# Patient Record
Sex: Female | Born: 1945 | Race: Black or African American | Hispanic: No | Marital: Single | Smoking: Never smoker
Health system: Southern US, Community
[De-identification: ages and names within clinical notes are randomized; demographics above are authoritative.]

## PROBLEM LIST (undated history)

## (undated) DIAGNOSIS — R569 Unspecified convulsions: Secondary | ICD-10-CM

## (undated) DIAGNOSIS — F79 Unspecified intellectual disabilities: Secondary | ICD-10-CM

## (undated) HISTORY — PX: NO PAST SURGERIES: SHX2092

---

## 2003-08-09 ENCOUNTER — Other Ambulatory Visit: Payer: Self-pay

## 2009-01-01 ENCOUNTER — Emergency Department: Payer: Self-pay | Admitting: Emergency Medicine

## 2011-01-02 ENCOUNTER — Emergency Department: Payer: Self-pay | Admitting: Emergency Medicine

## 2013-05-20 ENCOUNTER — Other Ambulatory Visit: Payer: Self-pay

## 2013-05-20 ENCOUNTER — Encounter (HOSPITAL_COMMUNITY): Payer: Self-pay | Admitting: Emergency Medicine

## 2013-05-20 ENCOUNTER — Emergency Department (HOSPITAL_COMMUNITY)
Admission: EM | Admit: 2013-05-20 | Discharge: 2013-05-20 | Disposition: A | Discharge status: Home / Self Care | Payer: Medicare Other | Attending: Emergency Medicine | Admitting: Emergency Medicine

## 2013-05-20 ENCOUNTER — Emergency Department (HOSPITAL_COMMUNITY): Payer: Medicare Other

## 2013-05-20 DIAGNOSIS — Z8669 Personal history of other diseases of the nervous system and sense organs: Secondary | ICD-10-CM | POA: Insufficient documentation

## 2013-05-20 DIAGNOSIS — S8990XA Unspecified injury of unspecified lower leg, initial encounter: Secondary | ICD-10-CM | POA: Insufficient documentation

## 2013-05-20 DIAGNOSIS — I440 Atrioventricular block, first degree: Secondary | ICD-10-CM | POA: Insufficient documentation

## 2013-05-20 DIAGNOSIS — R569 Unspecified convulsions: Secondary | ICD-10-CM | POA: Insufficient documentation

## 2013-05-20 DIAGNOSIS — R220 Localized swelling, mass and lump, head: Secondary | ICD-10-CM | POA: Insufficient documentation

## 2013-05-20 DIAGNOSIS — F79 Unspecified intellectual disabilities: Secondary | ICD-10-CM | POA: Insufficient documentation

## 2013-05-20 DIAGNOSIS — G9389 Other specified disorders of brain: Secondary | ICD-10-CM | POA: Insufficient documentation

## 2013-05-20 DIAGNOSIS — Y92009 Unspecified place in unspecified non-institutional (private) residence as the place of occurrence of the external cause: Secondary | ICD-10-CM | POA: Insufficient documentation

## 2013-05-20 DIAGNOSIS — S99929A Unspecified injury of unspecified foot, initial encounter: Principal | ICD-10-CM

## 2013-05-20 DIAGNOSIS — M79605 Pain in left leg: Secondary | ICD-10-CM

## 2013-05-20 DIAGNOSIS — M2669 Other specified disorders of temporomandibular joint: Secondary | ICD-10-CM | POA: Insufficient documentation

## 2013-05-20 DIAGNOSIS — R7889 Finding of other specified substances, not normally found in blood: Secondary | ICD-10-CM

## 2013-05-20 DIAGNOSIS — S99919A Unspecified injury of unspecified ankle, initial encounter: Principal | ICD-10-CM

## 2013-05-20 DIAGNOSIS — I7090 Unspecified atherosclerosis: Secondary | ICD-10-CM | POA: Insufficient documentation

## 2013-05-20 DIAGNOSIS — R221 Localized swelling, mass and lump, neck: Secondary | ICD-10-CM

## 2013-05-20 DIAGNOSIS — Y939 Activity, unspecified: Secondary | ICD-10-CM | POA: Insufficient documentation

## 2013-05-20 DIAGNOSIS — R892 Abnormal level of other drugs, medicaments and biological substances in specimens from other organs, systems and tissues: Secondary | ICD-10-CM | POA: Insufficient documentation

## 2013-05-20 DIAGNOSIS — W19XXXA Unspecified fall, initial encounter: Secondary | ICD-10-CM | POA: Insufficient documentation

## 2013-05-20 HISTORY — DX: Unspecified intellectual disabilities: F79

## 2013-05-20 HISTORY — DX: Unspecified convulsions: R56.9

## 2013-05-20 LAB — URINALYSIS, ROUTINE W REFLEX MICROSCOPIC
Bilirubin Urine: NEGATIVE
GLUCOSE, UA: NEGATIVE mg/dL
Ketones, ur: NEGATIVE mg/dL
Leukocytes, UA: NEGATIVE
Nitrite: NEGATIVE
Protein, ur: NEGATIVE mg/dL
UROBILINOGEN UA: 0.2 mg/dL (ref 0.0–1.0)
pH: 5.5 (ref 5.0–8.0)

## 2013-05-20 LAB — CBC
HEMATOCRIT: 35.8 % — AB (ref 36.0–46.0)
HEMOGLOBIN: 12 g/dL (ref 12.0–15.0)
MCH: 30.2 pg (ref 26.0–34.0)
MCHC: 33.5 g/dL (ref 30.0–36.0)
MCV: 89.9 fL (ref 78.0–100.0)
Platelets: 193 10*3/uL (ref 150–400)
RBC: 3.98 MIL/uL (ref 3.87–5.11)
RDW: 13.7 % (ref 11.5–15.5)
WBC: 6.1 10*3/uL (ref 4.0–10.5)

## 2013-05-20 LAB — PHENYTOIN LEVEL, TOTAL: PHENYTOIN LVL: 26.5 ug/mL — AB (ref 10.0–20.0)

## 2013-05-20 LAB — BASIC METABOLIC PANEL
BUN: 18 mg/dL (ref 6–23)
CHLORIDE: 103 meq/L (ref 96–112)
CO2: 26 mEq/L (ref 19–32)
Calcium: 9.1 mg/dL (ref 8.4–10.5)
Creatinine, Ser: 0.56 mg/dL (ref 0.50–1.10)
Glucose, Bld: 122 mg/dL — ABNORMAL HIGH (ref 70–99)
POTASSIUM: 3.9 meq/L (ref 3.7–5.3)
SODIUM: 143 meq/L (ref 137–147)

## 2013-05-20 LAB — URINE MICROSCOPIC-ADD ON

## 2013-05-20 NOTE — Discharge Instructions (Signed)
Please have Mariah Jimenez take her Dilantin tonight but instead of her usual dose, please give her 100mg  of dilantin instead.  She can take her normal amount of phenobarbital.  Mariah Jimenez also needs to drink more fluids as she does appear slightly dehydrated  You should return to the ER immediately for severe or worsening symptoms.  [facility] [place] [facility]    Kari BaarsEdward Hawkins MD. Specialty: Pulmonary Disease Contact information: [address]  [address]  [place] [address]  [phone]   Syliva OvermanMargaret Simpson, MD. Specialty: [facility] Contact information: [address]  [place] [address]  [phone]   Lilyan PuntScott Luking, MD. Specialty: [facility] Contact information: [address]  [address]  [place] [address]  [phone]   Avon Gullyesfaye Fanta, MD Specialty: Internal Medicine Contact information: [address]  [place] [address]  [phone]   Catalina PizzaZach Hall, MD. Specialty: Internal Medicine Contact information: [address]  [place] [address]  [phone]   Butch PennyAngus Mcinnis, MD. Specialty: Family Medicine Contact information: [address]  [place] [address]  [phone]   John GiovanniStephen Knowlton, MD. Specialty: [facility] Contact information: [address]  [address]  [place] [address]  [phone]   Carylon Perchesoy Fagan, MD. Specialty: Internal Medicine Contact information: [address]  [address]  [place] [address]  [phone]

## 2013-05-20 NOTE — ED Notes (Signed)
Found on floor at home  c/o L leg pain by sister.  Hx of epilepsy and mentally handicapped.

## 2013-05-20 NOTE — ED Provider Notes (Signed)
CSN: [id number]     Arrival date & time 05/20/13  0246 History   First MD Initiated Contact with Patient 05/20/13 0248     Chief Complaint  Patient presents with  . Fall  . 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 She was found on the floor by family members, there is no family at the time of the patient's arrival, the patient is unable to verbalize what happened, does not even answer yes or no to questions. Possibly with left leg pain the patient is unable to voice any concerns at this time.  Level 5 caveat applies secondary to mental retardation   Patient is a 68 y.o. female presenting with fall and leg pain. The history is provided by the EMS personnel.  Fall  Leg Pain   Past Medical History  Diagnosis Date  . Mental retardation   . Seizures    No past surgical history on file. No family history on file. History  Substance Use Topics  . Smoking status: Not on file  . Smokeless tobacco: Not on file  . Alcohol Use: Not on file   OB History   Grav Para Term Preterm Abortions TAB SAB Ect Mult Living                 Review of Systems  Unable to perform ROS: Other    Allergies  Review of patient's allergies indicates not on file.  Home Medications  No current outpatient prescriptions on file. BP 134/59  Pulse 103  Temp(Src) 97.5 F (36.4 C) (Oral)  Resp 18  Ht 5\' 7"  (1.702 m)  Wt 250 lb (113.399 kg)  BMI 39.15 kg/m2  SpO2 97% Physical Exam  Nursing note and vitals reviewed. Constitutional: She appears well-developed and well-nourished. No distress.  HENT:  Head: Normocephalic and atraumatic.  Mouth/Throat: Oropharynx is clear and moist. No oropharyngeal exudate.  Edentulous, no intraoral bleeding or lesions visualized, no obvious trauma to the face or the scalp, no palpable hematomas or  visualized contusions, no raccoon eyes, no battle sign  Eyes: Conjunctivae and EOM are normal. Pupils are equal, round, and reactive to light. Right eye exhibits no discharge. Left eye exhibits no discharge. No scleral icterus.  Neck: Normal range of motion. Neck supple. No JVD present. No thyromegaly present.  Cardiovascular: Normal rate, regular rhythm, normal heart sounds and intact distal pulses.  Exam reveals no gallop and no friction rub.   No murmur heard. Pulmonary/Chest: Effort normal and breath sounds normal. No respiratory distress. She has no wheezes. She has no rales.  Abdominal: Soft. Bowel sounds are normal. She exhibits no distension and no mass. There is no tenderness.  Musculoskeletal: Normal range of motion. She exhibits no edema and no tenderness.  Slight discrepancy in leg length left shorter than the right, the patient is able to straight leg raise bilaterally with no difficulty symmetrically. No pain with rotation or flexion of the left hip or knee, no obvious injuries to the bilateral upper extremities, compartments are soft, joints are supple  Lymphadenopathy:    She has no cervical adenopathy.  Neurological: She is alert. Coordination normal.  Follows commands, able to move all 4 extremities without difficulty, the patient mumbles occasional words which are inaudible  Skin: Skin is warm and dry. No rash noted. No erythema.  Psychiatric: She has a normal mood and affect. Her behavior is normal.  ED Course  Procedures (including critical care time) Labs Review Labs Reviewed  PHENYTOIN LEVEL, TOTAL - Abnormal; Notable for the following:    Phenytoin Lvl 26.5 (*)    All other components within normal limits  BASIC METABOLIC PANEL - Abnormal; Notable for the following:    Glucose, Bld 122 (*)    All other components within normal limits  CBC - Abnormal; Notable for the following:    HCT 35.8 (*)    All other components within normal limits  URINALYSIS, ROUTINE W  REFLEX MICROSCOPIC - Abnormal; Notable for the following:    APPearance HAZY (*)    Specific Gravity, Urine >1.030 (*)    Hgb urine dipstick MODERATE (*)    All other components within normal limits  URINE MICROSCOPIC-ADD ON - Abnormal; Notable for the following:    Squamous Epithelial / LPF MANY (*)    Bacteria, UA MANY (*)    All other components within normal limits   Imaging Review Dg Hip Complete Left  05/20/2013   CLINICAL DATA:  Found down, hip pain.  EXAM: LEFT HIP - COMPLETE 2+ VIEW  COMPARISON:  None available for comparison at time of study interpretation.  FINDINGS: Femoral heads are well formed and located. Hip joint spaces are intact. Sacroiliac joints are symmetric.  No destructive bony lesions. Included soft tissue planes are non-suspicious.  IMPRESSION: No fracture deformity or dislocation.   Electronically Signed   By: Awilda Metro   On: 05/20/2013 04:27   Ct Head Wo Contrast  05/20/2013   CLINICAL DATA:  Found on floor, left hip pain and headache. History of seizures and mental retardation.  EXAM: CT HEAD WITHOUT CONTRAST  CT CERVICAL SPINE WITHOUT CONTRAST  TECHNIQUE: Multidetector CT imaging of the head and cervical spine was performed following the standard protocol without intravenous contrast. Multiplanar CT image reconstructions of the cervical spine were also generated.  COMPARISON:  None available for comparison at time of study interpretation.  FINDINGS: CT HEAD FINDINGS  Prominent fourth ventricle, with commensurate enlargement of cerebellar folia consistent with global cerebellar volume loss. Supratentorial ventricles and sulci are normal for patient's age. No intraparenchymal hemorrhage, mass effect, midline shift or acute large vascular territory infarct. Minimal white matter changes are less than expected for age of the nonspecific may reflect chronic small vessel ischemic disease.  Basal cisterns are patent. Moderate calcific atherosclerosis of the carotid siphons.  Trace paranasal sinus mucosal thickening without air-fluid levels. Mastoid air cells are well aerated. Moderate left temporomandibular osteoarthrosis. Patient is osteopenic. Ocular globes and orbital contents are nonsuspicious though not tailored for evaluation. No skull fracture. Diffuse calvarial thickening without skull fracture.  CT CERVICAL SPINE FINDINGS  Cervical vertebral bodies and posterior elements appear intact and aligned with straightened cervical lordosis. Mild lower cervical levoscoliosis. Bridging C4-5 and C5-6 ventral osteophyte with fusion of the right C4-5 facets which may be degenerative. C1-2 articulation maintained with moderate arthropathy. No destructive bony lesions. Mild calcific atherosclerosis of the carotid bulbs. Right parasagittal 25 x 18 mm cystic mass within the anterior neck which appears to arise from the right infrahyoid strap muscles, not included on the reformations.  IMPRESSION: CT head:  No acute intracranial process.  Cerebellar volume loss in addition to calvarial thickening suggest sequelae of long-term anti seizure medication.  CT cervical spine: Straightened cervical lordosis without acute fracture nor malalignment. 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   Electronically Signed   By: Awilda Metro   On: 05/20/2013 05:26   Ct Cervical Spine Wo Contrast  05/20/2013   CLINICAL DATA:  Found on floor, left hip pain and headache. History of seizures and mental retardation.  EXAM: CT HEAD WITHOUT CONTRAST  CT CERVICAL SPINE WITHOUT CONTRAST  TECHNIQUE: Multidetector CT imaging of the head and cervical spine was performed following the standard protocol without intravenous contrast. Multiplanar CT image reconstructions of the cervical spine were also generated.  COMPARISON:  None available for comparison at time of study  interpretation.  FINDINGS: CT HEAD FINDINGS  Prominent fourth ventricle, with commensurate enlargement of cerebellar folia consistent with global cerebellar volume loss. Supratentorial ventricles and sulci are normal for patient's age. No intraparenchymal hemorrhage, mass effect, midline shift or acute large vascular territory infarct. Minimal white matter changes are less than expected for age of the nonspecific may reflect chronic small vessel ischemic disease.  Basal cisterns are patent. Moderate calcific atherosclerosis of the carotid siphons. Trace paranasal sinus mucosal thickening without air-fluid levels. Mastoid air cells are well aerated. Moderate left temporomandibular osteoarthrosis. Patient is osteopenic. Ocular globes and orbital contents are nonsuspicious though not tailored for evaluation. No skull fracture. Diffuse calvarial thickening without skull fracture.  CT CERVICAL SPINE FINDINGS  Cervical vertebral bodies and posterior elements appear intact and aligned with straightened cervical lordosis. Mild lower cervical levoscoliosis. Bridging C4-5 and C5-6 ventral osteophyte with fusion of the right C4-5 facets which may be degenerative. C1-2 articulation maintained with moderate arthropathy. No destructive bony lesions. Mild calcific atherosclerosis of the carotid bulbs. Right parasagittal 25 x 18 mm cystic mass within the anterior neck which appears to arise from the right infrahyoid strap muscles, not included on the reformations.  IMPRESSION: CT head:  No acute intracranial process.  Cerebellar volume loss in addition to calvarial thickening suggest sequelae of long-term anti seizure medication.  CT cervical spine: Straightened cervical lordosis without acute fracture nor malalignment. 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   Electronically Signed    By: Awilda Metro   On: 05/20/2013 05:26   Dg Knee Complete 4 Views Left  05/20/2013   CLINICAL DATA:  Trauma and fall.  Left knee pain.  EXAM: LEFT KNEE - COMPLETE 4+ VIEW  COMPARISON:  None.  FINDINGS: There is no evidence of acute fracture or dislocation. No knee joint effusion is identified. There is mild to moderate medial compartment joint space narrowing. No focal lytic or blastic osseous lesion is seen. Soft tissues are unremarkable.  IMPRESSION: No evidence of acute osseous abnormality. Mild to moderate medial compartment joint space loss.   Electronically Signed   By: Sebastian Ache   On: 05/20/2013 04:33    EKG Interpretation   None       MDM   1. Fall   2. Left leg pain   3. Elevated Dilantin level    The patient's baseline is unknown, awaiting family for further information, until that time will image left lower extremity as the patient was complaining of pain and there is a slight leg length discrepancy. As the patient has a altered baseline which may be normal for her we will also it image with CT scan of the brain and cervical spine. Unsure if this was a seizure leading  to a fall, possibly syncope, possibly related to heart block or other abnormality. The patient does not appear to be in distress, she is awake, alert and has a normal cardiovascular exam at this time.  ED ECG REPORT  I personally interpreted this EKG   Date: 05/20/2013   Rate: 96  Rhythm: normal sinus rhythm  QRS Axis: normal  Intervals: PR prolonged  ST/T Wave abnormalities: normal  Conduction Disutrbances:first-degree A-V block   Narrative Interpretation:   Old EKG Reviewed: none available  Discussed with the poison control toxicology center, they recommend decreasing dose to 100 mg at the next evening, recheck Dilantin level this week, stable for discharge as the patient is not acutely symptomatic from her Dilantin level. She does appear slightly dehydrated, she is taking oral fluids without  difficulty, there has been no vomiting, no seizures, no nystagmus, no discoordination. Family states they are comfortable taking her home.  Vida Roller, MD 05/20/13 [phone]

## 2016-04-08 ENCOUNTER — Emergency Department: Payer: Medicare Other

## 2016-04-08 ENCOUNTER — Inpatient Hospital Stay
Admission: EM | Admit: 2016-04-08 | Discharge: 2016-04-15 | DRG: 494 | Disposition: A | Discharge status: Skilled Nursing Facility | Payer: Medicare Other | Attending: Internal Medicine | Admitting: Internal Medicine

## 2016-04-08 DIAGNOSIS — S82251A Displaced comminuted fracture of shaft of right tibia, initial encounter for closed fracture: Secondary | ICD-10-CM | POA: Diagnosis present

## 2016-04-08 DIAGNOSIS — S82201A Unspecified fracture of shaft of right tibia, initial encounter for closed fracture: Secondary | ICD-10-CM

## 2016-04-08 DIAGNOSIS — R262 Difficulty in walking, not elsewhere classified: Secondary | ICD-10-CM

## 2016-04-08 DIAGNOSIS — Z419 Encounter for procedure for purposes other than remedying health state, unspecified: Secondary | ICD-10-CM

## 2016-04-08 DIAGNOSIS — Z79899 Other long term (current) drug therapy: Secondary | ICD-10-CM | POA: Diagnosis not present

## 2016-04-08 DIAGNOSIS — S82209A Unspecified fracture of shaft of unspecified tibia, initial encounter for closed fracture: Secondary | ICD-10-CM | POA: Diagnosis present

## 2016-04-08 DIAGNOSIS — F79 Unspecified intellectual disabilities: Secondary | ICD-10-CM | POA: Diagnosis present

## 2016-04-08 DIAGNOSIS — S82409A Unspecified fracture of shaft of unspecified fibula, initial encounter for closed fracture: Secondary | ICD-10-CM | POA: Diagnosis present

## 2016-04-08 DIAGNOSIS — Y92009 Unspecified place in unspecified non-institutional (private) residence as the place of occurrence of the external cause: Secondary | ICD-10-CM

## 2016-04-08 DIAGNOSIS — G40909 Epilepsy, unspecified, not intractable, without status epilepticus: Secondary | ICD-10-CM | POA: Diagnosis present

## 2016-04-08 DIAGNOSIS — W010XXA Fall on same level from slipping, tripping and stumbling without subsequent striking against object, initial encounter: Secondary | ICD-10-CM | POA: Diagnosis present

## 2016-04-08 DIAGNOSIS — S82451A Displaced comminuted fracture of shaft of right fibula, initial encounter for closed fracture: Secondary | ICD-10-CM | POA: Diagnosis present

## 2016-04-08 DIAGNOSIS — X501XXA Overexertion from prolonged static or awkward postures, initial encounter: Secondary | ICD-10-CM

## 2016-04-08 DIAGNOSIS — S82401A Unspecified fracture of shaft of right fibula, initial encounter for closed fracture: Secondary | ICD-10-CM

## 2016-04-08 NOTE — ED Provider Notes (Signed)
[facility] Emergency Department Provider Note  ____________________________________________  Time seen: Approximately 11:36 PM  I have reviewed the triage vital signs and the nursing notes.   HISTORY  Chief Complaint Fall  Level 5 caveat:  Portions of the history and physical were unable to be obtained due to mental retardation   HPI Mariah EhlersBarbara Jimenez is a 70 y.o. female a history of mental retardation and seizures and presents for evaluation of right lower extremity pain status post mechanical fall. History is gathered from EMS on arrival as patient is unable to provide any history due to her mental retardation and according to EMS this is her baseline. She was ambulating to the bathroom when she slipped and fell. She was unable to stand up and was complaining of right ankle pain. Her caretaker noticed redness and swelling of the right ankle and called the ambulance.  Past Medical History:  Diagnosis Date  . Mental retardation   . Seizures [facility](HCC)     Patient Active Problem List   Diagnosis Date Noted  . Tibia/fibula fracture 04/09/2016    Past Surgical History:  Procedure Laterality Date  . NO PAST SURGERIES      Prior to Admission medications   Not on File    Allergies Patient has no known allergies.  Family History  Problem Relation Age of Onset  . Family history unknown: Yes    Social History Social History  Substance Use Topics  . Smoking status: Never Smoker  . Smokeless tobacco: Not on file  . Alcohol use No    Review of Systems Unable to assess ____________________________________________   PHYSICAL EXAM:  VITAL SIGNS: ED Triage Vitals  Enc Vitals Group     BP 04/08/16 2331 (!) 130/55     Pulse Rate 04/08/16 2331 (!) 107     Resp 04/08/16 2331 16     Temp 04/08/16 2331 98.8 F (37.1 C)     Temp Source 04/08/16 2331 Oral     SpO2 04/08/16 2331 98 %     Weight 04/08/16 2327 250 lb (113.4 kg)     Height 04/08/16  2327 5\' 9"  (1.753 m)     Head Circumference --      Peak Flow --      Pain Score --      Pain Loc --      Pain Edu? --      Excl. in GC? --     Constitutional: Alert, no distress.  HEENT:      Head: Normocephalic and atraumatic.         Eyes: Conjunctivae are normal. Sclera is non-icteric. EOMI. PERRL      Mouth/Throat: Mucous membranes are moist. Drooping lower lip bilaterally      Neck: Supple with no signs of meningismus. Cardiovascular: Regular rate and rhythm. No murmurs, gallops, or rubs. 2+ symmetrical distal pulses are present in all extremities. No JVD. Respiratory: Normal respiratory effort. Lungs are clear to auscultation bilaterally. No wheezes, crackles, or rhonchi.  Gastrointestinal: Soft, non tender, and non distended with positive bowel sounds. No rebound or guarding. Musculoskeletal: Warmth surrounding the distal tib fib area on the Right with crepitus on exam, full painless ROM of the R ankle and hip. Nontender with normal range of motion in all other extremities. No c/t/l spine tenderness Neurologic: Mumbles, face is symmetric, moves all extremities Skin: Skin is warm, dry and intact. No rash noted.  ____________________________________________   LABS (all labs ordered are listed, but  only abnormal results are displayed)  Labs Reviewed  SURGICAL PCR SCREEN - Abnormal; Notable for the following:       Result Value   MRSA, PCR POSITIVE (*)    Staphylococcus aureus POSITIVE (*)    All other components within normal limits  CBC WITH DIFFERENTIAL/PLATELET - Abnormal; Notable for the following:    RBC 3.61 (*)    Hemoglobin 11.1 (*)    HCT 32.2 (*)    All other components within normal limits  COMPREHENSIVE METABOLIC PANEL - Abnormal; Notable for the following:    Glucose, Bld 115 (*)    Albumin 3.3 (*)    Alkaline Phosphatase 216 (*)    All other components within normal limits  APTT  PROTIME-INR  TSH  HEMOGLOBIN A1C  TYPE AND SCREEN    ____________________________________________  EKG  none ____________________________________________  RADIOLOGY  XR  R tib/fib: Comminuted displaced distal tibia and fibular shaft fractures. ____________________________________________   PROCEDURES  Procedure(s) performed: None Procedures Critical Care performed:  None ____________________________________________   INITIAL IMPRESSION / ASSESSMENT AND PLAN / ED COURSE  70 y.o. female a history of mental retardation and seizures and presents for evaluation of right lower extremity pain status post mechanical fall. Patient with warm and redness in her distal right tib-fib with crepitus on palpation. No other traumatic findings. Will attempt to contact family for further details. Will get XR of the RLE.  Clinical Course as of Apr 09 708  Fri Apr 09, 2016  0001 Sister is here who lives with the patient. She tells me the patient went to the bathroom without using her walker when she lost her balance and fell. She has a history of falls. Sr. said that she did not pass out or hit her head, she is not on any blood thinners. She is at her baseline per her sister. The fall was witnessed and mechanical in nature.  [CV]  0103 Patient has distal tibia and fibula fracture. I discussed with Dr. Joice LoftsPoggi who recommended splinting and admit for surgical repair in the am. Hospitalist will be consulted fro admission.  [CV]    Clinical Course User Index [CV] Mariah Sicklearolina Janielle Mittelstadt, MD    Pertinent labs & imaging results that were available during my care of the patient were reviewed by me and considered in my medical decision making (see chart for details).    ____________________________________________   FINAL CLINICAL IMPRESSION(S) / ED DIAGNOSES  Final diagnoses:  Injury caused by twisting  Closed fracture of right tibia and fibula, initial encounter  Tibia/fibula fracture, right, closed, initial encounter      NEW MEDICATIONS STARTED  DURING THIS VISIT:  There are no discharge medications for this patient.    Note:  This document was prepared using Dragon voice recognition software and may include unintentional dictation errors.    Mariah Sicklearolina Colden Samaras, MD 04/09/16 [phone]

## 2016-04-08 NOTE — ED Triage Notes (Signed)
Pt arrives to ED via [facility] from home d/t fall with injury to RIGHT ankle. Pt  has swelling and droop to bottom lip (EMS reports normal at baseline) and hard to understand.Pt has swelling to RIGHT ankle and rotation to the left; no bruising or bleeding noted.

## 2016-04-09 ENCOUNTER — Inpatient Hospital Stay: Payer: Medicare Other

## 2016-04-09 ENCOUNTER — Encounter
Admission: EM | Disposition: A | Discharge status: Skilled Nursing Facility | Payer: Self-pay | Source: Home / Self Care | Attending: Internal Medicine

## 2016-04-09 ENCOUNTER — Emergency Department: Payer: Medicare Other

## 2016-04-09 ENCOUNTER — Inpatient Hospital Stay: Payer: Medicare Other | Admitting: Anesthesiology

## 2016-04-09 ENCOUNTER — Encounter: Payer: Self-pay | Admitting: Internal Medicine

## 2016-04-09 DIAGNOSIS — Z79899 Other long term (current) drug therapy: Secondary | ICD-10-CM | POA: Diagnosis not present

## 2016-04-09 DIAGNOSIS — S82409A Unspecified fracture of shaft of unspecified fibula, initial encounter for closed fracture: Secondary | ICD-10-CM | POA: Diagnosis present

## 2016-04-09 DIAGNOSIS — W010XXA Fall on same level from slipping, tripping and stumbling without subsequent striking against object, initial encounter: Secondary | ICD-10-CM | POA: Diagnosis present

## 2016-04-09 DIAGNOSIS — S82251A Displaced comminuted fracture of shaft of right tibia, initial encounter for closed fracture: Secondary | ICD-10-CM | POA: Diagnosis present

## 2016-04-09 DIAGNOSIS — Y92009 Unspecified place in unspecified non-institutional (private) residence as the place of occurrence of the external cause: Secondary | ICD-10-CM | POA: Diagnosis not present

## 2016-04-09 DIAGNOSIS — R262 Difficulty in walking, not elsewhere classified: Secondary | ICD-10-CM | POA: Diagnosis present

## 2016-04-09 DIAGNOSIS — G40909 Epilepsy, unspecified, not intractable, without status epilepticus: Secondary | ICD-10-CM | POA: Diagnosis present

## 2016-04-09 DIAGNOSIS — F79 Unspecified intellectual disabilities: Secondary | ICD-10-CM | POA: Diagnosis present

## 2016-04-09 DIAGNOSIS — S82209A Unspecified fracture of shaft of unspecified tibia, initial encounter for closed fracture: Secondary | ICD-10-CM | POA: Diagnosis present

## 2016-04-09 DIAGNOSIS — S82451A Displaced comminuted fracture of shaft of right fibula, initial encounter for closed fracture: Secondary | ICD-10-CM | POA: Diagnosis present

## 2016-04-09 HISTORY — PX: TIBIA IM NAIL INSERTION: SHX2516

## 2016-04-09 LAB — CBC WITH DIFFERENTIAL/PLATELET
BASOS PCT: 0 %
Basophils Absolute: 0 10*3/uL (ref 0–0.1)
EOS ABS: 0.1 10*3/uL (ref 0–0.7)
EOS PCT: 2 %
HCT: 32.2 % — ABNORMAL LOW (ref 35.0–47.0)
Hemoglobin: 11.1 g/dL — ABNORMAL LOW (ref 12.0–16.0)
Lymphocytes Relative: 17 %
Lymphs Abs: 1.3 10*3/uL (ref 1.0–3.6)
MCH: 30.7 pg (ref 26.0–34.0)
MCHC: 34.4 g/dL (ref 32.0–36.0)
MCV: 89 fL (ref 80.0–100.0)
MONO ABS: 0.3 10*3/uL (ref 0.2–0.9)
MONOS PCT: 4 %
NEUTROS PCT: 77 %
Neutro Abs: 5.7 10*3/uL (ref 1.4–6.5)
PLATELETS: 190 10*3/uL (ref 150–440)
RBC: 3.61 MIL/uL — ABNORMAL LOW (ref 3.80–5.20)
RDW: 13.9 % (ref 11.5–14.5)
WBC: 7.5 10*3/uL (ref 3.6–11.0)

## 2016-04-09 LAB — COMPREHENSIVE METABOLIC PANEL
ALBUMIN: 3.3 g/dL — AB (ref 3.5–5.0)
ALT: 18 U/L (ref 14–54)
ANION GAP: 6 (ref 5–15)
AST: 26 U/L (ref 15–41)
Alkaline Phosphatase: 216 U/L — ABNORMAL HIGH (ref 38–126)
BUN: 12 mg/dL (ref 6–20)
CO2: 24 mmol/L (ref 22–32)
Calcium: 8.9 mg/dL (ref 8.9–10.3)
Chloride: 108 mmol/L (ref 101–111)
Creatinine, Ser: 0.59 mg/dL (ref 0.44–1.00)
GFR calc Af Amer: >60 mL/min (ref >60)
GFR calc non Af Amer: >60 mL/min (ref >60)
GLUCOSE: 115 mg/dL — AB (ref 65–99)
POTASSIUM: 4.2 mmol/L (ref 3.5–5.1)
SODIUM: 138 mmol/L (ref 135–145)
TOTAL PROTEIN: 8 g/dL (ref 6.5–8.1)
Total Bilirubin: 0.5 mg/dL (ref 0.3–1.2)

## 2016-04-09 LAB — SURGICAL PCR SCREEN
MRSA, PCR: POSITIVE — AB
STAPHYLOCOCCUS AUREUS: POSITIVE — AB

## 2016-04-09 LAB — APTT: aPTT: 25 seconds (ref 24–36)

## 2016-04-09 LAB — TYPE AND SCREEN
ABO/RH(D): B POS
ANTIBODY SCREEN: NEGATIVE

## 2016-04-09 LAB — PROTIME-INR
INR: 1.11
Prothrombin Time: 14.4 seconds (ref 11.4–15.2)

## 2016-04-09 LAB — TSH: TSH: 2.034 u[IU]/mL (ref 0.350–4.500)

## 2016-04-09 SURGERY — INSERTION, INTRAMEDULLARY ROD, TIBIA
Anesthesia: General | Site: Leg Lower | Laterality: Right | Wound class: Clean

## 2016-04-09 MED ORDER — ACETAMINOPHEN 10 MG/ML IV SOLN
INTRAVENOUS | Status: DC | PRN
  Administered 2016-04-09: 1000 mg via INTRAVENOUS

## 2016-04-09 MED ORDER — FENTANYL CITRATE (PF) 100 MCG/2ML IJ SOLN
50.0000 ug | Freq: Once | INTRAMUSCULAR | Status: AC
Start: 2016-04-09 — End: 2016-04-09
  Administered 2016-04-09: 50 ug via INTRAMUSCULAR
  Filled 2016-04-09: qty 2

## 2016-04-09 MED ORDER — LIDOCAINE HCL (CARDIAC) 20 MG/ML IV SOLN
INTRAVENOUS | Status: DC | PRN
  Administered 2016-04-09: 100 mg via INTRAVENOUS

## 2016-04-09 MED ORDER — ACETAMINOPHEN 325 MG PO TABS: 650.0000 mg | ORAL_TABLET | Freq: Four times a day (QID) | ORAL | Status: DC | PRN

## 2016-04-09 MED ORDER — ONDANSETRON HCL 4 MG/2ML IJ SOLN
4.0000 mg | Freq: Four times a day (QID) | INTRAMUSCULAR | Status: DC | PRN
  Administered 2016-04-09: 4 mg via INTRAVENOUS

## 2016-04-09 MED ORDER — ENOXAPARIN SODIUM 40 MG/0.4ML ~~LOC~~ SOLN
40.0000 mg | SUBCUTANEOUS | Status: DC
  Administered 2016-04-10 – 2016-04-15 (×6): 40 mg via SUBCUTANEOUS
  Filled 2016-04-09 (×6): qty 0.4

## 2016-04-09 MED ORDER — FENTANYL CITRATE (PF) 100 MCG/2ML IJ SOLN
INTRAMUSCULAR | Status: AC
  Filled 2016-04-09: qty 2

## 2016-04-09 MED ORDER — ACETAMINOPHEN 10 MG/ML IV SOLN
INTRAVENOUS | Status: AC
  Filled 2016-04-09: qty 100

## 2016-04-09 MED ORDER — ROCURONIUM BROMIDE 50 MG/5ML IV SOSY
PREFILLED_SYRINGE | INTRAVENOUS | Status: AC
  Filled 2016-04-09: qty 5

## 2016-04-09 MED ORDER — FENTANYL CITRATE (PF) 100 MCG/2ML IJ SOLN
INTRAMUSCULAR | Status: DC | PRN
  Administered 2016-04-09: 25 ug via INTRAVENOUS
  Administered 2016-04-09 (×2): 75 ug via INTRAVENOUS
  Administered 2016-04-09: 25 ug via INTRAVENOUS

## 2016-04-09 MED ORDER — ROCURONIUM BROMIDE 100 MG/10ML IV SOLN
INTRAVENOUS | Status: DC | PRN
  Administered 2016-04-09: 10 mg via INTRAVENOUS
  Administered 2016-04-09: 5 mg via INTRAVENOUS
  Administered 2016-04-09: 10 mg via INTRAVENOUS
  Administered 2016-04-09: 20 mg via INTRAVENOUS
  Administered 2016-04-09: 45 mg via INTRAVENOUS

## 2016-04-09 MED ORDER — PROPOFOL 10 MG/ML IV BOLUS
INTRAVENOUS | Status: DC | PRN
  Administered 2016-04-09: 70 mg via INTRAVENOUS
  Administered 2016-04-09: 130 mg via INTRAVENOUS

## 2016-04-09 MED ORDER — CEFAZOLIN SODIUM-DEXTROSE 2-4 GM/100ML-% IV SOLN: 2.0000 g | INTRAVENOUS | Status: DC

## 2016-04-09 MED ORDER — SODIUM CHLORIDE 0.9% FLUSH
3.0000 mL | Freq: Two times a day (BID) | INTRAVENOUS | Status: DC
  Administered 2016-04-10 – 2016-04-14 (×8): 3 mL via INTRAVENOUS

## 2016-04-09 MED ORDER — SUCCINYLCHOLINE CHLORIDE 20 MG/ML IJ SOLN
INTRAMUSCULAR | Status: DC | PRN
  Administered 2016-04-09: 100 mg via INTRAVENOUS

## 2016-04-09 MED ORDER — PHENOBARBITAL 30 MG PO TABS: 30.0000 mg | ORAL_TABLET | Freq: Every day | ORAL | Status: DC

## 2016-04-09 MED ORDER — LACTATED RINGERS IV SOLN
INTRAVENOUS | Status: DC | PRN
  Administered 2016-04-09 (×2): via INTRAVENOUS

## 2016-04-09 MED ORDER — PHENYTOIN SODIUM EXTENDED 100 MG PO CAPS
300.0000 mg | ORAL_CAPSULE | Freq: Every day | ORAL | Status: DC
  Administered 2016-04-09 – 2016-04-14 (×6): 300 mg via ORAL
  Filled 2016-04-09 (×7): qty 3

## 2016-04-09 MED ORDER — OXYCODONE HCL 5 MG PO TABS
5.0000 mg | ORAL_TABLET | ORAL | Status: DC | PRN
  Administered 2016-04-10 – 2016-04-14 (×10): 5 mg via ORAL
  Filled 2016-04-09: qty 1
  Filled 2016-04-09: qty 5
  Filled 2016-04-09 (×9): qty 1

## 2016-04-09 MED ORDER — FLEET ENEMA 7-19 GM/118ML RE ENEM: 1.0000 | ENEMA | Freq: Once | RECTAL | Status: DC | PRN

## 2016-04-09 MED ORDER — CHLORHEXIDINE GLUCONATE CLOTH 2 % EX PADS
6.0000 | MEDICATED_PAD | Freq: Every day | CUTANEOUS | Status: AC
  Administered 2016-04-11 – 2016-04-12 (×3): 6 via TOPICAL

## 2016-04-09 MED ORDER — ONDANSETRON HCL 4 MG PO TABS: 4.0000 mg | ORAL_TABLET | Freq: Four times a day (QID) | ORAL | Status: DC | PRN

## 2016-04-09 MED ORDER — PHENOBARBITAL 32.4 MG PO TABS
32.4000 mg | ORAL_TABLET | Freq: Every day | ORAL | Status: DC
  Administered 2016-04-10 – 2016-04-14 (×6): 32.4 mg via ORAL
  Filled 2016-04-09 (×6): qty 1

## 2016-04-09 MED ORDER — PROPOFOL 10 MG/ML IV BOLUS
INTRAVENOUS | Status: AC
  Filled 2016-04-09: qty 20

## 2016-04-09 MED ORDER — DIPHENHYDRAMINE HCL 12.5 MG/5ML PO ELIX
12.5000 mg | ORAL_SOLUTION | ORAL | Status: DC | PRN
  Administered 2016-04-14 – 2016-04-15 (×2): 25 mg via ORAL
  Filled 2016-04-09 (×2): qty 10

## 2016-04-09 MED ORDER — DEXTROSE-NACL 5-0.9 % IV SOLN
INTRAVENOUS | Status: DC
  Administered 2016-04-09: 1000 mL via INTRAVENOUS
  Administered 2016-04-10: 02:00:00 via INTRAVENOUS

## 2016-04-09 MED ORDER — METOCLOPRAMIDE HCL 5 MG/ML IJ SOLN: 5.0000 mg | Freq: Three times a day (TID) | INTRAMUSCULAR | Status: DC | PRN

## 2016-04-09 MED ORDER — PHENYLEPHRINE HCL 10 MG/ML IJ SOLN
INTRAMUSCULAR | Status: DC | PRN
  Administered 2016-04-09: 100 ug via INTRAVENOUS
  Administered 2016-04-09 (×2): 50 ug via INTRAVENOUS
  Administered 2016-04-09 (×2): 100 ug via INTRAVENOUS
  Administered 2016-04-09 (×2): 50 ug via INTRAVENOUS
  Administered 2016-04-09: 100 ug via INTRAVENOUS

## 2016-04-09 MED ORDER — SODIUM CHLORIDE 0.9 % IV BOLUS (SEPSIS)
1000.0000 mL | Freq: Once | INTRAVENOUS | Status: AC
  Administered 2016-04-09: 1000 mL via INTRAVENOUS

## 2016-04-09 MED ORDER — ENOXAPARIN SODIUM 40 MG/0.4ML ~~LOC~~ SOLN: 40.0000 mg | SUBCUTANEOUS | Status: DC

## 2016-04-09 MED ORDER — VANCOMYCIN HCL IN DEXTROSE 1-5 GM/200ML-% IV SOLN: 1000.0000 mg | Freq: Two times a day (BID) | INTRAVENOUS | Status: AC

## 2016-04-09 MED ORDER — SUGAMMADEX SODIUM 200 MG/2ML IV SOLN
INTRAVENOUS | Status: DC | PRN
  Administered 2016-04-09: 226.8 mg via INTRAVENOUS

## 2016-04-09 MED ORDER — ONDANSETRON HCL 4 MG/2ML IJ SOLN: 4.0000 mg | Freq: Once | INTRAMUSCULAR | Status: DC | PRN

## 2016-04-09 MED ORDER — MAGNESIUM HYDROXIDE 400 MG/5ML PO SUSP: 30.0000 mL | Freq: Every day | ORAL | Status: DC | PRN

## 2016-04-09 MED ORDER — LORAZEPAM 2 MG/ML IJ SOLN
2.0000 mg | INTRAMUSCULAR | Status: DC | PRN
Start: 2016-04-09 — End: 2016-04-15

## 2016-04-09 MED ORDER — ACETAMINOPHEN 650 MG RE SUPP: 650.0000 mg | Freq: Four times a day (QID) | RECTAL | Status: DC | PRN

## 2016-04-09 MED ORDER — DEXAMETHASONE SODIUM PHOSPHATE 10 MG/ML IJ SOLN
INTRAMUSCULAR | Status: AC
  Filled 2016-04-09: qty 1

## 2016-04-09 MED ORDER — PANTOPRAZOLE SODIUM 40 MG PO TBEC
40.0000 mg | DELAYED_RELEASE_TABLET | Freq: Every day | ORAL | Status: DC
  Administered 2016-04-10 – 2016-04-15 (×6): 40 mg via ORAL
  Filled 2016-04-09 (×6): qty 1

## 2016-04-09 MED ORDER — VANCOMYCIN HCL IN DEXTROSE 1-5 GM/200ML-% IV SOLN
1000.0000 mg | Freq: Once | INTRAVENOUS | Status: AC
  Administered 2016-04-09: 1000 mg via INTRAVENOUS

## 2016-04-09 MED ORDER — METOCLOPRAMIDE HCL 10 MG PO TABS: 5.0000 mg | ORAL_TABLET | Freq: Three times a day (TID) | ORAL | Status: DC | PRN

## 2016-04-09 MED ORDER — BISACODYL 10 MG RE SUPP: 10.0000 mg | Freq: Every day | RECTAL | Status: DC | PRN

## 2016-04-09 MED ORDER — DEXAMETHASONE SODIUM PHOSPHATE 10 MG/ML IJ SOLN
INTRAMUSCULAR | Status: DC | PRN
  Administered 2016-04-09: 4 mg via INTRAVENOUS

## 2016-04-09 MED ORDER — SODIUM CHLORIDE 0.9 % IV SOLN
Freq: Once | INTRAVENOUS | Status: AC
  Administered 2016-04-09: 04:00:00 via INTRAVENOUS

## 2016-04-09 MED ORDER — SUGAMMADEX SODIUM 200 MG/2ML IV SOLN
INTRAVENOUS | Status: AC
  Filled 2016-04-09: qty 2

## 2016-04-09 MED ORDER — HYDROMORPHONE HCL 1 MG/ML IJ SOLN: 1.0000 mg | INTRAMUSCULAR | Status: DC | PRN

## 2016-04-09 MED ORDER — VANCOMYCIN HCL IN DEXTROSE 1-5 GM/200ML-% IV SOLN
INTRAVENOUS | Status: AC
  Filled 2016-04-09: qty 200

## 2016-04-09 MED ORDER — DOCUSATE SODIUM 100 MG PO CAPS
100.0000 mg | ORAL_CAPSULE | Freq: Two times a day (BID) | ORAL | Status: DC
  Administered 2016-04-09 – 2016-04-14 (×11): 100 mg via ORAL
  Filled 2016-04-09 (×11): qty 1

## 2016-04-09 MED ORDER — ACETAMINOPHEN 500 MG PO TABS
1000.0000 mg | ORAL_TABLET | Freq: Four times a day (QID) | ORAL | Status: AC
  Administered 2016-04-09 – 2016-04-10 (×4): 1000 mg via ORAL
  Filled 2016-04-09 (×4): qty 2

## 2016-04-09 MED ORDER — VANCOMYCIN HCL 10 G IV SOLR
1500.0000 mg | Freq: Once | INTRAVENOUS | Status: AC
  Administered 2016-04-09: 1500 mg via INTRAVENOUS
  Filled 2016-04-09: qty 1500

## 2016-04-09 MED ORDER — ONDANSETRON HCL 4 MG/2ML IJ SOLN
INTRAMUSCULAR | Status: AC
  Filled 2016-04-09: qty 2

## 2016-04-09 MED ORDER — LIDOCAINE 2% (20 MG/ML) 5 ML SYRINGE
INTRAMUSCULAR | Status: AC
  Filled 2016-04-09: qty 5

## 2016-04-09 MED ORDER — ONDANSETRON HCL 4 MG/2ML IJ SOLN: 4.0000 mg | Freq: Four times a day (QID) | INTRAMUSCULAR | Status: DC | PRN

## 2016-04-09 MED ORDER — MUPIROCIN 2 % EX OINT
1.0000 "application " | TOPICAL_OINTMENT | Freq: Two times a day (BID) | CUTANEOUS | Status: AC
  Administered 2016-04-09 – 2016-04-13 (×10): 1 via NASAL
  Filled 2016-04-09: qty 22

## 2016-04-09 MED ORDER — SODIUM CHLORIDE 0.9 % IV BOLUS (SEPSIS): 125.0000 mL | Freq: Once | INTRAVENOUS | Status: DC

## 2016-04-09 MED ORDER — DOCUSATE SODIUM 100 MG PO CAPS: 100.0000 mg | ORAL_CAPSULE | Freq: Two times a day (BID) | ORAL | Status: DC

## 2016-04-09 MED ORDER — LEVETIRACETAM 750 MG PO TABS
1500.0000 mg | ORAL_TABLET | Freq: Two times a day (BID) | ORAL | Status: DC
  Administered 2016-04-10 – 2016-04-15 (×11): 1500 mg via ORAL
  Filled 2016-04-09 (×11): qty 2

## 2016-04-09 MED ORDER — ACETAMINOPHEN 325 MG PO TABS
650.0000 mg | ORAL_TABLET | Freq: Four times a day (QID) | ORAL | Status: DC | PRN
  Administered 2016-04-11 – 2016-04-12 (×2): 650 mg via ORAL
  Filled 2016-04-09 (×2): qty 2

## 2016-04-09 MED ORDER — FENTANYL CITRATE (PF) 100 MCG/2ML IJ SOLN: 50.0000 ug | Freq: Once | INTRAMUSCULAR | Status: DC

## 2016-04-09 MED ORDER — NEOMYCIN-POLYMYXIN B GU 40-200000 IR SOLN
Status: DC | PRN
  Administered 2016-04-09: 4 mL

## 2016-04-09 MED ORDER — FENTANYL CITRATE (PF) 100 MCG/2ML IJ SOLN
25.0000 ug | INTRAMUSCULAR | Status: DC | PRN
  Administered 2016-04-09 (×2): 25 ug via INTRAVENOUS

## 2016-04-09 SURGICAL SUPPLY — 50 items
BANDAGE ACE 4X5 VEL STRL LF (GAUZE/BANDAGES/DRESSINGS) IMPLANT
BANDAGE ACE 6X5 VEL STRL LF (GAUZE/BANDAGES/DRESSINGS) IMPLANT
BIT DRILL 3.8X6 NS (BIT) ×4 IMPLANT
BIT DRILL 4.4 NS (BIT) ×2 IMPLANT
BLADE SURG SZ10 CARB STEEL (BLADE) ×4 IMPLANT
BNDG ESMARK 6X12 TAN STRL LF (GAUZE/BANDAGES/DRESSINGS) ×2 IMPLANT
BNDG PLASTER FAST 4X5 WHT LF (CAST SUPPLIES) IMPLANT
BNDG PLASTER FAST 6X5 WHT LF (CAST SUPPLIES) IMPLANT
CANISTER SUCT 1200ML W/VALVE (MISCELLANEOUS) ×2 IMPLANT
CHLORAPREP W/TINT 26ML (MISCELLANEOUS) ×4 IMPLANT
CUFF TOURN 24 STER (MISCELLANEOUS) IMPLANT
CUFF TOURN 30 STER DUAL PORT (MISCELLANEOUS) ×2 IMPLANT
DRAPE C-ARM XRAY 36X54 (DRAPES) ×2 IMPLANT
DRAPE C-ARMOR (DRAPES) ×2 IMPLANT
DRAPE SHEET LG 3/4 BI-LAMINATE (DRAPES) ×4 IMPLANT
DRAPE TABLE BACK 80X90 (DRAPES) ×2 IMPLANT
DRAPE U-SHAPE 47X51 STRL (DRAPES) ×2 IMPLANT
ELECT CAUTERY BLADE TIP 2.5 (TIP) ×2
ELECT REM PT RETURN 9FT ADLT (ELECTROSURGICAL) ×2
ELECTRODE CAUTERY BLDE TIP 2.5 (TIP) ×1 IMPLANT
ELECTRODE REM PT RTRN 9FT ADLT (ELECTROSURGICAL) ×1 IMPLANT
GAUZE PETRO XEROFOAM 1X8 (MISCELLANEOUS) ×2 IMPLANT
GAUZE SPONGE 4X4 12PLY STRL (GAUZE/BANDAGES/DRESSINGS) IMPLANT
GLOVE BIO SURGEON STRL SZ8 (GLOVE) ×4 IMPLANT
GLOVE INDICATOR 8.0 STRL GRN (GLOVE) ×2 IMPLANT
GLOVE SURG XRAY 8.0 LX (GLOVE) ×2 IMPLANT
GOWN STRL REUS W/ TWL LRG LVL3 (GOWN DISPOSABLE) ×1 IMPLANT
GOWN STRL REUS W/ TWL XL LVL3 (GOWN DISPOSABLE) ×1 IMPLANT
GOWN STRL REUS W/TWL LRG LVL3 (GOWN DISPOSABLE) ×1
GOWN STRL REUS W/TWL XL LVL3 (GOWN DISPOSABLE) ×1
GUIDEPIN 3.2X17.5 THRD DISP (PIN) ×2 IMPLANT
GUIDEWIRE BALL NOSE 80CM (WIRE) ×2 IMPLANT
KIT RM TURNOVER STRD PROC AR (KITS) ×2 IMPLANT
NAIL TIBIAL 12MM X 34.5CM (Nail) ×2 IMPLANT
NS IRRIG 1000ML POUR BTL (IV SOLUTION) ×2 IMPLANT
PAD ABD DERMACEA PRESS 5X9 (GAUZE/BANDAGES/DRESSINGS) IMPLANT
PADDING CAST BLEND 4X4 NS (MISCELLANEOUS) ×6 IMPLANT
SCREW ACE CAP BN (Screw) ×1 IMPLANT
SCREW ACECAP 42MM (Screw) ×2 IMPLANT
SCREW ACECAP 46MM (Screw) ×2 IMPLANT
SCREW BN OBLQ FT 54X4.5XST 2 (Screw) ×1 IMPLANT
SCREW PROXIMAL DEPUY (Screw) ×3 IMPLANT
SCREW PRXML FT 45X5.5XLCK NS (Screw) ×1 IMPLANT
SCREW PRXML FT 55X5.5XNS TIB (Screw) ×1 IMPLANT
SCREW PRXML FT 65X5.5XNS CORT (Screw) ×1 IMPLANT
SPONGE LAP 18X18 5 PK (GAUZE/BANDAGES/DRESSINGS) ×4 IMPLANT
STAPLER SKIN PROX 35W (STAPLE) ×2 IMPLANT
STRAP SAFETY BODY (MISCELLANEOUS) ×2 IMPLANT
SUT VIC AB 0 CT1 36 (SUTURE) ×2 IMPLANT
SUT VIC AB 2-0 CT1 (SUTURE) ×2 IMPLANT

## 2016-04-09 NOTE — Anesthesia Procedure Notes (Signed)
Procedure Name: Intubation Performed by: Casey BurkittHOANG, Rowen Hur Pre-anesthesia Checklist: Patient identified, Patient being monitored, Timeout performed, Emergency Drugs available and Suction available Patient Re-evaluated:Patient Re-evaluated prior to inductionOxygen Delivery Method: Circle system utilized Preoxygenation: Pre-oxygenation with 100% oxygen Intubation Type: IV induction and Cricoid Pressure applied Ventilation: Mask ventilation without difficulty Laryngoscope Size: Mac and 3 Grade View: Grade I Tube type: Oral Tube size: 7.0 mm Number of attempts: 1 Airway Equipment and Method: Stylet Placement Confirmation: ETT inserted through vocal cords under direct vision,  positive ETCO2 and breath sounds checked- equal and bilateral Secured at: 22 cm Tube secured with: Tape Dental Injury: Teeth and Oropharynx as per pre-operative assessment

## 2016-04-09 NOTE — Progress Notes (Signed)
Patient back from surgery. Patient is alert. Family at bedside feeding her jello/ orange juice. Patient not experiencing nausea/vomiting. Patient unable to express if she feels sensation in right leg or pain. Patient unable to wiggle toes on right leg.   Harvie HeckMelanie Bartow Zylstra, RN

## 2016-04-09 NOTE — Anesthesia Preprocedure Evaluation (Addendum)
Anesthesia Evaluation  Patient identified by MRN, date of birth, ID band Patient awake    Reviewed: Allergy & Precautions, NPO status , Patient's Chart, lab work & pertinent test results  Airway Mallampati: II       Dental  (+) Edentulous Upper, Edentulous Lower   Pulmonary neg pulmonary ROS,    Pulmonary exam normal        Cardiovascular negative cardio ROS Normal cardiovascular exam     Neuro/Psych Seizures -, Well Controlled,  PSYCHIATRIC DISORDERS Developmental delay   GI/Hepatic negative GI ROS, Neg liver ROS,   Endo/Other  negative endocrine ROS  Renal/GU negative Renal ROS  negative genitourinary   Musculoskeletal negative musculoskeletal ROS (+)   Abdominal Normal abdominal exam  (+)   Peds negative pediatric ROS (+)  Hematology negative hematology ROS (+)   Anesthesia Other Findings   Reproductive/Obstetrics                            Anesthesia Physical Anesthesia Plan  ASA: III  Anesthesia Plan: General   Post-op Pain Management:    Induction: Intravenous  Airway Management Planned: Oral ETT  Additional Equipment:   Intra-op Plan:   Post-operative Plan: Extubation in OR  Informed Consent: I have reviewed the patients History and Physical, chart, labs and discussed the procedure including the risks, benefits and alternatives for the proposed anesthesia with the patient or authorized representative who has indicated his/her understanding and acceptance.   Dental advisory given  Plan Discussed with: CRNA and Surgeon  Anesthesia Plan Comments:         Anesthesia Quick Evaluation

## 2016-04-09 NOTE — Progress Notes (Addendum)
[facility] arrived- Patient left the unit via bed and transported to the OR for surgery.  CCMD called and made aware that patient has left the unit and gone to surgery.

## 2016-04-09 NOTE — Progress Notes (Signed)
Clarified with Dr. Joice LoftsPoggi, wants vancomycin 1 gram pre Operatively.

## 2016-04-09 NOTE — Progress Notes (Signed)
Patient returned from PACU- [facility]- [facility] given to  [place] at this time.

## 2016-04-09 NOTE — Progress Notes (Signed)
Pt. Has positive PCR. Contact isolation orders put in for pt. And pt. Put on contact isolation.

## 2016-04-09 NOTE — Consult Note (Signed)
ORTHOPAEDIC CONSULTATION  REQUESTING PHYSICIAN: Alford Highlandichard Wieting, MD  Chief Complaint:   Right lower leg injury.  History of Present Illness: Mariah Jimenez is a 70 y.o. female with a history of mental retardation and seizure disorder who lives with her sister and her sister's husband.  The patient normally ambulates with a walker.  She was in her usual state of health until last evening when she apparently lost her balance and fell, injuring her right lower leg.  She was brought to the emergency room where x-rays demonstrated a displaced fracture of her right tibia and fibula.  She was placed into a long-leg posterior splint with a sugar tong supplement and admitted for further evaluation and treatment.  According to the patient's sister, there was no evidence for any associated injuries resulting from the fall.  She did not appear to have sustained a seizure to cause the fall.  Past Medical History:  Diagnosis Date  . Mental retardation   . Seizures (HCC)    Past Surgical History:  Procedure Laterality Date  . NO PAST SURGERIES     Social History   Social History  . Marital status: Single    Spouse name: N/A  . Number of children: N/A  . Years of education: N/A   Social History Main Topics  . Smoking status: Never Smoker  . Smokeless tobacco: Never Used  . Alcohol use No  . Drug use: No  . Sexual activity: Not Asked   Other Topics Concern  . None   Social History Narrative  . None   Family History  Problem Relation Age of Onset  . Family history unknown: Yes   No Known Allergies Prior to Admission medications   Not on File   Dg Tibia/fibula Right  Result Date: 04/09/2016 CLINICAL DATA:  Fall today with injury to ankle in right lower leg. Pain. EXAM: RIGHT TIBIA AND FIBULA - 2 VIEW COMPARISON:  None. FINDINGS: Comminuted displaced fractures of the distal tibia and fibular diaphysis. There is 14 mm  lateral displacement of distal tibial fragment and 8 mm lateral displacement of distal fibular fragment. Mild apex posterior angulation of the tibial fracture. Knee alignment is maintained. Diffuse soft tissue edema. IMPRESSION: Comminuted displaced distal tibia and fibular shaft fractures. Electronically Signed   By: Rubye OaksMelanie  Ehinger M.D.   On: 04/09/2016 00:01   Dg Ankle Complete Right  Result Date: 04/09/2016 CLINICAL DATA:  Fall today injuring right ankle and lower leg. Twisting injury. EXAM: RIGHT ANKLE - COMPLETE 3+ VIEW COMPARISON:  None. FINDINGS: Comminuted displaced fractures of the distal tibia and fibular diaphysis. There is 14 mm lateral displacement of distal tibial fragment and 8 mm lateral displacement of distal fibular fragment. Mild apex posterior angulation of the tibial fracture. No extension to the ankle joint. Ankle mortise is preserved. Diffuse soft tissue edema. IMPRESSION: Comminuted displaced distal tibia and fibular shaft fractures. No intra-articular extension. Electronically Signed   By: Rubye OaksMelanie  Ehinger M.D.   On: 04/09/2016 00:00   Dg Tibia/fibula Right Port  Result Date: 04/09/2016 CLINICAL DATA:  Distal tibia fracture, postreduction. EXAM: PORTABLE RIGHT TIBIA AND FIBULA - 2 VIEW COMPARISON:  Pre reduction radiographs 2 hours prior. FINDINGS: Grossly unchanged alignment of comminuted distal tibia and fibular shaft fractures compared to pre reduction views. Persistent angulation and displacement. Overlying cast material in place. IMPRESSION: Grossly unchanged alignment of comminuted angulated distal tibia and fibular fractures. Electronically Signed   By: Rubye OaksMelanie  Ehinger M.D.   On: 04/09/2016 01:49  Positive ROS: All other systems have been reviewed and were otherwise negative with the exception of those mentioned in the HPI and as above.  Physical Exam: General:  Alert, no acute distress Psychiatric:  Patient is competent for consent with normal mood and affect    Cardiovascular:  No pedal edema Respiratory:  No wheezing, non-labored breathing GI:  Abdomen is soft and non-tender Skin:  No lesions in the area of chief complaint Neurologic:  Sensation intact distally Lymphatic:  No axillary or cervical lymphadenopathy  Orthopedic Exam:  Orthopedic examination is limited to the right lower extremity and foot.  The patient is in a well molded long-leg posterior splint with a sugar tong supplement.  The splint appears to be fitting well.  The patient is able to wiggle her toes and there does not appear to be pain with passive toe dorsiflexion.  Sensation appears intact to light touch to her toes.  She has good capillary refill to her toes.  X-rays:  X-rays of the right tibia and fibula are available for review.  These films demonstrate a mildly comminuted fracture of the mid/distal third junction of the right tibia and the mid shaft of the right fibula with displacement.  No significant degenerative changes of either the knee or ankle are noted.  No lytic lesions or other bony abnormalities are identified.  Assessment: Displaced closed right tib-fib fracture.  Plan: The treatment options are discussed with the patient and her sister, who is the patient's primary healthcare proxy, including both surgical and nonsurgical options.  The family would like to proceed with surgical intervention to include reduction and intramedullary nailing of the right tibia fracture.  This procedure has been discussed in detail with the patient and her family, as have the potential risks (including bleeding, infection, nerve and/or blood vessel injury, persistent or recurrent pain, stiffness, malunion and/or nonunion, need for further surgery, blood clots, strokes, heart attacks and/or arrhythmias, etc.) and benefits.  The patient's family states their understanding and wish to proceed.  A formal written consent will be obtained by the nursing staff.  Thank you for asking me to  participate in the care of this most unfortunate woman.  I will be happy to follow her with you.    Maryagnes AmosJ. Jeffrey Poggi, MD  Beeper #:  [phone]  04/09/2016 8:12 AM

## 2016-04-09 NOTE — Transfer of Care (Incomplete)
Immediate Anesthesia Transfer of Care Note  Patient: Mariah Jimenez  Procedure(s) Performed: Procedure(s): INTRAMEDULLARY (IM) NAIL TIBIAL (Right)  Patient Location: {PLACES; ANE POST:19477::"PACU"}  Anesthesia Type:{PROCEDURES; ANE POST ANESTHESIA TYPE:19480}  Level of Consciousness: {FINDINGS; ANE POST LEVEL OF CONSCIOUSNESS:19484}  Airway & Oxygen Therapy: {Exam; oxygen device:30095}  Post-op Assessment: {ASSESSMENT;POST-OP ZOXWRU:04540}REPORT:18741}  Post vital signs: {DESC; ANE POST JWJXBJ:47829}VITALS:19483}  Last Vitals:  Vitals:   04/09/16 0856 04/09/16 1229  BP: 116/71 (!) 142/75  Pulse: 93 95  Resp: 17 18  Temp: 36.4 C 36.6 C    Last Pain:  Vitals:   04/09/16 1229  TempSrc: Tympanic         Complications: {FINDINGS; ANE POST COMPLICATIONS:19485}

## 2016-04-09 NOTE — Anesthesia Postprocedure Evaluation (Signed)
Anesthesia Post Note  Patient: Mariah EhlersBarbara Jimenez  Procedure(s) Performed: Procedure(s) (LRB): INTRAMEDULLARY (IM) NAIL TIBIAL (Right)  Patient location during evaluation: PACU Anesthesia Type: General Level of consciousness: awake and alert Pain management: pain level controlled Vital Signs Assessment: post-procedure vital signs reviewed and stable Respiratory status: spontaneous breathing, nonlabored ventilation and respiratory function stable Cardiovascular status: blood pressure returned to baseline and stable Postop Assessment: no signs of nausea or vomiting Anesthetic complications: no     Last Vitals:  Vitals:   04/09/16 1625 04/09/16 1640  BP: 120/82 (!) 111/53  Pulse: 88 77  Resp: 17 18  Temp:  36.4 C    Last Pain:  Vitals:   04/09/16 1640  TempSrc:   PainSc: Asleep                 Caydyn Sprung

## 2016-04-09 NOTE — Transfer of Care (Signed)
Immediate Anesthesia Transfer of Care Note  Patient: Mariah Jimenez  Procedure(s) Performed: Procedure(s): INTRAMEDULLARY (IM) NAIL TIBIAL (Right)  Patient Location: PACU  Anesthesia Type:General  Level of Consciousness: sedated  Airway & Oxygen Therapy: Patient Spontanous Breathing and Patient connected to face mask oxygen  Post-op Assessment: Report given to RN and Post -op Vital signs reviewed and stable  Post vital signs: Reviewed and stable  Last Vitals:  Vitals:   04/09/16 0856 04/09/16 1229  BP: 116/71 (!) 142/75  Pulse: 93 95  Resp: 17 18  Temp: 36.4 C 36.6 C    Last Pain:  Vitals:   04/09/16 1229  TempSrc: Tympanic         Complications: No apparent anesthesia complications

## 2016-04-09 NOTE — Op Note (Signed)
04/08/2016 - 04/09/2016  3:55 PM  Patient:   Mariah EhlersBarbara Jimenez  Pre-Op Diagnosis:   Closed displaced distal third right tib-fib fracture.  Post-Op Diagnosis:   Same.  Procedure:   Reduction and intramedullary nailing of right tibial fracture.  Surgeon:   Maryagnes AmosJ. Jeffrey Jaedon Siler, MD  Assistant:   None  Anesthesia:   GET  Findings:   As above.  Complications:   None  EBL:   150 cc  Fluids:   1700 cc crystalloid  UOP:   30 cc  TT:   None  Drains:   None  Closure:   Staples  Implants:   Biomet 12 x 345 mm Versanail with 3 proximal and 3 distal interlocking screws.  Brief Clinical Note:   The patient is a 70 year old mentally handicapped woman with a seizure disorder who lives with her sister. Apparently, she lost her balance and fell at her home yesterday evening. She was brought to the emergency room where x-rays demonstrated the above-noted injury. She was admitted and presents at this time for definitive management of this injury.  Procedure:   The patient was brought into the operating room and lain in the supine position. After adequate general endotracheal intubation and anesthesia were obtained, the right lower extremity and foot were prepped with ChloraPrep solution before being draped sterilely. Preoperative antibiotics were administered. A timeout was obtained to verify the appropriate surgical site before an approximately 7-8 cm incision was made longitudinally over the anterior aspect of the knee beginning at the inferior pole of the patella and extending just past the tibial tubercle. The incision was carried down through the subcutaneous tissues to expose the patellar tendon. The medial border the patella tendon was identified and this plane developed down to the proximal tibia. A small amount of fat pad was excised to optimize visualization. Under fluoroscopic guidance, a drill bit was placed into the proximal tibia at the midpoint of the tibia medial laterally and just on the  anterior aspect of the plateau in the sagittal plane. After assessing the adequacy of pin position, the drill bit was overreamed with the initial reamer. The beaded guidewire was passed down the tibial shaft, across the fracture site, and into the distal fragment, again under fluoroscopic visualization. The adequacy of pin position was verified fluoroscopically in AP and lateral projections and found to be excellent. The guidewire was measured and found to be 34.5 cm. The tibial canal then was reamed sequentially beginning with a 10 mm flexible reamer and progressing to a 13 mm flexible reamer. The 12 x 345 mm tibial nail was inserted and advanced to within 1 cm of the tibial plafond, monitoring the progression of the nail and the adequacy of fracture alignment in both AP and lateral projections using fluoroscopic imaging while advancing the nail. Once the nail was positioned appropriately, three proximal interlocking screws of appropriate length were placed utilizing the proximal tibial guide. Distally, utilizing the perfect circle technique, three interlocking screws of appropriate length were placed via appropriately placed stab incisions as well. Again, the overall construct was assessed fluoroscopically in AP and lateral projections and found to be excellent.  The wounds were copiously irrigated with sterile saline solution using bulb irrigation before the medial retinacular layer was reapproximated using #0 Vicryl interrupted sutures. The subcutaneous tissues were closed in two layers using 2-0 Vicryl interrupted sutures before the skin was closed using staples. The stab incisions also each were closed using staples. A sterile bulky dressing was applied to the  knee and lower leg before the patient was placed into a posterior splint with sugar tong supplement maintaining the ankle in neutral dorsiflexion. The patient then was awakened, extubated, and returned to the recovery room in satisfactory condition  after tolerating the procedure well.

## 2016-04-09 NOTE — H&P (Signed)
Mariah Jimenez is an 70 y.o. female.   Chief Complaint: Fall HPI: The patient with past medical history of mental retardation and seizure disorder presents to the emergency department after suffering a mechanical fall. She usually walks with the help of a walker but apparently tried to ambulate to the bathroom on her own in a dark room while her family members were sleep which resulted in a fall. The patient was unable to stand or walk afterward. In the emergency department x-ray of her right lower extremity showed tibial and fibular fracture. Orthopedic surgery was consulted and the patient was admitted to the hospital for surgical repair. She is unable to contribute to her own history.  Past Medical History:  Diagnosis Date  . Mental retardation   . Seizures ([place])     Past Surgical History:  Procedure Laterality Date  . NO PAST SURGERIES      Family History  Problem Relation Age of Onset  . Family history unknown: Yes   Social History:  reports that she has never smoked. She has never used smokeless tobacco. She reports that she does not drink alcohol or use drugs.  Allergies: No Known Allergies  Medications:  Keppra Dilantin Phenobarbital (Pharmacy reconciliation scheduled for morning--doses of medications unknown)  Results for orders placed or performed during the hospital encounter of 04/08/16 (from the past 48 hour(s))  CBC with Differential     Status: Abnormal   Collection Time: 04/09/16  1:33 AM  Result Value Ref Range   WBC 7.5 3.6 - 11.0 K/uL   RBC 3.61 (L) 3.80 - 5.20 MIL/uL   Hemoglobin 11.1 (L) 12.0 - 16.0 g/dL   HCT 32.2 (L) 35.0 - 47.0 %   MCV 89.0 80.0 - 100.0 fL   MCH 30.7 26.0 - 34.0 pg   MCHC 34.4 32.0 - 36.0 g/dL   RDW 13.9 11.5 - 14.5 %   Platelets 190 150 - 440 K/uL   Neutrophils Relative % 77 %   Neutro Abs 5.7 1.4 - 6.5 K/uL   Lymphocytes Relative 17 %   Lymphs Abs 1.3 1.0 - 3.6 K/uL   Monocytes Relative 4 %   Monocytes Absolute 0.3 0.2 - 0.9  K/uL   Eosinophils Relative 2 %   Eosinophils Absolute 0.1 0 - 0.7 K/uL   Basophils Relative 0 %   Basophils Absolute 0.0 0 - 0.1 K/uL  Comprehensive metabolic panel     Status: Abnormal   Collection Time: 04/09/16  1:33 AM  Result Value Ref Range   Sodium 138 135 - 145 mmol/L   Potassium 4.2 3.5 - 5.1 mmol/L   Chloride 108 101 - 111 mmol/L   CO2 24 22 - 32 mmol/L   Glucose, Bld 115 (H) 65 - 99 mg/dL   BUN 12 6 - 20 mg/dL   Creatinine, Ser 0.59 0.44 - 1.00 mg/dL   Calcium 8.9 8.9 - 10.3 mg/dL   Total Protein 8.0 6.5 - 8.1 g/dL   Albumin 3.3 (L) 3.5 - 5.0 g/dL   AST 26 15 - 41 U/L   ALT 18 14 - 54 U/L   Alkaline Phosphatase 216 (H) 38 - 126 U/L   Total Bilirubin 0.5 0.3 - 1.2 mg/dL   GFR calc non Af Amer >60 >60 mL/min   GFR calc Af Amer >60 >60 mL/min    Comment: (NOTE) The eGFR has been calculated using the CKD EPI equation. This calculation has not been validated in all clinical situations. eGFR's persistently <60 mL/min  signify possible Chronic Kidney Disease.    Anion gap 6 5 - 15  Type and screen [facility]     Status: None   Collection Time: 04/09/16  1:33 AM  Result Value Ref Range   ABO/RH(D) B POS    Antibody Screen NEG    Sample Expiration 04/12/2016   APTT     Status: None   Collection Time: 04/09/16  1:33 AM  Result Value Ref Range   aPTT 25 24 - 36 seconds  Protime-INR     Status: None   Collection Time: 04/09/16  1:33 AM  Result Value Ref Range   Prothrombin Time 14.4 11.4 - 15.2 seconds   INR 1.11   TSH     Status: None   Collection Time: 04/09/16  1:33 AM  Result Value Ref Range   TSH 2.034 0.350 - 4.500 uIU/mL    Comment: Performed by a 3rd Generation assay with a functional sensitivity of <=0.01 uIU/mL.  Surgical pcr screen     Status: Abnormal   Collection Time: 04/09/16  3:55 AM  Result Value Ref Range   MRSA, PCR POSITIVE (A) NEGATIVE    Comment: CRITICAL RESULT CALLED TO, READ BACK BY AND VERIFIED WITH: TRACY TOOMBS  AT 0519 ON 04/09/16 [place].    Staphylococcus aureus POSITIVE (A) NEGATIVE    Comment:        The Xpert SA Assay (FDA approved for NASAL specimens in patients over 41 years of age), is one component of a comprehensive surveillance program.  Test performance has been validated by [facility] for patients greater than or equal to 69 year old. It is not intended to diagnose infection nor to guide or monitor treatment.    Dg Tibia/fibula Right  Result Date: 04/09/2016 CLINICAL DATA:  Fall today with injury to ankle in right lower leg. Pain. EXAM: RIGHT TIBIA AND FIBULA - 2 VIEW COMPARISON:  None. FINDINGS: Comminuted displaced fractures of the distal tibia and fibular diaphysis. There is 14 mm lateral displacement of distal tibial fragment and 8 mm lateral displacement of distal fibular fragment. Mild apex posterior angulation of the tibial fracture. Knee alignment is maintained. Diffuse soft tissue edema. IMPRESSION: Comminuted displaced distal tibia and fibular shaft fractures. Electronically Signed   By: Jeb Levering M.D.   On: 04/09/2016 00:01   Dg Ankle Complete Right  Result Date: 04/09/2016 CLINICAL DATA:  Fall today injuring right ankle and lower leg. Twisting injury. EXAM: RIGHT ANKLE - COMPLETE 3+ VIEW COMPARISON:  None. FINDINGS: Comminuted displaced fractures of the distal tibia and fibular diaphysis. There is 14 mm lateral displacement of distal tibial fragment and 8 mm lateral displacement of distal fibular fragment. Mild apex posterior angulation of the tibial fracture. No extension to the ankle joint. Ankle mortise is preserved. Diffuse soft tissue edema. IMPRESSION: Comminuted displaced distal tibia and fibular shaft fractures. No intra-articular extension. Electronically Signed   By: Jeb Levering M.D.   On: 04/09/2016 00:00   Dg Tibia/fibula Right Port  Result Date: 04/09/2016 CLINICAL DATA:  Distal tibia fracture, postreduction. EXAM: PORTABLE RIGHT TIBIA AND FIBULA  - 2 VIEW COMPARISON:  Pre reduction radiographs 2 hours prior. FINDINGS: Grossly unchanged alignment of comminuted distal tibia and fibular shaft fractures compared to pre reduction views. Persistent angulation and displacement. Overlying cast material in place. IMPRESSION: Grossly unchanged alignment of comminuted angulated distal tibia and fibular fractures. Electronically Signed   By: Jeb Levering M.D.   On: 04/09/2016 01:49  Review of Systems  Unable to perform ROS: Mental acuity    Blood pressure (!) 134/56, pulse (!) 118, temperature 98.3 F (36.8 C), temperature source Axillary, resp. rate 16, height '5\' 9"'$  (1.753 m), weight 113.4 kg (250 lb), SpO2 (!) 88 %. Physical Exam  Vitals reviewed. Constitutional: She is oriented to person, place, and time. She appears well-developed and well-nourished. No distress.  HENT:  Head: Normocephalic and atraumatic.  Mouth/Throat: Oropharynx is clear and moist.  Eyes: Conjunctivae and EOM are normal. Pupils are equal, round, and reactive to light. No scleral icterus.  Neck: Normal range of motion. Neck supple. No JVD present. No tracheal deviation present. No thyromegaly present.  Cardiovascular: Normal rate, regular rhythm and normal heart sounds.  Exam reveals no gallop and no friction rub.   No murmur heard. Respiratory: Effort normal and breath sounds normal.  GI: Soft. Bowel sounds are normal. She exhibits no distension. There is no tenderness.  Genitourinary:  Genitourinary Comments: Deferred  Musculoskeletal: She exhibits no edema.  Right lower extremity casted below knee  Lymphadenopathy:    She has no cervical adenopathy.  Neurological: She is alert and oriented to person, place, and time. No cranial nerve deficit. She exhibits normal muscle tone.  Skin: Skin is warm and dry. No rash noted. No erythema.  Psychiatric: She has a normal mood and affect. Her behavior is normal.  Thought and judgement difficult to assess as the patient  has not been very verbal with examiner due to mental retardation     Assessment/Plan This is a 70 year old female admitted for right fibula tibial fracture. 1. Tibia-fibula fracture: Right leg; immobilized. ORIF scheduled for this morning. The patient is low to moderate risk for noncardiac thoracic surgery. She has not had any recent seizure activity. Continue seizure medications. Check vitamin D level 2. Seizure disorder: The patient has Keppra scheduled for this morning. She takes phenobarbital and Dilantin at night but we will confirm doses with her pharmacy which is [facility] in [place] on [address]. Ativan as needed for seizure activity. 3. DVT prophylaxis: Lovenox post surgery 4. GI prophylaxis: None The patient is a full code. Time spent on admission orders and patient care approximate 45 minutes  Harrie Foreman, MD 04/09/2016, 7:54 AM

## 2016-04-10 ENCOUNTER — Encounter: Payer: Self-pay | Admitting: Surgery

## 2016-04-10 DIAGNOSIS — S82251A Displaced comminuted fracture of shaft of right tibia, initial encounter for closed fracture: Secondary | ICD-10-CM | POA: Diagnosis not present

## 2016-04-10 LAB — CBC WITH DIFFERENTIAL/PLATELET
Basophils Absolute: 0 10*3/uL (ref 0–0.1)
Basophils Relative: 1 %
EOS ABS: 0.2 10*3/uL (ref 0–0.7)
Eosinophils Relative: 4 %
HEMATOCRIT: 27.7 % — AB (ref 35.0–47.0)
HEMOGLOBIN: 9.5 g/dL — AB (ref 12.0–16.0)
LYMPHS ABS: 1.6 10*3/uL (ref 1.0–3.6)
LYMPHS PCT: 34 %
MCH: 30.3 pg (ref 26.0–34.0)
MCHC: 34.2 g/dL (ref 32.0–36.0)
MCV: 88.6 fL (ref 80.0–100.0)
Monocytes Absolute: 0.4 10*3/uL (ref 0.2–0.9)
Monocytes Relative: 8 %
NEUTROS ABS: 2.6 10*3/uL (ref 1.4–6.5)
NEUTROS PCT: 53 %
Platelets: 164 10*3/uL (ref 150–440)
RBC: 3.13 MIL/uL — AB (ref 3.80–5.20)
RDW: 13.5 % (ref 11.5–14.5)
WBC: 4.9 10*3/uL (ref 3.6–11.0)

## 2016-04-10 LAB — BASIC METABOLIC PANEL
Anion gap: 3 — ABNORMAL LOW (ref 5–15)
BUN: 8 mg/dL (ref 6–20)
CHLORIDE: 109 mmol/L (ref 101–111)
CO2: 28 mmol/L (ref 22–32)
Calcium: 8.4 mg/dL — ABNORMAL LOW (ref 8.9–10.3)
Creatinine, Ser: 0.46 mg/dL (ref 0.44–1.00)
GFR calc Af Amer: >60 mL/min (ref >60)
GFR calc non Af Amer: >60 mL/min (ref >60)
Glucose, Bld: 109 mg/dL — ABNORMAL HIGH (ref 65–99)
POTASSIUM: 4.4 mmol/L (ref 3.5–5.1)
SODIUM: 140 mmol/L (ref 135–145)

## 2016-04-10 LAB — HEMOGLOBIN A1C
Hgb A1c MFr Bld: 5.2 % (ref 4.8–5.6)
Mean Plasma Glucose: 103 mg/dL

## 2016-04-10 MED ORDER — PHENOBARBITAL 30 MG PO TABS: 30.0000 mg | ORAL_TABLET | Freq: Every day | ORAL | Status: DC

## 2016-04-10 MED ORDER — METOPROLOL TARTRATE 5 MG/5ML IV SOLN
2.5000 mg | Freq: Four times a day (QID) | INTRAVENOUS | Status: DC
  Administered 2016-04-11: 2.5 mg via INTRAVENOUS
  Filled 2016-04-10 (×2): qty 5

## 2016-04-10 NOTE — Progress Notes (Signed)
[employer] - [place] at [facility]                                                                                                                                                                                  Patient Demographics   Mariah Jimenez, is a 70 y.o. female, DOB - 1945/07/23, ZOX:[id number]  Admit date - 04/08/2016   Admitting Physician Arnaldo Natal, MD  Outpatient Primary MD for the patient is [facility] [employer] [facility] [facility]   LOS - 1  Subjective: Pt admitted with tib fib fracture s/p Reduction and intramedullary nailing of right tibial fracture    Review of Systems:   CONSTITUTIONAL: pt with mental retardation   Vitals:   Vitals:   04/09/16 2300 04/10/16 0403 04/10/16 0849 04/10/16 1133  BP: 138/67 120/80 125/63 (!) 114/55  Pulse: 90 87 (!) 125 (!) 108  Resp: 19 19 18 16   Temp: 99 F (37.2 C) 98.7 F (37.1 C) 97.8 F (36.6 C) 97.7 F (36.5 C)  TempSrc:  Oral Oral Oral  SpO2: 100% 100% 99% 100%  Weight:  225 lb 6.4 oz (102.2 kg)    Height:        Wt Readings from Last 3 Encounters:  04/10/16 225 lb 6.4 oz (102.2 kg)  05/20/13 250 lb (113.4 kg)     Intake/Output Summary (Last 24 hours) at 04/10/16 1328 Last data filed at 04/09/16 1840  Gross per 24 hour  Intake          2593.33 ml  Output              280 ml  Net          2313.33 ml    Physical Exam:   GENERAL: Pleasant-appearing in no apparent distress.  HEAD, EYES, EARS, NOSE AND THROAT: Atraumatic, normocephalic. Extraocular muscles are intact. Pupils equal and reactive to light. Sclerae anicteric. No conjunctival injection. No oro-pharyngeal erythema.  NECK: Supple. There is no jugular venous distention. No bruits, no lymphadenopathy, no thyromegaly.  HEART: Regular rate and rhythm,. No murmurs, no rubs, no clicks.  LUNGS: Clear to auscultation bilaterally. No rales or rhonchi. No wheezes.  ABDOMEN: Soft, flat, nontender, nondistended. Has good bowel sounds. No  hepatosplenomegaly appreciated.  EXTREMITIES: No evidence of any cyanosis, clubbing, or peripheral edema.  +2 pedal and radial pulses bilaterally.  NEUROLOGIC: The patient is  awake, , with no focal motor or sensory deficits appreciated bilaterally.  SKIN: Moist and warm with no rashes appreciated.  Psych: Not anxious, depressed LN: No inguinal LN enlargement    Antibiotics   Anti-infectives    Start     Dose/Rate Route Frequency  Ordered Stop   04/09/16 1300  vancomycin (VANCOCIN) IVPB 1000 mg/200 mL premix     1,000 mg 200 mL/hr over 60 Minutes Intravenous  Once 04/09/16 1254 04/09/16 1359   04/09/16 1254  vancomycin (VANCOCIN) 1-5 GM/200ML-% IVPB    Comments:  Eli HoseBuchanan, Leslie: cabinet override      04/09/16 1254 04/10/16 0059   04/09/16 0600  vancomycin (VANCOCIN) 1,500 mg in sodium chloride 0.9 % 500 mL IVPB     1,500 mg 250 mL/hr over 120 Minutes Intravenous  Once 04/09/16 0553 04/09/16 0814   04/09/16 0549  ceFAZolin (ANCEF) IVPB 2g/100 mL premix  Status:  Discontinued     2 g 200 mL/hr over 30 Minutes Intravenous 30 min pre-op 04/09/16 0552 04/09/16 0553   04/03/16 0100  vancomycin (VANCOCIN) IVPB 1000 mg/200 mL premix     1,000 mg 200 mL/hr over 60 Minutes Intravenous Every 12 hours 04/09/16 1700 04/03/16 1259      Medications   Scheduled Meds: . Chlorhexidine Gluconate Cloth  6 each Topical Q0600  . docusate sodium  100 mg Oral BID  . enoxaparin (LOVENOX) injection  40 mg Subcutaneous Q24H  . levETIRAcetam  1,500 mg Oral BID  . metoprolol  2.5 mg Intravenous Q6H  . mupirocin ointment  1 application Nasal BID  . pantoprazole  40 mg Oral Daily  . phenobarbital  32.4 mg Oral QHS  . phenytoin  300 mg Oral QHS  . sodium chloride flush  3 mL Intravenous Q12H   Continuous Infusions: . dextrose 5 % and 0.9% NaCl 100 mL/hr at 04/10/16 0133   PRN Meds:.acetaminophen **OR** acetaminophen, bisacodyl, diphenhydrAMINE, HYDROmorphone (DILAUDID) injection, LORazepam, magnesium  hydroxide, metoCLOPramide **OR** metoCLOPramide (REGLAN) injection, ondansetron **OR** ondansetron (ZOFRAN) IV, oxyCODONE, sodium phosphate   Data Review:   Micro Results Recent Results (from the past 240 hour(s))  Surgical pcr screen     Status: Abnormal   Collection Time: 04/09/16  3:55 AM  Result Value Ref Range Status   MRSA, PCR POSITIVE (A) NEGATIVE Final    Comment: CRITICAL RESULT CALLED TO, READ BACK BY AND VERIFIED WITH: TRACY TOOMBS AT 0519 ON 04/09/16 [facility].    Staphylococcus aureus POSITIVE (A) NEGATIVE Final    Comment:        The Xpert SA Assay (FDA approved for NASAL specimens in patients over 69 years of age), is one component of a comprehensive surveillance program.  Test performance has been validated by [facility] for patients greater than or equal to 649 year old. It is not intended to diagnose infection nor to guide or monitor treatment.     Radiology Reports Dg Tibia/fibula Right  Result Date: 04/09/2016 CLINICAL DATA:  Status post surgical internal fixation of the right tibial fracture. EXAM: DG C-ARM 61-120 MIN; RIGHT TIBIA AND FIBULA - 2 VIEW FLUOROSCOPY TIME:  2 minutes 28 seconds. COMPARISON:  Radiographs of same day. FINDINGS: Six intraoperative fluoroscopic images demonstrate intramedullary rod fixation of comminuted distal right tibial fracture. Improved alignment of fracture components is noted. Continued presence of comminuted and mildly displaced distal right fibular fracture is noted. IMPRESSION: Status post intramedullary rod fixation of comminuted distal right tibial fracture. Electronically Signed   By: Lupita RaiderJames  Green Jr, M.D.   On: 04/09/2016 15:30   Dg Tibia/fibula Right  Result Date: 04/09/2016 CLINICAL DATA:  Fall today with injury to ankle in right lower leg. Pain. EXAM: RIGHT TIBIA AND FIBULA - 2 VIEW COMPARISON:  None. FINDINGS: Comminuted displaced fractures of the distal tibia  and fibular diaphysis. There is 14 mm lateral displacement  of distal tibial fragment and 8 mm lateral displacement of distal fibular fragment. Mild apex posterior angulation of the tibial fracture. Knee alignment is maintained. Diffuse soft tissue edema. IMPRESSION: Comminuted displaced distal tibia and fibular shaft fractures. Electronically Signed   By: Rubye Oaks M.D.   On: 04/09/2016 00:01   Dg Ankle Complete Right  Result Date: 04/09/2016 CLINICAL DATA:  Fall today injuring right ankle and lower leg. Twisting injury. EXAM: RIGHT ANKLE - COMPLETE 3+ VIEW COMPARISON:  None. FINDINGS: Comminuted displaced fractures of the distal tibia and fibular diaphysis. There is 14 mm lateral displacement of distal tibial fragment and 8 mm lateral displacement of distal fibular fragment. Mild apex posterior angulation of the tibial fracture. No extension to the ankle joint. Ankle mortise is preserved. Diffuse soft tissue edema. IMPRESSION: Comminuted displaced distal tibia and fibular shaft fractures. No intra-articular extension. Electronically Signed   By: Rubye Oaks M.D.   On: 04/09/2016 00:00   Dg Tibia/fibula Right Port  Result Date: 04/09/2016 CLINICAL DATA:  Distal tibia fracture, postreduction. EXAM: PORTABLE RIGHT TIBIA AND FIBULA - 2 VIEW COMPARISON:  Pre reduction radiographs 2 hours prior. FINDINGS: Grossly unchanged alignment of comminuted distal tibia and fibular shaft fractures compared to pre reduction views. Persistent angulation and displacement. Overlying cast material in place. IMPRESSION: Grossly unchanged alignment of comminuted angulated distal tibia and fibular fractures. Electronically Signed   By: Rubye Oaks M.D.   On: 04/09/2016 01:49   Dg C-arm 61-120 Min  Result Date: 04/09/2016 CLINICAL DATA:  Status post surgical internal fixation of the right tibial fracture. EXAM: DG C-ARM 61-120 MIN; RIGHT TIBIA AND FIBULA - 2 VIEW FLUOROSCOPY TIME:  2 minutes 28 seconds. COMPARISON:  Radiographs of same day. FINDINGS: Six  intraoperative fluoroscopic images demonstrate intramedullary rod fixation of comminuted distal right tibial fracture. Improved alignment of fracture components is noted. Continued presence of comminuted and mildly displaced distal right fibular fracture is noted. IMPRESSION: Status post intramedullary rod fixation of comminuted distal right tibial fracture. Electronically Signed   By: Lupita Raider, M.D.   On: 04/09/2016 15:30     CBC  Recent Labs Lab 04/09/16 0133 04/10/16 0523  WBC 7.5 4.9  HGB 11.1* 9.5*  HCT 32.2* 27.7*  PLT 190 164  MCV 89.0 88.6  MCH 30.7 30.3  MCHC 34.4 34.2  RDW 13.9 13.5  LYMPHSABS 1.3 1.6  MONOABS 0.3 0.4  EOSABS 0.1 0.2  BASOSABS 0.0 0.0    Chemistries   Recent Labs Lab 04/09/16 0133 04/10/16 0523  NA 138 140  K 4.2 4.4  CL 108 109  CO2 24 28  GLUCOSE 115* 109*  BUN 12 8  CREATININE 0.59 0.46  CALCIUM 8.9 8.4*  AST 26  --   ALT 18  --   ALKPHOS 216*  --   BILITOT 0.5  --    ------------------------------------------------------------------------------------------------------------------ estimated creatinine clearance is 84.4 mL/min (by C-G formula based on SCr of 0.46 mg/dL). ------------------------------------------------------------------------------------------------------------------  Recent Labs  04/09/16 0133  HGBA1C 5.2   ------------------------------------------------------------------------------------------------------------------ No results for input(s): CHOL, HDL, LDLCALC, TRIG, CHOLHDL, LDLDIRECT in the last 72 hours. ------------------------------------------------------------------------------------------------------------------  Recent Labs  04/09/16 0133  TSH 2.034   ------------------------------------------------------------------------------------------------------------------ No results for input(s): VITAMINB12, FOLATE, FERRITIN, TIBC, IRON, RETICCTPCT in the last 72 hours.  Coagulation  profile  Recent Labs Lab 04/09/16 0133  INR 1.11    No results for input(s): DDIMER in the last 72 hours.  Cardiac Enzymes No results for input(s): CKMB, TROPONINI, MYOGLOBIN in the last 168 hours.  Invalid input(s): CK ------------------------------------------------------------------------------------------------------------------ Invalid input(s): POCBNP    Assessment & Plan   This is a 70 year old female admitted for right fibula tibial fracture. 1. Tibia-fibula fracture: Right leg; immobilized.  S/p ORIF   Non weight bearing to right leg 2. Seizure disorder: continue home sz medications 3. DVT prophylaxis: Lovenox post surgery 4. GI prophylaxis: None        Code Status Orders        Start     Ordered   04/09/16 0311  Full code  Continuous     04/09/16 0310    Code Status History    Date Active Date Inactive Code Status Order ID Comments User Context   This patient has a current code status but no historical code status.           Consults  ortho   DVT Prophylaxis  Lovenox   Lab Results  Component Value Date   PLT 164 04/10/2016     Time Spent in minutes   35min  Greater than 50% of time spent in care coordination and counseling patient regarding the condition and plan of care.   Auburn BilberryPATEL, Ibn Stief M.D on 04/10/2016 at 1:28 PM  Between 7am to 6pm - Pager - [phone]  After 6pm go to [email or web address] - password [email or web address] [facility]  [facility]   Office  [phone]

## 2016-04-10 NOTE — Clinical Social Work Note (Signed)
CSW received consult for possible STR. CSW will assess pending PT recommendations. Note: the patient will have a level 2 PASRR due to intellectual disorder which will not be processed prior to Tuesday 04/13/2016 due to the holiday.  Mariah PonderKaren Martha Dymond Jimenez, MSW, LCSW-A [phone]

## 2016-04-10 NOTE — Progress Notes (Signed)
Physical Therapy Treatment Patient Details Name: Mariah Jimenez MRN: [id number] DOB: 03/24/1946 Today's Date: 04/10/2016    History of Present Illness Patient is a 70 y.o. female admitted after mechanical fall in the bathroom, acquiring a R tibia and fibula fx. ORIF performed, and patient NWB on R. PMH includes mental retardation and seizure disorder.    PT Comments    Patient demonstrates improved ability to follow commands for bed exercises at this afternoon's session. Requires multiple tactile/visual cues for performance. Patient's HR still tachycardic (103 bpm) throughout session with no signs of acute distress; mobility training deferred. Will commence tomorrow if medically appropriate.   Follow Up Recommendations  Home health PT;Supervision/Assistance - 24 hour;Supervision for mobility/OOB     Equipment Recommendations  Rolling walker with 5" wheels;3in1 (PT)    Recommendations for Other Services       Precautions / Restrictions Precautions Precautions: Fall Required Braces or Orthoses: Other Brace/Splint (R ankle) Restrictions Weight Bearing Restrictions: Yes RLE Weight Bearing: Non weight bearing    Mobility  Bed Mobility Overal bed mobility: Needs Assistance Bed Mobility: Rolling;Supine to Sit Rolling: Mod assist   Supine to sit: Mod assist     General bed mobility comments: Patient required mutiple single step commands with visual demonstration to follow commands. Unable to express pain but noted wanting to lie supine after moving from supine to long sitting.  Transfers                    Ambulation/Gait                 Stairs            Wheelchair Mobility    Modified Rankin (Stroke Patients Only)       Balance                                    Cognition Arousal/Alertness: Awake/alert Behavior During Therapy: WFL for tasks assessed/performed Overall Cognitive Status: History of cognitive impairments - at  baseline                 General Comments: Subjective history obtained from sister at bedside    Exercises General Exercises - Lower Extremity Ankle Circles/Pumps: AAROM;Left;10 reps Short Arc Quad: AROM;Both;10 reps Heel Slides: AAROM;Both;10 reps Hip ABduction/ADduction: AAROM;Both;10 reps Straight Leg Raises: AAROM;Both;10 reps    General Comments        Pertinent Vitals/Pain Pain Assessment: Faces Faces Pain Scale: Hurts a little bit Pain Intervention(s): Limited activity within patient's tolerance;Monitored during session    Home Living Family/patient expects to be discharged to:: Private residence Living Arrangements: Other relatives;Other (Comment) (Sister) Available Help at Discharge: Family;Available 24 hours/day Type of Home: House Home Access: Stairs to enter   Home Layout: One level Home Equipment: Environmental consultantWalker - 4 wheels;Tub bench      Prior Function Level of Independence: Needs assistance  Gait / Transfers Assistance Needed: Patient previously modified independent with rollator for household distances. ADL's / Homemaking Assistance Needed: Performed by sister     PT Goals (current goals can now be found in the care plan section) Acute Rehab PT Goals Patient Stated Goal: Unstated PT Goal Formulation: With patient/family Time For Goal Achievement: 04/24/16 Potential to Achieve Goals: Fair Progress towards PT goals: Progressing toward goals    Frequency    BID      PT Plan Current plan remains appropriate  Co-evaluation             End of Session Equipment Utilized During Treatment: Oxygen Activity Tolerance: Patient tolerated treatment well Patient left: in bed;with call bell/phone within reach;with bed alarm set;with family/visitor present;with SCD's reapplied     Time: 1405-1430 PT Time Calculation (min) (ACUTE ONLY): 25 min  Charges:  $Therapeutic Exercise: 23-37 mins                    G Codes:      Neita CarpJulie Ann Verlie Hellenbrand, PT,  DPT 04/10/2016, 2:38 PM

## 2016-04-10 NOTE — Progress Notes (Addendum)
MD- Dr. Allena KatzPatel made aware of increase in patient's Heart rate, per CCMD patient has been running 80's to 90's and is now running in the 120's to  130's with an increase to 150. Order for med to follow.  Checked on patient, patient was in no acute distress and was observed to be sleeping.

## 2016-04-10 NOTE — Progress Notes (Signed)
Pt received pain medication at 1715 per family request, this writer fed pt dinner, pt in no distress.

## 2016-04-10 NOTE — Progress Notes (Signed)
This Clinical research associatewriter received report on pt at 1030, assumed pt. At 1400, pt's family requested pain medication for pt as pt is unable to do this, and pt was checked on room air and found to be 100%, so o2 was removed from pt. Pt did not appear to be in any distress.

## 2016-04-10 NOTE — Progress Notes (Signed)
  Subjective: 1 Day Post-Op Procedure(s) (LRB): INTRAMEDULLARY (IM) NAIL TIBIAL (Right) Patient is unable to provide a verbal pain score, but does not appear to be in any distress. Patient is well, and has had no acute complaints or problems Plan is to go Skilled nursing facility after hospital stay. Negative for chest pain and shortness of breath Fever: no Gastrointestinal:Negative for nausea and vomiting  Objective: Vital signs in last 24 hours: Temp:  [97 F (36.1 C)-99 F (37.2 C)] 98.7 F (37.1 C) (12/30 0403) Pulse Rate:  [75-99] 87 (12/30 0403) Resp:  [9-19] 19 (12/30 0403) BP: (111-155)/(50-91) 120/80 (12/30 0403) SpO2:  [94 %-100 %] 100 % (12/30 0403) FiO2 (%):  [28 %] 28 % (12/29 1625) Weight:  [102.2 kg (225 lb 6.4 oz)] 102.2 kg (225 lb 6.4 oz) (12/30 0403)  Intake/Output from previous day:  Intake/Output Summary (Last 24 hours) at 04/10/16 0840 Last data filed at 04/09/16 1840  Gross per 24 hour  Intake          2593.33 ml  Output              280 ml  Net          2313.33 ml    Intake/Output this shift: No intake/output data recorded.  Labs:  Recent Labs  04/09/16 0133 04/10/16 0523  HGB 11.1* 9.5*    Recent Labs  04/09/16 0133 04/10/16 0523  WBC 7.5 4.9  RBC 3.61* 3.13*  HCT 32.2* 27.7*  PLT 190 164    Recent Labs  04/09/16 0133 04/10/16 0523  NA 138 140  K 4.2 4.4  CL 108 109  CO2 24 28  BUN 12 8  CREATININE 0.59 0.46  GLUCOSE 115* 109*  CALCIUM 8.9 8.4*    Recent Labs  04/09/16 0133  INR 1.11     EXAM General - Patient is Alert, Disorganized and Lacking.  Medical history of mental retardation. Extremity - Sensation intact distally Incision: dressing C/D/I No cellulitis present  Flexion and extension upon command to the patient's right toes. Cap refill is intact to the right toes. Dressing/Incision - clean, dry, no drainage Motor Function - intact, moving foot and toes well on exam.  Past Medical History:  Diagnosis  Date  . Mental retardation   . Seizures (HCC)     Assessment/Plan: 1 Day Post-Op Procedure(s) (LRB): INTRAMEDULLARY (IM) NAIL TIBIAL (Right) Active Problems:   Tibia/fibula fracture  Estimated body mass index is 33.29 kg/m as calculated from the following:   Height as of this encounter: 5\' 9"  (1.753 m).   Weight as of this encounter: 102.2 kg (225 lb 6.4 oz). Advance diet Up with therapy  Labs reviewed this AM, Hg stable at 9.5 this AM.  CBC and BMP ordered for tomorrow morning. Up with therapy today, will likely need SNF placement. Unable to verbalize pain score however patient does not appear to be in any distress.  DVT Prophylaxis - Lovenox, Foot Pumps and TED hose applied to the left foot. Non-weightbearing to the right lower extremity.  Valeria BatmanJ. Lance McGhee, PA-C [facility] 04/10/2016, 8:40 AM

## 2016-04-10 NOTE — Progress Notes (Addendum)
Physical Therapy Evaluation Patient Details Name: Mariah EhlersBarbara Jimenez MRN: [id number] DOB: 03/24/1946 Today's Date: 04/10/2016   History of Present Illness  Patient is a 70 y.o. female admitted after mechanical fall in the bathroom, acquiring a R tibia and fibula fx. ORIF performed, and patient NWB on R. PMH includes mental retardation and seizure disorder.  Clinical Impression  Patient is a pleasant 70 y.o. Female s/p R ankle ORIF with NWB precautions. Patient's HR has been tachycardic today, so full mobility assessment deferred. Resting HR in no acute distress 105 bpm at beginning of evaluation and 110 bpm following bed mobility. According to patient's sister at bedside, patient is previously modified independent with household ambulation with rollator. On evaluation, patient required moderate assistance to perform bed mobility and demonstrated significant weakness in R LE. Because of patient's PMH and sister's preference, patient would like to be discharged home at conclusion of hospital stay but may require more assistance for mobility until returning to baseline level of function.    Follow Up Recommendations Home health PT;Supervision for mobility/OOB;Supervision/Assistance - 24 hour    Equipment Recommendations  Rolling walker with 5" wheels;3in1 (PT)    Recommendations for Other Services       Precautions / Restrictions Precautions Precautions: Fall Restrictions Weight Bearing Restrictions: Yes RLE Weight Bearing: Non weight bearing      Mobility  Bed Mobility Overal bed mobility: Needs Assistance Bed Mobility: Rolling;Supine to Sit Rolling: Mod assist   Supine to sit: Mod assist     General bed mobility comments: Patient required mutiple single step commands with visual demonstration to follow commands. Unable to express pain but noted wanting to lie supine after moving from supine to long sitting.  Transfers                    Ambulation/Gait                 Stairs            Wheelchair Mobility    Modified Rankin (Stroke Patients Only)       Balance                                             Pertinent Vitals/Pain Pain Assessment: No/denies pain    Home Living Family/patient expects to be discharged to:: Private residence Living Arrangements: Other relatives;Other (Comment) (Sister) Available Help at Discharge: Family;Available 24 hours/day Type of Home: House Home Access: Stairs to enter   Entergy CorporationEntrance Stairs-Number of Steps: 5 Home Layout: One level Home Equipment: Walker - 4 wheels;Tub bench      Prior Function Level of Independence: Needs assistance   Gait / Transfers Assistance Needed: Patient previously modified independent with rollator for household distances.  ADL's / Homemaking Assistance Needed: Performed by sister        Hand Dominance        Extremity/Trunk Assessment   Upper Extremity Assessment Upper Extremity Assessment: Generalized weakness;Difficult to assess due to impaired cognition    Lower Extremity Assessment Lower Extremity Assessment: Generalized weakness;RLE deficits/detail;Difficult to assess due to impaired cognition RLE Deficits / Details: R ankle immoblized in splint; 3-/5 hip/knee strength RLE: Unable to fully assess due to immobilization       Communication      Cognition Arousal/Alertness: Awake/alert Behavior During Therapy: WFL for tasks assessed/performed Overall Cognitive Status: History of cognitive impairments -  at baseline                 General Comments: Subjective history obtained from sister at bedside    General Comments      Exercises     Assessment/Plan    PT Assessment Patient needs continued PT services  PT Problem List Decreased strength;Decreased activity tolerance;Decreased range of motion;Decreased balance;Decreased cognition;Decreased knowledge of use of DME;Decreased safety awareness;Pain;Decreased knowledge of  precautions;Decreased mobility          PT Treatment Interventions DME instruction;Gait training;Functional mobility training;Therapeutic activities;Therapeutic exercise;Balance training;Cognitive remediation;Patient/family education    PT Goals (Current goals can be found in the Care Plan section)  Acute Rehab PT Goals Patient Stated Goal: Unstated PT Goal Formulation: With patient/family Time For Goal Achievement: 04/24/16 Potential to Achieve Goals: Fair    Frequency BID   Barriers to discharge        Co-evaluation               End of Session Equipment Utilized During Treatment: Oxygen Activity Tolerance: Treatment limited secondary to medical complications (Comment);Other (comment) (Tachycardia) Patient left: in bed;with call bell/phone within reach;with bed alarm set;with family/visitor present;with SCD's reapplied           Time: [id number] PT Time Calculation (min) (ACUTE ONLY): 17 min   Charges:   PT Evaluation $PT Eval Low Complexity: 1 Procedure     PT G Codes:        Mariah CarpJulie Ann Ellaina Jimenez, PT, DPT 04/10/2016, 12:54 PM

## 2016-04-11 DIAGNOSIS — S82251A Displaced comminuted fracture of shaft of right tibia, initial encounter for closed fracture: Secondary | ICD-10-CM | POA: Diagnosis not present

## 2016-04-11 LAB — BASIC METABOLIC PANEL
Anion gap: 6 (ref 5–15)
BUN: 9 mg/dL (ref 6–20)
CHLORIDE: 107 mmol/L (ref 101–111)
CO2: 26 mmol/L (ref 22–32)
CREATININE: 0.52 mg/dL (ref 0.44–1.00)
Calcium: 8.9 mg/dL (ref 8.9–10.3)
GFR calc non Af Amer: >60 mL/min (ref >60)
Glucose, Bld: 92 mg/dL (ref 65–99)
POTASSIUM: 4.6 mmol/L (ref 3.5–5.1)
Sodium: 139 mmol/L (ref 135–145)

## 2016-04-11 LAB — CBC
HCT: 29.9 % — ABNORMAL LOW (ref 35.0–47.0)
Hemoglobin: 10.2 g/dL — ABNORMAL LOW (ref 12.0–16.0)
MCH: 30.6 pg (ref 26.0–34.0)
MCHC: 34 g/dL (ref 32.0–36.0)
MCV: 89.9 fL (ref 80.0–100.0)
Platelets: 166 10*3/uL (ref 150–440)
RBC: 3.33 MIL/uL — ABNORMAL LOW (ref 3.80–5.20)
RDW: 13.8 % (ref 11.5–14.5)
WBC: 7.2 10*3/uL (ref 3.6–11.0)

## 2016-04-11 MED ORDER — METOPROLOL TARTRATE 25 MG PO TABS
25.0000 mg | ORAL_TABLET | Freq: Two times a day (BID) | ORAL | Status: DC
  Administered 2016-04-11 – 2016-04-15 (×8): 25 mg via ORAL
  Filled 2016-04-11 (×9): qty 1

## 2016-04-11 NOTE — Progress Notes (Signed)
Physical Therapy Treatment Patient Details Name: Mariah EhlersBarbara Astle MRN: [id number] DOB: 03/24/1946 Today's Date: 04/11/2016    History of Present Illness Patient is a 70 y.o. female admitted after mechanical fall in the bathroom, acquiring a R tibia and fibula fx. ORIF performed, and patient NWB on R. PMH includes mental retardation and seizure disorder.    PT Comments    Patient demonstrating improved interaction at today's session, trying to talk to PT. Patient demonstrates improved ability to follow commands to perform therapeutic exercises with tactile cues for proper performance. Able to stand at today's session with minimal assistance but required moderate assistance to maintain static balance with tendency to violate WB precautions. Because of the amount of assistance required, PT updated discharge plan to SNF, as patient would benefit from more intensive PT to return to PLOF.  Follow Up Recommendations  SNF     Equipment Recommendations       Recommendations for Other Services       Precautions / Restrictions Precautions Precautions: Fall Required Braces or Orthoses: Other Brace/Splint Restrictions Weight Bearing Restrictions: Yes RLE Weight Bearing: Non weight bearing    Mobility  Bed Mobility Overal bed mobility: Needs Assistance Bed Mobility: Supine to Sit Rolling: Mod assist   Supine to sit: Mod assist     General bed mobility comments: Patient demonstrates improved ability to follow commands to get to EOB today. Required assistance for scooting without weightbearing on R.   Transfers Overall transfer level: Needs assistance Equipment used: Rolling walker (2 wheeled) Transfers: Sit to/from Stand Sit to Stand: Min assist         General transfer comment: Patient requires minimal assistance into standing but requires moderate assistance to maintain static balance with tendency to violate WB precautions. STS performed x5 with slight improvements in movement  in later repetitions.  Ambulation/Gait                 Stairs            Wheelchair Mobility    Modified Rankin (Stroke Patients Only)       Balance Overall balance assessment: Needs assistance;History of Falls Sitting-balance support: Feet supported;Single extremity supported Sitting balance-Leahy Scale: Fair     Standing balance support: Bilateral upper extremity supported Standing balance-Leahy Scale: Poor                      Cognition Arousal/Alertness: Awake/alert Behavior During Therapy: WFL for tasks assessed/performed Overall Cognitive Status: History of cognitive impairments - at baseline                      Exercises General Exercises - Lower Extremity Ankle Circles/Pumps: AAROM;Left;10 reps Quad Sets: AAROM;Strengthening;10 reps Short Arc Quad: AROM;Strengthening;10 reps Heel Slides: AAROM;Strengthening;10 reps Hip ABduction/ADduction: AAROM;Strengthening;10 reps Straight Leg Raises: AAROM;Strengthening;10 reps    General Comments        Pertinent Vitals/Pain Pain Assessment: Faces Faces Pain Scale: Hurts a little bit Pain Location: R ankle Pain Intervention(s): Limited activity within patient's tolerance;Monitored during session    Home Living                      Prior Function            PT Goals (current goals can now be found in the care plan section) Acute Rehab PT Goals Patient Stated Goal: Unstated PT Goal Formulation: With patient Time For Goal Achievement: 04/24/16 Potential to Achieve Goals: Fair Progress towards  PT goals: Progressing toward goals    Frequency    BID      PT Plan Discharge plan needs to be updated    Co-evaluation             End of Session Equipment Utilized During Treatment: Gait belt Activity Tolerance: Patient tolerated treatment well;Patient limited by pain;Patient limited by fatigue Patient left: in bed;with call bell/phone within reach;with bed alarm  set;with SCD's reapplied     Time: [id number] PT Time Calculation (min) (ACUTE ONLY): 29 min  Charges:  $Therapeutic Exercise: 8-22 mins $Therapeutic Activity: 8-22 mins                    G Codes:      Neita CarpJulie Ann Mikalah Skyles, PT, DPT 04/11/2016, 11:18 AM

## 2016-04-11 NOTE — Progress Notes (Signed)
  Subjective: 2 Days Post-Op Procedure(s) (LRB): INTRAMEDULLARY (IM) NAIL TIBIAL (Right) Patient is unable to provide a verbal pain score, but does not appear to be in any distress. Patient is well, and has had no acute complaints or problems Plan is to go Skilled nursing facility after hospital stay. Negative for chest pain and shortness of breath Fever: no Gastrointestinal:Negative for nausea and vomiting  Objective: Vital signs in last 24 hours: Temp:  [97.7 F (36.5 C)-98.9 F (37.2 C)] 98.4 F (36.9 C) (12/31 0555) Pulse Rate:  [108-125] 118 (12/31 0555) Resp:  [16-18] 16 (12/31 0555) BP: (114-142)/(55-68) 126/68 (12/31 0555) SpO2:  [99 %-100 %] 99 % (12/31 0555) Weight:  [103.5 kg (228 lb 3.2 oz)] 103.5 kg (228 lb 3.2 oz) (12/31 0555)  Intake/Output from previous day: No intake or output data in the 24 hours ending 04/11/16 0738  Intake/Output this shift: No intake/output data recorded.  Labs:  Recent Labs  04/09/16 0133 04/10/16 0523 04/11/16 0657  HGB 11.1* 9.5* 10.2*    Recent Labs  04/10/16 0523 04/11/16 0657  WBC 4.9 7.2  RBC 3.13* 3.33*  HCT 27.7* 29.9*  PLT 164 166    Recent Labs  04/10/16 0523 04/11/16 0554  NA 140 139  K 4.4 4.6  CL 109 107  CO2 28 26  BUN 8 9  CREATININE 0.46 0.52  GLUCOSE 109* 92  CALCIUM 8.4* 8.9    Recent Labs  04/09/16 0133  INR 1.11     EXAM General - Patient is Alert, Disorganized and Lacking.  Medical history of mental retardation. Extremity - Sensation intact distally Incision: dressing C/D/I No cellulitis present  Flexion and extension upon command to the patient's right toes. Cap refill is intact to the right toes. Dressing/Incision - clean, dry, no drainage Motor Function - intact, moving foot and toes well on exam.  Past Medical History:  Diagnosis Date  . Mental retardation   . Seizures (HCC)     Assessment/Plan: 2 Days Post-Op Procedure(s) (LRB): INTRAMEDULLARY (IM) NAIL TIBIAL  (Right) Active Problems:   Tibia/fibula fracture  Estimated body mass index is 33.7 kg/m as calculated from the following:   Height as of this encounter: 5\' 9"  (1.753 m).   Weight as of this encounter: 103.5 kg (228 lb 3.2 oz). Advance diet Up with therapy  Labs reviewed this AM, Hg stable at 10.2 this AM. Up with therapy today, continue non-weightbearing status, likely need SNF. Unable to verbalize pain score however patient does not appear to be in any distress. Pt remains tachycardic, however +2313 on fluids yesterday, no fever or WBC.  Denies any chest pain and no apparent distress, no visualized shortness of breath.  Will continue to monitor.  DVT Prophylaxis - Lovenox, Foot Pumps and TED hose applied to the left foot. Non-weightbearing to the right lower extremity.  Valeria BatmanJ. Lance McGhee, PA-C [facility] 04/11/2016, 7:38 AM

## 2016-04-11 NOTE — Progress Notes (Signed)
[employer] [facility] - [place] [facility]                                                                                                                                                                                  Patient Demographics   Kazoua Gossen, is a 70 y.o. female, DOB - 16-Aug-1945, XBJ:[id number]  Admit date - 04/08/2016   Admitting Physician Arnaldo Natal, MD  Outpatient Primary MD for the patient is [facility]   LOS - 2  Subjective: Denies any complaints unable to communicate effectively due to her mental retardation    Review of Systems:   CONSTITUTIONAL: pt with mental retardation   Vitals:   Vitals:   04/10/16 1133 04/10/16 2004 04/11/16 0555 04/11/16 0849  BP: (!) 114/55 (!) 142/56 126/68 (!) 111/57  Pulse: (!) 108 (!) 118 (!) 118 (!) 105  Resp: 16 16 16 20   Temp: 97.7 F (36.5 C) 98.9 F (37.2 C) 98.4 F (36.9 C) 97.8 F (36.6 C)  TempSrc: Oral Oral Oral   SpO2: 100% 100% 99%   Weight:   228 lb 3.2 oz (103.5 kg)   Height:        Wt Readings from Last 3 Encounters:  04/11/16 228 lb 3.2 oz (103.5 kg)  05/20/13 250 lb (113.4 kg)    No intake or output data in the 24 hours ending 04/11/16 1248  Physical Exam:   GENERAL: Pleasant-appearing in no apparent distress.  HEAD, EYES, EARS, NOSE AND THROAT: Atraumatic, normocephalic. Extraocular muscles are intact. Pupils equal and reactive to light. Sclerae anicteric. No conjunctival injection. No oro-pharyngeal erythema.  NECK: Supple. There is no jugular venous distention. No bruits, no lymphadenopathy, no thyromegaly.  HEART: Regular rate and rhythm,. No murmurs, no rubs, no clicks.  LUNGS: Clear to auscultation bilaterally. No rales or rhonchi. No wheezes.  ABDOMEN: Soft, flat, nontender, nondistended. Has good bowel sounds. No hepatosplenomegaly appreciated.  EXTREMITIES: No evidence of any cyanosis, clubbing, or peripheral edema.  +2 pedal and radial pulses  bilaterally.  NEUROLOGIC: The patient is  awake, , with no focal motor or sensory deficits appreciated bilaterally.  SKIN: Moist and warm with no rashes appreciated.  Psych: Not anxious, depressed LN: No inguinal LN enlargement    Antibiotics   Anti-infectives    Start     Dose/Rate Route Frequency Ordered Stop   04/09/16 1300  vancomycin (VANCOCIN) IVPB 1000 mg/200 mL premix     1,000 mg 200 mL/hr over 60 Minutes Intravenous  Once 04/09/16 1254 04/09/16 1359   04/09/16 1254  vancomycin (VANCOCIN) 1-5 GM/200ML-% IVPB    Comments:  Eli Hose: cabinet override  04/09/16 1254 04/10/16 0059   04/09/16 0600  vancomycin (VANCOCIN) 1,500 mg in sodium chloride 0.9 % 500 mL IVPB     1,500 mg 250 mL/hr over 120 Minutes Intravenous  Once 04/09/16 0553 04/09/16 0814   04/09/16 0549  ceFAZolin (ANCEF) IVPB 2g/100 mL premix  Status:  Discontinued     2 g 200 mL/hr over 30 Minutes Intravenous 30 min pre-op 04/09/16 0552 04/09/16 0553   04/03/16 0100  vancomycin (VANCOCIN) IVPB 1000 mg/200 mL premix     1,000 mg 200 mL/hr over 60 Minutes Intravenous Every 12 hours 04/09/16 1700 04/03/16 1259      Medications   Scheduled Meds: . Chlorhexidine Gluconate Cloth  6 each Topical Q0600  . docusate sodium  100 mg Oral BID  . enoxaparin (LOVENOX) injection  40 mg Subcutaneous Q24H  . levETIRAcetam  1,500 mg Oral BID  . metoprolol  2.5 mg Intravenous Q6H  . mupirocin ointment  1 application Nasal BID  . pantoprazole  40 mg Oral Daily  . phenobarbital  32.4 mg Oral QHS  . phenytoin  300 mg Oral QHS  . sodium chloride flush  3 mL Intravenous Q12H   Continuous Infusions: . dextrose 5 % and 0.9% NaCl 100 mL/hr at 04/10/16 0133   PRN Meds:.acetaminophen **OR** acetaminophen, bisacodyl, diphenhydrAMINE, HYDROmorphone (DILAUDID) injection, LORazepam, magnesium hydroxide, metoCLOPramide **OR** metoCLOPramide (REGLAN) injection, ondansetron **OR** ondansetron (ZOFRAN) IV, oxyCODONE, sodium  phosphate   Data Review:   Micro Results Recent Results (from the past 240 hour(s))  Surgical pcr screen     Status: Abnormal   Collection Time: 04/09/16  3:55 AM  Result Value Ref Range Status   MRSA, PCR POSITIVE (A) NEGATIVE Final    Comment: CRITICAL RESULT CALLED TO, READ BACK BY AND VERIFIED WITH: TRACY TOOMBS AT 0519 ON 04/09/16 [facility].    Staphylococcus aureus POSITIVE (A) NEGATIVE Final    Comment:        The Xpert SA Assay (FDA approved for NASAL specimens in patients over 45 years of age), is one component of a comprehensive surveillance program.  Test performance has been validated by [facility] for patients greater than or equal to 7 year old. It is not intended to diagnose infection nor to guide or monitor treatment.     Radiology Reports Dg Tibia/fibula Right  Result Date: 04/09/2016 CLINICAL DATA:  Status post surgical internal fixation of the right tibial fracture. EXAM: DG C-ARM 61-120 MIN; RIGHT TIBIA AND FIBULA - 2 VIEW FLUOROSCOPY TIME:  2 minutes 28 seconds. COMPARISON:  Radiographs of same day. FINDINGS: Six intraoperative fluoroscopic images demonstrate intramedullary rod fixation of comminuted distal right tibial fracture. Improved alignment of fracture components is noted. Continued presence of comminuted and mildly displaced distal right fibular fracture is noted. IMPRESSION: Status post intramedullary rod fixation of comminuted distal right tibial fracture. Electronically Signed   By: Lupita Raider, M.D.   On: 04/09/2016 15:30   Dg Tibia/fibula Right  Result Date: 04/09/2016 CLINICAL DATA:  Fall today with injury to ankle in right lower leg. Pain. EXAM: RIGHT TIBIA AND FIBULA - 2 VIEW COMPARISON:  None. FINDINGS: Comminuted displaced fractures of the distal tibia and fibular diaphysis. There is 14 mm lateral displacement of distal tibial fragment and 8 mm lateral displacement of distal fibular fragment. Mild apex posterior angulation of the tibial  fracture. Knee alignment is maintained. Diffuse soft tissue edema. IMPRESSION: Comminuted displaced distal tibia and fibular shaft fractures. Electronically Signed   By: Rubye Oaks  M.D.   On: 04/09/2016 00:01   Dg Ankle Complete Right  Result Date: 04/09/2016 CLINICAL DATA:  Fall today injuring right ankle and lower leg. Twisting injury. EXAM: RIGHT ANKLE - COMPLETE 3+ VIEW COMPARISON:  None. FINDINGS: Comminuted displaced fractures of the distal tibia and fibular diaphysis. There is 14 mm lateral displacement of distal tibial fragment and 8 mm lateral displacement of distal fibular fragment. Mild apex posterior angulation of the tibial fracture. No extension to the ankle joint. Ankle mortise is preserved. Diffuse soft tissue edema. IMPRESSION: Comminuted displaced distal tibia and fibular shaft fractures. No intra-articular extension. Electronically Signed   By: Rubye OaksMelanie  Ehinger M.D.   On: 04/09/2016 00:00   Dg Tibia/fibula Right Port  Result Date: 04/09/2016 CLINICAL DATA:  Distal tibia fracture, postreduction. EXAM: PORTABLE RIGHT TIBIA AND FIBULA - 2 VIEW COMPARISON:  Pre reduction radiographs 2 hours prior. FINDINGS: Grossly unchanged alignment of comminuted distal tibia and fibular shaft fractures compared to pre reduction views. Persistent angulation and displacement. Overlying cast material in place. IMPRESSION: Grossly unchanged alignment of comminuted angulated distal tibia and fibular fractures. Electronically Signed   By: Rubye OaksMelanie  Ehinger M.D.   On: 04/09/2016 01:49   Dg C-arm 61-120 Min  Result Date: 04/09/2016 CLINICAL DATA:  Status post surgical internal fixation of the right tibial fracture. EXAM: DG C-ARM 61-120 MIN; RIGHT TIBIA AND FIBULA - 2 VIEW FLUOROSCOPY TIME:  2 minutes 28 seconds. COMPARISON:  Radiographs of same day. FINDINGS: Six intraoperative fluoroscopic images demonstrate intramedullary rod fixation of comminuted distal right tibial fracture. Improved alignment of  fracture components is noted. Continued presence of comminuted and mildly displaced distal right fibular fracture is noted. IMPRESSION: Status post intramedullary rod fixation of comminuted distal right tibial fracture. Electronically Signed   By: Lupita RaiderJames  Green Jr, M.D.   On: 04/09/2016 15:30     CBC  Recent Labs Lab 04/09/16 0133 04/10/16 0523 04/11/16 0657  WBC 7.5 4.9 7.2  HGB 11.1* 9.5* 10.2*  HCT 32.2* 27.7* 29.9*  PLT 190 164 166  MCV 89.0 88.6 89.9  MCH 30.7 30.3 30.6  MCHC 34.4 34.2 34.0  RDW 13.9 13.5 13.8  LYMPHSABS 1.3 1.6  --   MONOABS 0.3 0.4  --   EOSABS 0.1 0.2  --   BASOSABS 0.0 0.0  --     Chemistries   Recent Labs Lab 04/09/16 0133 04/10/16 0523 04/11/16 0554  NA 138 140 139  K 4.2 4.4 4.6  CL 108 109 107  CO2 24 28 26   GLUCOSE 115* 109* 92  BUN 12 8 9   CREATININE 0.59 0.46 0.52  CALCIUM 8.9 8.4* 8.9  AST 26  --   --   ALT 18  --   --   ALKPHOS 216*  --   --   BILITOT 0.5  --   --    ------------------------------------------------------------------------------------------------------------------ estimated creatinine clearance is 85 mL/min (by C-G formula based on SCr of 0.52 mg/dL). ------------------------------------------------------------------------------------------------------------------  Recent Labs  04/09/16 0133  HGBA1C 5.2   ------------------------------------------------------------------------------------------------------------------ No results for input(s): CHOL, HDL, LDLCALC, TRIG, CHOLHDL, LDLDIRECT in the last 72 hours. ------------------------------------------------------------------------------------------------------------------  Recent Labs  04/09/16 0133  TSH 2.034   ------------------------------------------------------------------------------------------------------------------ No results for input(s): VITAMINB12, FOLATE, FERRITIN, TIBC, IRON, RETICCTPCT in the last 72 hours.  Coagulation profile  Recent  Labs Lab 04/09/16 0133  INR 1.11    No results for input(s): DDIMER in the last 72 hours.  Cardiac Enzymes No results for input(s): CKMB, TROPONINI, MYOGLOBIN in the  last 168 hours.  Invalid input(s): CK ------------------------------------------------------------------------------------------------------------------ Invalid input(s): POCBNP    Assessment & Plan   This is a 70 year old female admitted for right fibula tibial fracture. 1. Tibia-fibula fracture: Right leg; immobilized.  S/p ORIF   Non weight bearing to right leg will need skilled nursing facility discharge 2. Seizure disorder: continue home sz medications no seizure noted here 3. DVT prophylaxis: Lovenox post surgery 4. GI prophylaxis: None        Code Status Orders        Start     Ordered   04/09/16 0311  Full code  Continuous     04/09/16 0310    Code Status History    Date Active Date Inactive Code Status Order ID Comments User Context   This patient has a current code status but no historical code status.           Consults  ortho   DVT Prophylaxis  Lovenox   Lab Results  Component Value Date   PLT 166 04/11/2016     Time Spent in minutes   32min  Greater than 50% of time spent in care coordination and counseling patient regarding the condition and plan of care.   Auburn BilberryPATEL, Thoms Barthelemy M.D on 04/11/2016 at 12:48 PM  Between 7am to 6pm - Pager - [phone]  After 6pm go to [email or web address] - password [email or web address] [facility]  [facility]   Office  [phone]

## 2016-04-11 NOTE — Clinical Social Work Note (Signed)
CSW attempted to contact family to discuss dc planning. Due to the nature of the planning complexity, the [place] 2 and PASSR have been entered and a referral sent to all area SNFs. The PASSR will require manual review. According to the RN and attending, the patient's family has given verbal permission for SNF referral. CSW will con't to follow.  Argentina PonderKaren Martha Zell Doucette, MSW, LCSW-A [phone]

## 2016-04-11 NOTE — [place] FL2 (Signed)
[place] MEDICAID FL2 LEVEL OF CARE SCREENING TOOL     IDENTIFICATION  Patient Name: Mariah EhlersBarbara Jimenez Birthdate: 03/24/1946 Sex: female Admission Date (Current Location): 04/08/2016  [place]   Facility and Address:  [facility], [address], [place], [place] [address]      Provider Number: [id number]  Attending Physician Name and Address:  Auburn BilberryShreyang Patel, MD  Relative Name and Phone Number:       Current Level of Care: Hospital Recommended Level of Care: Skilled Nursing Facility Prior Approval Number:    Date Approved/Denied:   PASRR Number:    Discharge Plan: SNF    Current Diagnoses: Patient Active Problem List   Diagnosis Date Noted  . Tibia/fibula fracture 04/09/2016    Orientation RESPIRATION BLADDER Height & Weight     Self, Time, Situation, Place  Normal Continent Weight: 228 lb 3.2 oz (103.5 kg) Height:  5\' 9"  (175.3 cm)  BEHAVIORAL SYMPTOMS/MOOD NEUROLOGICAL BOWEL NUTRITION STATUS    Convulsions/Seizures (Controlled epilepsy with medication) Continent    AMBULATORY STATUS COMMUNICATION OF NEEDS Skin   Extensive Assist Verbally Bruising                       Personal Care Assistance Level of Assistance  Bathing, Dressing Bathing Assistance: Limited assistance   Dressing Assistance: Limited assistance     Functional Limitations Info             SPECIAL CARE FACTORS FREQUENCY  PT (By licensed PT)     PT Frequency: Up to 5X per day 5X per week              Contractures Contractures Info: Present    Additional Factors Info  Psychotropic, Isolation Precautions     Psychotropic Info: Luminal, Protonix, Ativan, Dilantin    Isolation Precautions Info: MRSA positive     Current Medications (04/11/2016):  This is the current hospital active medication list Current Facility-Administered Medications  Medication Dose Route Frequency Provider Last Rate Last Dose  .  acetaminophen (TYLENOL) tablet 650 mg  650 mg Oral Q6H PRN Christena FlakeJohn J Poggi, MD   650 mg at 04/11/16 1245   Or  . acetaminophen (TYLENOL) suppository 650 mg  650 mg Rectal Q6H PRN Christena FlakeJohn J Poggi, MD      . bisacodyl (DULCOLAX) suppository 10 mg  10 mg Rectal Daily PRN Christena FlakeJohn J Poggi, MD      . Chlorhexidine Gluconate Cloth 2 % PADS 6 each  6 each Topical Q0600 Arnaldo NatalMichael S Diamond, MD   6 each at 04/11/16 [phone]  . dextrose 5 %-0.9 % sodium chloride infusion   Intravenous Continuous Christena FlakeJohn J Poggi, MD 100 mL/hr at 04/10/16 0133    . diphenhydrAMINE (BENADRYL) 12.5 MG/5ML elixir 12.5-25 mg  12.5-25 mg Oral Q4H PRN Christena FlakeJohn J Poggi, MD      . docusate sodium (COLACE) capsule 100 mg  100 mg Oral BID Christena FlakeJohn J Poggi, MD   100 mg at 04/11/16 [id number]  . enoxaparin (LOVENOX) injection 40 mg  40 mg Subcutaneous Q24H Christena FlakeJohn J Poggi, MD   40 mg at 04/11/16 [id number]  . HYDROmorphone (DILAUDID) injection 1-2 mg  1-2 mg Intravenous Q2H PRN Christena FlakeJohn J Poggi, MD      . levETIRAcetam (KEPPRA) tablet 1,500 mg  1,500 mg Oral BID Nita Sicklearolina Veronese, MD   1,500 mg at 04/11/16 [id number]  . LORazepam (ATIVAN) injection 2 mg  2 mg Intravenous Q5 min PRN Casimiro NeedleMichael  Elias ElseS Diamond, MD      . magnesium hydroxide (MILK OF MAGNESIA) suspension 30 mL  30 mL Oral Daily PRN Christena FlakeJohn J Poggi, MD      . metoCLOPramide (REGLAN) tablet 5-10 mg  5-10 mg Oral Q8H PRN Christena FlakeJohn J Poggi, MD       Or  . metoCLOPramide (REGLAN) injection 5-10 mg  5-10 mg Intravenous Q8H PRN Christena FlakeJohn J Poggi, MD      . metoprolol (LOPRESSOR) injection 2.5 mg  2.5 mg Intravenous Q6H Auburn BilberryShreyang Patel, MD   2.5 mg at 04/11/16 1245  . mupirocin ointment (BACTROBAN) 2 % 1 application  1 application Nasal BID Arnaldo NatalMichael S Diamond, MD   1 application at 04/11/16 [phone]  . ondansetron (ZOFRAN) tablet 4 mg  4 mg Oral Q6H PRN Christena FlakeJohn J Poggi, MD       Or  . ondansetron [facility](ZOFRAN) injection 4 mg  4 mg Intravenous Q6H PRN Christena FlakeJohn J Poggi, MD      . oxyCODONE (Oxy IR/ROXICODONE) immediate release tablet 5-10 mg  5-10 mg Oral Q3H PRN Christena FlakeJohn J  Poggi, MD   5 mg at 04/10/16 2127  . pantoprazole (PROTONIX) EC tablet 40 mg  40 mg Oral Daily Christena FlakeJohn J Poggi, MD   40 mg at 04/11/16 [id number]  . PHENobarbital (LUMINAL) tablet 32.4 mg  32.4 mg Oral QHS Auburn BilberryShreyang Patel, MD   32.4 mg at 04/10/16 2127  . phenytoin (DILANTIN) ER capsule 300 mg  300 mg Oral QHS Christena FlakeJohn J Poggi, MD   300 mg at 04/10/16 2126  . sodium chloride flush (NS) 0.9 % injection 3 mL  3 mL Intravenous Q12H Arnaldo NatalMichael S Diamond, MD   3 mL at 04/11/16 0835  . sodium phosphate (FLEET) 7-19 GM/118ML enema 1 enema  1 enema Rectal Once PRN Christena FlakeJohn J Poggi, MD         Discharge Medications: Please see discharge summary for a list of discharge medications.  Relevant Imaging Results:  Relevant Lab Results:   Additional Information SS# [id number]  Mariah CongKaren M Kailly Richoux, LCSW

## 2016-04-12 DIAGNOSIS — G40909 Epilepsy, unspecified, not intractable, without status epilepticus: Secondary | ICD-10-CM | POA: Diagnosis not present

## 2016-04-12 DIAGNOSIS — F79 Unspecified intellectual disabilities: Secondary | ICD-10-CM | POA: Diagnosis not present

## 2016-04-12 DIAGNOSIS — S82451A Displaced comminuted fracture of shaft of right fibula, initial encounter for closed fracture: Secondary | ICD-10-CM | POA: Diagnosis not present

## 2016-04-12 DIAGNOSIS — S82201A Unspecified fracture of shaft of right tibia, initial encounter for closed fracture: Secondary | ICD-10-CM | POA: Diagnosis not present

## 2016-04-12 DIAGNOSIS — R262 Difficulty in walking, not elsewhere classified: Secondary | ICD-10-CM | POA: Diagnosis not present

## 2016-04-12 DIAGNOSIS — S82251A Displaced comminuted fracture of shaft of right tibia, initial encounter for closed fracture: Secondary | ICD-10-CM | POA: Diagnosis not present

## 2016-04-12 DIAGNOSIS — Z79899 Other long term (current) drug therapy: Secondary | ICD-10-CM | POA: Diagnosis not present

## 2016-04-12 DIAGNOSIS — R000 Tachycardia, unspecified: Secondary | ICD-10-CM | POA: Diagnosis not present

## 2016-04-12 LAB — BASIC METABOLIC PANEL
Anion gap: 5 (ref 5–15)
BUN: 15 mg/dL (ref 6–20)
CALCIUM: 8.5 mg/dL — AB (ref 8.9–10.3)
CHLORIDE: 107 mmol/L (ref 101–111)
CO2: 27 mmol/L (ref 22–32)
CREATININE: 0.49 mg/dL (ref 0.44–1.00)
GFR calc Af Amer: >60 mL/min (ref >60)
GFR calc non Af Amer: >60 mL/min (ref >60)
Glucose, Bld: 97 mg/dL (ref 65–99)
Potassium: 4 mmol/L (ref 3.5–5.1)
SODIUM: 139 mmol/L (ref 135–145)

## 2016-04-12 MED ORDER — OXYCODONE HCL 5 MG PO TABS: 5.0000 mg | ORAL_TABLET | ORAL | 0 refills | Status: DC | PRN

## 2016-04-12 MED ORDER — ENOXAPARIN SODIUM 40 MG/0.4ML ~~LOC~~ SOLN: 40.0000 mg | SUBCUTANEOUS | 0 refills | Status: DC

## 2016-04-12 NOTE — Progress Notes (Addendum)
   Subjective: 3 Days Post-Op Procedure(s) (LRB): INTRAMEDULLARY (IM) NAIL TIBIAL (Right) Patient is a difficult historian. Nods yes to pain and points to right leg. Does not appear to be in any distress. Denies any abd pain. Tolerating po well. We will continue therapy today.  Plan is to go Skilled nursing facility after hospital stay.  Objective: Vital signs in last 24 hours: Temp:  [97.8 F (36.6 C)-98.1 F (36.7 C)] 98.1 F (36.7 C) (01/01 0809) Pulse Rate:  [100-108] 100 (01/01 0809) Resp:  [16-18] 18 (01/01 0809) BP: (101-120)/(50-66) 101/51 (01/01 0809) SpO2:  [94 %-99 %] 94 % (01/01 0809)  Intake/Output from previous day: No intake/output data recorded. Intake/Output this shift: No intake/output data recorded.   Recent Labs  04/10/16 0523 04/11/16 0657  HGB 9.5* 10.2*    Recent Labs  04/10/16 0523 04/11/16 0657  WBC 4.9 7.2  RBC 3.13* 3.33*  HCT 27.7* 29.9*  PLT 164 166    Recent Labs  04/11/16 0554 04/12/16 0527  NA 139 139  K 4.6 4.0  CL 107 107  CO2 26 27  BUN 9 15  CREATININE 0.52 0.49  GLUCOSE 92 97  CALCIUM 8.9 8.5*   No results for input(s): LABPT, INR in the last 72 hours.  EXAM General - Patient is Alert and Appropriate Extremity - Neurovascular intact Sensation intact distally Intact pulses distally Compartment soft Dressing - dressing C/D/I, no drainage and posterior splint intact with ace wrap. incision checked. no drainage. continue with ace wrap and 4x4 gauze with posterior splint Motor Function - intact, moving toes well on exam.   Past Medical History:  Diagnosis Date  . Mental retardation   . Seizures (HCC)     Assessment/Plan:   3 Days Post-Op Procedure(s) (LRB): INTRAMEDULLARY (IM) NAIL TIBIAL (Right) Active Problems:   Tibia/fibula fracture  Estimated body mass index is 33.7 kg/m as calculated from the following:   Height as of this encounter: 5\' 9"  (1.753 m).   Weight as of this encounter: 103.5 kg (228 lb  3.2 oz). Advance diet Up with therapy  Labs and VS are stable Plan on discharge to SNF  Follow up with [facility] in 2 weeks for staple removal/dressing change  DVT Prophylaxis - Lovenox Non-weight-Bearing right lower extremity   T. Cranston Neighborhris Breeley Bischof, PA-C [facility] 04/12/2016, 8:55 AM

## 2016-04-12 NOTE — Progress Notes (Signed)
[employer] [facility]                                                                                                                                                                                  Patient Demographics   Minha Fulco, is a 71 y.o. female, DOB - November 03, 1945, [id number]  Admit date - 04/08/2016   Admitting Physician Arnaldo Natal, MD  Outpatient Primary MD for the patient is [facility]   LOS - 3  Subjective: She has no complaints her heart rate is currently controlled    Review of Systems:   CONSTITUTIONAL: pt with mental retardation   Vitals:   Vitals:   04/11/16 1715 04/11/16 1937 04/12/16 0357 04/12/16 0809  BP: 120/66 (!) 104/50 (!) 108/58 (!) 101/51  Pulse: (!) 108 (!) 101 100 100  Resp: 18 18 16 18   Temp: 97.8 F (36.6 C) 98 F (36.7 C) 97.8 F (36.6 C) 98.1 F (36.7 C)  TempSrc: Oral Oral Axillary Oral  SpO2: 99% 97% 98% 94%  Weight:      Height:        Wt Readings from Last 3 Encounters:  04/11/16 228 lb 3.2 oz (103.5 kg)  05/20/13 250 lb (113.4 kg)    No intake or output data in the 24 hours ending 04/12/16 1512  Physical Exam:   GENERAL: Pleasant-appearing in no apparent distress.  HEAD, EYES, EARS, NOSE AND THROAT: Atraumatic, normocephalic. Extraocular muscles are intact. Pupils equal and reactive to light. Sclerae anicteric. No conjunctival injection. No oro-pharyngeal erythema.  NECK: Supple. There is no jugular venous distention. No bruits, no lymphadenopathy, no thyromegaly.  HEART: Regular rate and rhythm,. No murmurs, no rubs, no clicks.  LUNGS: Clear to auscultation bilaterally. No rales or rhonchi. No wheezes.  ABDOMEN: Soft, flat, nontender, nondistended. Has good bowel sounds. No hepatosplenomegaly appreciated.  EXTREMITIES: No evidence of any cyanosis, clubbing, or peripheral edema.  +2 pedal and radial pulses bilaterally.  NEUROLOGIC: The patient is  awake,  , with no focal motor or sensory deficits appreciated bilaterally.  SKIN: Moist and warm with no rashes appreciated.  Psych: Not anxious, depressed LN: No inguinal LN enlargement    Antibiotics   Anti-infectives    Start     Dose/Rate Route Frequency Ordered Stop   04/09/16 1300  vancomycin (VANCOCIN) IVPB 1000 mg/200 mL premix     1,000 mg 200 mL/hr over 60 Minutes Intravenous  Once 04/09/16 1254 04/09/16 1359   04/09/16 1254  vancomycin (VANCOCIN) 1-5 GM/200ML-% IVPB    Comments:  Eli Hose: cabinet override      04/09/16 1254 04/10/16 0059  04/09/16 0600  vancomycin (VANCOCIN) 1,500 mg in sodium chloride 0.9 % 500 mL IVPB     1,500 mg 250 mL/hr over 120 Minutes Intravenous  Once 04/09/16 0553 04/09/16 0814   04/09/16 0549  ceFAZolin (ANCEF) IVPB 2g/100 mL premix  Status:  Discontinued     2 g 200 mL/hr over 30 Minutes Intravenous 30 min pre-op 04/09/16 0552 04/09/16 0553   04/03/16 0100  vancomycin (VANCOCIN) IVPB 1000 mg/200 mL premix     1,000 mg 200 mL/hr over 60 Minutes Intravenous Every 12 hours 04/09/16 1700 04/03/16 1259      Medications   Scheduled Meds: . Chlorhexidine Gluconate Cloth  6 each Topical Q0600  . docusate sodium  100 mg Oral BID  . enoxaparin (LOVENOX) injection  40 mg Subcutaneous Q24H  . levETIRAcetam  1,500 mg Oral BID  . metoprolol tartrate  25 mg Oral BID  . mupirocin ointment  1 application Nasal BID  . pantoprazole  40 mg Oral Daily  . phenobarbital  32.4 mg Oral QHS  . phenytoin  300 mg Oral QHS  . sodium chloride flush  3 mL Intravenous Q12H   Continuous Infusions: . dextrose 5 % and 0.9% NaCl 100 mL/hr at 04/10/16 0133   PRN Meds:.acetaminophen **OR** acetaminophen, bisacodyl, diphenhydrAMINE, HYDROmorphone (DILAUDID) injection, LORazepam, magnesium hydroxide, metoCLOPramide **OR** metoCLOPramide (REGLAN) injection, ondansetron **OR** ondansetron (ZOFRAN) IV, oxyCODONE, sodium phosphate   Data Review:   Micro Results Recent  Results (from the past 240 hour(s))  Surgical pcr screen     Status: Abnormal   Collection Time: 04/09/16  3:55 AM  Result Value Ref Range Status   MRSA, PCR POSITIVE (A) NEGATIVE Final    Comment: CRITICAL RESULT CALLED TO, READ BACK BY AND VERIFIED WITH: TRACY TOOMBS AT 0519 ON 04/09/16 [facility].    Staphylococcus aureus POSITIVE (A) NEGATIVE Final    Comment:        The Xpert SA Assay (FDA approved for NASAL specimens in patients over 80 years of age), is one component of a comprehensive surveillance program.  Test performance has been validated by [facility] for patients greater than or equal to 30 year old. It is not intended to diagnose infection nor to guide or monitor treatment.     Radiology Reports Dg Tibia/fibula Right  Result Date: 04/09/2016 CLINICAL DATA:  Status post surgical internal fixation of the right tibial fracture. EXAM: DG C-ARM 61-120 MIN; RIGHT TIBIA AND FIBULA - 2 VIEW FLUOROSCOPY TIME:  2 minutes 28 seconds. COMPARISON:  Radiographs of same day. FINDINGS: Six intraoperative fluoroscopic images demonstrate intramedullary rod fixation of comminuted distal right tibial fracture. Improved alignment of fracture components is noted. Continued presence of comminuted and mildly displaced distal right fibular fracture is noted. IMPRESSION: Status post intramedullary rod fixation of comminuted distal right tibial fracture. Electronically Signed   By: Lupita Raider, M.D.   On: 04/09/2016 15:30   Dg Tibia/fibula Right  Result Date: 04/09/2016 CLINICAL DATA:  Fall today with injury to ankle in right lower leg. Pain. EXAM: RIGHT TIBIA AND FIBULA - 2 VIEW COMPARISON:  None. FINDINGS: Comminuted displaced fractures of the distal tibia and fibular diaphysis. There is 14 mm lateral displacement of distal tibial fragment and 8 mm lateral displacement of distal fibular fragment. Mild apex posterior angulation of the tibial fracture. Knee alignment is maintained. Diffuse soft  tissue edema. IMPRESSION: Comminuted displaced distal tibia and fibular shaft fractures. Electronically Signed   By: Rubye Oaks M.D.   On: 04/09/2016  00:01   Dg Ankle Complete Right  Result Date: 04/09/2016 CLINICAL DATA:  Fall today injuring right ankle and lower leg. Twisting injury. EXAM: RIGHT ANKLE - COMPLETE 3+ VIEW COMPARISON:  None. FINDINGS: Comminuted displaced fractures of the distal tibia and fibular diaphysis. There is 14 mm lateral displacement of distal tibial fragment and 8 mm lateral displacement of distal fibular fragment. Mild apex posterior angulation of the tibial fracture. No extension to the ankle joint. Ankle mortise is preserved. Diffuse soft tissue edema. IMPRESSION: Comminuted displaced distal tibia and fibular shaft fractures. No intra-articular extension. Electronically Signed   By: Rubye Oaks M.D.   On: 04/09/2016 00:00   Dg Tibia/fibula Right Port  Result Date: 04/09/2016 CLINICAL DATA:  Distal tibia fracture, postreduction. EXAM: PORTABLE RIGHT TIBIA AND FIBULA - 2 VIEW COMPARISON:  Pre reduction radiographs 2 hours prior. FINDINGS: Grossly unchanged alignment of comminuted distal tibia and fibular shaft fractures compared to pre reduction views. Persistent angulation and displacement. Overlying cast material in place. IMPRESSION: Grossly unchanged alignment of comminuted angulated distal tibia and fibular fractures. Electronically Signed   By: Rubye Oaks M.D.   On: 04/09/2016 01:49   Dg C-arm 61-120 Min  Result Date: 04/09/2016 CLINICAL DATA:  Status post surgical internal fixation of the right tibial fracture. EXAM: DG C-ARM 61-120 MIN; RIGHT TIBIA AND FIBULA - 2 VIEW FLUOROSCOPY TIME:  2 minutes 28 seconds. COMPARISON:  Radiographs of same day. FINDINGS: Six intraoperative fluoroscopic images demonstrate intramedullary rod fixation of comminuted distal right tibial fracture. Improved alignment of fracture components is noted. Continued presence of  comminuted and mildly displaced distal right fibular fracture is noted. IMPRESSION: Status post intramedullary rod fixation of comminuted distal right tibial fracture. Electronically Signed   By: Lupita Raider, M.D.   On: 04/09/2016 15:30     CBC  Recent Labs Lab 04/09/16 0133 04/10/16 0523 04/11/16 0657  WBC 7.5 4.9 7.2  HGB 11.1* 9.5* 10.2*  HCT 32.2* 27.7* 29.9*  PLT 190 164 166  MCV 89.0 88.6 89.9  MCH 30.7 30.3 30.6  MCHC 34.4 34.2 34.0  RDW 13.9 13.5 13.8  LYMPHSABS 1.3 1.6  --   MONOABS 0.3 0.4  --   EOSABS 0.1 0.2  --   BASOSABS 0.0 0.0  --     Chemistries   Recent Labs Lab 04/09/16 0133 04/10/16 0523 04/11/16 0554 04/12/16 0527  NA 138 140 139 139  K 4.2 4.4 4.6 4.0  CL 108 109 107 107  CO2 24 28 26 27   GLUCOSE 115* 109* 92 97  BUN 12 8 9 15   CREATININE 0.59 0.46 0.52 0.49  CALCIUM 8.9 8.4* 8.9 8.5*  AST 26  --   --   --   ALT 18  --   --   --   ALKPHOS 216*  --   --   --   BILITOT 0.5  --   --   --    ------------------------------------------------------------------------------------------------------------------ estimated creatinine clearance is 85 mL/min (by C-G formula based on SCr of 0.49 mg/dL). ------------------------------------------------------------------------------------------------------------------ No results for input(s): HGBA1C in the last 72 hours. ------------------------------------------------------------------------------------------------------------------ No results for input(s): CHOL, HDL, LDLCALC, TRIG, CHOLHDL, LDLDIRECT in the last 72 hours. ------------------------------------------------------------------------------------------------------------------ No results for input(s): TSH, T4TOTAL, T3FREE, THYROIDAB in the last 72 hours.  Invalid input(s): FREET3 ------------------------------------------------------------------------------------------------------------------ No results for input(s): VITAMINB12, FOLATE,  FERRITIN, TIBC, IRON, RETICCTPCT in the last 72 hours.  Coagulation profile  Recent Labs Lab 04/09/16 0133  INR 1.11  No results for input(s): DDIMER in the last 72 hours.  Cardiac Enzymes No results for input(s): CKMB, TROPONINI, MYOGLOBIN in the last 168 hours.  Invalid input(s): CK ------------------------------------------------------------------------------------------------------------------ Invalid input(s): POCBNP    Assessment & Plan   This is a 71 year old female admitted for right fibula tibial fracture. 1. Tibia-fibula fracture: Right leg; immobilized.  S/p ORIF   Non weight bearing to right leg  awaiting discharge to a nursing facility patient will need a past* 2. Seizure disorder: continue home sz medications no seizure noted here 3. DVT prophylaxis: Lovenox post surgery 4. GI prophylaxis: None        Code Status Orders        Start     Ordered   04/09/16 0311  Full code  Continuous     04/09/16 0310    Code Status History    Date Active Date Inactive Code Status Order ID Comments User Context   This patient has a current code status but no historical code status.           Consults  ortho   DVT Prophylaxis  Lovenox   Lab Results  Component Value Date   PLT 166 04/11/2016     Time Spent in minutes   25min  Greater than 50% of time spent in care coordination and counseling patient regarding the condition and plan of care.   Auburn BilberryPATEL, Coltrane Tugwell M.D on 04/12/2016 at 3:12 PM  Between 7am to 6pm - Pager - [phone]  After 6pm go to [email or web address] - password [email or web address] [facility]  [facility]   Office  [phone]

## 2016-04-12 NOTE — Progress Notes (Signed)
Physical Therapy Treatment Patient Details Name: Mariah Jimenez MRN: [id number] DOB: [birth year] Today's Date: 04/12/2016    History of Present Illness Patient is a 71 y.o. female admitted after mechanical fall in the bathroom, acquiring a R tibia and fibula fx. ORIF performed, and patient NWB on R. PMH includes mental retardation and seizure disorder.    PT Comments    Pt only able to come to partial stand with RW and max assist of therapist today (while maintaining NWB'ing precautions) x5 repetitions.  Pt requiring cueing to participate with LE ex's in bed.  Overall pt appearing to tolerate session well (in terms of pain and activity tolerance) but pt was inconsistent with following 1 step commands.  Will continue to progress pt with strengthening, balance, transfers, and progress to ambulation when appropriate.   Follow Up Recommendations  SNF     Equipment Recommendations  Rolling walker with 5" wheels;3in1 (PT)    Recommendations for Other Services       Precautions / Restrictions Precautions Precautions: Fall Required Braces or Orthoses: Other Brace/Splint Other Brace/Splint: R ankle splint Restrictions Weight Bearing Restrictions: Yes RLE Weight Bearing: Non weight bearing    Mobility  Bed Mobility Overal bed mobility: Needs Assistance Bed Mobility: Supine to Sit;Sit to Supine     Supine to sit: Mod assist Sit to supine: Mod assist   General bed mobility comments: assist for trunk and R LE; vc's for technique; increased time to perform; mod assist to scoot forward on edge of bed  Transfers Overall transfer level: Needs assistance Equipment used: Rolling walker (2 wheeled) Transfers: Sit to/from Stand Sit to Stand: Max assist         General transfer comment: x5 trials; assist for R LE to maintain NWB'ing; pt only able to come to partial stand with max assist maintaining NWB'ing status and using RW (pt pushing with R hand on bed and L UE on bed rail; PT  attempted multiple times to reposition pt's hands and L foot with cues for technique but pt would briefly attempt in this position and return to prior position)  Ambulation/Gait             General Gait Details: not appropriate at this time   Stairs            Wheelchair Mobility    Modified Rankin (Stroke Patients Only)       Balance Overall balance assessment: Needs assistance;History of Falls Sitting-balance support: Bilateral upper extremity supported;Feet unsupported Sitting balance-Leahy Scale: Fair         Standing balance comment: unable to come to full stand to assess                    Cognition Arousal/Alertness: Awake/alert Behavior During Therapy: WFL for tasks assessed/performed Overall Cognitive Status: No family/caregiver present to determine baseline cognitive functioning (Oriented to name)                 General Comments: Difficult to understand pt's speech    Exercises Total Joint Exercises Ankle Circles/Pumps: AROM;Strengthening;Left;10 reps;Supine Short Arc Quad: AAROM;Strengthening;Both;10 reps;Supine (minimal range R LE) Heel Slides: AAROM;Strengthening;Both;10 reps;Supine (minimal range R LE) Hip ABduction/ADduction: AAROM;Strengthening;Both;10 reps;Supine  Vc's and tactile cues required for exercise technique.    General Comments   Nursing cleared pt for participation in physical therapy.  Pt agreeable to PT session.      Pertinent Vitals/Pain Pain Assessment: Faces Faces Pain Scale: Hurts a little bit Pain Location: R ankle Pain  Descriptors / Indicators: Guarding Pain Intervention(s): Limited activity within patient's tolerance;Monitored during session;Premedicated before session;Repositioned    Home Living                      Prior Function            PT Goals (current goals can now be found in the care plan section) Acute Rehab PT Goals Patient Stated Goal: Pt did not state any goals Progress  towards PT goals: Not progressing toward goals - comment (requiring increased assist to stand today)    Frequency    BID      PT Plan Current plan remains appropriate    Co-evaluation             End of Session Equipment Utilized During Treatment: Gait belt Activity Tolerance: Patient tolerated treatment well Patient left: in bed;with call bell/phone within reach;with bed alarm set (R LE elevated on pillow; L heel elevated via towel roll)     Time: 1020-1045 PT Time Calculation (min) (ACUTE ONLY): 25 min  Charges:  $Therapeutic Exercise: 8-22 mins $Therapeutic Activity: 8-22 mins                    G CodesHendricks Jimenez:      Mariah Jimenez 04/12/2016, 10:58 AM Mariah LimesEmily Basil Jimenez, PT [phone]

## 2016-04-12 NOTE — Progress Notes (Signed)
PASARR is pending. Requested clinicals were faxed to [facility] today.   Baker Hughes IncorporatedBailey Samanth Mirkin, LCSW [phone]

## 2016-04-12 NOTE — Clinical Social Work Note (Signed)
Clinical Social Work Assessment  Patient Details  Name: Mariah Jimenez MRN: [id number] Date of Birth: 03/24/1946  Date of referral:  04/12/16               Reason for consult:  Facility Placement                Permission sought to share information with:  Facility Contact Representative Permission granted to share information::  Yes, Verbal Permission Granted  Name::      Skilled Nursing Facility   Agency::   [place]   Relationship::     Contact Information:     Housing/Transportation Living arrangements for the past 2 months:  Single Family Home Source of Information:  Other (Comment Required) (Sisters [name] and [name]) Patient Interpreter Needed:  None Criminal Activity/Legal Involvement Pertinent to Current Situation/Hospitalization:  No - Comment as needed Significant Relationships:  Siblings, Other Family Members Lives with:  Siblings Do you feel safe going back to the place where you live?  Yes Need for family participation in patient care:  Yes (Comment)  Care giving concerns:  Patient lives in [place] with her sister [name]. Per sisters [name] and [name] patient does not have a guardian or HPOA.    Social Worker assessment / plan:  Clinical Social Worker (CSW) received SNF consult. PT is recommending SNF. CSW met with patient and her 2 sisters [name] and [name] were at bedside. Patient was sitting up in the bed. CSW introduced self and explained role of CSW department. Per sister [name] patient lives with her in [place]. CSW explained that PT is recommending SNF. Both sisters are agreeable to SNF search. FL2 complete and faxed out. PASARR is pending. CSW explained that patient's PASARR may take some time to come back because of her MR diagnosis. CSW presented bed offers to sisters. They chose [facility]. CSW sent Jonathon administrator at [facility] a message making him aware of accepted bed offer. CSW will continue to follow and assist as needed.   Employment  status:  Disabled (Comment on whether or not currently receiving Disability) Insurance information:  Medicare, Medicaid In State PT Recommendations:  Skilled Nursing Facility Information / Referral to community resources:  Skilled Nursing Facility  Patient/Family's Response to care:  Patient's sisters are agreeable for patient to D/C to [facility].   Patient/Family's Understanding of and Emotional Response to Diagnosis, Current Treatment, and Prognosis:  Patient and sisters were very pleasant and thanked CSW for assistance.   Emotional Assessment Appearance:  Appears stated age Attitude/Demeanor/Rapport:    Affect (typically observed):  Pleasant Orientation:  Oriented to Self, Oriented to Place, Fluctuating Orientation (Suspected and/or reported Sundowners), Oriented to  Time Alcohol / Substance use:  Not Applicable Psych involvement (Current and /or in the community):  No (Comment)  Discharge Needs  Concerns to be addressed:  Discharge Planning Concerns Readmission within the last 30 days:  No Current discharge risk:  Dependent with Mobility Barriers to Discharge:  Continued Medical Work up   ,  M, LCSW 04/12/2016, 12:16 PM  

## 2016-04-13 DIAGNOSIS — G40909 Epilepsy, unspecified, not intractable, without status epilepticus: Secondary | ICD-10-CM | POA: Diagnosis not present

## 2016-04-13 DIAGNOSIS — R262 Difficulty in walking, not elsewhere classified: Secondary | ICD-10-CM | POA: Diagnosis not present

## 2016-04-13 DIAGNOSIS — S82251A Displaced comminuted fracture of shaft of right tibia, initial encounter for closed fracture: Secondary | ICD-10-CM | POA: Diagnosis not present

## 2016-04-13 DIAGNOSIS — R000 Tachycardia, unspecified: Secondary | ICD-10-CM | POA: Diagnosis not present

## 2016-04-13 DIAGNOSIS — Z79899 Other long term (current) drug therapy: Secondary | ICD-10-CM | POA: Diagnosis not present

## 2016-04-13 DIAGNOSIS — S82201A Unspecified fracture of shaft of right tibia, initial encounter for closed fracture: Secondary | ICD-10-CM | POA: Diagnosis not present

## 2016-04-13 DIAGNOSIS — S82451A Displaced comminuted fracture of shaft of right fibula, initial encounter for closed fracture: Secondary | ICD-10-CM | POA: Diagnosis not present

## 2016-04-13 DIAGNOSIS — F79 Unspecified intellectual disabilities: Secondary | ICD-10-CM | POA: Diagnosis not present

## 2016-04-13 NOTE — Progress Notes (Signed)
Ran Medicare ID once more through [employer] using birth date of 2046/03/13.  Confirmed she has Part B coverage only.

## 2016-04-13 NOTE — Progress Notes (Signed)
Physical Therapy Treatment Patient Details Name: Nohemy Koop MRN: [id number] DOB: [birth year] Today's Date: 04/13/2016    History of Present Illness Patient is a 71 y.o. female admitted after mechanical fall in the bathroom, acquiring a R tibia and fibula fx. ORIF performed, and patient NWB on R. PMH includes mental retardation and seizure disorder.    PT Comments    Pt able to stand with min to mod assist x2 today and use of RW; pt requiring max cueing and physical assist to maintain NWB'ing with activities.  Attempted ambulation with use of RW and assist to maintain NWB'ing R LE but pt unable to advance L LE with multiple tries and 2 assist.  Pt also fatigued by 4th trial standing with RW limiting standing activities.  Extra time required for activities d/t pt inconsistent with following 1 step commands.  Will continue to progress pt with strengthening, transfers, and progress/attempt ambulation when appropriate.   Follow Up Recommendations  SNF     Equipment Recommendations  Rolling walker with 5" wheels;3in1 (PT)    Recommendations for Other Services       Precautions / Restrictions Precautions Precautions: Fall Required Braces or Orthoses: Other Brace/Splint Other Brace/Splint: R ankle splint Restrictions Weight Bearing Restrictions: Yes RLE Weight Bearing: Non weight bearing    Mobility  Bed Mobility Overal bed mobility: Needs Assistance Bed Mobility: Supine to Sit;Sit to Supine     Supine to sit: Mod assist Sit to supine: Mod assist   General bed mobility comments: assist for trunk and R LE; vc's for technique; increased time to perform; min assist to scoot forward on edge of bed  Transfers Overall transfer level: Needs assistance Equipment used: Rolling walker (2 wheeled) Transfers: Sit to/from Stand Sit to Stand: Min assist;Mod assist;+2 physical assistance         General transfer comment: x4 trials; physical assist and max cueing for R LE to maintain  NWB'ing; vc's and tactile cues for hand and feet placement; pt fatiguing each stand and only able to stand briefly for last stand  Ambulation/Gait             General Gait Details: pt given max vc's and provided max physical assist to maintain NWB'ing R LE but pt unable to advance L LE with 2 assist and use of walker (x3 trials)   Stairs            Wheelchair Mobility    Modified Rankin (Stroke Patients Only)       Balance Overall balance assessment: Needs assistance;History of Falls Sitting-balance support: Bilateral upper extremity supported;Feet unsupported Sitting balance-Leahy Scale: Fair     Standing balance support: Bilateral upper extremity supported (on RW) Standing balance-Leahy Scale: Poor Standing balance comment: posterior lean initially upon standing requiring vc's to shift weight forward while still maintaining NWB'ing R LE                    Cognition Arousal/Alertness: Awake/alert Behavior During Therapy: WFL for tasks assessed/performed Overall Cognitive Status: No family/caregiver present to determine baseline cognitive functioning (Oriented to name)                 General Comments: Difficult to understand pt's speech    Exercises      General Comments   Nursing cleared pt for participation in physical therapy.  Pt agreeable to PT session.      Pertinent Vitals/Pain Pain Assessment: Faces Faces Pain Scale: No hurt Pain Location: R ankle Pain Intervention(s):  Premedicated before session  Vitals (HR and O2 on room air) stable and WFL throughout treatment session.    Home Living                      Prior Function            PT Goals (current goals can now be found in the care plan section) Acute Rehab PT Goals Patient Stated Goal: Pt did not state any goals Progress towards PT goals: Progressing toward goals    Frequency    BID      PT Plan Current plan remains appropriate    Co-evaluation              End of Session Equipment Utilized During Treatment: Gait belt Activity Tolerance: Patient limited by fatigue Patient left: in bed;with call bell/phone within reach;with bed alarm set (R LE elevated via pillow; L heel elevated via towel roll; nursing reporting no use of L SCD d/t causing issues (pt appearing to get agitated with use))     Time: [id number] PT Time Calculation (min) (ACUTE ONLY): 38 min  Charges:  $Therapeutic Activity: 38-52 mins                    G CodesHendricks Limes:      Tamyra Fojtik 04/13/2016, 10:38 AM Hendricks LimesEmily Sani Madariaga, PT [phone]

## 2016-04-13 NOTE — Progress Notes (Signed)
[employer] - [place] at [facility]                                                                                                                                                                                  Patient Demographics   Mariah Jimenez, is a 71 y.o. female, DOB - [birth year], ZOX:[id number]  Admit date - 04/08/2016   Admitting Physician Arnaldo Natal, MD  Outpatient Primary MD for the patient is [facility]   LOS - 4  Subjective: Her pain is under control patient has no complaints    Review of Systems:   CONSTITUTIONAL: pt with mental retardation   Vitals:   Vitals:   04/12/16 0809 04/12/16 1900 04/13/16 0419 04/13/16 0837  BP: (!) 101/51 (!) 94/54 (!) 120/52 (!) 127/48  Pulse: 100 (!) 103 98 93  Resp: 18 18 16 20   Temp: 98.1 F (36.7 C) 97.7 F (36.5 C) 97.9 F (36.6 C) 98.1 F (36.7 C)  TempSrc: Oral Oral Oral Oral  SpO2: 94% 100% 100% 96%  Weight:   227 lb 14.4 oz (103.4 kg)   Height:        Wt Readings from Last 3 Encounters:  04/13/16 227 lb 14.4 oz (103.4 kg)  05/20/13 250 lb (113.4 kg)     Intake/Output Summary (Last 24 hours) at 04/13/16 1548 Last data filed at 04/13/16 0900  Gross per 24 hour  Intake              743 ml  Output                0 ml  Net              743 ml    Physical Exam:   GENERAL: Pleasant-appearing in no apparent distress.  HEAD, EYES, EARS, NOSE AND THROAT: Atraumatic, normocephalic. Extraocular muscles are intact. Pupils equal and reactive to light. Sclerae anicteric. No conjunctival injection. No oro-pharyngeal erythema.  NECK: Supple. There is no jugular venous distention. No bruits, no lymphadenopathy, no thyromegaly.  HEART: Regular rate and rhythm,. No murmurs, no rubs, no clicks.  LUNGS: Clear to auscultation bilaterally. No rales or rhonchi. No wheezes.  ABDOMEN: Soft, flat, nontender, nondistended. Has good bowel sounds. No hepatosplenomegaly appreciated.   EXTREMITIES: No evidence of any cyanosis, clubbing, or peripheral edema.  +2 pedal and radial pulses bilaterally.  NEUROLOGIC: The patient is  awake, , with no focal motor or sensory deficits appreciated bilaterally.  SKIN: Moist and warm with no rashes appreciated.  Psych: Not anxious, depressed LN: No inguinal LN enlargement    Antibiotics   Anti-infectives    Start  Dose/Rate Route Frequency Ordered Stop   04/09/16 1300  vancomycin (VANCOCIN) IVPB 1000 mg/200 mL premix     1,000 mg 200 mL/hr over 60 Minutes Intravenous  Once 04/09/16 1254 04/09/16 1359   04/09/16 1254  vancomycin (VANCOCIN) 1-5 GM/200ML-% IVPB    Comments:  Eli HoseBuchanan, Leslie: cabinet override      04/09/16 1254 04/10/16 0059   04/09/16 0600  vancomycin (VANCOCIN) 1,500 mg in sodium chloride 0.9 % 500 mL IVPB     1,500 mg 250 mL/hr over 120 Minutes Intravenous  Once 04/09/16 0553 04/09/16 0814   04/09/16 0549  ceFAZolin (ANCEF) IVPB 2g/100 mL premix  Status:  Discontinued     2 g 200 mL/hr over 30 Minutes Intravenous 30 min pre-op 04/09/16 0552 04/09/16 0553   04/03/16 0100  vancomycin (VANCOCIN) IVPB 1000 mg/200 mL premix     1,000 mg 200 mL/hr over 60 Minutes Intravenous Every 12 hours 04/09/16 1700 04/03/16 1259      Medications   Scheduled Meds: . Chlorhexidine Gluconate Cloth  6 each Topical Q0600  . docusate sodium  100 mg Oral BID  . enoxaparin (LOVENOX) injection  40 mg Subcutaneous Q24H  . levETIRAcetam  1,500 mg Oral BID  . metoprolol tartrate  25 mg Oral BID  . mupirocin ointment  1 application Nasal BID  . pantoprazole  40 mg Oral Daily  . phenobarbital  32.4 mg Oral QHS  . phenytoin  300 mg Oral QHS  . sodium chloride flush  3 mL Intravenous Q12H   Continuous Infusions: . dextrose 5 % and 0.9% NaCl 100 mL/hr at 04/10/16 0133   PRN Meds:.acetaminophen **OR** acetaminophen, bisacodyl, diphenhydrAMINE, HYDROmorphone (DILAUDID) injection, LORazepam, magnesium hydroxide, metoCLOPramide  **OR** metoCLOPramide (REGLAN) injection, ondansetron **OR** ondansetron (ZOFRAN) IV, oxyCODONE, sodium phosphate   Data Review:   Micro Results Recent Results (from the past 240 hour(s))  Surgical pcr screen     Status: Abnormal   Collection Time: 04/09/16  3:55 AM  Result Value Ref Range Status   MRSA, PCR POSITIVE (A) NEGATIVE Final    Comment: CRITICAL RESULT CALLED TO, READ BACK BY AND VERIFIED WITH: TRACY TOOMBS AT 0519 ON 04/09/16 [facility].    Staphylococcus aureus POSITIVE (A) NEGATIVE Final    Comment:        The Xpert SA Assay (FDA approved for NASAL specimens in patients over 70 years of age), is one component of a comprehensive surveillance program.  Test performance has been validated by [facility] for patients greater than or equal to 70 year old. It is not intended to diagnose infection nor to guide or monitor treatment.     Radiology Reports Dg Tibia/fibula Right  Result Date: 04/09/2016 CLINICAL DATA:  Status post surgical internal fixation of the right tibial fracture. EXAM: DG C-ARM 61-120 MIN; RIGHT TIBIA AND FIBULA - 2 VIEW FLUOROSCOPY TIME:  2 minutes 28 seconds. COMPARISON:  Radiographs of same day. FINDINGS: Six intraoperative fluoroscopic images demonstrate intramedullary rod fixation of comminuted distal right tibial fracture. Improved alignment of fracture components is noted. Continued presence of comminuted and mildly displaced distal right fibular fracture is noted. IMPRESSION: Status post intramedullary rod fixation of comminuted distal right tibial fracture. Electronically Signed   By: Lupita RaiderJames  Green Jr, M.D.   On: 04/09/2016 15:30   Dg Tibia/fibula Right  Result Date: 04/09/2016 CLINICAL DATA:  Fall today with injury to ankle in right lower leg. Pain. EXAM: RIGHT TIBIA AND FIBULA - 2 VIEW COMPARISON:  None. FINDINGS: Comminuted displaced fractures  of the distal tibia and fibular diaphysis. There is 14 mm lateral displacement of distal tibial fragment  and 8 mm lateral displacement of distal fibular fragment. Mild apex posterior angulation of the tibial fracture. Knee alignment is maintained. Diffuse soft tissue edema. IMPRESSION: Comminuted displaced distal tibia and fibular shaft fractures. Electronically Signed   By: Rubye Oaks M.D.   On: 04/09/2016 00:01   Dg Ankle Complete Right  Result Date: 04/09/2016 CLINICAL DATA:  Fall today injuring right ankle and lower leg. Twisting injury. EXAM: RIGHT ANKLE - COMPLETE 3+ VIEW COMPARISON:  None. FINDINGS: Comminuted displaced fractures of the distal tibia and fibular diaphysis. There is 14 mm lateral displacement of distal tibial fragment and 8 mm lateral displacement of distal fibular fragment. Mild apex posterior angulation of the tibial fracture. No extension to the ankle joint. Ankle mortise is preserved. Diffuse soft tissue edema. IMPRESSION: Comminuted displaced distal tibia and fibular shaft fractures. No intra-articular extension. Electronically Signed   By: Rubye Oaks M.D.   On: 04/09/2016 00:00   Dg Tibia/fibula Right Port  Result Date: 04/09/2016 CLINICAL DATA:  Distal tibia fracture, postreduction. EXAM: PORTABLE RIGHT TIBIA AND FIBULA - 2 VIEW COMPARISON:  Pre reduction radiographs 2 hours prior. FINDINGS: Grossly unchanged alignment of comminuted distal tibia and fibular shaft fractures compared to pre reduction views. Persistent angulation and displacement. Overlying cast material in place. IMPRESSION: Grossly unchanged alignment of comminuted angulated distal tibia and fibular fractures. Electronically Signed   By: Rubye Oaks M.D.   On: 04/09/2016 01:49   Dg C-arm 61-120 Min  Result Date: 04/09/2016 CLINICAL DATA:  Status post surgical internal fixation of the right tibial fracture. EXAM: DG C-ARM 61-120 MIN; RIGHT TIBIA AND FIBULA - 2 VIEW FLUOROSCOPY TIME:  2 minutes 28 seconds. COMPARISON:  Radiographs of same day. FINDINGS: Six intraoperative fluoroscopic images  demonstrate intramedullary rod fixation of comminuted distal right tibial fracture. Improved alignment of fracture components is noted. Continued presence of comminuted and mildly displaced distal right fibular fracture is noted. IMPRESSION: Status post intramedullary rod fixation of comminuted distal right tibial fracture. Electronically Signed   By: Lupita Raider, M.D.   On: 04/09/2016 15:30     CBC  Recent Labs Lab 04/09/16 0133 04/10/16 0523 04/11/16 0657  WBC 7.5 4.9 7.2  HGB 11.1* 9.5* 10.2*  HCT 32.2* 27.7* 29.9*  PLT 190 164 166  MCV 89.0 88.6 89.9  MCH 30.7 30.3 30.6  MCHC 34.4 34.2 34.0  RDW 13.9 13.5 13.8  LYMPHSABS 1.3 1.6  --   MONOABS 0.3 0.4  --   EOSABS 0.1 0.2  --   BASOSABS 0.0 0.0  --     Chemistries   Recent Labs Lab 04/09/16 0133 04/10/16 0523 04/11/16 0554 04/12/16 0527  NA 138 140 139 139  K 4.2 4.4 4.6 4.0  CL 108 109 107 107  CO2 24 28 26 27   GLUCOSE 115* 109* 92 97  BUN 12 8 9 15   CREATININE 0.59 0.46 0.52 0.49  CALCIUM 8.9 8.4* 8.9 8.5*  AST 26  --   --   --   ALT 18  --   --   --   ALKPHOS 216*  --   --   --   BILITOT 0.5  --   --   --    ------------------------------------------------------------------------------------------------------------------ estimated creatinine clearance is 85 mL/min (by C-G formula based on SCr of 0.49 mg/dL). ------------------------------------------------------------------------------------------------------------------ No results for input(s): HGBA1C in the last 72 hours. ------------------------------------------------------------------------------------------------------------------  No results for input(s): CHOL, HDL, LDLCALC, TRIG, CHOLHDL, LDLDIRECT in the last 72 hours. ------------------------------------------------------------------------------------------------------------------ No results for input(s): TSH, T4TOTAL, T3FREE, THYROIDAB in the last 72 hours.  Invalid input(s):  FREET3 ------------------------------------------------------------------------------------------------------------------ No results for input(s): VITAMINB12, FOLATE, FERRITIN, TIBC, IRON, RETICCTPCT in the last 72 hours.  Coagulation profile  Recent Labs Lab 04/09/16 0133  INR 1.11    No results for input(s): DDIMER in the last 72 hours.  Cardiac Enzymes No results for input(s): CKMB, TROPONINI, MYOGLOBIN in the last 168 hours.  Invalid input(s): CK ------------------------------------------------------------------------------------------------------------------ Invalid input(s): POCBNP    Assessment & Plan   This is a 71 year old female admitted for right fibula tibial fracture. 1. Tibia-fibula fracture: Right leg; immobilized.  S/p ORIF   Non weight bearing to right leg  awaiting passer for NH placment 2. Seizure disorder: continue home sz medications no seizure noted here 3. DVT prophylaxis: Lovenox post surgery 4. GI prophylaxis: None        Code Status Orders        Start     Ordered   04/09/16 0311  Full code  Continuous     04/09/16 0310    Code Status History    Date Active Date Inactive Code Status Order ID Comments User Context   This patient has a current code status but no historical code status.           Consults  ortho   DVT Prophylaxis  Lovenox   Lab Results  Component Value Date   PLT 166 04/11/2016     Time Spent in minutes    Greater than 50% of time spent in care coordination and counseling patient regarding the condition and plan of care.   Auburn Bilberry M.D on 04/13/2016 at 3:48 PM  Between 7am to 6pm - Pager - [phone]  After 6pm go to [email or web address] - password [email or web address] [facility]  [facility]   Office  [phone]

## 2016-04-13 NOTE — Progress Notes (Addendum)
[facility] patient has CAP services at home and will lose CAP if she goes to a SNF. Per Gala Romneyoug patient can come to SNF for 30 days and D/C home and be put back on the waiting list for CAP services but is does not guarantee she will get the services back. Clinical Child psychotherapistocial Worker (CSW) contacted patient's sister Ronal FearMary Laakso and made her aware of above. Per Corrie DandyMary she will think about it and come to [facility] this afternoon and meet with CSW and discuss decision.   CSW attempted to meet with patient's sister this afternoon to get her decision about SNF or home however she was not in the room. CSW left sister a Engineer, technical salesvoicemail.   [employer]orporatedBailey Corlene Sabia, LCSW [phone]

## 2016-04-13 NOTE — Progress Notes (Signed)
Patient has active Medicaid coverage ([employer]) per [employer].  Unable to determine active Medicare coverage status as date of birth does not match payer's records.

## 2016-04-13 NOTE — Progress Notes (Signed)
PASARR has went to level 2. PASARR evaluator will have to come out an assess patient in person. It could take several days for PASARR number to be assigned.   [employer]Bailey Leiana Rund, LCSW [phone]

## 2016-04-13 NOTE — Progress Notes (Signed)
Subjective: 4 Days Post-Op Procedure(s) (LRB): INTRAMEDULLARY (IM) NAIL TIBIAL (Right) Patient is a difficult historian. Does not appear to be in any distress. Denies any abd pain. Tolerating po well. We will continue therapy today.  Plan is to go Skilled nursing facility after hospital stay.  Objective: Vital signs in last 24 hours: Temp:  [97.7 F (36.5 C)-98.1 F (36.7 C)] 98.1 F (36.7 C) (01/02 0837) Pulse Rate:  [93-103] 93 (01/02 0837) Resp:  [16-20] 20 (01/02 0837) BP: (94-127)/(48-54) 127/48 (01/02 0837) SpO2:  [96 %-100 %] 96 % (01/02 0837) Weight:  [103.4 kg (227 lb 14.4 oz)] 103.4 kg (227 lb 14.4 oz) (01/02 0419)  Intake/Output from previous day: 01/01 0701 - 01/02 0700 In: 983 [P.O.:980; I.V.:3] Out: -  Intake/Output this shift: Total I/O In: 240 [P.O.:240] Out: -    Recent Labs  04/11/16 0657  HGB 10.2*    Recent Labs  04/11/16 0657  WBC 7.2  RBC 3.33*  HCT 29.9*  PLT 166    Recent Labs  04/11/16 0554 04/12/16 0527  NA 139 139  K 4.6 4.0  CL 107 107  CO2 26 27  BUN 9 15  CREATININE 0.52 0.49  GLUCOSE 92 97  CALCIUM 8.9 8.5*   No results for input(s): LABPT, INR in the last 72 hours.  EXAM General - Patient is Alert and Appropriate Extremity - Neurovascular intact Sensation intact distally Intact pulses distally Compartment soft Dressing - dressing C/D/I, no drainage and posterior splint intact with ace wrap. incision checked. mild bloody drainage. continue with ace wrap and 4x4 gauze with posterior splint Motor Function - intact, moving toes well on exam.   Past Medical History:  Diagnosis Date  . Mental retardation   . Seizures (HCC)     Assessment/Plan:   4 Days Post-Op Procedure(s) (LRB): INTRAMEDULLARY (IM) NAIL TIBIAL (Right) Active Problems:   Tibia/fibula fracture  Estimated body mass index is 33.65 kg/m as calculated from the following:   Height as of this encounter: 5\' 9"  (1.753 m).   Weight as of this  encounter: 103.4 kg (227 lb 14.4 oz). Advance diet Up with therapy   Labs and VS are stable Plan on discharge to SNF, when bed is available. Follow up with [facility] in 2 weeks for staple removal/dressing change  DVT Prophylaxis - Lovenox Non-weight-Bearing right lower extremity  J. Horris LatinoLance McGhee, PA-C [facility] 04/13/2016, 10:16 AM

## 2016-04-13 NOTE — Progress Notes (Signed)
Physical Therapy Treatment Patient Details Name: Mariah Jimenez MRN: [id number] DOB: [birth year] Today's Date: 04/13/2016    History of Present Illness Patient is a 71 y.o. female admitted after mechanical fall in the bathroom, acquiring a R tibia and fibula fx. ORIF performed, and patient NWB on R. PMH includes mental retardation and seizure disorder.    PT Comments    Pt able to follow commands a little better this afternoon in regards to maintaining NWB'ing status with functional mobility and positioning for transfers (but pt still requiring physical assist and max cueing for both).  Will continue to progress pt with strengthening, transfers, and progress/attempt gait as safe/per pt's ability to maintain NWB'ing status.   Follow Up Recommendations  SNF     Equipment Recommendations  Rolling walker with 5" wheels;3in1 (PT)    Recommendations for Other Services       Precautions / Restrictions Precautions Precautions: Fall Required Braces or Orthoses: Other Brace/Splint Other Brace/Splint: R ankle splint Restrictions Weight Bearing Restrictions: Yes RLE Weight Bearing: Non weight bearing    Mobility  Bed Mobility Overal bed mobility: Needs Assistance Bed Mobility: Supine to Sit;Sit to Supine     Supine to sit: Mod assist Sit to supine: Mod assist   General bed mobility comments: assist for trunk and R LE; vc's for technique; increased time to perform; min assist to scoot forward on edge of bed  Transfers Overall transfer level: Needs assistance Equipment used: Rolling walker (2 wheeled) Transfers: Sit to/from Stand Sit to Stand: Min assist;Mod assist;+2 physical assistance         General transfer comment: x5 trials; physical assist and max cueing for R LE to maintain NWB'ing; vc's and tactile cues for hand and feet placement  Ambulation/Gait                 Stairs            Wheelchair Mobility    Modified Rankin (Stroke Patients Only)        Balance Overall balance assessment: Needs assistance;History of Falls Sitting-balance support: Bilateral upper extremity supported;Feet unsupported Sitting balance-Leahy Scale: Fair     Standing balance support: Bilateral upper extremity supported (on RW) Standing balance-Leahy Scale: Poor Standing balance comment: posterior lean initially upon standing requiring vc's to shift weight forward while still maintaining NWB'ing R LE                    Cognition Arousal/Alertness: Awake/alert Behavior During Therapy: WFL for tasks assessed/performed Overall Cognitive Status: No family/caregiver present to determine baseline cognitive functioning (Oriented to name)                 General Comments: Difficult to understand pt's speech    Exercises Total Joint Exercises Short Arc Quad: AAROM;Strengthening;Both;10 reps;Supine Heel Slides: AAROM;Strengthening;Both;10 reps;Supine Hip ABduction/ADduction: AAROM;Strengthening;Both;10 reps;Supine  Vc's and tactile cues required for technique of ex's.   General Comments  Pt agreeable to PT session.      Pertinent Vitals/Pain Pain Assessment: Faces Faces Pain Scale: No hurt Pain Location: R ankle    Home Living                      Prior Function            PT Goals (current goals can now be found in the care plan section) Acute Rehab PT Goals Patient Stated Goal: Pt did not state any goals PT Goal Formulation: With patient Time For Goal Achievement:  04/24/16 Potential to Achieve Goals: Fair Progress towards PT goals: Progressing toward goals    Frequency    BID      PT Plan Current plan remains appropriate    Co-evaluation             End of Session Equipment Utilized During Treatment: Gait belt Activity Tolerance: Patient limited by fatigue Patient left: in bed;with call bell/phone within reach;with bed alarm set (R LE elevated via pillow; L heel elevated via towel roll)     Time:  [id number] PT Time Calculation (min) (ACUTE ONLY): 23 min  Charges:  $Therapeutic Exercise: 8-22 mins $Therapeutic Activity: 8-22 mins                    G CodesHendricks Limes:      Lakin Rhine 04/13/2016, 4:18 PM Hendricks LimesEmily Jerrell Mangel, PT [phone]

## 2016-04-14 DIAGNOSIS — S82201A Unspecified fracture of shaft of right tibia, initial encounter for closed fracture: Secondary | ICD-10-CM | POA: Diagnosis not present

## 2016-04-14 DIAGNOSIS — S82251A Displaced comminuted fracture of shaft of right tibia, initial encounter for closed fracture: Secondary | ICD-10-CM | POA: Diagnosis not present

## 2016-04-14 DIAGNOSIS — R262 Difficulty in walking, not elsewhere classified: Secondary | ICD-10-CM | POA: Diagnosis not present

## 2016-04-14 DIAGNOSIS — G40909 Epilepsy, unspecified, not intractable, without status epilepticus: Secondary | ICD-10-CM | POA: Diagnosis not present

## 2016-04-14 DIAGNOSIS — Z79899 Other long term (current) drug therapy: Secondary | ICD-10-CM | POA: Diagnosis not present

## 2016-04-14 DIAGNOSIS — R000 Tachycardia, unspecified: Secondary | ICD-10-CM | POA: Diagnosis not present

## 2016-04-14 DIAGNOSIS — S82451A Displaced comminuted fracture of shaft of right fibula, initial encounter for closed fracture: Secondary | ICD-10-CM | POA: Diagnosis not present

## 2016-04-14 DIAGNOSIS — F79 Unspecified intellectual disabilities: Secondary | ICD-10-CM | POA: Diagnosis not present

## 2016-04-14 NOTE — Progress Notes (Signed)
Physical Therapy Treatment Patient Details Name: Mariah EhlersBarbara Jimenez MRN: [id number] DOB: 03/24/1946 Today's Date: 04/14/2016    History of Present Illness Patient is a 71 y.o. female admitted after mechanical fall in the bathroom, acquiring a R tibia and fibula fx. ORIF performed, and patient NWB on R. PMH includes mental retardation and seizure disorder.    PT Comments    Pt appearing fatigued this afternoon and declining OOB but agreeable to ex's in bed.  Pt falling asleep during LE ex's limiting session.  Will continue to progress pt with LE strengthening and functional mobility per pt tolerance.   Follow Up Recommendations  SNF     Equipment Recommendations  Rolling walker with 5" wheels;3in1 (PT)    Recommendations for Other Services       Precautions / Restrictions Precautions Precautions: Fall Required Braces or Orthoses: Other Brace/Splint Other Brace/Splint: R ankle splint Restrictions Weight Bearing Restrictions: Yes RLE Weight Bearing: Non weight bearing    Mobility  Bed Mobility               General bed mobility comments: Deferred OOB mobility d/t pt appearing fatigued and falling asleep during ex's.  Transfers                    Ambulation/Gait                 Stairs            Wheelchair Mobility    Modified Rankin (Stroke Patients Only)       Balance                                    Cognition Arousal/Alertness: Awake/alert (but falling asleep during session) Behavior During Therapy: WFL for tasks assessed/performed Overall Cognitive Status: No family/caregiver present to determine baseline cognitive functioning (Oriented to name)                 General Comments: Pt talkative but difficult to understand pt's speech    Exercises Total Joint Exercises Ankle Circles/Pumps: AAROM;Strengthening;Left;20 reps;Supine Short Arc Quad: AAROM;Strengthening;Both;Supine;20 reps Heel Slides:  AAROM;Strengthening;Both;Supine;20 reps Hip ABduction/ADduction: AAROM;Strengthening;Both;Supine;20 reps  Pt requiring tactile and vc's for correct technique of above ex's.    General Comments        Pertinent Vitals/Pain Pain Assessment: Faces Faces Pain Scale: Hurts a little bit Pain Location: R ankle Pain Descriptors / Indicators: Grimacing Pain Intervention(s): Limited activity within patient's tolerance;Monitored during session;Repositioned    Home Living                      Prior Function            PT Goals (current goals can now be found in the care plan section) Acute Rehab PT Goals Patient Stated Goal: Pt did not state any goals Progress towards PT goals: Progressing toward goals (with LE strengthening)    Frequency    BID      PT Plan Current plan remains appropriate    Co-evaluation             End of Session   Activity Tolerance: Patient limited by fatigue Patient left: in bed;with call bell/phone within reach;with bed alarm set (R LE elevated via pillow; L heel elevated via rolled towel roll)     Time: [id number] PT Time Calculation (min) (ACUTE ONLY): 15 min  Charges:  $Therapeutic Exercise: 8-22  mins                    G CodesHendricks Jimenez 04-21-2016, 4:56 PM Mariah Jimenez, PT [phone]

## 2016-04-14 NOTE — Progress Notes (Signed)
Subjective: 5 Days Post-Op Procedure(s) (LRB): INTRAMEDULLARY (IM) NAIL TIBIAL (Right) Patient is a difficult historian. Does not appear to be in any distress. Denies any abd pain. Tolerating po well. We will continue therapy today.  Plan is to go Skilled nursing facility after hospital stay.  Objective: Vital signs in last 24 hours: Temp:  [97.5 F (36.4 C)-98.1 F (36.7 C)] 97.9 F (36.6 C) (01/03 0500) Pulse Rate:  [93-104] 99 (01/03 0500) Resp:  [16-20] 19 (01/03 0500) BP: (125-128)/(47-50) 125/50 (01/03 0500) SpO2:  [96 %] 96 % (01/03 0500) Weight:  [103.4 kg (227 lb 15.3 oz)] 103.4 kg (227 lb 15.3 oz) (01/03 0500)  Intake/Output from previous day: 01/02 0701 - 01/03 0700 In: 240 [P.O.:240] Out: -  Intake/Output this shift: No intake/output data recorded.  No results for input(s): HGB in the last 72 hours. No results for input(s): WBC, RBC, HCT, PLT in the last 72 hours.  Recent Labs  04/12/16 0527  NA 139  K 4.0  CL 107  CO2 27  BUN 15  CREATININE 0.49  GLUCOSE 97  CALCIUM 8.5*   No results for input(s): LABPT, INR in the last 72 hours.  EXAM General - Patient is Alert and Appropriate Extremity - Neurovascular intact Sensation intact distally Intact pulses distally Compartment soft Dressing - dressing C/D/I, no drainage and posterior splint intact with ace wrap. incision checked. mild bloody drainage. continue with ace wrap and 4x4 gauze with posterior splint Motor Function - intact, moving toes well on exam.   Past Medical History:  Diagnosis Date  . Mental retardation   . Seizures (HCC)     Assessment/Plan:   5 Days Post-Op Procedure(s) (LRB): INTRAMEDULLARY (IM) NAIL TIBIAL (Right) Active Problems:   Tibia/fibula fracture  Estimated body mass index is 33.66 kg/m as calculated from the following:   Height as of this encounter: 5\' 9"  (1.753 m).   Weight as of this encounter: 103.4 kg (227 lb 15.3 oz). Advance diet Up with therapy   Labs  and VS are stable Plan on discharge to SNF, when bed is available. Follow up with [facility] in 2 weeks for staple removal/dressing change. Continue Lovenox 40mg  daily x 14 days. Orthopaedics will sign off at this time.  DVT Prophylaxis - Lovenox Non-weight-Bearing right lower extremity  J. Horris LatinoLance Swanson Farnell, PA-C [facility] 04/14/2016, 7:48 AM

## 2016-04-14 NOTE — Progress Notes (Addendum)
PAS[facility] is still pending. Clinical Education officer, museum (CSW) contacted patient's sister Stanton Kidney however she did not answer and a voicemail was left. CSW met with patient's sister [name] at bedside to discuss D/C plan. Per [name] family is in agreement with patient going to [facility] and giving up CAP services. Per sister they can't take care of patient at home like this. Doug admissions coordinator at [facility] is aware of above.    CSW received a call from [place] who reported that she would come assess patient today later this afternoon.   [employer], LCSW [phone]

## 2016-04-14 NOTE — Progress Notes (Signed)
Physical Therapy Treatment Patient Details Name: Mariah Jimenez MRN: [id number] DOB: March 11, 1946 Today's Date: 04/14/2016    History of Present Illness Patient is a 71 y.o. female admitted after mechanical fall in the bathroom, acquiring a R tibia and fibula fx. ORIF performed, and patient NWB on R. PMH includes mental retardation and seizure disorder.    PT Comments    Pt progressing some with decreased assist levels with supine to sit and with sit to stand transfers with RW.  Pt also requiring decreased physical assist to maintain NWB'ing status although pt still requiring constant/consistent vc's to maintain NWB'ing status.  Attempted to trial ambulation but pt unable to advance L LE with 2 assist and use of walker.  Will continue to progress pt with decreasing assist levels with functional mobility and progress to ambulation as appropriate.   Follow Up Recommendations  SNF     Equipment Recommendations  Rolling walker with 5" wheels;3in1 (PT)    Recommendations for Other Services       Precautions / Restrictions Precautions Precautions: Fall Required Braces or Orthoses: Other Brace/Splint Other Brace/Splint: R ankle splint Restrictions Weight Bearing Restrictions: Yes RLE Weight Bearing: Non weight bearing    Mobility  Bed Mobility Overal bed mobility: Needs Assistance Bed Mobility: Supine to Sit;Sit to Supine     Supine to sit: Min assist Sit to supine: Mod assist   General bed mobility comments: assist for trunk supine to sit; assist for trunk and R LE sit to supine; vc's for technique and to use side rail supine to sit; increased time to perform; CGA to scoot forward on edge of bed  Transfers Overall transfer level: Needs assistance Equipment used: Rolling walker (2 wheeled) Transfers: Sit to/from Stand Sit to Stand: Min assist;+2 physical assistance         General transfer comment: x5 trials; minimal physical assist but constant cueing for R LE to maintain  NWB'ing; vc's and tactile cues for hand and feet placement  Ambulation/Gait             General Gait Details: pt given max vc's and provided physical assist to maintain NWB'ing R LE but pt unable to advance L LE with 2 assist and use of walker   Stairs            Wheelchair Mobility    Modified Rankin (Stroke Patients Only)       Balance Overall balance assessment: Needs assistance;History of Falls Sitting-balance support: Bilateral upper extremity supported;Feet unsupported Sitting balance-Leahy Scale: Good     Standing balance support: Bilateral upper extremity supported (on RW) Standing balance-Leahy Scale: Poor Standing balance comment: posterior lean initially upon standing requiring vc's to shift weight forward while still maintaining NWB'ing R LE                    Cognition Arousal/Alertness: Awake/alert Behavior During Therapy: WFL for tasks assessed/performed Overall Cognitive Status: No family/caregiver present to determine baseline cognitive functioning (Oriented to name)                 General Comments: Pt talkative but difficult to understand pt's speech    Exercises      General Comments  Pt agreeable to PT session.  NT present during session and cleaned pt up in standing (and donned new brief) d/t bowel incontinence.      Pertinent Vitals/Pain Pain Assessment: Faces Faces Pain Scale: No hurt Pain Location: R ankle Pain Intervention(s): Limited activity within patient's tolerance;Monitored during  session;Repositioned    Home Living                      Prior Function            PT Goals (current goals can now be found in the care plan section) Acute Rehab PT Goals Patient Stated Goal: Pt did not state any goals Progress towards PT goals: Progressing toward goals    Frequency    BID      PT Plan Current plan remains appropriate    Co-evaluation             End of Session Equipment Utilized  During Treatment: Gait belt Activity Tolerance: Patient limited by fatigue Patient left: in bed;with call bell/phone within reach;with bed alarm set;with family/visitor present (R LE elevated via pillow; L heel elevated via towel roll)     Time: 1010-1035 PT Time Calculation (min) (ACUTE ONLY): 25 min  Charges:  $Therapeutic Activity: 23-37 mins                    G CodesHendricks Jimenez:      Mariah Jimenez 04/14/2016, 10:48 AM Mariah LimesEmily Josehua Hammar, PT [phone]

## 2016-04-14 NOTE — Progress Notes (Signed)
[facility]. Clinical Child psychotherapistocial Worker (CSW) signed PASARR paper work. CSW will continue to follow and assist as needed.   Baker Hughes IncorporatedBailey Severa Jeremiah, LCSw [phone]

## 2016-04-14 NOTE — Progress Notes (Signed)
[facility] [place] [facility]                                                                                                                                                                                  Patient Demographics   Mariah Jimenez, is a 71 y.o. female, DOB - 1946-03-13, NWG:[id number]  Admit date - 04/08/2016   Admitting Physician Arnaldo Natal, MD  Outpatient Primary MD for the patient is [facility]   LOS - 5  Subjective: Awaiting passar to go to skilled nursing facility    Review of Systems:   CONSTITUTIONAL: pt with mental retardation   Vitals:   Vitals:   04/13/16 2032 04/14/16 0500 04/14/16 0807 04/14/16 1104  BP: (!) 128/47 (!) 125/50 (!) 130/50 138/62  Pulse: (!) 104 99 92 90  Resp: 16 19 18    Temp: 97.5 F (36.4 C) 97.9 F (36.6 C) 98.2 F (36.8 C)   TempSrc: Oral Oral Oral   SpO2: 96% 96% 100%   Weight:  227 lb 15.3 oz (103.4 kg)    Height:        Wt Readings from Last 3 Encounters:  04/14/16 227 lb 15.3 oz (103.4 kg)  05/20/13 250 lb (113.4 kg)    No intake or output data in the 24 hours ending 04/14/16 1329  Physical Exam:   GENERAL: Pleasant-appearing in no apparent distress.  HEAD, EYES, EARS, NOSE AND THROAT: Atraumatic, normocephalic. Extraocular muscles are intact. Pupils equal and reactive to light. Sclerae anicteric. No conjunctival injection. No oro-pharyngeal erythema.  NECK: Supple. There is no jugular venous distention. No bruits, no lymphadenopathy, no thyromegaly.  HEART: Regular rate and rhythm,. No murmurs, no rubs, no clicks.  LUNGS: Clear to auscultation bilaterally. No rales or rhonchi. No wheezes.  ABDOMEN: Soft, flat, nontender, nondistended. Has good bowel sounds. No hepatosplenomegaly appreciated.  EXTREMITIES: No evidence of any cyanosis, clubbing, or peripheral edema.  +2 pedal and radial pulses bilaterally.  NEUROLOGIC: The patient is  awake, , with no focal  motor or sensory deficits appreciated bilaterally.  SKIN: Moist and warm with no rashes appreciated.  Psych: Not anxious, depressed LN: No inguinal LN enlargement    Antibiotics   Anti-infectives    Start     Dose/Rate Route Frequency Ordered Stop   04/09/16 1300  vancomycin (VANCOCIN) IVPB 1000 mg/200 mL premix     1,000 mg 200 mL/hr over 60 Minutes Intravenous  Once 04/09/16 1254 04/09/16 1359   04/09/16 1254  vancomycin (VANCOCIN) 1-5 GM/200ML-% IVPB    Comments:  Eli Hose: cabinet override      04/09/16 1254 04/10/16 0059  04/09/16 0600  vancomycin (VANCOCIN) 1,500 mg in sodium chloride 0.9 % 500 mL IVPB     1,500 mg 250 mL/hr over 120 Minutes Intravenous  Once 04/09/16 0553 04/09/16 0814   04/09/16 0549  ceFAZolin (ANCEF) IVPB 2g/100 mL premix  Status:  Discontinued     2 g 200 mL/hr over 30 Minutes Intravenous 30 min pre-op 04/09/16 0552 04/09/16 0553   04/03/16 0100  vancomycin (VANCOCIN) IVPB 1000 mg/200 mL premix     1,000 mg 200 mL/hr over 60 Minutes Intravenous Every 12 hours 04/09/16 1700 04/03/16 1259      Medications   Scheduled Meds: . docusate sodium  100 mg Oral BID  . enoxaparin (LOVENOX) injection  40 mg Subcutaneous Q24H  . levETIRAcetam  1,500 mg Oral BID  . metoprolol tartrate  25 mg Oral BID  . pantoprazole  40 mg Oral Daily  . phenobarbital  32.4 mg Oral QHS  . phenytoin  300 mg Oral QHS  . sodium chloride flush  3 mL Intravenous Q12H   Continuous Infusions:  PRN Meds:.acetaminophen **OR** acetaminophen, bisacodyl, diphenhydrAMINE, HYDROmorphone (DILAUDID) injection, LORazepam, magnesium hydroxide, metoCLOPramide **OR** metoCLOPramide (REGLAN) injection, ondansetron **OR** ondansetron (ZOFRAN) IV, oxyCODONE, sodium phosphate   Data Review:   Micro Results Recent Results (from the past 240 hour(s))  Surgical pcr screen     Status: Abnormal   Collection Time: 04/09/16  3:55 AM  Result Value Ref Range Status   MRSA, PCR POSITIVE (A)  NEGATIVE Final    Comment: CRITICAL RESULT CALLED TO, READ BACK BY AND VERIFIED WITH: TRACY TOOMBS AT 0519 ON 04/09/16 [facility].    Staphylococcus aureus POSITIVE (A) NEGATIVE Final    Comment:        The Xpert SA Assay (FDA approved for NASAL specimens in patients over 5 years of age), is one component of a comprehensive surveillance program.  Test performance has been validated by [facility] for patients greater than or equal to 33 year old. It is not intended to diagnose infection nor to guide or monitor treatment.     Radiology Reports Dg Tibia/fibula Right  Result Date: 04/09/2016 CLINICAL DATA:  Status post surgical internal fixation of the right tibial fracture. EXAM: DG C-ARM 61-120 MIN; RIGHT TIBIA AND FIBULA - 2 VIEW FLUOROSCOPY TIME:  2 minutes 28 seconds. COMPARISON:  Radiographs of same day. FINDINGS: Six intraoperative fluoroscopic images demonstrate intramedullary rod fixation of comminuted distal right tibial fracture. Improved alignment of fracture components is noted. Continued presence of comminuted and mildly displaced distal right fibular fracture is noted. IMPRESSION: Status post intramedullary rod fixation of comminuted distal right tibial fracture. Electronically Signed   By: Lupita Raider, M.D.   On: 04/09/2016 15:30   Dg Tibia/fibula Right  Result Date: 04/09/2016 CLINICAL DATA:  Fall today with injury to ankle in right lower leg. Pain. EXAM: RIGHT TIBIA AND FIBULA - 2 VIEW COMPARISON:  None. FINDINGS: Comminuted displaced fractures of the distal tibia and fibular diaphysis. There is 14 mm lateral displacement of distal tibial fragment and 8 mm lateral displacement of distal fibular fragment. Mild apex posterior angulation of the tibial fracture. Knee alignment is maintained. Diffuse soft tissue edema. IMPRESSION: Comminuted displaced distal tibia and fibular shaft fractures. Electronically Signed   By: Rubye Oaks M.D.   On: 04/09/2016 00:01   Dg Ankle  Complete Right  Result Date: 04/09/2016 CLINICAL DATA:  Fall today injuring right ankle and lower leg. Twisting injury. EXAM: RIGHT ANKLE - COMPLETE 3+ VIEW COMPARISON:  None. FINDINGS: Comminuted displaced fractures of the distal tibia and fibular diaphysis. There is 14 mm lateral displacement of distal tibial fragment and 8 mm lateral displacement of distal fibular fragment. Mild apex posterior angulation of the tibial fracture. No extension to the ankle joint. Ankle mortise is preserved. Diffuse soft tissue edema. IMPRESSION: Comminuted displaced distal tibia and fibular shaft fractures. No intra-articular extension. Electronically Signed   By: Rubye Oaks M.D.   On: 04/09/2016 00:00   Dg Tibia/fibula Right Port  Result Date: 04/09/2016 CLINICAL DATA:  Distal tibia fracture, postreduction. EXAM: PORTABLE RIGHT TIBIA AND FIBULA - 2 VIEW COMPARISON:  Pre reduction radiographs 2 hours prior. FINDINGS: Grossly unchanged alignment of comminuted distal tibia and fibular shaft fractures compared to pre reduction views. Persistent angulation and displacement. Overlying cast material in place. IMPRESSION: Grossly unchanged alignment of comminuted angulated distal tibia and fibular fractures. Electronically Signed   By: Rubye Oaks M.D.   On: 04/09/2016 01:49   Dg C-arm 61-120 Min  Result Date: 04/09/2016 CLINICAL DATA:  Status post surgical internal fixation of the right tibial fracture. EXAM: DG C-ARM 61-120 MIN; RIGHT TIBIA AND FIBULA - 2 VIEW FLUOROSCOPY TIME:  2 minutes 28 seconds. COMPARISON:  Radiographs of same day. FINDINGS: Six intraoperative fluoroscopic images demonstrate intramedullary rod fixation of comminuted distal right tibial fracture. Improved alignment of fracture components is noted. Continued presence of comminuted and mildly displaced distal right fibular fracture is noted. IMPRESSION: Status post intramedullary rod fixation of comminuted distal right tibial fracture.  Electronically Signed   By: Lupita Raider, M.D.   On: 04/09/2016 15:30     CBC  Recent Labs Lab 04/09/16 0133 04/10/16 0523 04/11/16 0657  WBC 7.5 4.9 7.2  HGB 11.1* 9.5* 10.2*  HCT 32.2* 27.7* 29.9*  PLT 190 164 166  MCV 89.0 88.6 89.9  MCH 30.7 30.3 30.6  MCHC 34.4 34.2 34.0  RDW 13.9 13.5 13.8  LYMPHSABS 1.3 1.6  --   MONOABS 0.3 0.4  --   EOSABS 0.1 0.2  --   BASOSABS 0.0 0.0  --     Chemistries   Recent Labs Lab 04/09/16 0133 04/10/16 0523 04/11/16 0554 04/12/16 0527  NA 138 140 139 139  K 4.2 4.4 4.6 4.0  CL 108 109 107 107  CO2 24 28 26 27   GLUCOSE 115* 109* 92 97  BUN 12 8 9 15   CREATININE 0.59 0.46 0.52 0.49  CALCIUM 8.9 8.4* 8.9 8.5*  AST 26  --   --   --   ALT 18  --   --   --   ALKPHOS 216*  --   --   --   BILITOT 0.5  --   --   --    ------------------------------------------------------------------------------------------------------------------ estimated creatinine clearance is 85 mL/min (by C-G formula based on SCr of 0.49 mg/dL). ------------------------------------------------------------------------------------------------------------------ No results for input(s): HGBA1C in the last 72 hours. ------------------------------------------------------------------------------------------------------------------ No results for input(s): CHOL, HDL, LDLCALC, TRIG, CHOLHDL, LDLDIRECT in the last 72 hours. ------------------------------------------------------------------------------------------------------------------ No results for input(s): TSH, T4TOTAL, T3FREE, THYROIDAB in the last 72 hours.  Invalid input(s): FREET3 ------------------------------------------------------------------------------------------------------------------ No results for input(s): VITAMINB12, FOLATE, FERRITIN, TIBC, IRON, RETICCTPCT in the last 72 hours.  Coagulation profile  Recent Labs Lab 04/09/16 0133  INR 1.11    No results for input(s): DDIMER in the last  72 hours.  Cardiac Enzymes No results for input(s): CKMB, TROPONINI, MYOGLOBIN in the last 168 hours.  Invalid input(s): CK ------------------------------------------------------------------------------------------------------------------ Invalid input(s): POCBNP  Assessment & Plan   This is a 71 year old female admitted for right fibula tibial fracture. 1. Tibia-fibula fracture: Right leg; immobilized.  S/p ORIF   Non weight bearing to right leg  awaiting passer for NH placment 2. Seizure disorder: continue home sz medications no seizure noted here 3. DVT prophylaxis: Lovenox post surgery 4. GI prophylaxis: None        Code Status Orders        Start     Ordered   04/09/16 0311  Full code  Continuous     04/09/16 0310    Code Status History    Date Active Date Inactive Code Status Order ID Comments User Context   This patient has a current code status but no historical code status.           Consults  ortho   DVT Prophylaxis  Lovenox   Lab Results  Component Value Date   PLT 166 04/11/2016     Time Spent in minutes   25min  Greater than 50% of time spent in care coordination and counseling patient regarding the condition and plan of care.   Auburn BilberryPATEL, Coyt Govoni M.D on 04/14/2016 at 1:29 PM  Between 7am to 6pm - Pager - [phone]  After 6pm go to [email or web address] - password [email or web address] [facility]  [facility]   Office  [phone]

## 2016-04-15 DIAGNOSIS — R000 Tachycardia, unspecified: Secondary | ICD-10-CM | POA: Diagnosis not present

## 2016-04-15 DIAGNOSIS — G40909 Epilepsy, unspecified, not intractable, without status epilepticus: Secondary | ICD-10-CM | POA: Diagnosis not present

## 2016-04-15 DIAGNOSIS — F79 Unspecified intellectual disabilities: Secondary | ICD-10-CM | POA: Diagnosis not present

## 2016-04-15 DIAGNOSIS — S82251A Displaced comminuted fracture of shaft of right tibia, initial encounter for closed fracture: Secondary | ICD-10-CM | POA: Diagnosis not present

## 2016-04-15 DIAGNOSIS — M79604 Pain in right leg: Secondary | ICD-10-CM | POA: Diagnosis not present

## 2016-04-15 DIAGNOSIS — S82451A Displaced comminuted fracture of shaft of right fibula, initial encounter for closed fracture: Secondary | ICD-10-CM | POA: Diagnosis not present

## 2016-04-15 DIAGNOSIS — R262 Difficulty in walking, not elsewhere classified: Secondary | ICD-10-CM | POA: Diagnosis not present

## 2016-04-15 DIAGNOSIS — S82201A Unspecified fracture of shaft of right tibia, initial encounter for closed fracture: Secondary | ICD-10-CM | POA: Diagnosis not present

## 2016-04-15 DIAGNOSIS — Z79899 Other long term (current) drug therapy: Secondary | ICD-10-CM | POA: Diagnosis not present

## 2016-04-15 DIAGNOSIS — Z7401 Bed confinement status: Secondary | ICD-10-CM | POA: Diagnosis not present

## 2016-04-15 LAB — CREATININE, SERUM
Creatinine, Ser: 0.52 mg/dL (ref 0.44–1.00)
GFR calc non Af Amer: >60 mL/min (ref >60)

## 2016-04-15 MED ORDER — DOCUSATE SODIUM 100 MG PO CAPS: 100.0000 mg | ORAL_CAPSULE | Freq: Two times a day (BID) | ORAL | 0 refills | Status: DC

## 2016-04-15 NOTE — Progress Notes (Signed)
Physical Therapy Treatment Patient Details Name: Mariah EhlersBarbara Jimenez MRN: [id number] DOB: 03/24/1946 Today's Date: 04/15/2016    History of Present Illness Patient is a 71 y.o. female admitted after mechanical fall in the bathroom, acquiring a R tibia and fibula fx. ORIF performed, and patient NWB on R. PMH includes mental retardation and seizure disorder.    PT Comments    Pt able to maintain NWB R LE status with stand pivot recliner to bed (to L side) but still requiring 2 assist for transfer.  Pt demonstrating improved LE strength with activities overall.  Will continue to progress pt with strengthening and decrease assist levels with functional mobility per pt tolerance.   Follow Up Recommendations  SNF     Equipment Recommendations  Rolling walker with 5" wheels;3in1 (PT)    Recommendations for Other Services       Precautions / Restrictions Precautions Precautions: Fall Required Braces or Orthoses: Other Brace/Splint Other Brace/Splint: R ankle splint Restrictions Weight Bearing Restrictions: Yes RLE Weight Bearing: Non weight bearing    Mobility  Bed Mobility Overal bed mobility: Needs Assistance Bed Mobility: Sit to Supine       Sit to supine: Mod assist;Max assist   General bed mobility comments: assist for trunk and B LE's; 2 assist to scoot up in bed  Transfers Overall transfer level: Needs assistance Equipment used: None Transfers: Stand Pivot Transfers   Stand pivot transfers: Mod assist;Max assist;+2 physical assistance       General transfer comment: stand pivot transfer recliner to bed (to L side with L armrest on recliner removed); vc's for pt to use L UE on bed side rail to assist; vc's for R LE NWB'ing status  Ambulation/Gait             General Gait Details: not appropriate at this time    Stairs            Wheelchair Mobility    Modified Rankin (Stroke Patients Only)       Balance Overall balance assessment: Needs  assistance;History of Falls Sitting-balance support: Bilateral upper extremity supported;Feet unsupported Sitting balance-Leahy Scale: Good                              Cognition Arousal/Alertness: Awake/alert Behavior During Therapy: WFL for tasks assessed/performed Overall Cognitive Status: No family/caregiver present to determine baseline cognitive functioning (Oriented to name)                 General Comments: Pt talkative but difficult to understand pt's speech    Exercises Total Joint Exercises Ankle Circles/Pumps: AAROM;Strengthening;Left;10 reps;Seated Heel Slides: AAROM;Strengthening;Both;10 reps;Supine Hip ABduction/ADduction: AAROM;Strengthening;Both;10 reps;Supine Long Arc Quad: Strengthening;Both;10 reps;Seated (AROM L; AAROM R) General Exercises - Lower Extremity Hip Flexion/Marching: AAROM;Strengthening;Both;10 reps;Seated  Vc's and tactile cues required for technique with ex's.    General Comments  Pt agreeable to PT session.      Pertinent Vitals/Pain Pain Assessment: Faces Faces Pain Scale: No hurt Pain Location: R ankle    Home Living                      Prior Function            PT Goals (current goals can now be found in the care plan section) Acute Rehab PT Goals Patient Stated Goal: To get stronger PT Goal Formulation: With patient Time For Goal Achievement: 04/29/16 Potential to Achieve Goals: Good Progress towards PT  goals: Progressing toward goals    Frequency    BID      PT Plan Current plan remains appropriate    Co-evaluation             End of Session Equipment Utilized During Treatment: Gait belt Activity Tolerance: Patient tolerated treatment well Patient left: in bed;with call bell/phone within reach;with bed alarm set (R LE elevated on pillow; L heel elevated via towel roll)     Time: 1420-1445 PT Time Calculation (min) (ACUTE ONLY): 25 min  Charges:  $Therapeutic Exercise: 8-22  mins $Therapeutic Activity: 8-22 mins                    G CodesHendricks Limes 18-Apr-2016, 3:01 PM Hendricks Limes, PT [phone]

## 2016-04-15 NOTE — Clinical Social Work Placement (Signed)
   CLINICAL SOCIAL WORK PLACEMENT  NOTE  Date:  04/15/2016  Patient Details  Name: Mariah Jimenez MRN: [id number] Date of Birth: 03/24/1946  Clinical Social Work is seeking post-discharge placement for this patient at the Skilled  Nursing Facility level of care (*CSW will initial, date and re-position this form in  chart as items are completed):  Yes   Patient/family provided with [facility] Clinical Social Work Department's list of facilities offering this level of care within the geographic area requested by the patient (or if unable, by the patient's family).  Yes   Patient/family informed of their freedom to choose among providers that offer the needed level of care, that participate in Medicare, Medicaid or managed care program needed by the patient, have an available bed and are willing to accept the patient.  Yes   Patient/family informed of [facility]'s ownership interest in [facility] and [facility], as well as of the fact that they are under no obligation to receive care at these facilities.  PASRR submitted to EDS on 04/11/16     PASRR number received on 04/15/16     Existing PASRR number confirmed on       FL2 transmitted to all facilities in geographic area requested by pt/family on 04/11/16     FL2 transmitted to all facilities within larger geographic area on 04/11/16     Patient informed that his/her managed care company has contracts with or will negotiate with certain facilities, including the following:        Yes   Patient/family informed of bed offers received.  Patient chooses bed at  [facility])     Physician recommends and patient chooses bed at      Patient to be transferred to  [facility] ) on 04/15/16.  Patient [facility] be transferred [facility] facility [facility]     Patient family notified on 04/15/16 of transfer.  Name of family member notified:   (Patient's sister [name] is aware of D/C today. )      PHYSICIAN       Additional Comment:    _______________________________________________ [name], [name], LCSW 04/15/2016, 2:14 PM

## 2016-04-15 NOTE — Plan of Care (Signed)
Problem: Urinary Elimination: Goal: Ability to achieve and maintain adequate urine output will improve Outcome: Completed/Met Date Met: 04/15/16 Pt has met goals for d/c to snf.

## 2016-04-15 NOTE — NC FL2 (Signed)
[place] MEDICAID FL2 LEVEL OF CARE SCREENING TOOL     IDENTIFICATION  Patient Name: Mariah EhlersBarbara Jimenez Birthdate: 03/24/1946 Sex: female Admission Date (Current Location): 04/08/2016  [place]   Facility and Address:  [facility], [address], [place], [place] [address]      Provider Number: [id number]  Attending Physician Name and Address:  Auburn BilberryShreyang Patel, MD  Relative Name and Phone Number:       Current Level of Care: Hospital Recommended Level of Care: Skilled Nursing Facility Prior Approval Number:    Date Approved/Denied:   PASRR Number:    Discharge Plan: SNF    Current Diagnoses: Patient Active Problem List   Diagnosis Date Noted  . Tibia/fibula fracture 04/09/2016    Orientation RESPIRATION BLADDER Height & Weight     Self, Time, Situation, Place  Normal Continent Weight: 217 lb 1.6 oz (98.5 kg) Height:  5\' 9"  (175.3 cm)  BEHAVIORAL SYMPTOMS/MOOD NEUROLOGICAL BOWEL NUTRITION STATUS    Convulsions/Seizures (Controlled epilepsy with medication) Continent    AMBULATORY STATUS COMMUNICATION OF NEEDS Skin   Extensive Assist Verbally Bruising                       Personal Care Assistance Level of Assistance  Bathing, Dressing Bathing Assistance: Limited assistance   Dressing Assistance: Limited assistance     Functional Limitations Info             SPECIAL CARE FACTORS FREQUENCY  PT (By licensed PT)     PT Frequency: Up to 5X per day 5X per week              Contractures Contractures Info: Present    Additional Factors Info  Psychotropic, Isolation Precautions     Psychotropic Info: Luminal, Protonix, Ativan, Dilantin    Isolation Precautions Info: MRSA positive     Current Medications (04/15/2016):  This is the current hospital active medication list Current Facility-Administered Medications  Medication Dose Route Frequency Provider Last Rate Last Dose  .  acetaminophen (TYLENOL) tablet 650 mg  650 mg Oral Q6H PRN Christena FlakeJohn J Poggi, MD   650 mg at 04/12/16 [id number]   Or  . acetaminophen (TYLENOL) suppository 650 mg  650 mg Rectal Q6H PRN Christena FlakeJohn J Poggi, MD      . bisacodyl (DULCOLAX) suppository 10 mg  10 mg Rectal Daily PRN Christena FlakeJohn J Poggi, MD      . diphenhydrAMINE (BENADRYL) 12.5 MG/5ML elixir 12.5-25 mg  12.5-25 mg Oral Q4H PRN Christena FlakeJohn J Poggi, MD   25 mg at 04/15/16 1045  . docusate sodium (COLACE) capsule 100 mg  100 mg Oral BID Christena FlakeJohn J Poggi, MD   100 mg at 04/14/16 2214  . enoxaparin (LOVENOX) injection 40 mg  40 mg Subcutaneous Q24H Christena FlakeJohn J Poggi, MD   40 mg at 04/15/16 0741  . HYDROmorphone (DILAUDID) injection 1-2 mg  1-2 mg Intravenous Q2H PRN Christena FlakeJohn J Poggi, MD      . levETIRAcetam (KEPPRA) tablet 1,500 mg  1,500 mg Oral BID Nita Sicklearolina Veronese, MD   1,500 mg at 04/15/16 0915  . LORazepam (ATIVAN) injection 2 mg  2 mg Intravenous Q5 min PRN Arnaldo NatalMichael S Diamond, MD      . magnesium hydroxide (MILK OF MAGNESIA) suspension 30 mL  30 mL Oral Daily PRN Christena FlakeJohn J Poggi, MD      . metoCLOPramide (REGLAN) tablet 5-10 mg  5-10 mg Oral Q8H PRN Excell SeltzerJohn J Poggi,  MD       Or  . metoCLOPramide (REGLAN) injection 5-10 mg  5-10 mg Intravenous Q8H PRN Christena Flake, MD      . metoprolol tartrate (LOPRESSOR) tablet 25 mg  25 mg Oral BID Auburn Bilberry, MD   25 mg at 04/15/16 0915  . ondansetron (ZOFRAN) tablet 4 mg  4 mg Oral Q6H PRN Christena Flake, MD       Or  . ondansetron [facility]) injection 4 mg  4 mg Intravenous Q6H PRN Christena Flake, MD      . oxyCODONE (Oxy IR/ROXICODONE) immediate release tablet 5-10 mg  5-10 mg Oral Q3H PRN Christena Flake, MD   5 mg at 04/14/16 1106  . pantoprazole (PROTONIX) EC tablet 40 mg  40 mg Oral Daily Christena Flake, MD   40 mg at 04/15/16 0916  . PHENobarbital (LUMINAL) tablet 32.4 mg  32.4 mg Oral QHS Auburn Bilberry, MD   32.4 mg at 04/14/16 2213  . phenytoin (DILANTIN) ER capsule 300 mg  300 mg Oral QHS Christena Flake, MD   300 mg at 04/14/16 2213  .  sodium chloride flush (NS) 0.9 % injection 3 mL  3 mL Intravenous Q12H Arnaldo Natal, MD   3 mL at 04/14/16 2214  . sodium phosphate (FLEET) 7-19 GM/118ML enema 1 enema  1 enema Rectal Once PRN Christena Flake, MD         Discharge Medications: Please see discharge summary for a list of discharge medications.  Relevant Imaging Results:  Relevant Lab Results:   Additional Information SS# [id number]  Mariah Jimenez, Mariah Jimenez, [place]

## 2016-04-15 NOTE — Care Management Important Message (Signed)
Important Message  Patient Details  Name: Mariah Jimenez MRN: [id number] Date of Birth: 03/24/1946   Medicare Important Message Given:  Yes    Marily MemosLisa M Synia Douglass, RN 04/15/2016, 1:55 PM

## 2016-04-15 NOTE — Progress Notes (Signed)
Physical Therapy Treatment Patient Details Name: Mariah Jimenez MRN: [id number] DOB: 15-Aug-1945 Today's Date: 04/15/2016    History of Present Illness Patient is a 71 y.o. female admitted after mechanical fall in the bathroom, acquiring a R tibia and fibula fx. ORIF performed, and patient NWB on R. PMH includes mental retardation and seizure disorder.    PT Comments    Pt able to progress to stand pivot bed to recliner with 2 assist (transfer to L side to assist with maintaining NWB'ing R LE precautions).  Will continue to progress pt with strengthening and transfers per pt tolerance.  Follow Up Recommendations  SNF     Equipment Recommendations  Rolling walker with 5" wheels;3in1 (PT)    Recommendations for Other Services       Precautions / Restrictions Precautions Precautions: Fall Required Braces or Orthoses: Other Brace/Splint Other Brace/Splint: R ankle splint Restrictions Weight Bearing Restrictions: Yes RLE Weight Bearing: Non weight bearing    Mobility  Bed Mobility Overal bed mobility: Needs Assistance Bed Mobility: Supine to Sit     Supine to sit: Supervision;HOB elevated     General bed mobility comments: increased time to perform on own; vc's for use of side rail and technique  Transfers Overall transfer level: Needs assistance Equipment used: Rolling walker (2 wheeled);None Transfers: Sit to/from [employer] to Stand: Min assist;Mod assist;+2 physical assistance Stand pivot transfers: Max assist;+2 physical assistance       General transfer comment: x2 trials sit to/from stand from bed with max cueing for hand/feet placement and NWB'ing status; attempted to perform stand pivot with RW but pt unable to pivot on L LE to perform with multiple trials); stand pivot bed to recliner to L (to assist with maintaining R LE NWB'ing status) with 2 assist  Ambulation/Gait             General Gait Details: not appropriate at this time     Stairs            Wheelchair Mobility    Modified Rankin (Stroke Patients Only)       Balance Overall balance assessment: Needs assistance;History of Falls Sitting-balance support: Bilateral upper extremity supported;Feet unsupported Sitting balance-Leahy Scale: Good     Standing balance support: Bilateral upper extremity supported (on RW) Standing balance-Leahy Scale: Poor Standing balance comment: posterior lean initially upon standing requiring vc's to shift weight forward while still maintaining NWB'ing R LE but less posterior lean noted today compared to previous sessions                    Cognition Arousal/Alertness: Awake/alert Behavior During Therapy: WFL for tasks assessed/performed Overall Cognitive Status: No family/caregiver present to determine baseline cognitive functioning (Oriented to name)                 General Comments: Pt talkative but difficult to understand pt's speech    Exercises Total Joint Exercises Long Arc Quad: AROM;Strengthening;Left;10 reps;Seated  Vc's and tactile cues required for technique.    General Comments  Pt agreeable to PT session.      Pertinent Vitals/Pain Pain Assessment: Faces Faces Pain Scale: No hurt Pain Location: R ankle    Home Living                      Prior Function            PT Goals (current goals can now be found in the care plan section) Acute Rehab  PT Goals Patient Stated Goal: Pt did not state any goals Progress towards PT goals: Progressing toward goals    Frequency    BID      PT Plan Current plan remains appropriate    Co-evaluation             End of Session Equipment Utilized During Treatment: Gait belt Activity Tolerance: Patient limited by fatigue Patient left: in chair;with call bell/phone within reach;with chair alarm set;with nursing/sitter in room (R LE elevated via pillow; L heel elevated via towel roll)     Time: 1020-1045 PT Time  Calculation (min) (ACUTE ONLY): 25 min  Charges:  $Therapeutic Activity: 23-37 mins                    G CodesHendricks Limes:      [place] Paiz 04/15/2016, 10:58 AM Hendricks LimesEmily Aime Carreras, PT [phone]

## 2016-04-15 NOTE — Discharge Instructions (Signed)
Diet: dysphagia 3 diet   Shower:  May shower but keep the wounds dry, use an occlusive plastic wrap, NO SOAKING IN TUB.  If the bandage gets wet, change with a clean dry gauze.  Dressing:  Keep dressing clean and dry, reinforce as needed   Weight Bearing:   Non-weight bearing right lower extremity  To prevent constipation: you may use a stool softener such as -  Colace (over the counter) 100 mg by mouth twice a day  Drink plenty of fluids (prune juice may be helpful) and high fiber foods Miralax (over the counter) for constipation as needed.    Itching:  If you experience itching with your medications, try taking only a single pain pill, or even half a pain pill at a time.  You may take up to 10 pain pills per day, and you can also use benadryl over the counter for itching or also to help with sleep.   Precautions:  If you experience chest pain or shortness of breath - call 911 immediately for transfer to the hospital emergency department!!  If you develop a fever greater that 101 F, purulent drainage from wound, increased redness or drainage from wound, or calf pain-Call [facility]                                              Follow- Up Appointment:  Please call [facility]

## 2016-04-15 NOTE — Discharge Summary (Signed)
[facility] [place] [facility]  Mariah Jimenez, 71 y.o., DOB 03/24/1946, MRN [id number]. Admission date: 04/08/2016 Discharge Date 04/15/2016 [facility] Admitting Physician Arnaldo NatalMichael S Diamond, MD  Admission Diagnosis  Injury caused by twisting [T14.90XA] Closed fracture of right tibia and fibula, initial encounter [S82.201A, S82.401A] Tibia/fibula fracture, right, closed, initial encounter [S82.201A, S82.401A]  Discharge Diagnosis   Active Problems:   Tibia/fibula fracture    Seizure disorgder    Mental retardation       Hospital Course The patient with past medical history of mental retardation and seizure disorder presents to the emergency department after suffering a mechanical fall. She usually walks with the help of a walker but apparently tried to ambulate to the bathroom on her own in a dark room while her family members were sleep which resulted in a fall.  Patient was noted to have right  Tibial fracture, she underwent repair of closed displaced distal third right tib-fib fx.  Pt has been stable was awaiting approval for rehab.           Discharge instructions:   Diet: dysphagia 3 diet   Shower:  May shower but keep the wounds dry, use an occlusive plastic wrap, NO SOAKING IN TUB.  If the bandage gets wet, change with a clean dry gauze.  Dressing:  Keep dressing clean and dry, reinforce as needed   Weight Bearing:   Non-weight bearing right lower extremity  To prevent constipation: you may use a stool softener such as -     Consults  orthopedic surgery  Significant Tests:  See full reports for all details     Dg Tibia/fibula Right  Result Date: 04/09/2016 CLINICAL DATA:  Status post surgical internal fixation of the right tibial fracture. EXAM: DG C-ARM 61-120 MIN; RIGHT TIBIA AND FIBULA - 2 VIEW FLUOROSCOPY TIME:  2 minutes 28 seconds. COMPARISON:  Radiographs of same day. FINDINGS: Six  intraoperative fluoroscopic images demonstrate intramedullary rod fixation of comminuted distal right tibial fracture. Improved alignment of fracture components is noted. Continued presence of comminuted and mildly displaced distal right fibular fracture is noted. IMPRESSION: Status post intramedullary rod fixation of comminuted distal right tibial fracture. Electronically Signed   By: Lupita RaiderJames  Green Jr, M.D.   On: 04/09/2016 15:30   Dg Tibia/fibula Right  Result Date: 04/09/2016 CLINICAL DATA:  Fall today with injury to ankle in right lower leg. Pain. EXAM: RIGHT TIBIA AND FIBULA - 2 VIEW COMPARISON:  None. FINDINGS: Comminuted displaced fractures of the distal tibia and fibular diaphysis. There is 14 mm lateral displacement of distal tibial fragment and 8 mm lateral displacement of distal fibular fragment. Mild apex posterior angulation of the tibial fracture. Knee alignment is maintained. Diffuse soft tissue edema. IMPRESSION: Comminuted displaced distal tibia and fibular shaft fractures. Electronically Signed   By: Rubye OaksMelanie  Ehinger M.D.   On: 04/09/2016 00:01   Dg Ankle Complete Right  Result Date: 04/09/2016 CLINICAL DATA:  Fall today injuring right ankle and lower leg. Twisting injury. EXAM: RIGHT ANKLE - COMPLETE 3+ VIEW COMPARISON:  None. FINDINGS: Comminuted displaced fractures of the distal tibia and fibular diaphysis. There is 14 mm lateral displacement of distal tibial fragment and 8 mm lateral displacement of distal fibular fragment. Mild apex posterior angulation of the tibial fracture. No extension to the ankle joint. Ankle mortise is preserved. Diffuse soft tissue edema. IMPRESSION: Comminuted displaced distal tibia and fibular shaft fractures. No intra-articular extension. Electronically Signed  By: Rubye Oaks M.D.   On: 04/09/2016 00:00   Dg Tibia/fibula Right Port  Result Date: 04/09/2016 CLINICAL DATA:  Distal tibia fracture, postreduction. EXAM: PORTABLE RIGHT TIBIA AND  FIBULA - 2 VIEW COMPARISON:  Pre reduction radiographs 2 hours prior. FINDINGS: Grossly unchanged alignment of comminuted distal tibia and fibular shaft fractures compared to pre reduction views. Persistent angulation and displacement. Overlying cast material in place. IMPRESSION: Grossly unchanged alignment of comminuted angulated distal tibia and fibular fractures. Electronically Signed   By: Rubye Oaks M.D.   On: 04/09/2016 01:49   Dg C-arm 61-120 Min  Result Date: 04/09/2016 CLINICAL DATA:  Status post surgical internal fixation of the right tibial fracture. EXAM: DG C-ARM 61-120 MIN; RIGHT TIBIA AND FIBULA - 2 VIEW FLUOROSCOPY TIME:  2 minutes 28 seconds. COMPARISON:  Radiographs of same day. FINDINGS: Six intraoperative fluoroscopic images demonstrate intramedullary rod fixation of comminuted distal right tibial fracture. Improved alignment of fracture components is noted. Continued presence of comminuted and mildly displaced distal right fibular fracture is noted. IMPRESSION: Status post intramedullary rod fixation of comminuted distal right tibial fracture. Electronically Signed   By: Lupita Raider, M.D.   On: 04/09/2016 15:30       Today   Subjective:   Mariah Jimenez has no complaints  Objective:   Blood pressure 125/60, pulse 94, temperature 98.5 F (36.9 C), temperature source Oral, resp. rate 16, height 5\' 9"  (1.753 m), weight 217 lb 1.6 oz (98.5 kg), SpO2 100 %.  .  Intake/Output Summary (Last 24 hours) at 04/15/16 1107 Last data filed at 04/15/16 0900  Gross per 24 hour  Intake              480 ml  Output                0 ml  Net              480 ml    Exam VITAL SIGNS: Blood pressure 125/60, pulse 94, temperature 98.5 F (36.9 C), temperature source Oral, resp. rate 16, height 5\' 9"  (1.753 m), weight 217 lb 1.6 oz (98.5 kg), SpO2 100 %.  GENERAL:  71 y.o.-year-old patient lying in the bed with no acute distress.  EYES: Pupils equal, round, reactive to light  and accommodation. No scleral icterus. Extraocular muscles intact.  HEENT: Head atraumatic, normocephalic. Oropharynx and nasopharynx clear.  NECK:  Supple, no jugular venous distention. No thyroid enlargement, no tenderness.  LUNGS: Normal breath sounds bilaterally, no wheezing, rales,rhonchi or crepitation. No use of accessory muscles of respiration.  CARDIOVASCULAR: S1, S2 normal. No murmurs, rubs, or gallops.  ABDOMEN: Soft, nontender, nondistended. Bowel sounds present. No organomegaly or mass.  EXTREMITIES: right lower ext with dressing in place  NEUROLOGIC: Cranial nerves II through XII are intact. Muscle strength 5/5 in all extremities. Sensation intact. Gait not checked.  PSYCHIATRIC: The patient is alert and oriented x 3.  SKIN: No obvious rash, lesion, or ulcer.   Data Review     CBC w Diff: Lab Results  Component Value Date   WBC 7.2 04/11/2016   HGB 10.2 (L) 04/11/2016   HCT 29.9 (L) 04/11/2016   PLT 166 04/11/2016   LYMPHOPCT 34 04/10/2016   MONOPCT 8 04/10/2016   EOSPCT 4 04/10/2016   BASOPCT 1 04/10/2016   CMP: Lab Results  Component Value Date   NA 139 04/12/2016   K 4.0 04/12/2016   CL 107 04/12/2016   CO2 27 04/12/2016  BUN 15 04/12/2016   CREATININE 0.52 04/15/2016   PROT 8.0 04/09/2016   ALBUMIN 3.3 (L) 04/09/2016   BILITOT 0.5 04/09/2016   ALKPHOS 216 (H) 04/09/2016   AST 26 04/09/2016   ALT 18 04/09/2016  .  Micro Results Recent Results (from the past 240 hour(s))  Surgical pcr screen     Status: Abnormal   Collection Time: 04/09/16  3:55 AM  Result Value Ref Range Status   MRSA, PCR POSITIVE (A) NEGATIVE Final    Comment: CRITICAL RESULT CALLED TO, READ BACK BY AND VERIFIED WITH: TRACY TOOMBS AT 0519 ON 04/09/16 [facility].    Staphylococcus aureus POSITIVE (A) NEGATIVE Final    Comment:        The Xpert SA Assay (FDA approved for NASAL specimens in patients over 16 years of age), is one component of a comprehensive surveillance program.   Test performance has been validated by [facility] for patients greater than or equal to 15 year old. It is not intended to diagnose infection nor to guide or monitor treatment.         Code Status Orders        Start     Ordered   04/09/16 0311  Full code  Continuous     04/09/16 0310    Code Status History    Date Active Date Inactive Code Status Order ID Comments User Context   This patient has a current code status but no historical code status.           Contact information for follow-up providers    Christena Flake, MD Follow up on 03/30/2016.   Specialty:  Surgery Why:  at 0830 Contact information: [address] [facility] [place] [address] [phone]            Contact information for after-discharge care    Destination    [facility] Follow up.   Specialty:  Skilled Nursing Facility Contact information: [address] [place] [address] [phone]                  Discharge Medications   Allergies as of 04/15/2016   No Known Allergies     Medication List    TAKE these medications   docusate sodium 100 MG capsule Commonly known as:  COLACE Take 1 capsule (100 mg total) by mouth 2 (two) times daily.   enoxaparin 40 MG/0.4ML injection Commonly known as:  LOVENOX Inject 0.4 mLs (40 mg total) into the skin daily.   levETIRAcetam 500 MG tablet Commonly known as:  KEPPRA Take 1,500 mg by mouth 2 (two) times daily.   oxyCODONE 5 MG immediate release tablet Commonly known as:  Oxy IR/ROXICODONE Take 1 tablet (5 mg total) by mouth every 4 (four) hours as needed for breakthrough pain.   PHENObarbital 30 MG tablet Commonly known as:  LUMINAL Take 30 mg by mouth at bedtime.   phenytoin 100 MG ER capsule Commonly known as:  DILANTIN Take 300 mg by mouth at bedtime.          Total Time in preparing paper work, data evaluation and todays exam - 35 minutes  Auburn Bilberry M.D on 04/15/2016 at 11:07 AM  [facility]   Office  [phone]

## 2016-04-15 NOTE — Progress Notes (Signed)
Shift assessment completed at 0730. Pt is easily awakened at that time, cooperative, denied pain. Pt is on room air, lungs clear bilat, HR is regular, abdomen is soft, bs heard. Pt is wearing incontinence brief. R leg has splint and ace wrap intact, cap refill to toes is less than 3 seconds.L ppp. piv #22 intact to r hand, site is free of redness and swelling. Pt was assissted with breakfast and changed for incontinence of urine and stool at 0915. Physical therapy has just worked with pt and pt is now oob to chair with legs elevated, received benadryl po for c/o itching. Pt has call bell in reach.

## 2016-04-15 NOTE — Progress Notes (Signed)
This Clinical research associatewriter removed piv with catheter intact at 1500, then this Clinical research associatewriter called report to [facility] and spoke to [facility]ennifer,RN pt was dc'd via EMS [id number], pt in no distress, VS stable.

## 2016-04-15 NOTE — Progress Notes (Signed)
Patient is medically stable for D/C to [facility] today. [facility] has been received. [facility] room [facility]. RN will call report and arrange EMS for transport. Clinical Child psychotherapistocial Worker (CSW) sent [facility]C [facility] [employer]. Patient is aware of above. CSW contacted patient's sister Ronal FearMary Ramires and made her aware of above. Please reconsult if future social work needs arise. CSW signing off.   [employer] Hughes IncorporatedBailey Ludy Messamore, LCSW [phone]

## 2016-04-16 DIAGNOSIS — R262 Difficulty in walking, not elsewhere classified: Secondary | ICD-10-CM | POA: Diagnosis not present

## 2016-04-16 DIAGNOSIS — S82401D Unspecified fracture of shaft of right fibula, subsequent encounter for closed fracture with routine healing: Secondary | ICD-10-CM | POA: Diagnosis not present

## 2016-04-16 DIAGNOSIS — Z9181 History of falling: Secondary | ICD-10-CM | POA: Diagnosis not present

## 2016-04-16 DIAGNOSIS — S82201D Unspecified fracture of shaft of right tibia, subsequent encounter for closed fracture with routine healing: Secondary | ICD-10-CM | POA: Diagnosis not present

## 2016-04-16 DIAGNOSIS — M6281 Muscle weakness (generalized): Secondary | ICD-10-CM | POA: Diagnosis not present

## 2016-04-17 DIAGNOSIS — Z4789 Encounter for other orthopedic aftercare: Secondary | ICD-10-CM | POA: Diagnosis not present

## 2016-04-17 DIAGNOSIS — F79 Unspecified intellectual disabilities: Secondary | ICD-10-CM | POA: Diagnosis not present

## 2016-04-17 DIAGNOSIS — M6281 Muscle weakness (generalized): Secondary | ICD-10-CM | POA: Diagnosis not present

## 2016-04-17 DIAGNOSIS — Z9181 History of falling: Secondary | ICD-10-CM | POA: Diagnosis not present

## 2016-04-17 DIAGNOSIS — R262 Difficulty in walking, not elsewhere classified: Secondary | ICD-10-CM | POA: Diagnosis not present

## 2016-04-17 DIAGNOSIS — S82401D Unspecified fracture of shaft of right fibula, subsequent encounter for closed fracture with routine healing: Secondary | ICD-10-CM | POA: Diagnosis not present

## 2016-04-17 DIAGNOSIS — S82201D Unspecified fracture of shaft of right tibia, subsequent encounter for closed fracture with routine healing: Secondary | ICD-10-CM | POA: Diagnosis not present

## 2016-04-19 DIAGNOSIS — S82401D Unspecified fracture of shaft of right fibula, subsequent encounter for closed fracture with routine healing: Secondary | ICD-10-CM | POA: Diagnosis not present

## 2016-04-19 DIAGNOSIS — S82201D Unspecified fracture of shaft of right tibia, subsequent encounter for closed fracture with routine healing: Secondary | ICD-10-CM | POA: Diagnosis not present

## 2016-04-19 DIAGNOSIS — Z9181 History of falling: Secondary | ICD-10-CM | POA: Diagnosis not present

## 2016-04-19 DIAGNOSIS — R262 Difficulty in walking, not elsewhere classified: Secondary | ICD-10-CM | POA: Diagnosis not present

## 2016-04-19 DIAGNOSIS — M6281 Muscle weakness (generalized): Secondary | ICD-10-CM | POA: Diagnosis not present

## 2016-04-20 DIAGNOSIS — M6281 Muscle weakness (generalized): Secondary | ICD-10-CM | POA: Diagnosis not present

## 2016-04-20 DIAGNOSIS — R262 Difficulty in walking, not elsewhere classified: Secondary | ICD-10-CM | POA: Diagnosis not present

## 2016-04-20 DIAGNOSIS — S82401D Unspecified fracture of shaft of right fibula, subsequent encounter for closed fracture with routine healing: Secondary | ICD-10-CM | POA: Diagnosis not present

## 2016-04-20 DIAGNOSIS — Z9181 History of falling: Secondary | ICD-10-CM | POA: Diagnosis not present

## 2016-04-20 DIAGNOSIS — S82201D Unspecified fracture of shaft of right tibia, subsequent encounter for closed fracture with routine healing: Secondary | ICD-10-CM | POA: Diagnosis not present

## 2016-04-21 DIAGNOSIS — S82201D Unspecified fracture of shaft of right tibia, subsequent encounter for closed fracture with routine healing: Secondary | ICD-10-CM | POA: Diagnosis not present

## 2016-04-21 DIAGNOSIS — Z9181 History of falling: Secondary | ICD-10-CM | POA: Diagnosis not present

## 2016-04-21 DIAGNOSIS — R262 Difficulty in walking, not elsewhere classified: Secondary | ICD-10-CM | POA: Diagnosis not present

## 2016-04-21 DIAGNOSIS — S82401D Unspecified fracture of shaft of right fibula, subsequent encounter for closed fracture with routine healing: Secondary | ICD-10-CM | POA: Diagnosis not present

## 2016-04-21 DIAGNOSIS — M6281 Muscle weakness (generalized): Secondary | ICD-10-CM | POA: Diagnosis not present

## 2016-04-22 DIAGNOSIS — R262 Difficulty in walking, not elsewhere classified: Secondary | ICD-10-CM | POA: Diagnosis not present

## 2016-04-22 DIAGNOSIS — Z9181 History of falling: Secondary | ICD-10-CM | POA: Diagnosis not present

## 2016-04-22 DIAGNOSIS — S82401D Unspecified fracture of shaft of right fibula, subsequent encounter for closed fracture with routine healing: Secondary | ICD-10-CM | POA: Diagnosis not present

## 2016-04-22 DIAGNOSIS — M6281 Muscle weakness (generalized): Secondary | ICD-10-CM | POA: Diagnosis not present

## 2016-04-22 DIAGNOSIS — S82201D Unspecified fracture of shaft of right tibia, subsequent encounter for closed fracture with routine healing: Secondary | ICD-10-CM | POA: Diagnosis not present

## 2016-04-22 DIAGNOSIS — D649 Anemia, unspecified: Secondary | ICD-10-CM | POA: Diagnosis not present

## 2016-04-23 DIAGNOSIS — S82401D Unspecified fracture of shaft of right fibula, subsequent encounter for closed fracture with routine healing: Secondary | ICD-10-CM | POA: Diagnosis not present

## 2016-04-23 DIAGNOSIS — M6281 Muscle weakness (generalized): Secondary | ICD-10-CM | POA: Diagnosis not present

## 2016-04-23 DIAGNOSIS — R262 Difficulty in walking, not elsewhere classified: Secondary | ICD-10-CM | POA: Diagnosis not present

## 2016-04-23 DIAGNOSIS — Z9181 History of falling: Secondary | ICD-10-CM | POA: Diagnosis not present

## 2016-04-23 DIAGNOSIS — S82201D Unspecified fracture of shaft of right tibia, subsequent encounter for closed fracture with routine healing: Secondary | ICD-10-CM | POA: Diagnosis not present

## 2016-04-24 DIAGNOSIS — S82201D Unspecified fracture of shaft of right tibia, subsequent encounter for closed fracture with routine healing: Secondary | ICD-10-CM | POA: Diagnosis not present

## 2016-04-24 DIAGNOSIS — Z9181 History of falling: Secondary | ICD-10-CM | POA: Diagnosis not present

## 2016-04-24 DIAGNOSIS — M6281 Muscle weakness (generalized): Secondary | ICD-10-CM | POA: Diagnosis not present

## 2016-04-24 DIAGNOSIS — S82401D Unspecified fracture of shaft of right fibula, subsequent encounter for closed fracture with routine healing: Secondary | ICD-10-CM | POA: Diagnosis not present

## 2016-04-24 DIAGNOSIS — R262 Difficulty in walking, not elsewhere classified: Secondary | ICD-10-CM | POA: Diagnosis not present

## 2016-04-25 ENCOUNTER — Emergency Department
Admission: EM | Admit: 2016-04-25 | Discharge: 2016-04-26 | Disposition: A | Discharge status: Home / Self Care | Payer: Medicare Other | Attending: Emergency Medicine | Admitting: Emergency Medicine

## 2016-04-25 ENCOUNTER — Encounter: Payer: Self-pay | Admitting: Emergency Medicine

## 2016-04-25 DIAGNOSIS — F70 Mild intellectual disabilities: Secondary | ICD-10-CM | POA: Insufficient documentation

## 2016-04-25 DIAGNOSIS — G40909 Epilepsy, unspecified, not intractable, without status epilepticus: Secondary | ICD-10-CM | POA: Diagnosis not present

## 2016-04-25 DIAGNOSIS — R569 Unspecified convulsions: Secondary | ICD-10-CM | POA: Diagnosis not present

## 2016-04-25 DIAGNOSIS — Z9181 History of falling: Secondary | ICD-10-CM | POA: Diagnosis not present

## 2016-04-25 DIAGNOSIS — N390 Urinary tract infection, site not specified: Secondary | ICD-10-CM | POA: Diagnosis not present

## 2016-04-25 DIAGNOSIS — Z79899 Other long term (current) drug therapy: Secondary | ICD-10-CM | POA: Diagnosis not present

## 2016-04-25 DIAGNOSIS — R262 Difficulty in walking, not elsewhere classified: Secondary | ICD-10-CM | POA: Diagnosis not present

## 2016-04-25 DIAGNOSIS — S82401D Unspecified fracture of shaft of right fibula, subsequent encounter for closed fracture with routine healing: Secondary | ICD-10-CM | POA: Diagnosis not present

## 2016-04-25 DIAGNOSIS — Z7401 Bed confinement status: Secondary | ICD-10-CM | POA: Diagnosis not present

## 2016-04-25 DIAGNOSIS — M6281 Muscle weakness (generalized): Secondary | ICD-10-CM | POA: Diagnosis not present

## 2016-04-25 DIAGNOSIS — S82201D Unspecified fracture of shaft of right tibia, subsequent encounter for closed fracture with routine healing: Secondary | ICD-10-CM | POA: Diagnosis not present

## 2016-04-25 LAB — CBC WITH DIFFERENTIAL/PLATELET
BASOS ABS: 0 10*3/uL (ref 0–0.1)
Basophils Relative: 1 %
EOS PCT: 2 %
Eosinophils Absolute: 0.1 10*3/uL (ref 0–0.7)
HCT: 29.6 % — ABNORMAL LOW (ref 35.0–47.0)
Hemoglobin: 10.5 g/dL — ABNORMAL LOW (ref 12.0–16.0)
LYMPHS PCT: 18 %
Lymphs Abs: 1 10*3/uL (ref 1.0–3.6)
MCH: 31.2 pg (ref 26.0–34.0)
MCHC: 35.3 g/dL (ref 32.0–36.0)
MCV: 88.3 fL (ref 80.0–100.0)
MONO ABS: 0.3 10*3/uL (ref 0.2–0.9)
Monocytes Relative: 6 %
Neutro Abs: 4.2 10*3/uL (ref 1.4–6.5)
Neutrophils Relative %: 73 %
PLATELETS: 367 10*3/uL (ref 150–440)
RBC: 3.36 MIL/uL — ABNORMAL LOW (ref 3.80–5.20)
RDW: 14.5 % (ref 11.5–14.5)
WBC: 5.7 10*3/uL (ref 3.6–11.0)

## 2016-04-25 LAB — URINALYSIS, ROUTINE W REFLEX MICROSCOPIC
Bilirubin Urine: NEGATIVE
Glucose, UA: NEGATIVE mg/dL
Ketones, ur: NEGATIVE mg/dL
Nitrite: POSITIVE — AB
Protein, ur: NEGATIVE mg/dL
SPECIFIC GRAVITY, URINE: 1.009 (ref 1.005–1.030)
pH: 6 (ref 5.0–8.0)

## 2016-04-25 LAB — BASIC METABOLIC PANEL
ANION GAP: 8 (ref 5–15)
BUN: 14 mg/dL (ref 6–20)
CALCIUM: 8.9 mg/dL (ref 8.9–10.3)
CO2: 23 mmol/L (ref 22–32)
CREATININE: 0.82 mg/dL (ref 0.44–1.00)
Chloride: 105 mmol/L (ref 101–111)
GFR calc Af Amer: >60 mL/min (ref >60)
GLUCOSE: 126 mg/dL — AB (ref 65–99)
Potassium: 4.1 mmol/L (ref 3.5–5.1)
Sodium: 136 mmol/L (ref 135–145)

## 2016-04-25 LAB — PHENYTOIN LEVEL, TOTAL: Phenytoin Lvl: 3.4 ug/mL — ABNORMAL LOW (ref 10.0–20.0)

## 2016-04-25 MED ORDER — CEPHALEXIN 500 MG PO CAPS
500.0000 mg | ORAL_CAPSULE | Freq: Once | ORAL | Status: AC
  Administered 2016-04-25: 500 mg via ORAL
  Filled 2016-04-25: qty 1

## 2016-04-25 MED ORDER — CEPHALEXIN 500 MG PO CAPS: 500.0000 mg | ORAL_CAPSULE | Freq: Three times a day (TID) | ORAL | 0 refills | Status: DC

## 2016-04-25 NOTE — ED Triage Notes (Signed)
Pt arrives via [facility] with c/o seizures. Pt is at baseline for confusion. Pt has hx of seizure and per EMS was not post ictal and was alert at the time of their arrival. Per EMS, pt family witnessed seizure earlier in the day but facility did not send her out at that time. EMS reports that facility believes that she may have a UTI but pt is urinating as usual at this time. Pt VS with EMS were BP 154/80, PR 120, and CBG 155. Pt is in NAD at this time and is alert and oriented at baseline.

## 2016-04-25 NOTE — ED Notes (Signed)
Pt's niece states that nursing home was stating she had decreased urinary output and nice states that it was malodorous.

## 2016-04-25 NOTE — ED Provider Notes (Signed)
[facility] Emergency Department Provider Note  Time seen: 8:53 PM  I have reviewed the triage vital signs and the nursing notes.   HISTORY  Chief Complaint Urinary Tract Infection    HPI Mariah Jimenez is a 71 y.o. female with a past medical history mental retardation, seizure disorder, who presents to the emergency department after a seizure earlier this morning. According to EMS reported they state the patient had a seizure earlier today. According to EMS the family suspected a possible urinary tract infection to be the cause of her seizure so they wished for her to come to the ER to get evaluated. Patient has a history of seizures.  Past Medical History:  Diagnosis Date  . Mental retardation   . Seizures [facility]     Patient Active Problem List   Diagnosis Date Noted  . Tibia/fibula fracture 04/09/2016    Past Surgical History:  Procedure Laterality Date  . NO PAST SURGERIES    . TIBIA IM NAIL INSERTION Right 04/09/2016   Procedure: INTRAMEDULLARY (IM) NAIL TIBIAL;  Surgeon: Christena Flake, MD;  Location: [facility] ORS;  Service: Orthopedics;  Laterality: Right;    Prior to Admission medications   Medication Sig Start Date End Date Taking? Authorizing Provider  docusate sodium (COLACE) 100 MG capsule Take 1 capsule (100 mg total) by mouth 2 (two) times daily. 04/15/16   Auburn Bilberry, MD  enoxaparin (LOVENOX) 40 MG/0.4ML injection Inject 0.4 mLs (40 mg total) into the skin daily. 04/13/16 04/27/16  Evon Slack, PA-C  levETIRAcetam (KEPPRA) 500 MG tablet Take 1,500 mg by mouth 2 (two) times daily.    Historical Provider, MD  oxyCODONE (OXY IR/ROXICODONE) 5 MG immediate release tablet Take 1 tablet (5 mg total) by mouth every 4 (four) hours as needed for breakthrough pain. 04/12/16   Evon Slack, PA-C  PHENObarbital (LUMINAL) 30 MG tablet Take 30 mg by mouth at bedtime.    Historical Provider, MD  phenytoin (DILANTIN) 100 MG ER capsule Take 300 mg by mouth at  bedtime.    Historical Provider, MD    No Known Allergies  Family History  Problem Relation Age of Onset  . Family history unknown: Yes    Social History Social History  Substance Use Topics  . Smoking status: Never Smoker  . Smokeless tobacco: Never Used  . Alcohol use No    Review of Systems Unable to obtain an accurate review of systems due to significant mental retardation.  ____________________________________________   PHYSICAL EXAM:  VITAL SIGNS: ED Triage Vitals  Enc Vitals Group     BP 04/25/16 2041 (!) 151/68     Pulse Rate 04/25/16 2041 (!) 115     Resp 04/25/16 2041 18     Temp 04/25/16 2041 98.6 F (37 C)     Temp Source 04/25/16 2041 Oral     SpO2 04/25/16 2041 98 %     Weight 04/25/16 2040 217 lb (98.4 kg)     Height 04/25/16 2040 5\' 9"  (1.753 m)     Head Circumference --      Peak Flow --      Pain Score --      Pain Loc --      Pain Edu? --      Excl. in GC? --     Constitutional: Alert. No distress. Well-appearing. Patient does not answer questions or follow commands which is likely baseline. Eyes: Normal exam ENT   Head: Normocephalic and atraumatic  Mouth/Throat: Mucous membranes are moist. Protruding lower lip well documented in the past. Cardiovascular: Normal rate, regular rhythm. No murmur Respiratory: Normal respiratory effort without tachypnea nor retractions. Breath sounds are clear  Gastrointestinal: Soft and nontender. No distention. Musculoskeletal: Nontender with normal range of motion in all extremities.  Neurologic:  No gross focal neurologic deficits  Skin:  Skin is warm, dry and intact.  Psychiatric: Mood and affect are normal.   ____________________________________________   INITIAL IMPRESSION / ASSESSMENT AND PLAN / ED COURSE  Pertinent labs & imaging results that were available during my care of the patient were reviewed by me and considered in my medical decision making (see chart for details).  Patient  presents the emergency department after a seizure earlier today. We will check basic labs including urinalysis and closely monitor. I attempted calling [place] [facility] with no response. We will continue to attempt to contact them for further details. Overall the patient appears well, no distress.  Patient is urinalysis consistent with urinary tract infection. Family members here with the patient states that she typically will have a seizure with urinary tract infection. We will dose Keflex and emergency department discharge for the same. She'll follow-up with her primary doctor.  ____________________________________________   FINAL CLINICAL IMPRESSION(S) / ED DIAGNOSES  Seizure Urinary tract infection   Minna AntisKevin Madilyne Tadlock, MD 04/25/16 2220

## 2016-04-26 DIAGNOSIS — M6281 Muscle weakness (generalized): Secondary | ICD-10-CM | POA: Diagnosis not present

## 2016-04-26 DIAGNOSIS — Z9181 History of falling: Secondary | ICD-10-CM | POA: Diagnosis not present

## 2016-04-26 DIAGNOSIS — R262 Difficulty in walking, not elsewhere classified: Secondary | ICD-10-CM | POA: Diagnosis not present

## 2016-04-26 DIAGNOSIS — S82201D Unspecified fracture of shaft of right tibia, subsequent encounter for closed fracture with routine healing: Secondary | ICD-10-CM | POA: Diagnosis not present

## 2016-04-26 DIAGNOSIS — Z5181 Encounter for therapeutic drug level monitoring: Secondary | ICD-10-CM | POA: Diagnosis not present

## 2016-04-26 DIAGNOSIS — S82401D Unspecified fracture of shaft of right fibula, subsequent encounter for closed fracture with routine healing: Secondary | ICD-10-CM | POA: Diagnosis not present

## 2016-04-26 DIAGNOSIS — R4182 Altered mental status, unspecified: Secondary | ICD-10-CM | POA: Diagnosis not present

## 2016-04-27 DIAGNOSIS — M6281 Muscle weakness (generalized): Secondary | ICD-10-CM | POA: Diagnosis not present

## 2016-04-27 DIAGNOSIS — Z9181 History of falling: Secondary | ICD-10-CM | POA: Diagnosis not present

## 2016-04-27 DIAGNOSIS — R262 Difficulty in walking, not elsewhere classified: Secondary | ICD-10-CM | POA: Diagnosis not present

## 2016-04-27 DIAGNOSIS — S82401D Unspecified fracture of shaft of right fibula, subsequent encounter for closed fracture with routine healing: Secondary | ICD-10-CM | POA: Diagnosis not present

## 2016-04-27 DIAGNOSIS — S82201D Unspecified fracture of shaft of right tibia, subsequent encounter for closed fracture with routine healing: Secondary | ICD-10-CM | POA: Diagnosis not present

## 2016-04-28 DIAGNOSIS — S82401D Unspecified fracture of shaft of right fibula, subsequent encounter for closed fracture with routine healing: Secondary | ICD-10-CM | POA: Diagnosis not present

## 2016-04-28 DIAGNOSIS — M6281 Muscle weakness (generalized): Secondary | ICD-10-CM | POA: Diagnosis not present

## 2016-04-28 DIAGNOSIS — S82201D Unspecified fracture of shaft of right tibia, subsequent encounter for closed fracture with routine healing: Secondary | ICD-10-CM | POA: Diagnosis not present

## 2016-04-28 DIAGNOSIS — R262 Difficulty in walking, not elsewhere classified: Secondary | ICD-10-CM | POA: Diagnosis not present

## 2016-04-28 DIAGNOSIS — Z9181 History of falling: Secondary | ICD-10-CM | POA: Diagnosis not present

## 2016-04-29 DIAGNOSIS — S82401D Unspecified fracture of shaft of right fibula, subsequent encounter for closed fracture with routine healing: Secondary | ICD-10-CM | POA: Diagnosis not present

## 2016-04-29 DIAGNOSIS — M6281 Muscle weakness (generalized): Secondary | ICD-10-CM | POA: Diagnosis not present

## 2016-04-29 DIAGNOSIS — Z9181 History of falling: Secondary | ICD-10-CM | POA: Diagnosis not present

## 2016-04-29 DIAGNOSIS — R262 Difficulty in walking, not elsewhere classified: Secondary | ICD-10-CM | POA: Diagnosis not present

## 2016-04-29 DIAGNOSIS — S82201D Unspecified fracture of shaft of right tibia, subsequent encounter for closed fracture with routine healing: Secondary | ICD-10-CM | POA: Diagnosis not present

## 2016-04-30 DIAGNOSIS — Z9181 History of falling: Secondary | ICD-10-CM | POA: Diagnosis not present

## 2016-04-30 DIAGNOSIS — M6281 Muscle weakness (generalized): Secondary | ICD-10-CM | POA: Diagnosis not present

## 2016-04-30 DIAGNOSIS — R262 Difficulty in walking, not elsewhere classified: Secondary | ICD-10-CM | POA: Diagnosis not present

## 2016-04-30 DIAGNOSIS — S82201D Unspecified fracture of shaft of right tibia, subsequent encounter for closed fracture with routine healing: Secondary | ICD-10-CM | POA: Diagnosis not present

## 2016-04-30 DIAGNOSIS — S82401D Unspecified fracture of shaft of right fibula, subsequent encounter for closed fracture with routine healing: Secondary | ICD-10-CM | POA: Diagnosis not present

## 2016-05-01 DIAGNOSIS — Z9181 History of falling: Secondary | ICD-10-CM | POA: Diagnosis not present

## 2016-05-01 DIAGNOSIS — M6281 Muscle weakness (generalized): Secondary | ICD-10-CM | POA: Diagnosis not present

## 2016-05-01 DIAGNOSIS — S82201D Unspecified fracture of shaft of right tibia, subsequent encounter for closed fracture with routine healing: Secondary | ICD-10-CM | POA: Diagnosis not present

## 2016-05-01 DIAGNOSIS — R262 Difficulty in walking, not elsewhere classified: Secondary | ICD-10-CM | POA: Diagnosis not present

## 2016-05-01 DIAGNOSIS — S82401D Unspecified fracture of shaft of right fibula, subsequent encounter for closed fracture with routine healing: Secondary | ICD-10-CM | POA: Diagnosis not present

## 2016-05-02 DIAGNOSIS — S82401D Unspecified fracture of shaft of right fibula, subsequent encounter for closed fracture with routine healing: Secondary | ICD-10-CM | POA: Diagnosis not present

## 2016-05-02 DIAGNOSIS — R262 Difficulty in walking, not elsewhere classified: Secondary | ICD-10-CM | POA: Diagnosis not present

## 2016-05-02 DIAGNOSIS — Z9181 History of falling: Secondary | ICD-10-CM | POA: Diagnosis not present

## 2016-05-02 DIAGNOSIS — M6281 Muscle weakness (generalized): Secondary | ICD-10-CM | POA: Diagnosis not present

## 2016-05-02 DIAGNOSIS — S82201D Unspecified fracture of shaft of right tibia, subsequent encounter for closed fracture with routine healing: Secondary | ICD-10-CM | POA: Diagnosis not present

## 2016-05-03 DIAGNOSIS — S82401A Unspecified fracture of shaft of right fibula, initial encounter for closed fracture: Secondary | ICD-10-CM | POA: Diagnosis not present

## 2016-05-03 DIAGNOSIS — Z9181 History of falling: Secondary | ICD-10-CM | POA: Diagnosis not present

## 2016-05-03 DIAGNOSIS — S82401D Unspecified fracture of shaft of right fibula, subsequent encounter for closed fracture with routine healing: Secondary | ICD-10-CM | POA: Diagnosis not present

## 2016-05-03 DIAGNOSIS — R262 Difficulty in walking, not elsewhere classified: Secondary | ICD-10-CM | POA: Diagnosis not present

## 2016-05-03 DIAGNOSIS — S82201D Unspecified fracture of shaft of right tibia, subsequent encounter for closed fracture with routine healing: Secondary | ICD-10-CM | POA: Diagnosis not present

## 2016-05-03 DIAGNOSIS — M6281 Muscle weakness (generalized): Secondary | ICD-10-CM | POA: Diagnosis not present

## 2016-05-03 DIAGNOSIS — S82201A Unspecified fracture of shaft of right tibia, initial encounter for closed fracture: Secondary | ICD-10-CM | POA: Diagnosis not present

## 2016-05-04 DIAGNOSIS — S82201D Unspecified fracture of shaft of right tibia, subsequent encounter for closed fracture with routine healing: Secondary | ICD-10-CM | POA: Diagnosis not present

## 2016-05-04 DIAGNOSIS — Z9181 History of falling: Secondary | ICD-10-CM | POA: Diagnosis not present

## 2016-05-04 DIAGNOSIS — S82401D Unspecified fracture of shaft of right fibula, subsequent encounter for closed fracture with routine healing: Secondary | ICD-10-CM | POA: Diagnosis not present

## 2016-05-04 DIAGNOSIS — M6281 Muscle weakness (generalized): Secondary | ICD-10-CM | POA: Diagnosis not present

## 2016-05-04 DIAGNOSIS — R262 Difficulty in walking, not elsewhere classified: Secondary | ICD-10-CM | POA: Diagnosis not present

## 2016-05-04 NOTE — Progress Notes (Signed)
   04/10/16 1200  PT Time Calculation  PT Start Time (ACUTE ONLY) 1056  PT Stop Time (ACUTE ONLY) 1113  PT Time Calculation (min) (ACUTE ONLY) 17 min  PT G-Codes **NOT FOR INPATIENT CLASS**  Functional Assessment Tool Used clinical judgement  Functional Limitation Mobility: Walking and moving around  Mobility: Walking and Moving Around Current Status (W0981(G8978) CL  Mobility: Walking and Moving Around Goal Status (X9147(G8979) CI  PT General Charges  $$ ACUTE PT VISIT 1 Procedure  PT Evaluation  $PT Eval Low Complexity 1 Procedure    Late entry g-codes added upon review of initial evaluation completed by Kriste BasqueJulie Banta, PT.l  Tommy RainwaterKristen H. Manson PasseyBrown, PT, DPT, NCS 05/04/16, 10:43 AM [phone]

## 2016-05-05 DIAGNOSIS — R262 Difficulty in walking, not elsewhere classified: Secondary | ICD-10-CM | POA: Diagnosis not present

## 2016-05-05 DIAGNOSIS — S82401D Unspecified fracture of shaft of right fibula, subsequent encounter for closed fracture with routine healing: Secondary | ICD-10-CM | POA: Diagnosis not present

## 2016-05-05 DIAGNOSIS — S82201D Unspecified fracture of shaft of right tibia, subsequent encounter for closed fracture with routine healing: Secondary | ICD-10-CM | POA: Diagnosis not present

## 2016-05-05 DIAGNOSIS — Z9181 History of falling: Secondary | ICD-10-CM | POA: Diagnosis not present

## 2016-05-05 DIAGNOSIS — M6281 Muscle weakness (generalized): Secondary | ICD-10-CM | POA: Diagnosis not present

## 2016-05-06 DIAGNOSIS — M6281 Muscle weakness (generalized): Secondary | ICD-10-CM | POA: Diagnosis not present

## 2016-05-06 DIAGNOSIS — S82201D Unspecified fracture of shaft of right tibia, subsequent encounter for closed fracture with routine healing: Secondary | ICD-10-CM | POA: Diagnosis not present

## 2016-05-06 DIAGNOSIS — Z9181 History of falling: Secondary | ICD-10-CM | POA: Diagnosis not present

## 2016-05-06 DIAGNOSIS — S82401D Unspecified fracture of shaft of right fibula, subsequent encounter for closed fracture with routine healing: Secondary | ICD-10-CM | POA: Diagnosis not present

## 2016-05-06 DIAGNOSIS — R262 Difficulty in walking, not elsewhere classified: Secondary | ICD-10-CM | POA: Diagnosis not present

## 2016-05-07 DIAGNOSIS — M6281 Muscle weakness (generalized): Secondary | ICD-10-CM | POA: Diagnosis not present

## 2016-05-07 DIAGNOSIS — S82401D Unspecified fracture of shaft of right fibula, subsequent encounter for closed fracture with routine healing: Secondary | ICD-10-CM | POA: Diagnosis not present

## 2016-05-07 DIAGNOSIS — S82201D Unspecified fracture of shaft of right tibia, subsequent encounter for closed fracture with routine healing: Secondary | ICD-10-CM | POA: Diagnosis not present

## 2016-05-07 DIAGNOSIS — R262 Difficulty in walking, not elsewhere classified: Secondary | ICD-10-CM | POA: Diagnosis not present

## 2016-05-07 DIAGNOSIS — Z9181 History of falling: Secondary | ICD-10-CM | POA: Diagnosis not present

## 2016-05-08 DIAGNOSIS — Z9181 History of falling: Secondary | ICD-10-CM | POA: Diagnosis not present

## 2016-05-08 DIAGNOSIS — S82401D Unspecified fracture of shaft of right fibula, subsequent encounter for closed fracture with routine healing: Secondary | ICD-10-CM | POA: Diagnosis not present

## 2016-05-08 DIAGNOSIS — S82201D Unspecified fracture of shaft of right tibia, subsequent encounter for closed fracture with routine healing: Secondary | ICD-10-CM | POA: Diagnosis not present

## 2016-05-08 DIAGNOSIS — R262 Difficulty in walking, not elsewhere classified: Secondary | ICD-10-CM | POA: Diagnosis not present

## 2016-05-08 DIAGNOSIS — M6281 Muscle weakness (generalized): Secondary | ICD-10-CM | POA: Diagnosis not present

## 2016-05-09 DIAGNOSIS — Z9181 History of falling: Secondary | ICD-10-CM | POA: Diagnosis not present

## 2016-05-09 DIAGNOSIS — S82401D Unspecified fracture of shaft of right fibula, subsequent encounter for closed fracture with routine healing: Secondary | ICD-10-CM | POA: Diagnosis not present

## 2016-05-09 DIAGNOSIS — S82201D Unspecified fracture of shaft of right tibia, subsequent encounter for closed fracture with routine healing: Secondary | ICD-10-CM | POA: Diagnosis not present

## 2016-05-09 DIAGNOSIS — M6281 Muscle weakness (generalized): Secondary | ICD-10-CM | POA: Diagnosis not present

## 2016-05-09 DIAGNOSIS — R262 Difficulty in walking, not elsewhere classified: Secondary | ICD-10-CM | POA: Diagnosis not present

## 2016-05-11 DIAGNOSIS — R262 Difficulty in walking, not elsewhere classified: Secondary | ICD-10-CM | POA: Diagnosis not present

## 2016-05-11 DIAGNOSIS — S82401D Unspecified fracture of shaft of right fibula, subsequent encounter for closed fracture with routine healing: Secondary | ICD-10-CM | POA: Diagnosis not present

## 2016-05-11 DIAGNOSIS — S82201D Unspecified fracture of shaft of right tibia, subsequent encounter for closed fracture with routine healing: Secondary | ICD-10-CM | POA: Diagnosis not present

## 2016-05-11 DIAGNOSIS — Z9181 History of falling: Secondary | ICD-10-CM | POA: Diagnosis not present

## 2016-05-11 DIAGNOSIS — M6281 Muscle weakness (generalized): Secondary | ICD-10-CM | POA: Diagnosis not present

## 2016-05-12 DIAGNOSIS — M6281 Muscle weakness (generalized): Secondary | ICD-10-CM | POA: Diagnosis not present

## 2016-05-12 DIAGNOSIS — S82201D Unspecified fracture of shaft of right tibia, subsequent encounter for closed fracture with routine healing: Secondary | ICD-10-CM | POA: Diagnosis not present

## 2016-05-12 DIAGNOSIS — R262 Difficulty in walking, not elsewhere classified: Secondary | ICD-10-CM | POA: Diagnosis not present

## 2016-05-12 DIAGNOSIS — S82401D Unspecified fracture of shaft of right fibula, subsequent encounter for closed fracture with routine healing: Secondary | ICD-10-CM | POA: Diagnosis not present

## 2016-05-12 DIAGNOSIS — Z9181 History of falling: Secondary | ICD-10-CM | POA: Diagnosis not present

## 2016-05-13 DIAGNOSIS — S82201D Unspecified fracture of shaft of right tibia, subsequent encounter for closed fracture with routine healing: Secondary | ICD-10-CM | POA: Diagnosis not present

## 2016-05-13 DIAGNOSIS — Z9181 History of falling: Secondary | ICD-10-CM | POA: Diagnosis not present

## 2016-05-13 DIAGNOSIS — S82401D Unspecified fracture of shaft of right fibula, subsequent encounter for closed fracture with routine healing: Secondary | ICD-10-CM | POA: Diagnosis not present

## 2016-05-13 DIAGNOSIS — M6281 Muscle weakness (generalized): Secondary | ICD-10-CM | POA: Diagnosis not present

## 2016-05-13 DIAGNOSIS — R262 Difficulty in walking, not elsewhere classified: Secondary | ICD-10-CM | POA: Diagnosis not present

## 2016-05-14 DIAGNOSIS — S82401D Unspecified fracture of shaft of right fibula, subsequent encounter for closed fracture with routine healing: Secondary | ICD-10-CM | POA: Diagnosis not present

## 2016-05-14 DIAGNOSIS — R262 Difficulty in walking, not elsewhere classified: Secondary | ICD-10-CM | POA: Diagnosis not present

## 2016-05-14 DIAGNOSIS — Z9181 History of falling: Secondary | ICD-10-CM | POA: Diagnosis not present

## 2016-05-14 DIAGNOSIS — S82201D Unspecified fracture of shaft of right tibia, subsequent encounter for closed fracture with routine healing: Secondary | ICD-10-CM | POA: Diagnosis not present

## 2016-05-14 DIAGNOSIS — M6281 Muscle weakness (generalized): Secondary | ICD-10-CM | POA: Diagnosis not present

## 2016-05-17 DIAGNOSIS — Z9181 History of falling: Secondary | ICD-10-CM | POA: Diagnosis not present

## 2016-05-17 DIAGNOSIS — S82401D Unspecified fracture of shaft of right fibula, subsequent encounter for closed fracture with routine healing: Secondary | ICD-10-CM | POA: Diagnosis not present

## 2016-05-17 DIAGNOSIS — R262 Difficulty in walking, not elsewhere classified: Secondary | ICD-10-CM | POA: Diagnosis not present

## 2016-05-17 DIAGNOSIS — M6281 Muscle weakness (generalized): Secondary | ICD-10-CM | POA: Diagnosis not present

## 2016-05-17 DIAGNOSIS — S82201D Unspecified fracture of shaft of right tibia, subsequent encounter for closed fracture with routine healing: Secondary | ICD-10-CM | POA: Diagnosis not present

## 2016-05-18 DIAGNOSIS — M6281 Muscle weakness (generalized): Secondary | ICD-10-CM | POA: Diagnosis not present

## 2016-05-18 DIAGNOSIS — R262 Difficulty in walking, not elsewhere classified: Secondary | ICD-10-CM | POA: Diagnosis not present

## 2016-05-18 DIAGNOSIS — S82201D Unspecified fracture of shaft of right tibia, subsequent encounter for closed fracture with routine healing: Secondary | ICD-10-CM | POA: Diagnosis not present

## 2016-05-18 DIAGNOSIS — Z9181 History of falling: Secondary | ICD-10-CM | POA: Diagnosis not present

## 2016-05-18 DIAGNOSIS — S82401D Unspecified fracture of shaft of right fibula, subsequent encounter for closed fracture with routine healing: Secondary | ICD-10-CM | POA: Diagnosis not present

## 2016-05-19 DIAGNOSIS — Z9181 History of falling: Secondary | ICD-10-CM | POA: Diagnosis not present

## 2016-05-19 DIAGNOSIS — M6281 Muscle weakness (generalized): Secondary | ICD-10-CM | POA: Diagnosis not present

## 2016-05-19 DIAGNOSIS — S82201D Unspecified fracture of shaft of right tibia, subsequent encounter for closed fracture with routine healing: Secondary | ICD-10-CM | POA: Diagnosis not present

## 2016-05-19 DIAGNOSIS — S82401D Unspecified fracture of shaft of right fibula, subsequent encounter for closed fracture with routine healing: Secondary | ICD-10-CM | POA: Diagnosis not present

## 2016-05-19 DIAGNOSIS — R262 Difficulty in walking, not elsewhere classified: Secondary | ICD-10-CM | POA: Diagnosis not present

## 2016-05-21 DIAGNOSIS — S82201D Unspecified fracture of shaft of right tibia, subsequent encounter for closed fracture with routine healing: Secondary | ICD-10-CM | POA: Diagnosis not present

## 2016-05-21 DIAGNOSIS — R262 Difficulty in walking, not elsewhere classified: Secondary | ICD-10-CM | POA: Diagnosis not present

## 2016-05-21 DIAGNOSIS — Z9181 History of falling: Secondary | ICD-10-CM | POA: Diagnosis not present

## 2016-05-21 DIAGNOSIS — S82401D Unspecified fracture of shaft of right fibula, subsequent encounter for closed fracture with routine healing: Secondary | ICD-10-CM | POA: Diagnosis not present

## 2016-05-21 DIAGNOSIS — M6281 Muscle weakness (generalized): Secondary | ICD-10-CM | POA: Diagnosis not present

## 2016-05-24 DIAGNOSIS — M6281 Muscle weakness (generalized): Secondary | ICD-10-CM | POA: Diagnosis not present

## 2016-05-24 DIAGNOSIS — S82401D Unspecified fracture of shaft of right fibula, subsequent encounter for closed fracture with routine healing: Secondary | ICD-10-CM | POA: Diagnosis not present

## 2016-05-24 DIAGNOSIS — Z9181 History of falling: Secondary | ICD-10-CM | POA: Diagnosis not present

## 2016-05-24 DIAGNOSIS — R262 Difficulty in walking, not elsewhere classified: Secondary | ICD-10-CM | POA: Diagnosis not present

## 2016-05-24 DIAGNOSIS — S82201D Unspecified fracture of shaft of right tibia, subsequent encounter for closed fracture with routine healing: Secondary | ICD-10-CM | POA: Diagnosis not present

## 2016-05-25 DIAGNOSIS — S82201D Unspecified fracture of shaft of right tibia, subsequent encounter for closed fracture with routine healing: Secondary | ICD-10-CM | POA: Diagnosis not present

## 2016-05-25 DIAGNOSIS — M6281 Muscle weakness (generalized): Secondary | ICD-10-CM | POA: Diagnosis not present

## 2016-05-25 DIAGNOSIS — S82401D Unspecified fracture of shaft of right fibula, subsequent encounter for closed fracture with routine healing: Secondary | ICD-10-CM | POA: Diagnosis not present

## 2016-05-25 DIAGNOSIS — Z9181 History of falling: Secondary | ICD-10-CM | POA: Diagnosis not present

## 2016-05-25 DIAGNOSIS — R262 Difficulty in walking, not elsewhere classified: Secondary | ICD-10-CM | POA: Diagnosis not present

## 2016-05-27 DIAGNOSIS — M6281 Muscle weakness (generalized): Secondary | ICD-10-CM | POA: Diagnosis not present

## 2016-05-27 DIAGNOSIS — R262 Difficulty in walking, not elsewhere classified: Secondary | ICD-10-CM | POA: Diagnosis not present

## 2016-05-27 DIAGNOSIS — Z9181 History of falling: Secondary | ICD-10-CM | POA: Diagnosis not present

## 2016-05-27 DIAGNOSIS — S82201D Unspecified fracture of shaft of right tibia, subsequent encounter for closed fracture with routine healing: Secondary | ICD-10-CM | POA: Diagnosis not present

## 2016-05-27 DIAGNOSIS — S82401D Unspecified fracture of shaft of right fibula, subsequent encounter for closed fracture with routine healing: Secondary | ICD-10-CM | POA: Diagnosis not present

## 2016-05-28 DIAGNOSIS — Z9181 History of falling: Secondary | ICD-10-CM | POA: Diagnosis not present

## 2016-05-28 DIAGNOSIS — M6281 Muscle weakness (generalized): Secondary | ICD-10-CM | POA: Diagnosis not present

## 2016-05-28 DIAGNOSIS — S82401D Unspecified fracture of shaft of right fibula, subsequent encounter for closed fracture with routine healing: Secondary | ICD-10-CM | POA: Diagnosis not present

## 2016-05-28 DIAGNOSIS — S82201D Unspecified fracture of shaft of right tibia, subsequent encounter for closed fracture with routine healing: Secondary | ICD-10-CM | POA: Diagnosis not present

## 2016-05-28 DIAGNOSIS — R262 Difficulty in walking, not elsewhere classified: Secondary | ICD-10-CM | POA: Diagnosis not present

## 2016-05-31 DIAGNOSIS — S82201D Unspecified fracture of shaft of right tibia, subsequent encounter for closed fracture with routine healing: Secondary | ICD-10-CM | POA: Diagnosis not present

## 2016-05-31 DIAGNOSIS — R262 Difficulty in walking, not elsewhere classified: Secondary | ICD-10-CM | POA: Diagnosis not present

## 2016-05-31 DIAGNOSIS — M6281 Muscle weakness (generalized): Secondary | ICD-10-CM | POA: Diagnosis not present

## 2016-05-31 DIAGNOSIS — Z9181 History of falling: Secondary | ICD-10-CM | POA: Diagnosis not present

## 2016-05-31 DIAGNOSIS — S82401D Unspecified fracture of shaft of right fibula, subsequent encounter for closed fracture with routine healing: Secondary | ICD-10-CM | POA: Diagnosis not present

## 2016-06-01 DIAGNOSIS — M6281 Muscle weakness (generalized): Secondary | ICD-10-CM | POA: Diagnosis not present

## 2016-06-01 DIAGNOSIS — S82401D Unspecified fracture of shaft of right fibula, subsequent encounter for closed fracture with routine healing: Secondary | ICD-10-CM | POA: Diagnosis not present

## 2016-06-01 DIAGNOSIS — R262 Difficulty in walking, not elsewhere classified: Secondary | ICD-10-CM | POA: Diagnosis not present

## 2016-06-01 DIAGNOSIS — S82201D Unspecified fracture of shaft of right tibia, subsequent encounter for closed fracture with routine healing: Secondary | ICD-10-CM | POA: Diagnosis not present

## 2016-06-01 DIAGNOSIS — Z9181 History of falling: Secondary | ICD-10-CM | POA: Diagnosis not present

## 2016-06-03 DIAGNOSIS — M6281 Muscle weakness (generalized): Secondary | ICD-10-CM | POA: Diagnosis not present

## 2016-06-03 DIAGNOSIS — R262 Difficulty in walking, not elsewhere classified: Secondary | ICD-10-CM | POA: Diagnosis not present

## 2016-06-03 DIAGNOSIS — Z9181 History of falling: Secondary | ICD-10-CM | POA: Diagnosis not present

## 2016-06-03 DIAGNOSIS — S82401D Unspecified fracture of shaft of right fibula, subsequent encounter for closed fracture with routine healing: Secondary | ICD-10-CM | POA: Diagnosis not present

## 2016-06-03 DIAGNOSIS — S82201D Unspecified fracture of shaft of right tibia, subsequent encounter for closed fracture with routine healing: Secondary | ICD-10-CM | POA: Diagnosis not present

## 2016-06-07 DIAGNOSIS — R262 Difficulty in walking, not elsewhere classified: Secondary | ICD-10-CM | POA: Diagnosis not present

## 2016-06-07 DIAGNOSIS — M6281 Muscle weakness (generalized): Secondary | ICD-10-CM | POA: Diagnosis not present

## 2016-06-07 DIAGNOSIS — Z9181 History of falling: Secondary | ICD-10-CM | POA: Diagnosis not present

## 2016-06-07 DIAGNOSIS — S82401D Unspecified fracture of shaft of right fibula, subsequent encounter for closed fracture with routine healing: Secondary | ICD-10-CM | POA: Diagnosis not present

## 2016-06-07 DIAGNOSIS — S82201D Unspecified fracture of shaft of right tibia, subsequent encounter for closed fracture with routine healing: Secondary | ICD-10-CM | POA: Diagnosis not present

## 2016-06-08 DIAGNOSIS — R262 Difficulty in walking, not elsewhere classified: Secondary | ICD-10-CM | POA: Diagnosis not present

## 2016-06-08 DIAGNOSIS — Z9181 History of falling: Secondary | ICD-10-CM | POA: Diagnosis not present

## 2016-06-08 DIAGNOSIS — S82401D Unspecified fracture of shaft of right fibula, subsequent encounter for closed fracture with routine healing: Secondary | ICD-10-CM | POA: Diagnosis not present

## 2016-06-08 DIAGNOSIS — S82201D Unspecified fracture of shaft of right tibia, subsequent encounter for closed fracture with routine healing: Secondary | ICD-10-CM | POA: Diagnosis not present

## 2016-06-08 DIAGNOSIS — M6281 Muscle weakness (generalized): Secondary | ICD-10-CM | POA: Diagnosis not present

## 2016-06-10 DIAGNOSIS — R262 Difficulty in walking, not elsewhere classified: Secondary | ICD-10-CM | POA: Diagnosis not present

## 2016-06-10 DIAGNOSIS — S82201D Unspecified fracture of shaft of right tibia, subsequent encounter for closed fracture with routine healing: Secondary | ICD-10-CM | POA: Diagnosis not present

## 2016-06-10 DIAGNOSIS — S82401D Unspecified fracture of shaft of right fibula, subsequent encounter for closed fracture with routine healing: Secondary | ICD-10-CM | POA: Diagnosis not present

## 2016-06-10 DIAGNOSIS — M6281 Muscle weakness (generalized): Secondary | ICD-10-CM | POA: Diagnosis not present

## 2016-06-10 DIAGNOSIS — Z9181 History of falling: Secondary | ICD-10-CM | POA: Diagnosis not present

## 2016-06-14 DIAGNOSIS — R4182 Altered mental status, unspecified: Secondary | ICD-10-CM | POA: Diagnosis not present

## 2016-06-14 DIAGNOSIS — Z5181 Encounter for therapeutic drug level monitoring: Secondary | ICD-10-CM | POA: Diagnosis not present

## 2016-06-16 DIAGNOSIS — M6281 Muscle weakness (generalized): Secondary | ICD-10-CM | POA: Diagnosis not present

## 2016-06-16 DIAGNOSIS — Z9181 History of falling: Secondary | ICD-10-CM | POA: Diagnosis not present

## 2016-06-16 DIAGNOSIS — S82201D Unspecified fracture of shaft of right tibia, subsequent encounter for closed fracture with routine healing: Secondary | ICD-10-CM | POA: Diagnosis not present

## 2016-06-16 DIAGNOSIS — S82401D Unspecified fracture of shaft of right fibula, subsequent encounter for closed fracture with routine healing: Secondary | ICD-10-CM | POA: Diagnosis not present

## 2016-06-16 DIAGNOSIS — R262 Difficulty in walking, not elsewhere classified: Secondary | ICD-10-CM | POA: Diagnosis not present

## 2016-06-17 DIAGNOSIS — S82401D Unspecified fracture of shaft of right fibula, subsequent encounter for closed fracture with routine healing: Secondary | ICD-10-CM | POA: Diagnosis not present

## 2016-06-17 DIAGNOSIS — S82201D Unspecified fracture of shaft of right tibia, subsequent encounter for closed fracture with routine healing: Secondary | ICD-10-CM | POA: Diagnosis not present

## 2016-06-17 DIAGNOSIS — M6281 Muscle weakness (generalized): Secondary | ICD-10-CM | POA: Diagnosis not present

## 2016-06-17 DIAGNOSIS — Z9181 History of falling: Secondary | ICD-10-CM | POA: Diagnosis not present

## 2016-06-17 DIAGNOSIS — R262 Difficulty in walking, not elsewhere classified: Secondary | ICD-10-CM | POA: Diagnosis not present

## 2016-06-21 DIAGNOSIS — M6281 Muscle weakness (generalized): Secondary | ICD-10-CM | POA: Diagnosis not present

## 2016-06-21 DIAGNOSIS — S82201D Unspecified fracture of shaft of right tibia, subsequent encounter for closed fracture with routine healing: Secondary | ICD-10-CM | POA: Diagnosis not present

## 2016-06-21 DIAGNOSIS — Z9181 History of falling: Secondary | ICD-10-CM | POA: Diagnosis not present

## 2016-06-21 DIAGNOSIS — S82401D Unspecified fracture of shaft of right fibula, subsequent encounter for closed fracture with routine healing: Secondary | ICD-10-CM | POA: Diagnosis not present

## 2016-06-21 DIAGNOSIS — R262 Difficulty in walking, not elsewhere classified: Secondary | ICD-10-CM | POA: Diagnosis not present

## 2016-06-22 DIAGNOSIS — M6281 Muscle weakness (generalized): Secondary | ICD-10-CM | POA: Diagnosis not present

## 2016-06-22 DIAGNOSIS — R262 Difficulty in walking, not elsewhere classified: Secondary | ICD-10-CM | POA: Diagnosis not present

## 2016-06-22 DIAGNOSIS — S82401D Unspecified fracture of shaft of right fibula, subsequent encounter for closed fracture with routine healing: Secondary | ICD-10-CM | POA: Diagnosis not present

## 2016-06-22 DIAGNOSIS — Z9181 History of falling: Secondary | ICD-10-CM | POA: Diagnosis not present

## 2016-06-22 DIAGNOSIS — S82201D Unspecified fracture of shaft of right tibia, subsequent encounter for closed fracture with routine healing: Secondary | ICD-10-CM | POA: Diagnosis not present

## 2016-06-24 DIAGNOSIS — Z9181 History of falling: Secondary | ICD-10-CM | POA: Diagnosis not present

## 2016-06-24 DIAGNOSIS — S82401D Unspecified fracture of shaft of right fibula, subsequent encounter for closed fracture with routine healing: Secondary | ICD-10-CM | POA: Diagnosis not present

## 2016-06-24 DIAGNOSIS — R262 Difficulty in walking, not elsewhere classified: Secondary | ICD-10-CM | POA: Diagnosis not present

## 2016-06-24 DIAGNOSIS — M6281 Muscle weakness (generalized): Secondary | ICD-10-CM | POA: Diagnosis not present

## 2016-06-24 DIAGNOSIS — S82201D Unspecified fracture of shaft of right tibia, subsequent encounter for closed fracture with routine healing: Secondary | ICD-10-CM | POA: Diagnosis not present

## 2016-06-28 DIAGNOSIS — R262 Difficulty in walking, not elsewhere classified: Secondary | ICD-10-CM | POA: Diagnosis not present

## 2016-06-28 DIAGNOSIS — S82201D Unspecified fracture of shaft of right tibia, subsequent encounter for closed fracture with routine healing: Secondary | ICD-10-CM | POA: Diagnosis not present

## 2016-06-28 DIAGNOSIS — M6281 Muscle weakness (generalized): Secondary | ICD-10-CM | POA: Diagnosis not present

## 2016-06-28 DIAGNOSIS — Z9181 History of falling: Secondary | ICD-10-CM | POA: Diagnosis not present

## 2016-06-28 DIAGNOSIS — S82401D Unspecified fracture of shaft of right fibula, subsequent encounter for closed fracture with routine healing: Secondary | ICD-10-CM | POA: Diagnosis not present

## 2016-06-29 DIAGNOSIS — Z9181 History of falling: Secondary | ICD-10-CM | POA: Diagnosis not present

## 2016-06-29 DIAGNOSIS — S82401D Unspecified fracture of shaft of right fibula, subsequent encounter for closed fracture with routine healing: Secondary | ICD-10-CM | POA: Diagnosis not present

## 2016-06-29 DIAGNOSIS — R262 Difficulty in walking, not elsewhere classified: Secondary | ICD-10-CM | POA: Diagnosis not present

## 2016-06-29 DIAGNOSIS — S82201D Unspecified fracture of shaft of right tibia, subsequent encounter for closed fracture with routine healing: Secondary | ICD-10-CM | POA: Diagnosis not present

## 2016-06-29 DIAGNOSIS — M6281 Muscle weakness (generalized): Secondary | ICD-10-CM | POA: Diagnosis not present

## 2016-07-02 DIAGNOSIS — S82401D Unspecified fracture of shaft of right fibula, subsequent encounter for closed fracture with routine healing: Secondary | ICD-10-CM | POA: Diagnosis not present

## 2016-07-02 DIAGNOSIS — G40909 Epilepsy, unspecified, not intractable, without status epilepticus: Secondary | ICD-10-CM | POA: Diagnosis not present

## 2016-07-02 DIAGNOSIS — Z9181 History of falling: Secondary | ICD-10-CM | POA: Diagnosis not present

## 2016-07-02 DIAGNOSIS — R262 Difficulty in walking, not elsewhere classified: Secondary | ICD-10-CM | POA: Diagnosis not present

## 2016-07-02 DIAGNOSIS — M6281 Muscle weakness (generalized): Secondary | ICD-10-CM | POA: Diagnosis not present

## 2016-07-02 DIAGNOSIS — F79 Unspecified intellectual disabilities: Secondary | ICD-10-CM | POA: Diagnosis not present

## 2016-07-02 DIAGNOSIS — Z4789 Encounter for other orthopedic aftercare: Secondary | ICD-10-CM | POA: Diagnosis not present

## 2016-07-02 DIAGNOSIS — S82201D Unspecified fracture of shaft of right tibia, subsequent encounter for closed fracture with routine healing: Secondary | ICD-10-CM | POA: Diagnosis not present

## 2016-07-05 DIAGNOSIS — S82401D Unspecified fracture of shaft of right fibula, subsequent encounter for closed fracture with routine healing: Secondary | ICD-10-CM | POA: Diagnosis not present

## 2016-07-05 DIAGNOSIS — R262 Difficulty in walking, not elsewhere classified: Secondary | ICD-10-CM | POA: Diagnosis not present

## 2016-07-05 DIAGNOSIS — S82201D Unspecified fracture of shaft of right tibia, subsequent encounter for closed fracture with routine healing: Secondary | ICD-10-CM | POA: Diagnosis not present

## 2016-07-05 DIAGNOSIS — Z9181 History of falling: Secondary | ICD-10-CM | POA: Diagnosis not present

## 2016-07-05 DIAGNOSIS — M6281 Muscle weakness (generalized): Secondary | ICD-10-CM | POA: Diagnosis not present

## 2016-07-06 DIAGNOSIS — S82401D Unspecified fracture of shaft of right fibula, subsequent encounter for closed fracture with routine healing: Secondary | ICD-10-CM | POA: Diagnosis not present

## 2016-07-06 DIAGNOSIS — R262 Difficulty in walking, not elsewhere classified: Secondary | ICD-10-CM | POA: Diagnosis not present

## 2016-07-06 DIAGNOSIS — Z9181 History of falling: Secondary | ICD-10-CM | POA: Diagnosis not present

## 2016-07-06 DIAGNOSIS — S82201D Unspecified fracture of shaft of right tibia, subsequent encounter for closed fracture with routine healing: Secondary | ICD-10-CM | POA: Diagnosis not present

## 2016-07-06 DIAGNOSIS — M6281 Muscle weakness (generalized): Secondary | ICD-10-CM | POA: Diagnosis not present

## 2016-07-08 DIAGNOSIS — S82401D Unspecified fracture of shaft of right fibula, subsequent encounter for closed fracture with routine healing: Secondary | ICD-10-CM | POA: Diagnosis not present

## 2016-07-08 DIAGNOSIS — R262 Difficulty in walking, not elsewhere classified: Secondary | ICD-10-CM | POA: Diagnosis not present

## 2016-07-08 DIAGNOSIS — M6281 Muscle weakness (generalized): Secondary | ICD-10-CM | POA: Diagnosis not present

## 2016-07-08 DIAGNOSIS — Z9181 History of falling: Secondary | ICD-10-CM | POA: Diagnosis not present

## 2016-07-08 DIAGNOSIS — S82201D Unspecified fracture of shaft of right tibia, subsequent encounter for closed fracture with routine healing: Secondary | ICD-10-CM | POA: Diagnosis not present

## 2016-07-14 DIAGNOSIS — M6281 Muscle weakness (generalized): Secondary | ICD-10-CM | POA: Diagnosis not present

## 2016-07-14 DIAGNOSIS — S82401D Unspecified fracture of shaft of right fibula, subsequent encounter for closed fracture with routine healing: Secondary | ICD-10-CM | POA: Diagnosis not present

## 2016-07-14 DIAGNOSIS — R262 Difficulty in walking, not elsewhere classified: Secondary | ICD-10-CM | POA: Diagnosis not present

## 2016-07-14 DIAGNOSIS — Z9181 History of falling: Secondary | ICD-10-CM | POA: Diagnosis not present

## 2016-07-14 DIAGNOSIS — S82201D Unspecified fracture of shaft of right tibia, subsequent encounter for closed fracture with routine healing: Secondary | ICD-10-CM | POA: Diagnosis not present

## 2016-07-15 DIAGNOSIS — M6281 Muscle weakness (generalized): Secondary | ICD-10-CM | POA: Diagnosis not present

## 2016-07-15 DIAGNOSIS — R262 Difficulty in walking, not elsewhere classified: Secondary | ICD-10-CM | POA: Diagnosis not present

## 2016-07-15 DIAGNOSIS — S82401D Unspecified fracture of shaft of right fibula, subsequent encounter for closed fracture with routine healing: Secondary | ICD-10-CM | POA: Diagnosis not present

## 2016-07-15 DIAGNOSIS — Z9181 History of falling: Secondary | ICD-10-CM | POA: Diagnosis not present

## 2016-07-15 DIAGNOSIS — S82201D Unspecified fracture of shaft of right tibia, subsequent encounter for closed fracture with routine healing: Secondary | ICD-10-CM | POA: Diagnosis not present

## 2016-07-16 DIAGNOSIS — S82401D Unspecified fracture of shaft of right fibula, subsequent encounter for closed fracture with routine healing: Secondary | ICD-10-CM | POA: Diagnosis not present

## 2016-07-16 DIAGNOSIS — R262 Difficulty in walking, not elsewhere classified: Secondary | ICD-10-CM | POA: Diagnosis not present

## 2016-07-16 DIAGNOSIS — Z9181 History of falling: Secondary | ICD-10-CM | POA: Diagnosis not present

## 2016-07-16 DIAGNOSIS — M6281 Muscle weakness (generalized): Secondary | ICD-10-CM | POA: Diagnosis not present

## 2016-07-16 DIAGNOSIS — S82201D Unspecified fracture of shaft of right tibia, subsequent encounter for closed fracture with routine healing: Secondary | ICD-10-CM | POA: Diagnosis not present

## 2016-07-19 DIAGNOSIS — S82201D Unspecified fracture of shaft of right tibia, subsequent encounter for closed fracture with routine healing: Secondary | ICD-10-CM | POA: Diagnosis not present

## 2016-07-19 DIAGNOSIS — R262 Difficulty in walking, not elsewhere classified: Secondary | ICD-10-CM | POA: Diagnosis not present

## 2016-07-19 DIAGNOSIS — M6281 Muscle weakness (generalized): Secondary | ICD-10-CM | POA: Diagnosis not present

## 2016-07-19 DIAGNOSIS — Z9181 History of falling: Secondary | ICD-10-CM | POA: Diagnosis not present

## 2016-07-19 DIAGNOSIS — S82401D Unspecified fracture of shaft of right fibula, subsequent encounter for closed fracture with routine healing: Secondary | ICD-10-CM | POA: Diagnosis not present

## 2016-07-20 DIAGNOSIS — R262 Difficulty in walking, not elsewhere classified: Secondary | ICD-10-CM | POA: Diagnosis not present

## 2016-07-20 DIAGNOSIS — S82201D Unspecified fracture of shaft of right tibia, subsequent encounter for closed fracture with routine healing: Secondary | ICD-10-CM | POA: Diagnosis not present

## 2016-07-20 DIAGNOSIS — Z9181 History of falling: Secondary | ICD-10-CM | POA: Diagnosis not present

## 2016-07-20 DIAGNOSIS — M6281 Muscle weakness (generalized): Secondary | ICD-10-CM | POA: Diagnosis not present

## 2016-07-20 DIAGNOSIS — S82401D Unspecified fracture of shaft of right fibula, subsequent encounter for closed fracture with routine healing: Secondary | ICD-10-CM | POA: Diagnosis not present

## 2016-07-22 DIAGNOSIS — S82201D Unspecified fracture of shaft of right tibia, subsequent encounter for closed fracture with routine healing: Secondary | ICD-10-CM | POA: Diagnosis not present

## 2016-07-22 DIAGNOSIS — M6281 Muscle weakness (generalized): Secondary | ICD-10-CM | POA: Diagnosis not present

## 2016-07-22 DIAGNOSIS — Z9181 History of falling: Secondary | ICD-10-CM | POA: Diagnosis not present

## 2016-07-22 DIAGNOSIS — R262 Difficulty in walking, not elsewhere classified: Secondary | ICD-10-CM | POA: Diagnosis not present

## 2016-07-22 DIAGNOSIS — S82401D Unspecified fracture of shaft of right fibula, subsequent encounter for closed fracture with routine healing: Secondary | ICD-10-CM | POA: Diagnosis not present

## 2016-07-26 DIAGNOSIS — M6281 Muscle weakness (generalized): Secondary | ICD-10-CM | POA: Diagnosis not present

## 2016-07-26 DIAGNOSIS — Z9181 History of falling: Secondary | ICD-10-CM | POA: Diagnosis not present

## 2016-07-26 DIAGNOSIS — S82201D Unspecified fracture of shaft of right tibia, subsequent encounter for closed fracture with routine healing: Secondary | ICD-10-CM | POA: Diagnosis not present

## 2016-07-26 DIAGNOSIS — S82401D Unspecified fracture of shaft of right fibula, subsequent encounter for closed fracture with routine healing: Secondary | ICD-10-CM | POA: Diagnosis not present

## 2016-07-26 DIAGNOSIS — R262 Difficulty in walking, not elsewhere classified: Secondary | ICD-10-CM | POA: Diagnosis not present

## 2016-07-27 DIAGNOSIS — M6281 Muscle weakness (generalized): Secondary | ICD-10-CM | POA: Diagnosis not present

## 2016-07-27 DIAGNOSIS — S82201D Unspecified fracture of shaft of right tibia, subsequent encounter for closed fracture with routine healing: Secondary | ICD-10-CM | POA: Diagnosis not present

## 2016-07-27 DIAGNOSIS — Z9181 History of falling: Secondary | ICD-10-CM | POA: Diagnosis not present

## 2016-07-27 DIAGNOSIS — R262 Difficulty in walking, not elsewhere classified: Secondary | ICD-10-CM | POA: Diagnosis not present

## 2016-07-27 DIAGNOSIS — S82401D Unspecified fracture of shaft of right fibula, subsequent encounter for closed fracture with routine healing: Secondary | ICD-10-CM | POA: Diagnosis not present

## 2016-07-29 DIAGNOSIS — Z9181 History of falling: Secondary | ICD-10-CM | POA: Diagnosis not present

## 2016-07-29 DIAGNOSIS — S82201D Unspecified fracture of shaft of right tibia, subsequent encounter for closed fracture with routine healing: Secondary | ICD-10-CM | POA: Diagnosis not present

## 2016-07-29 DIAGNOSIS — R262 Difficulty in walking, not elsewhere classified: Secondary | ICD-10-CM | POA: Diagnosis not present

## 2016-07-29 DIAGNOSIS — M6281 Muscle weakness (generalized): Secondary | ICD-10-CM | POA: Diagnosis not present

## 2016-07-29 DIAGNOSIS — S82401D Unspecified fracture of shaft of right fibula, subsequent encounter for closed fracture with routine healing: Secondary | ICD-10-CM | POA: Diagnosis not present

## 2016-08-02 DIAGNOSIS — M6281 Muscle weakness (generalized): Secondary | ICD-10-CM | POA: Diagnosis not present

## 2016-08-02 DIAGNOSIS — S82401D Unspecified fracture of shaft of right fibula, subsequent encounter for closed fracture with routine healing: Secondary | ICD-10-CM | POA: Diagnosis not present

## 2016-08-02 DIAGNOSIS — R262 Difficulty in walking, not elsewhere classified: Secondary | ICD-10-CM | POA: Diagnosis not present

## 2016-08-02 DIAGNOSIS — S82201D Unspecified fracture of shaft of right tibia, subsequent encounter for closed fracture with routine healing: Secondary | ICD-10-CM | POA: Diagnosis not present

## 2016-08-02 DIAGNOSIS — Z9181 History of falling: Secondary | ICD-10-CM | POA: Diagnosis not present

## 2016-08-04 DIAGNOSIS — S82401D Unspecified fracture of shaft of right fibula, subsequent encounter for closed fracture with routine healing: Secondary | ICD-10-CM | POA: Diagnosis not present

## 2016-08-04 DIAGNOSIS — R262 Difficulty in walking, not elsewhere classified: Secondary | ICD-10-CM | POA: Diagnosis not present

## 2016-08-04 DIAGNOSIS — M6281 Muscle weakness (generalized): Secondary | ICD-10-CM | POA: Diagnosis not present

## 2016-08-04 DIAGNOSIS — S82201D Unspecified fracture of shaft of right tibia, subsequent encounter for closed fracture with routine healing: Secondary | ICD-10-CM | POA: Diagnosis not present

## 2016-08-04 DIAGNOSIS — Z9181 History of falling: Secondary | ICD-10-CM | POA: Diagnosis not present

## 2016-08-10 DIAGNOSIS — M6281 Muscle weakness (generalized): Secondary | ICD-10-CM | POA: Diagnosis not present

## 2016-08-10 DIAGNOSIS — S82401D Unspecified fracture of shaft of right fibula, subsequent encounter for closed fracture with routine healing: Secondary | ICD-10-CM | POA: Diagnosis not present

## 2016-08-10 DIAGNOSIS — Z9181 History of falling: Secondary | ICD-10-CM | POA: Diagnosis not present

## 2016-08-10 DIAGNOSIS — S82201D Unspecified fracture of shaft of right tibia, subsequent encounter for closed fracture with routine healing: Secondary | ICD-10-CM | POA: Diagnosis not present

## 2016-08-10 DIAGNOSIS — R262 Difficulty in walking, not elsewhere classified: Secondary | ICD-10-CM | POA: Diagnosis not present

## 2016-08-11 DIAGNOSIS — M6281 Muscle weakness (generalized): Secondary | ICD-10-CM | POA: Diagnosis not present

## 2016-08-11 DIAGNOSIS — S82401D Unspecified fracture of shaft of right fibula, subsequent encounter for closed fracture with routine healing: Secondary | ICD-10-CM | POA: Diagnosis not present

## 2016-08-11 DIAGNOSIS — S82201D Unspecified fracture of shaft of right tibia, subsequent encounter for closed fracture with routine healing: Secondary | ICD-10-CM | POA: Diagnosis not present

## 2016-08-11 DIAGNOSIS — Z9181 History of falling: Secondary | ICD-10-CM | POA: Diagnosis not present

## 2016-08-11 DIAGNOSIS — R262 Difficulty in walking, not elsewhere classified: Secondary | ICD-10-CM | POA: Diagnosis not present

## 2016-08-13 DIAGNOSIS — S82401D Unspecified fracture of shaft of right fibula, subsequent encounter for closed fracture with routine healing: Secondary | ICD-10-CM | POA: Diagnosis not present

## 2016-08-13 DIAGNOSIS — Z9181 History of falling: Secondary | ICD-10-CM | POA: Diagnosis not present

## 2016-08-13 DIAGNOSIS — R262 Difficulty in walking, not elsewhere classified: Secondary | ICD-10-CM | POA: Diagnosis not present

## 2016-08-13 DIAGNOSIS — S82201D Unspecified fracture of shaft of right tibia, subsequent encounter for closed fracture with routine healing: Secondary | ICD-10-CM | POA: Diagnosis not present

## 2016-08-13 DIAGNOSIS — M6281 Muscle weakness (generalized): Secondary | ICD-10-CM | POA: Diagnosis not present

## 2016-08-17 DIAGNOSIS — S82201D Unspecified fracture of shaft of right tibia, subsequent encounter for closed fracture with routine healing: Secondary | ICD-10-CM | POA: Diagnosis not present

## 2016-08-17 DIAGNOSIS — S82401D Unspecified fracture of shaft of right fibula, subsequent encounter for closed fracture with routine healing: Secondary | ICD-10-CM | POA: Diagnosis not present

## 2016-08-17 DIAGNOSIS — M6281 Muscle weakness (generalized): Secondary | ICD-10-CM | POA: Diagnosis not present

## 2016-08-17 DIAGNOSIS — Z9181 History of falling: Secondary | ICD-10-CM | POA: Diagnosis not present

## 2016-08-17 DIAGNOSIS — R262 Difficulty in walking, not elsewhere classified: Secondary | ICD-10-CM | POA: Diagnosis not present

## 2016-08-18 DIAGNOSIS — M6281 Muscle weakness (generalized): Secondary | ICD-10-CM | POA: Diagnosis not present

## 2016-08-18 DIAGNOSIS — R262 Difficulty in walking, not elsewhere classified: Secondary | ICD-10-CM | POA: Diagnosis not present

## 2016-08-18 DIAGNOSIS — Z9181 History of falling: Secondary | ICD-10-CM | POA: Diagnosis not present

## 2016-08-18 DIAGNOSIS — S82201D Unspecified fracture of shaft of right tibia, subsequent encounter for closed fracture with routine healing: Secondary | ICD-10-CM | POA: Diagnosis not present

## 2016-08-18 DIAGNOSIS — S82401D Unspecified fracture of shaft of right fibula, subsequent encounter for closed fracture with routine healing: Secondary | ICD-10-CM | POA: Diagnosis not present

## 2016-08-24 DIAGNOSIS — S82401D Unspecified fracture of shaft of right fibula, subsequent encounter for closed fracture with routine healing: Secondary | ICD-10-CM | POA: Diagnosis not present

## 2016-08-24 DIAGNOSIS — R262 Difficulty in walking, not elsewhere classified: Secondary | ICD-10-CM | POA: Diagnosis not present

## 2016-08-24 DIAGNOSIS — M6281 Muscle weakness (generalized): Secondary | ICD-10-CM | POA: Diagnosis not present

## 2016-08-24 DIAGNOSIS — S82201D Unspecified fracture of shaft of right tibia, subsequent encounter for closed fracture with routine healing: Secondary | ICD-10-CM | POA: Diagnosis not present

## 2016-08-24 DIAGNOSIS — Z9181 History of falling: Secondary | ICD-10-CM | POA: Diagnosis not present

## 2016-08-25 DIAGNOSIS — G40909 Epilepsy, unspecified, not intractable, without status epilepticus: Secondary | ICD-10-CM | POA: Diagnosis not present

## 2016-08-25 DIAGNOSIS — Z9181 History of falling: Secondary | ICD-10-CM | POA: Diagnosis not present

## 2016-08-25 DIAGNOSIS — I340 Nonrheumatic mitral (valve) insufficiency: Secondary | ICD-10-CM | POA: Diagnosis not present

## 2016-08-25 DIAGNOSIS — S82401D Unspecified fracture of shaft of right fibula, subsequent encounter for closed fracture with routine healing: Secondary | ICD-10-CM | POA: Diagnosis not present

## 2016-08-25 DIAGNOSIS — Z4789 Encounter for other orthopedic aftercare: Secondary | ICD-10-CM | POA: Diagnosis not present

## 2016-08-25 DIAGNOSIS — S82201D Unspecified fracture of shaft of right tibia, subsequent encounter for closed fracture with routine healing: Secondary | ICD-10-CM | POA: Diagnosis not present

## 2016-08-25 DIAGNOSIS — R262 Difficulty in walking, not elsewhere classified: Secondary | ICD-10-CM | POA: Diagnosis not present

## 2016-08-25 DIAGNOSIS — M6281 Muscle weakness (generalized): Secondary | ICD-10-CM | POA: Diagnosis not present

## 2016-08-31 DIAGNOSIS — Z9181 History of falling: Secondary | ICD-10-CM | POA: Diagnosis not present

## 2016-08-31 DIAGNOSIS — I340 Nonrheumatic mitral (valve) insufficiency: Secondary | ICD-10-CM | POA: Diagnosis not present

## 2016-08-31 DIAGNOSIS — Z4789 Encounter for other orthopedic aftercare: Secondary | ICD-10-CM | POA: Diagnosis not present

## 2016-08-31 DIAGNOSIS — G40909 Epilepsy, unspecified, not intractable, without status epilepticus: Secondary | ICD-10-CM | POA: Diagnosis not present

## 2016-09-21 DIAGNOSIS — D649 Anemia, unspecified: Secondary | ICD-10-CM | POA: Diagnosis not present

## 2016-09-21 DIAGNOSIS — Z5181 Encounter for therapeutic drug level monitoring: Secondary | ICD-10-CM | POA: Diagnosis not present

## 2016-10-05 DIAGNOSIS — F79 Unspecified intellectual disabilities: Secondary | ICD-10-CM | POA: Diagnosis not present

## 2016-10-05 DIAGNOSIS — G40909 Epilepsy, unspecified, not intractable, without status epilepticus: Secondary | ICD-10-CM | POA: Diagnosis not present

## 2016-10-25 DIAGNOSIS — R609 Edema, unspecified: Secondary | ICD-10-CM | POA: Diagnosis not present

## 2016-11-03 DIAGNOSIS — F79 Unspecified intellectual disabilities: Secondary | ICD-10-CM | POA: Diagnosis not present

## 2016-11-03 DIAGNOSIS — G40909 Epilepsy, unspecified, not intractable, without status epilepticus: Secondary | ICD-10-CM | POA: Diagnosis not present

## 2016-12-10 DIAGNOSIS — G40909 Epilepsy, unspecified, not intractable, without status epilepticus: Secondary | ICD-10-CM | POA: Diagnosis not present

## 2016-12-10 DIAGNOSIS — M6281 Muscle weakness (generalized): Secondary | ICD-10-CM | POA: Diagnosis not present

## 2016-12-10 DIAGNOSIS — S82221D Displaced transverse fracture of shaft of right tibia, subsequent encounter for closed fracture with routine healing: Secondary | ICD-10-CM | POA: Diagnosis not present

## 2016-12-10 DIAGNOSIS — F79 Unspecified intellectual disabilities: Secondary | ICD-10-CM | POA: Diagnosis not present

## 2016-12-13 DIAGNOSIS — M6281 Muscle weakness (generalized): Secondary | ICD-10-CM | POA: Diagnosis not present

## 2016-12-13 DIAGNOSIS — S82221D Displaced transverse fracture of shaft of right tibia, subsequent encounter for closed fracture with routine healing: Secondary | ICD-10-CM | POA: Diagnosis not present

## 2016-12-14 DIAGNOSIS — S82221D Displaced transverse fracture of shaft of right tibia, subsequent encounter for closed fracture with routine healing: Secondary | ICD-10-CM | POA: Diagnosis not present

## 2016-12-14 DIAGNOSIS — M6281 Muscle weakness (generalized): Secondary | ICD-10-CM | POA: Diagnosis not present

## 2016-12-15 DIAGNOSIS — M6281 Muscle weakness (generalized): Secondary | ICD-10-CM | POA: Diagnosis not present

## 2016-12-15 DIAGNOSIS — S82221D Displaced transverse fracture of shaft of right tibia, subsequent encounter for closed fracture with routine healing: Secondary | ICD-10-CM | POA: Diagnosis not present

## 2016-12-16 DIAGNOSIS — M6281 Muscle weakness (generalized): Secondary | ICD-10-CM | POA: Diagnosis not present

## 2016-12-16 DIAGNOSIS — S82221D Displaced transverse fracture of shaft of right tibia, subsequent encounter for closed fracture with routine healing: Secondary | ICD-10-CM | POA: Diagnosis not present

## 2016-12-17 DIAGNOSIS — M6281 Muscle weakness (generalized): Secondary | ICD-10-CM | POA: Diagnosis not present

## 2016-12-17 DIAGNOSIS — S82221D Displaced transverse fracture of shaft of right tibia, subsequent encounter for closed fracture with routine healing: Secondary | ICD-10-CM | POA: Diagnosis not present

## 2016-12-20 DIAGNOSIS — S82221D Displaced transverse fracture of shaft of right tibia, subsequent encounter for closed fracture with routine healing: Secondary | ICD-10-CM | POA: Diagnosis not present

## 2016-12-20 DIAGNOSIS — M6281 Muscle weakness (generalized): Secondary | ICD-10-CM | POA: Diagnosis not present

## 2016-12-21 DIAGNOSIS — M6281 Muscle weakness (generalized): Secondary | ICD-10-CM | POA: Diagnosis not present

## 2016-12-21 DIAGNOSIS — S82221D Displaced transverse fracture of shaft of right tibia, subsequent encounter for closed fracture with routine healing: Secondary | ICD-10-CM | POA: Diagnosis not present

## 2016-12-22 DIAGNOSIS — S82221D Displaced transverse fracture of shaft of right tibia, subsequent encounter for closed fracture with routine healing: Secondary | ICD-10-CM | POA: Diagnosis not present

## 2016-12-22 DIAGNOSIS — M6281 Muscle weakness (generalized): Secondary | ICD-10-CM | POA: Diagnosis not present

## 2016-12-23 DIAGNOSIS — S82221D Displaced transverse fracture of shaft of right tibia, subsequent encounter for closed fracture with routine healing: Secondary | ICD-10-CM | POA: Diagnosis not present

## 2016-12-23 DIAGNOSIS — M6281 Muscle weakness (generalized): Secondary | ICD-10-CM | POA: Diagnosis not present

## 2016-12-24 DIAGNOSIS — S82221D Displaced transverse fracture of shaft of right tibia, subsequent encounter for closed fracture with routine healing: Secondary | ICD-10-CM | POA: Diagnosis not present

## 2016-12-24 DIAGNOSIS — M6281 Muscle weakness (generalized): Secondary | ICD-10-CM | POA: Diagnosis not present

## 2016-12-27 DIAGNOSIS — M6281 Muscle weakness (generalized): Secondary | ICD-10-CM | POA: Diagnosis not present

## 2016-12-27 DIAGNOSIS — S82221D Displaced transverse fracture of shaft of right tibia, subsequent encounter for closed fracture with routine healing: Secondary | ICD-10-CM | POA: Diagnosis not present

## 2016-12-28 DIAGNOSIS — S82221D Displaced transverse fracture of shaft of right tibia, subsequent encounter for closed fracture with routine healing: Secondary | ICD-10-CM | POA: Diagnosis not present

## 2016-12-28 DIAGNOSIS — M6281 Muscle weakness (generalized): Secondary | ICD-10-CM | POA: Diagnosis not present

## 2016-12-29 DIAGNOSIS — M6281 Muscle weakness (generalized): Secondary | ICD-10-CM | POA: Diagnosis not present

## 2016-12-29 DIAGNOSIS — S82221D Displaced transverse fracture of shaft of right tibia, subsequent encounter for closed fracture with routine healing: Secondary | ICD-10-CM | POA: Diagnosis not present

## 2016-12-30 DIAGNOSIS — S82221D Displaced transverse fracture of shaft of right tibia, subsequent encounter for closed fracture with routine healing: Secondary | ICD-10-CM | POA: Diagnosis not present

## 2016-12-30 DIAGNOSIS — M6281 Muscle weakness (generalized): Secondary | ICD-10-CM | POA: Diagnosis not present

## 2016-12-31 DIAGNOSIS — G40909 Epilepsy, unspecified, not intractable, without status epilepticus: Secondary | ICD-10-CM | POA: Diagnosis not present

## 2016-12-31 DIAGNOSIS — F79 Unspecified intellectual disabilities: Secondary | ICD-10-CM | POA: Diagnosis not present

## 2016-12-31 DIAGNOSIS — M6281 Muscle weakness (generalized): Secondary | ICD-10-CM | POA: Diagnosis not present

## 2016-12-31 DIAGNOSIS — S82221D Displaced transverse fracture of shaft of right tibia, subsequent encounter for closed fracture with routine healing: Secondary | ICD-10-CM | POA: Diagnosis not present

## 2017-01-04 DIAGNOSIS — S82221D Displaced transverse fracture of shaft of right tibia, subsequent encounter for closed fracture with routine healing: Secondary | ICD-10-CM | POA: Diagnosis not present

## 2017-01-04 DIAGNOSIS — M6281 Muscle weakness (generalized): Secondary | ICD-10-CM | POA: Diagnosis not present

## 2017-01-05 DIAGNOSIS — S82221D Displaced transverse fracture of shaft of right tibia, subsequent encounter for closed fracture with routine healing: Secondary | ICD-10-CM | POA: Diagnosis not present

## 2017-01-05 DIAGNOSIS — M6281 Muscle weakness (generalized): Secondary | ICD-10-CM | POA: Diagnosis not present

## 2017-01-06 DIAGNOSIS — S82221D Displaced transverse fracture of shaft of right tibia, subsequent encounter for closed fracture with routine healing: Secondary | ICD-10-CM | POA: Diagnosis not present

## 2017-01-06 DIAGNOSIS — M6281 Muscle weakness (generalized): Secondary | ICD-10-CM | POA: Diagnosis not present

## 2017-01-19 DIAGNOSIS — Z23 Encounter for immunization: Secondary | ICD-10-CM | POA: Diagnosis not present

## 2017-01-19 DIAGNOSIS — S82221D Displaced transverse fracture of shaft of right tibia, subsequent encounter for closed fracture with routine healing: Secondary | ICD-10-CM | POA: Diagnosis not present

## 2017-02-08 DIAGNOSIS — R632 Polyphagia: Secondary | ICD-10-CM | POA: Diagnosis not present

## 2017-02-08 DIAGNOSIS — L853 Xerosis cutis: Secondary | ICD-10-CM | POA: Diagnosis not present

## 2017-02-09 DIAGNOSIS — D649 Anemia, unspecified: Secondary | ICD-10-CM | POA: Diagnosis not present

## 2017-02-09 DIAGNOSIS — I10 Essential (primary) hypertension: Secondary | ICD-10-CM | POA: Diagnosis not present

## 2017-02-09 DIAGNOSIS — G40909 Epilepsy, unspecified, not intractable, without status epilepticus: Secondary | ICD-10-CM | POA: Diagnosis not present

## 2017-02-09 DIAGNOSIS — F79 Unspecified intellectual disabilities: Secondary | ICD-10-CM | POA: Diagnosis not present

## 2017-02-09 DIAGNOSIS — E669 Obesity, unspecified: Secondary | ICD-10-CM | POA: Diagnosis not present

## 2017-02-20 DIAGNOSIS — F79 Unspecified intellectual disabilities: Secondary | ICD-10-CM | POA: Diagnosis not present

## 2017-02-20 DIAGNOSIS — G40909 Epilepsy, unspecified, not intractable, without status epilepticus: Secondary | ICD-10-CM | POA: Diagnosis not present

## 2017-02-20 DIAGNOSIS — E669 Obesity, unspecified: Secondary | ICD-10-CM | POA: Diagnosis not present

## 2017-03-17 DIAGNOSIS — R569 Unspecified convulsions: Secondary | ICD-10-CM | POA: Diagnosis not present

## 2017-03-22 DIAGNOSIS — F79 Unspecified intellectual disabilities: Secondary | ICD-10-CM | POA: Diagnosis not present

## 2017-03-22 DIAGNOSIS — G40909 Epilepsy, unspecified, not intractable, without status epilepticus: Secondary | ICD-10-CM | POA: Diagnosis not present

## 2017-04-29 DIAGNOSIS — Z5181 Encounter for therapeutic drug level monitoring: Secondary | ICD-10-CM | POA: Diagnosis not present

## 2017-05-24 DIAGNOSIS — G40909 Epilepsy, unspecified, not intractable, without status epilepticus: Secondary | ICD-10-CM | POA: Diagnosis not present

## 2017-05-24 DIAGNOSIS — F79 Unspecified intellectual disabilities: Secondary | ICD-10-CM | POA: Diagnosis not present

## 2017-06-21 DIAGNOSIS — F79 Unspecified intellectual disabilities: Secondary | ICD-10-CM | POA: Diagnosis not present

## 2017-06-21 DIAGNOSIS — R569 Unspecified convulsions: Secondary | ICD-10-CM | POA: Diagnosis not present

## 2017-08-24 DIAGNOSIS — G40909 Epilepsy, unspecified, not intractable, without status epilepticus: Secondary | ICD-10-CM | POA: Diagnosis not present

## 2017-08-24 DIAGNOSIS — E669 Obesity, unspecified: Secondary | ICD-10-CM | POA: Diagnosis not present

## 2017-08-24 DIAGNOSIS — R1310 Dysphagia, unspecified: Secondary | ICD-10-CM | POA: Diagnosis not present

## 2017-08-24 DIAGNOSIS — F79 Unspecified intellectual disabilities: Secondary | ICD-10-CM | POA: Diagnosis not present

## 2017-09-06 DIAGNOSIS — D649 Anemia, unspecified: Secondary | ICD-10-CM | POA: Diagnosis not present

## 2017-09-06 DIAGNOSIS — E875 Hyperkalemia: Secondary | ICD-10-CM | POA: Diagnosis not present

## 2017-10-28 DIAGNOSIS — F79 Unspecified intellectual disabilities: Secondary | ICD-10-CM | POA: Diagnosis not present

## 2017-10-28 DIAGNOSIS — R1310 Dysphagia, unspecified: Secondary | ICD-10-CM | POA: Diagnosis not present

## 2017-10-28 DIAGNOSIS — E669 Obesity, unspecified: Secondary | ICD-10-CM | POA: Diagnosis not present

## 2017-10-28 DIAGNOSIS — G40909 Epilepsy, unspecified, not intractable, without status epilepticus: Secondary | ICD-10-CM | POA: Diagnosis not present

## 2017-11-10 DIAGNOSIS — R600 Localized edema: Secondary | ICD-10-CM | POA: Diagnosis not present

## 2017-12-02 DIAGNOSIS — G4089 Other seizures: Secondary | ICD-10-CM | POA: Diagnosis not present

## 2017-12-02 DIAGNOSIS — Z79899 Other long term (current) drug therapy: Secondary | ICD-10-CM | POA: Diagnosis not present

## 2017-12-02 DIAGNOSIS — R569 Unspecified convulsions: Secondary | ICD-10-CM | POA: Diagnosis not present

## 2017-12-02 DIAGNOSIS — I10 Essential (primary) hypertension: Secondary | ICD-10-CM | POA: Diagnosis not present

## 2017-12-02 DIAGNOSIS — E1151 Type 2 diabetes mellitus with diabetic peripheral angiopathy without gangrene: Secondary | ICD-10-CM | POA: Diagnosis not present

## 2017-12-02 DIAGNOSIS — Z5181 Encounter for therapeutic drug level monitoring: Secondary | ICD-10-CM | POA: Diagnosis not present

## 2017-12-02 DIAGNOSIS — D649 Anemia, unspecified: Secondary | ICD-10-CM | POA: Diagnosis not present

## 2017-12-02 DIAGNOSIS — E119 Type 2 diabetes mellitus without complications: Secondary | ICD-10-CM | POA: Diagnosis not present

## 2017-12-27 DIAGNOSIS — R4182 Altered mental status, unspecified: Secondary | ICD-10-CM | POA: Diagnosis not present

## 2017-12-27 DIAGNOSIS — G40909 Epilepsy, unspecified, not intractable, without status epilepticus: Secondary | ICD-10-CM | POA: Diagnosis not present

## 2017-12-27 DIAGNOSIS — E669 Obesity, unspecified: Secondary | ICD-10-CM | POA: Diagnosis not present

## 2017-12-31 DIAGNOSIS — G4089 Other seizures: Secondary | ICD-10-CM | POA: Diagnosis not present

## 2018-01-02 DIAGNOSIS — R319 Hematuria, unspecified: Secondary | ICD-10-CM | POA: Diagnosis not present

## 2018-01-02 DIAGNOSIS — R569 Unspecified convulsions: Secondary | ICD-10-CM | POA: Diagnosis not present

## 2018-01-02 DIAGNOSIS — D649 Anemia, unspecified: Secondary | ICD-10-CM | POA: Diagnosis not present

## 2018-01-02 DIAGNOSIS — N390 Urinary tract infection, site not specified: Secondary | ICD-10-CM | POA: Diagnosis not present

## 2018-01-02 DIAGNOSIS — Z79899 Other long term (current) drug therapy: Secondary | ICD-10-CM | POA: Diagnosis not present

## 2018-01-03 DIAGNOSIS — E875 Hyperkalemia: Secondary | ICD-10-CM | POA: Diagnosis not present

## 2018-01-03 DIAGNOSIS — R569 Unspecified convulsions: Secondary | ICD-10-CM | POA: Diagnosis not present

## 2018-01-05 DIAGNOSIS — F70 Mild intellectual disabilities: Secondary | ICD-10-CM | POA: Diagnosis not present

## 2018-01-05 DIAGNOSIS — Z23 Encounter for immunization: Secondary | ICD-10-CM | POA: Diagnosis not present

## 2018-01-31 DIAGNOSIS — F79 Unspecified intellectual disabilities: Secondary | ICD-10-CM | POA: Diagnosis not present

## 2018-01-31 DIAGNOSIS — E669 Obesity, unspecified: Secondary | ICD-10-CM | POA: Diagnosis not present

## 2018-01-31 DIAGNOSIS — R609 Edema, unspecified: Secondary | ICD-10-CM | POA: Diagnosis not present

## 2018-01-31 DIAGNOSIS — G40909 Epilepsy, unspecified, not intractable, without status epilepticus: Secondary | ICD-10-CM | POA: Diagnosis not present

## 2018-02-01 DIAGNOSIS — Z79899 Other long term (current) drug therapy: Secondary | ICD-10-CM | POA: Diagnosis not present

## 2018-02-07 DIAGNOSIS — Z5181 Encounter for therapeutic drug level monitoring: Secondary | ICD-10-CM | POA: Diagnosis not present

## 2018-02-07 DIAGNOSIS — E875 Hyperkalemia: Secondary | ICD-10-CM | POA: Diagnosis not present

## 2018-02-13 DIAGNOSIS — E875 Hyperkalemia: Secondary | ICD-10-CM | POA: Diagnosis not present

## 2018-02-13 DIAGNOSIS — Z5181 Encounter for therapeutic drug level monitoring: Secondary | ICD-10-CM | POA: Diagnosis not present

## 2018-02-17 DIAGNOSIS — R609 Edema, unspecified: Secondary | ICD-10-CM | POA: Diagnosis not present

## 2018-02-17 DIAGNOSIS — R569 Unspecified convulsions: Secondary | ICD-10-CM | POA: Diagnosis not present

## 2018-02-17 DIAGNOSIS — E669 Obesity, unspecified: Secondary | ICD-10-CM | POA: Diagnosis not present

## 2018-02-17 DIAGNOSIS — F79 Unspecified intellectual disabilities: Secondary | ICD-10-CM | POA: Diagnosis not present

## 2018-02-21 DIAGNOSIS — E875 Hyperkalemia: Secondary | ICD-10-CM | POA: Diagnosis not present

## 2018-02-21 DIAGNOSIS — Z5181 Encounter for therapeutic drug level monitoring: Secondary | ICD-10-CM | POA: Diagnosis not present

## 2018-02-28 DIAGNOSIS — E875 Hyperkalemia: Secondary | ICD-10-CM | POA: Diagnosis not present

## 2018-02-28 DIAGNOSIS — Z5181 Encounter for therapeutic drug level monitoring: Secondary | ICD-10-CM | POA: Diagnosis not present

## 2018-03-07 DIAGNOSIS — E875 Hyperkalemia: Secondary | ICD-10-CM | POA: Diagnosis not present

## 2018-03-13 DIAGNOSIS — E875 Hyperkalemia: Secondary | ICD-10-CM | POA: Diagnosis not present

## 2018-03-13 DIAGNOSIS — Z5181 Encounter for therapeutic drug level monitoring: Secondary | ICD-10-CM | POA: Diagnosis not present

## 2018-03-13 DIAGNOSIS — G4089 Other seizures: Secondary | ICD-10-CM | POA: Diagnosis not present

## 2018-03-13 DIAGNOSIS — R569 Unspecified convulsions: Secondary | ICD-10-CM | POA: Diagnosis not present

## 2018-03-20 DIAGNOSIS — G4089 Other seizures: Secondary | ICD-10-CM | POA: Diagnosis not present

## 2018-03-20 DIAGNOSIS — E875 Hyperkalemia: Secondary | ICD-10-CM | POA: Diagnosis not present

## 2018-03-20 DIAGNOSIS — Z5181 Encounter for therapeutic drug level monitoring: Secondary | ICD-10-CM | POA: Diagnosis not present

## 2018-03-27 DIAGNOSIS — E875 Hyperkalemia: Secondary | ICD-10-CM | POA: Diagnosis not present

## 2018-03-27 DIAGNOSIS — G4089 Other seizures: Secondary | ICD-10-CM | POA: Diagnosis not present

## 2018-03-27 DIAGNOSIS — Z5181 Encounter for therapeutic drug level monitoring: Secondary | ICD-10-CM | POA: Diagnosis not present

## 2018-04-03 DIAGNOSIS — G4089 Other seizures: Secondary | ICD-10-CM | POA: Diagnosis not present

## 2018-04-03 DIAGNOSIS — E875 Hyperkalemia: Secondary | ICD-10-CM | POA: Diagnosis not present

## 2018-04-03 DIAGNOSIS — Z5181 Encounter for therapeutic drug level monitoring: Secondary | ICD-10-CM | POA: Diagnosis not present

## 2018-04-13 DIAGNOSIS — G4089 Other seizures: Secondary | ICD-10-CM | POA: Diagnosis not present

## 2018-04-13 DIAGNOSIS — Z5181 Encounter for therapeutic drug level monitoring: Secondary | ICD-10-CM | POA: Diagnosis not present

## 2018-04-13 DIAGNOSIS — E875 Hyperkalemia: Secondary | ICD-10-CM | POA: Diagnosis not present

## 2018-04-17 DIAGNOSIS — E669 Obesity, unspecified: Secondary | ICD-10-CM | POA: Diagnosis not present

## 2018-04-17 DIAGNOSIS — R609 Edema, unspecified: Secondary | ICD-10-CM | POA: Diagnosis not present

## 2018-04-17 DIAGNOSIS — F79 Unspecified intellectual disabilities: Secondary | ICD-10-CM | POA: Diagnosis not present

## 2018-04-17 DIAGNOSIS — G40909 Epilepsy, unspecified, not intractable, without status epilepticus: Secondary | ICD-10-CM | POA: Diagnosis not present

## 2018-04-17 DIAGNOSIS — R1310 Dysphagia, unspecified: Secondary | ICD-10-CM | POA: Diagnosis not present

## 2018-04-20 DIAGNOSIS — R569 Unspecified convulsions: Secondary | ICD-10-CM | POA: Diagnosis not present

## 2018-04-20 DIAGNOSIS — G4089 Other seizures: Secondary | ICD-10-CM | POA: Diagnosis not present

## 2018-04-28 DIAGNOSIS — E875 Hyperkalemia: Secondary | ICD-10-CM | POA: Diagnosis not present

## 2018-05-04 DIAGNOSIS — E875 Hyperkalemia: Secondary | ICD-10-CM | POA: Diagnosis not present

## 2018-05-11 DIAGNOSIS — E875 Hyperkalemia: Secondary | ICD-10-CM | POA: Diagnosis not present

## 2018-05-18 DIAGNOSIS — E875 Hyperkalemia: Secondary | ICD-10-CM | POA: Diagnosis not present

## 2018-05-25 DIAGNOSIS — E875 Hyperkalemia: Secondary | ICD-10-CM | POA: Diagnosis not present

## 2018-05-26 DIAGNOSIS — E669 Obesity, unspecified: Secondary | ICD-10-CM | POA: Diagnosis not present

## 2018-05-26 DIAGNOSIS — G4089 Other seizures: Secondary | ICD-10-CM | POA: Diagnosis not present

## 2018-05-26 DIAGNOSIS — D649 Anemia, unspecified: Secondary | ICD-10-CM | POA: Diagnosis not present

## 2018-05-26 DIAGNOSIS — E039 Hypothyroidism, unspecified: Secondary | ICD-10-CM | POA: Diagnosis not present

## 2018-05-26 DIAGNOSIS — R1310 Dysphagia, unspecified: Secondary | ICD-10-CM | POA: Diagnosis not present

## 2018-05-26 DIAGNOSIS — M6281 Muscle weakness (generalized): Secondary | ICD-10-CM | POA: Diagnosis not present

## 2018-05-26 DIAGNOSIS — R609 Edema, unspecified: Secondary | ICD-10-CM | POA: Diagnosis not present

## 2018-05-26 DIAGNOSIS — E1151 Type 2 diabetes mellitus with diabetic peripheral angiopathy without gangrene: Secondary | ICD-10-CM | POA: Diagnosis not present

## 2018-05-26 DIAGNOSIS — G40909 Epilepsy, unspecified, not intractable, without status epilepticus: Secondary | ICD-10-CM | POA: Diagnosis not present

## 2018-05-26 DIAGNOSIS — I10 Essential (primary) hypertension: Secondary | ICD-10-CM | POA: Diagnosis not present

## 2018-05-26 DIAGNOSIS — F79 Unspecified intellectual disabilities: Secondary | ICD-10-CM | POA: Diagnosis not present

## 2018-05-26 DIAGNOSIS — E875 Hyperkalemia: Secondary | ICD-10-CM | POA: Diagnosis not present

## 2018-06-01 DIAGNOSIS — E875 Hyperkalemia: Secondary | ICD-10-CM | POA: Diagnosis not present

## 2018-06-06 DIAGNOSIS — F70 Mild intellectual disabilities: Secondary | ICD-10-CM | POA: Diagnosis not present

## 2018-06-20 DIAGNOSIS — F70 Mild intellectual disabilities: Secondary | ICD-10-CM | POA: Diagnosis not present

## 2018-07-05 DIAGNOSIS — F79 Unspecified intellectual disabilities: Secondary | ICD-10-CM | POA: Diagnosis not present

## 2018-07-05 DIAGNOSIS — G40909 Epilepsy, unspecified, not intractable, without status epilepticus: Secondary | ICD-10-CM | POA: Diagnosis not present

## 2018-07-05 DIAGNOSIS — E669 Obesity, unspecified: Secondary | ICD-10-CM | POA: Diagnosis not present

## 2018-07-05 DIAGNOSIS — R609 Edema, unspecified: Secondary | ICD-10-CM | POA: Diagnosis not present

## 2018-07-05 DIAGNOSIS — E875 Hyperkalemia: Secondary | ICD-10-CM | POA: Diagnosis not present

## 2018-07-20 DIAGNOSIS — F70 Mild intellectual disabilities: Secondary | ICD-10-CM | POA: Diagnosis not present

## 2018-07-27 DIAGNOSIS — I10 Essential (primary) hypertension: Secondary | ICD-10-CM | POA: Diagnosis not present

## 2018-07-27 DIAGNOSIS — E1151 Type 2 diabetes mellitus with diabetic peripheral angiopathy without gangrene: Secondary | ICD-10-CM | POA: Diagnosis not present

## 2018-07-27 DIAGNOSIS — E875 Hyperkalemia: Secondary | ICD-10-CM | POA: Diagnosis not present

## 2018-07-27 DIAGNOSIS — G4089 Other seizures: Secondary | ICD-10-CM | POA: Diagnosis not present

## 2018-08-10 DIAGNOSIS — F70 Mild intellectual disabilities: Secondary | ICD-10-CM | POA: Diagnosis not present

## 2018-08-17 DIAGNOSIS — F70 Mild intellectual disabilities: Secondary | ICD-10-CM | POA: Diagnosis not present

## 2018-08-23 DIAGNOSIS — R569 Unspecified convulsions: Secondary | ICD-10-CM | POA: Diagnosis not present

## 2018-08-23 DIAGNOSIS — E669 Obesity, unspecified: Secondary | ICD-10-CM | POA: Diagnosis not present

## 2018-08-23 DIAGNOSIS — F79 Unspecified intellectual disabilities: Secondary | ICD-10-CM | POA: Diagnosis not present

## 2018-08-23 DIAGNOSIS — R609 Edema, unspecified: Secondary | ICD-10-CM | POA: Diagnosis not present

## 2018-08-31 DIAGNOSIS — E875 Hyperkalemia: Secondary | ICD-10-CM | POA: Diagnosis not present

## 2018-09-20 DIAGNOSIS — F79 Unspecified intellectual disabilities: Secondary | ICD-10-CM | POA: Diagnosis not present

## 2018-09-20 DIAGNOSIS — R609 Edema, unspecified: Secondary | ICD-10-CM | POA: Diagnosis not present

## 2018-09-20 DIAGNOSIS — G40909 Epilepsy, unspecified, not intractable, without status epilepticus: Secondary | ICD-10-CM | POA: Diagnosis not present

## 2018-09-20 DIAGNOSIS — R1310 Dysphagia, unspecified: Secondary | ICD-10-CM | POA: Diagnosis not present

## 2018-09-20 DIAGNOSIS — E669 Obesity, unspecified: Secondary | ICD-10-CM | POA: Diagnosis not present

## 2018-09-21 DIAGNOSIS — E875 Hyperkalemia: Secondary | ICD-10-CM | POA: Diagnosis not present

## 2018-09-26 DIAGNOSIS — R262 Difficulty in walking, not elsewhere classified: Secondary | ICD-10-CM | POA: Diagnosis not present

## 2018-09-26 DIAGNOSIS — F70 Mild intellectual disabilities: Secondary | ICD-10-CM | POA: Diagnosis not present

## 2018-09-26 DIAGNOSIS — M6281 Muscle weakness (generalized): Secondary | ICD-10-CM | POA: Diagnosis not present

## 2018-09-26 DIAGNOSIS — Z9181 History of falling: Secondary | ICD-10-CM | POA: Diagnosis not present

## 2018-10-05 DIAGNOSIS — D649 Anemia, unspecified: Secondary | ICD-10-CM | POA: Diagnosis not present

## 2018-10-05 DIAGNOSIS — G4089 Other seizures: Secondary | ICD-10-CM | POA: Diagnosis not present

## 2018-10-05 DIAGNOSIS — R569 Unspecified convulsions: Secondary | ICD-10-CM | POA: Diagnosis not present

## 2018-10-06 ENCOUNTER — Encounter: Payer: Self-pay | Admitting: Nurse Practitioner

## 2018-10-06 ENCOUNTER — Non-Acute Institutional Stay: Payer: Medicare Other | Admitting: Nurse Practitioner

## 2018-10-06 ENCOUNTER — Other Ambulatory Visit: Payer: Self-pay

## 2018-10-06 VITALS — BP 112/78 | HR 92 | Temp 98.3°F | Resp 18 | Ht 67.0 in | Wt 305.5 lb

## 2018-10-06 DIAGNOSIS — Z515 Encounter for palliative care: Secondary | ICD-10-CM | POA: Insufficient documentation

## 2018-10-06 DIAGNOSIS — R5381 Other malaise: Secondary | ICD-10-CM | POA: Insufficient documentation

## 2018-10-06 NOTE — Progress Notes (Signed)
Therap[facility], [facility] Consult Note Telephone: [phone]  Fax: [phone]  PATIENT NAME: Mariah EhlersBarbara Jimenez DOB: 03/24/1946 MRN: [id number]  PRIMARY CARE PROVIDER:   Dr Kathleen LimeHodges REFERRING PROVIDER:  [facility] RESPONSIBLE PARTY:   Mariah Jimenez sister I was asked to see Mariah. [facility] by Dr Katrinka BlazingSmith for [facility]  RECOMMENDATIONS and PLAN:  1.Palliative care encounter Z51.5; Palliative medicine team will continue to support patient, patient's family, and medical team. Visit consisted of counseling and education dealing with the complex and emotionally intense issues of symptom management and palliative care in the setting of serious and potentially life-threatening illness  2. Edema R60.9 secondary to morbid obesity, monitor weights, elevate  3. Debility R53.81 secondary to morbid obesity, encourage passive rom; encourage to transfer to geri-chair.   ASSESSMENT:     I visited and observed Mariah Jimenez. Explain purpose for palliative care visit though with cognitive impairment limited understanding. She did make eye contact with verbal cues and answer simple questions. She denied symptoms of pain or shortness of breath. We talked about getting out of bed as she would require a Hoyer lift. We talked about food and she shared she is not currently hungry. She was cooperative with assessment. Emotional support provided. She currently is a full code and message left for her sister for further discussion of code status and goals of care. I updated nursing staff will continue current plan until further discussion with her sister. Mariah Jimenez does continue to remain stable at present time.  I called Mariah Jimenez, Mariah Jimenez sister who is her health care power of attorney. We talked about purpose her palliative care visit and Mariah Jimenez in agreement, gave her both consent for palliative care to follow. We talked about past medical history as Mariah Jimenez  is familiar to me from previous employer and followed under Primary Care. We talked about transition from home when her sister Mariah Jimenez who is her health care power-of-attorney past, Mariah Jimenez transition to [facility]. We talked about the challenges with Skilled Nursing Facility. We talked about the limitations with covid- 19 with visiting policy. We talked about palliative care visit today. We talked about Mariah Jimenez is cognitive impairment. We talked about her functional level as she currently is bed bound, requires to be turned. Clinical update discussed. We talked about symptoms, appetite. We talked about weight slowly increasing and dietitian working with hopes of weight reduction. We talked about chronic edema. We talked about medical goals of care including aggressive versus conservative vs Comfort Care. Previously Mariah Jimenez was a DNR. When she came to the facility she was switch to full code. Discussed at length with Mariah Jimenez and verbalize wishes are for a DNR. Mariah Jimenez in agreement to change Mariah Jimenez to a DNR. At a facility form completed [place] and orders given. We talked about role of palliative care and plan of care. We talked about follow up visit in 4 weeks if needed or sooner should she declined. Mariah Jimenez in agreement.  I spent 75 minutes providing this consultation,  from 9:00am to 10:15am. More than 50% of the time in this consultation was spent coordinating communication.   HISTORY OF PRESENT ILLNESS:  Mariah EhlersBarbara Hunsucker is a 73 y.o. year old female with multiple medical problems including[age] year old female residing [facility]. She is functioning quadriplegic, total ADL dependents with incontinence bowel and  bladder. She now does required to be fed. She is on a regular diet, regular liquid, regular liquid consistency. She does have generalized edema. She does have multiple medical problems including mental  retardation, intellectual disability, seizure disorder, chronic edema, morbid obesity. She is a full code. She is Fall by psychiatrist last date of service for / 9 / 2020 and currently not prescribed psychotropic medications but is prescribed Keppra, phenobarbital, Dilantin for seizure disorder. No medication changes at this visit. Staff endorses she is unable to verbalize hurt and needs though able to answer simple questions at times. At present she is lying in bed. She is obese, makes eye contact done appears comfortable. No visitors present. Palliative Care was asked to help address goals of care.   CODE STATUS: DNR  PPS: 30% HOSPICE ELIGIBILITY/DIAGNOSIS: TBD  PAST MEDICAL HISTORY:  Past Medical History:  Diagnosis Date  . Mental retardation   . Seizures ([place])     SOCIAL HX:  Social History   Tobacco Use  . Smoking status: Never Smoker  . Smokeless tobacco: Never Used  Substance Use Topics  . Alcohol use: No    ALLERGIES: No Known Allergies   PERTINENT MEDICATIONS:  Outpatient Encounter Medications as of 10/06/2018  Medication Sig  . acetaminophen (TYLENOL) 325 MG tablet Take 650 mg by mouth every 6 (six) hours as needed.  . levETIRAcetam (KEPPRA) 500 MG tablet Take 1,500 mg by mouth 2 (two) times daily.  Marland Kitchen loratadine (CLARITIN) 10 MG tablet Take 10 mg by mouth daily.  Marland Kitchen PHENObarbital (LUMINAL) 30 MG tablet Take 30 mg by mouth at bedtime.  . phenytoin (DILANTIN) 100 MG ER capsule Take 300 mg by mouth at bedtime.  . Triamcinolone Acetonide (TRIAMCINOLONE 0.1 % CREAM : EUCERIN) CREA Apply 1 application topically 3 (three) times daily as needed.  . [DISCONTINUED] cephALEXin (KEFLEX) 500 MG capsule Take 1 capsule (500 mg total) by mouth 3 (three) times daily. (Patient not taking: Reported on 10/06/2018)  . [DISCONTINUED] docusate sodium (COLACE) 100 MG capsule Take 1 capsule (100 mg total) by mouth 2 (two) times daily. (Patient not taking: Reported on 10/06/2018)  . [DISCONTINUED]  enoxaparin (LOVENOX) 40 MG/0.4ML injection Inject 0.4 mLs (40 mg total) into the skin daily.  . [DISCONTINUED] oxyCODONE (OXY IR/ROXICODONE) 5 MG immediate release tablet Take 1 tablet (5 mg total) by mouth every 4 (four) hours as needed for breakthrough pain. (Patient not taking: Reported on 10/06/2018)   No facility-administered encounter medications on file as of 10/06/2018.     PHYSICAL EXAM:   General: NAD, obese, pleasant female, cognitive impairment Cardiovascular: regular rate and rhythm Pulmonary: clear ant fields Abdomen: obese, soft, nontender, + bowel sounds GU: no suprapubic tenderness Extremities: + edema, no joint deformities Skin: no rashes Neurological: functional quadriplegic  Racine Erby Ihor Gully, NP

## 2018-10-09 ENCOUNTER — Other Ambulatory Visit: Payer: Self-pay

## 2018-10-17 DIAGNOSIS — F70 Mild intellectual disabilities: Secondary | ICD-10-CM | POA: Diagnosis not present

## 2018-10-17 DIAGNOSIS — R262 Difficulty in walking, not elsewhere classified: Secondary | ICD-10-CM | POA: Diagnosis not present

## 2018-10-17 DIAGNOSIS — M6281 Muscle weakness (generalized): Secondary | ICD-10-CM | POA: Diagnosis not present

## 2018-10-19 DIAGNOSIS — D649 Anemia, unspecified: Secondary | ICD-10-CM | POA: Diagnosis not present

## 2018-10-19 DIAGNOSIS — G4089 Other seizures: Secondary | ICD-10-CM | POA: Diagnosis not present

## 2018-10-19 DIAGNOSIS — E875 Hyperkalemia: Secondary | ICD-10-CM | POA: Diagnosis not present

## 2018-11-17 ENCOUNTER — Non-Acute Institutional Stay: Payer: Medicare Other | Admitting: Nurse Practitioner

## 2018-11-17 VITALS — BP 99/53 | HR 88 | Temp 97.0°F | Resp 18 | Wt 304.5 lb

## 2018-11-17 DIAGNOSIS — Z20828 Contact with and (suspected) exposure to other viral communicable diseases: Secondary | ICD-10-CM | POA: Diagnosis not present

## 2018-11-17 DIAGNOSIS — F70 Mild intellectual disabilities: Secondary | ICD-10-CM | POA: Diagnosis not present

## 2018-11-17 DIAGNOSIS — R569 Unspecified convulsions: Secondary | ICD-10-CM | POA: Diagnosis not present

## 2018-11-17 DIAGNOSIS — Z9181 History of falling: Secondary | ICD-10-CM | POA: Diagnosis not present

## 2018-11-17 DIAGNOSIS — Z515 Encounter for palliative care: Secondary | ICD-10-CM | POA: Diagnosis not present

## 2018-11-17 DIAGNOSIS — R262 Difficulty in walking, not elsewhere classified: Secondary | ICD-10-CM | POA: Diagnosis not present

## 2018-11-17 NOTE — Progress Notes (Signed)
Therap[facility], [facility] Consult Note Telephone: [phone]  Fax: [phone](336) [phone]  PATIENT NAME: Mariah EhlersBarbara Jimenez DOB: 03/24/1946 MRN: [id number]  PRIMARY CARE PROVIDER:   Dr Kathleen LimeHodges REFERRING PROVIDER:  Dr Hodges/[facility] RESPONSIBLE PARTY:   Annamarie Majoruby Mondry sister  RECOMMENDATIONS and PLAN:  1.ACP: DNR; focus on comfort care  2. Edema R60.9 secondary to morbid obesity, monitor weights, elevate  3. Debility R53.81 secondary to morbid obesity, encourage passive rom; encourage to transfer to geri-chair.   4. Palliative care encounter Z51.5; Palliative medicine team will continue to support patient, patient's family, and medical team. Visit consisted of counseling and education dealing with the complex and emotionally intense issues of symptom management and palliative care in the setting of serious and potentially life-threatening illness  I spent 30 minutes providing this consultation,  from 8:00am to 8:30am. More than 50% of the time in this consultation was spent coordinating communication.   HISTORY OF PRESENT ILLNESS:  Mariah EhlersBarbara Kersten is a 73 y.o. year old female with multiple medical problems including mental retardation, intellectual disability, seizure disorder, chronic edema, morbid obesity. Ms Mariah Jimenez continues to reside at Skilled [facility] Care Nursing Facility [facility]. Palliative care 2 follow up for monitoring of weight, appetite, overall decline in the setting of chronic disease. She does require assistance with positioning in bed due to severe obesity. She has had a fall 7 / 9 /2020 no noted injury. She does require assistance with ADLs and incontinence bowel and bladder. She does feed herself after tray set up and often drops food in her lap. Appetite is been fair as she is gaining weight. She has had some increase in edema. No recent seizure activity.  No recent hospitalizations, wounds, infections. She will  participate in activities on a one-to-one basis if brought to her with limited due to mental retardation. She is interactive and able to express her needs so difficult to understand at times. At present she is lying in bed, appears comfortable, obese. No visitors present. I visited and observed Ms Mariah Jimenez. She was lying in bed, made eye contact. She was cooperative with assessment. She replied no when asked if she was in pain. Asked if she was hungry and she replied no. Limited verbal discussion with cognitive impairment. Ms Mariah Jimenez says continue to remain stable. Will continue to Monitor and follow Ms Mariah Jimenez for chronic disease, decline. Updated nursing staff. No new changes to current goals are plan of care. I attempted to contact Palliative Care was asked to help to continue to address goals of care.   CODE STATUS: DNR  PPS: 30% HOSPICE ELIGIBILITY/DIAGNOSIS: TBD  PAST MEDICAL HISTORY:  Past Medical History:  Diagnosis Date  . Mental retardation   . Seizures ([facility])     SOCIAL HX:  Social History   Tobacco Use  . Smoking status: Never Smoker  . Smokeless tobacco: Never Used  Substance Use Topics  . Alcohol use: No    ALLERGIES: No Known Allergies   PERTINENT MEDICATIONS:  Outpatient Encounter Medications as of 11/17/2018  Medication Sig  . acetaminophen (TYLENOL) 325 MG tablet Take 650 mg by mouth every 6 (six) hours as needed.  . levETIRAcetam (KEPPRA) 500 MG tablet Take 1,500 mg by mouth 2 (two) times daily.  Marland Kitchen. loratadine (CLARITIN) 10 MG tablet Take 10 mg by mouth daily.  Marland Kitchen. PHENObarbital (LUMINAL) 30 MG tablet Take 30 mg by mouth at bedtime.  . phenytoin (DILANTIN) 100 MG ER capsule Take 300  mg by mouth at bedtime.  . Triamcinolone Acetonide (TRIAMCINOLONE 0.1 % CREAM : EUCERIN) CREA Apply 1 application topically 3 (three) times daily as needed.   No facility-administered encounter medications on file as of 11/17/2018.     PHYSICAL EXAM:   General: NAD, obese, bedbound, confused  female Cardiovascular: regular rate and rhythm Pulmonary: clear ant fields Abdomen: soft, nontender, + bowel sounds Extremities: +edema, no joint deformities Neurological: Weakness but otherwise nonfocal/functional quadriplegic   Ihor Gully, NP

## 2018-11-20 ENCOUNTER — Other Ambulatory Visit: Payer: Self-pay

## 2018-12-06 DIAGNOSIS — E875 Hyperkalemia: Secondary | ICD-10-CM | POA: Diagnosis not present

## 2018-12-14 DIAGNOSIS — E875 Hyperkalemia: Secondary | ICD-10-CM | POA: Diagnosis not present

## 2018-12-20 DIAGNOSIS — R569 Unspecified convulsions: Secondary | ICD-10-CM | POA: Diagnosis not present

## 2018-12-21 DIAGNOSIS — E875 Hyperkalemia: Secondary | ICD-10-CM | POA: Diagnosis not present

## 2018-12-22 ENCOUNTER — Non-Acute Institutional Stay: Payer: Medicare Other | Admitting: Nurse Practitioner

## 2018-12-22 ENCOUNTER — Encounter: Payer: Self-pay | Admitting: Nurse Practitioner

## 2018-12-22 VITALS — BP 126/53 | HR 87 | Temp 98.1°F | Resp 20 | Wt 296.8 lb

## 2018-12-22 DIAGNOSIS — Z515 Encounter for palliative care: Secondary | ICD-10-CM | POA: Diagnosis not present

## 2018-12-22 NOTE — Progress Notes (Signed)
Therapist, [facility] Consult Note Telephone: [phone]  Fax: [phone]  PATIENT NAME: Mariah Jimenez DOB: 03/24/1946 MRN: [id number]  PRIMARY CARE PROVIDER:   Dr Hodges/[facility] RESPONSIBLE PARTY:   Annamarie Majoruby Giovannetti sister  RECOMMENDATIONS and PLAN: 1.ACP: DNR; focus on comfort care  2.Edema secondary tomorbid obesity, monitor weights, elevate  3.Debility  secondary tomorbid obesity, encourage passive rom; encourage to transfer to geri-chair.  4. Palliative care encounter; Palliative medicine team will continue to support patient, patient's family, and medical team. Visit consisted of counseling and education dealing with the complex and emotionally intense issues of symptom management and palliative care in the setting of serious and potentially life-threatening illness I spent 45 minutes providing this consultation,  from 1:00pm to 1:45pm. More than 50% of the time in this consultation was spent coordinating communication.   HISTORY OF PRESENT ILLNESS:  Mariah EhlersBarbara Manner is a 73 y.o. year old female with multiple medical problems including mental retardation, intellectual disability, seizure disorder, chronic edema, morbid obesity. Mariah. Tiburcio Jimenez continues to reside in Skilled Long-Term Care Nursing Facility at [facility]. She runs a bed-bound, functioning quadriplegic lift to a [employer] Chair. She is total ADL dependence with incontinence. She does require assistance with feeding and often staff does have to feed her. She is on a regular diet regular texture, regular liquid consistency. She is oriented to self and at times able to say specific words. She is a DNR. She did have a fall on 7/11 / 2020 with no noted injury. No recent hospitalizations, wounds, infections. Activities to go in to engage with Mariah Jimenez for independent activities several days a week including watching game shows, TV programs, socializing and  simple conversation, in room crafts. Last primary provider Note 8 / 7 / 2020 for routine visit. No recent seizure activity. At present Mariah Jimenez is lying in bed. She is sleeping, appears obese but comfortable. She did arouse to verbal cues. Asked if she was having symptoms of pain or shortness of breath and she nodded no. No verbal responses. She was quite furtive with assessment. No meaningful discussion to do cognitive impairment and she returned to sleep throughout palliative care visit. I have attempted to contact her family, message left for update on palliative care visit. Medical goals continue to focus on DNR and treat what is treatable in place to decrease hospitalizations it is necessary. I updated nursing staff know any changes to current goals or plan of care. Will follow up in two months if needed or sooner should she declined.  9 / 1 / 2020 covid-19 negative  Palliative Care was asked to help to continue to address goals of care.   CODE STATUS: DNR  PPS: 30% HOSPICE ELIGIBILITY/DIAGNOSIS: TBD  PAST MEDICAL HISTORY:  Past Medical History:  Diagnosis Date  . Mental retardation   . Seizures ([facility])     SOCIAL HX:  Social History   Tobacco Use  . Smoking status: Never Smoker  . Smokeless tobacco: Never Used  Substance Use Topics  . Alcohol use: No    ALLERGIES: No Known Allergies   PERTINENT MEDICATIONS:  Outpatient Encounter Medications as of 12/22/2018  Medication Sig  . acetaminophen (TYLENOL) 325 MG tablet Take 650 mg by mouth every 6 (six) hours as needed.  . levETIRAcetam (KEPPRA) 500 MG tablet Take 1,500 mg by mouth 2 (two) times daily.  Marland Kitchen. loratadine (CLARITIN) 10 MG tablet Take 10 mg by mouth daily.  Marland Kitchen. PHENObarbital (  LUMINAL) 30 MG tablet Take 30 mg by mouth at bedtime.  . phenytoin (DILANTIN) 100 MG ER capsule Take 300 mg by mouth at bedtime.  . Triamcinolone Acetonide (TRIAMCINOLONE 0.1 % CREAM : EUCERIN) CREA Apply 1 application topically 3 (three) times daily as  needed.   No facility-administered encounter medications on file as of 12/22/2018.     PHYSICAL EXAM:   General: NAD, severe obesity, debility Cardiovascular: regular rate and rhythm Pulmonary: clear ant fields Abdomen: soft, nontender, + bowel sounds Extremities: BLE edema Neurological: functionally quadriplegic  Christin Ihor Gully, NP

## 2018-12-25 ENCOUNTER — Other Ambulatory Visit: Payer: Self-pay

## 2018-12-28 DIAGNOSIS — E875 Hyperkalemia: Secondary | ICD-10-CM | POA: Diagnosis not present

## 2019-01-04 DIAGNOSIS — E875 Hyperkalemia: Secondary | ICD-10-CM | POA: Diagnosis not present

## 2019-01-11 DIAGNOSIS — E875 Hyperkalemia: Secondary | ICD-10-CM | POA: Diagnosis not present

## 2019-01-11 DIAGNOSIS — R569 Unspecified convulsions: Secondary | ICD-10-CM | POA: Diagnosis not present

## 2019-01-12 DIAGNOSIS — G4089 Other seizures: Secondary | ICD-10-CM | POA: Diagnosis not present

## 2019-01-12 DIAGNOSIS — E875 Hyperkalemia: Secondary | ICD-10-CM | POA: Diagnosis not present

## 2019-01-12 DIAGNOSIS — R569 Unspecified convulsions: Secondary | ICD-10-CM | POA: Diagnosis not present

## 2019-01-18 DIAGNOSIS — E875 Hyperkalemia: Secondary | ICD-10-CM | POA: Diagnosis not present

## 2019-01-31 ENCOUNTER — Encounter: Payer: Self-pay | Admitting: Nurse Practitioner

## 2019-01-31 ENCOUNTER — Non-Acute Institutional Stay: Payer: Medicare Other | Admitting: Nurse Practitioner

## 2019-01-31 VITALS — BP 132/78 | HR 98 | Temp 98.4°F | Resp 18 | Wt 296.5 lb

## 2019-01-31 DIAGNOSIS — Z515 Encounter for palliative care: Secondary | ICD-10-CM

## 2019-01-31 NOTE — Progress Notes (Signed)
[facility] Consult Note Telephone: [phone]  Fax: [phone]  PATIENT NAME: Mariah Jimenez DOB: 09/19/1945 MRN: [id number]  PRIMARY CARE PROVIDER:   Dr Hodges/[facility] RESPONSIBLE PARTY:[name] Espinoza sister  RECOMMENDATIONS and PLAN: 1.ACP: DNR; focus on comfort care  2.Edema secondary tomorbid obesity, monitor weights, elevate  3.Debility  secondary tomorbid obesity, encourage passive rom; encourage to transfer to geri-chair.  4.Palliative care encounter; Palliative medicine team will continue to support patient, patient's family, and medical team. Visit consisted of counseling and education dealing with the complex and emotionally intense issues of symptom management and palliative care in the setting of serious and potentially life-threatening illness  I spent 31 minutes providing this consultation,  from 11:15am to 11:46am. More than 50% of the time in this consultation was spent coordinating communication.   HISTORY OF PRESENT ILLNESS:  Mariah Jimenez is a 73 y.o. year old female with multiple medical problems including mental retardation, intellectual disability, seizure disorder, chronic edema, morbid obesity. Ms Snader continues to reside at [place] at [facility]. She is bed bound and requires staff to turn her. She is ADL dependent. She is incontinent bowel and bladder. She does require assistance with feeding an appetite has been fair. She has had a slight cough. She is covid positive. She does continue to have generalized edema per staff. At present she is lying in bed. She appears obese, debilitated but comfortable. No visitors present. I visited and observed Ms. Steines. She does make eye contact with verbal cues and mumbles words. Asked if she was having pain or shortness of breath and she said no. I asked if she was hungry and she said no. She was  cooperative with the assessment. Emotional support provided. At present she does seem stable and will recheck in one week or sooner should she declined. I called her niece Bertram Millard who is her health care power of attorney. We talked about update on palliative care visit and Ms Borunda. We talked about currently she seems to be stable. We talked about medical goals of care with focus on comfort. DNR does remain in place. No changes to current goals or plan of care. Therapeutic listening and emotional support provided. We talked about how difficult it is for Ms Clinkscales to have covid-19 visitors. We talked about coping strategies. Questions answered to satisfaction. I updated nursing staff.  Palliative Care was asked to help to continue to address goals of care.   CODE STATUS: DNR  PPS: 30% HOSPICE ELIGIBILITY/DIAGNOSIS: TBD  PAST MEDICAL HISTORY:  Past Medical History:  Diagnosis Date  . Mental retardation   . Seizures ([place])     SOCIAL HX:  Social History   Tobacco Use  . Smoking status: Never Smoker  . Smokeless tobacco: Never Used  Substance Use Topics  . Alcohol use: No    ALLERGIES: No Known Allergies   PERTINENT MEDICATIONS:  Outpatient Encounter Medications as of 01/31/2019  Medication Sig  . acetaminophen (TYLENOL) 325 MG tablet Take 650 mg by mouth every 6 (six) hours as needed.  . levETIRAcetam (KEPPRA) 500 MG tablet Take 1,500 mg by mouth 2 (two) times daily.  Marland Kitchen loratadine (CLARITIN) 10 MG tablet Take 10 mg by mouth daily.  Marland Kitchen PHENObarbital (LUMINAL) 30 MG tablet Take 30 mg by mouth at bedtime.  . phenytoin (DILANTIN) 100 MG ER capsule Take 300 mg by mouth at bedtime.  . Triamcinolone Acetonide (TRIAMCINOLONE 0.1 % CREAM :  EUCERIN) CREA Apply 1 application topically 3 (three) times daily as needed.   No facility-administered encounter medications on file as of 01/31/2019.     PHYSICAL EXAM:   General: obese, debilitated, chronically ill, oriented to self female  Cardiovascular: regular rate and rhythm Pulmonary: decreased, clear Abdomen: soft, nontender, + bowel sounds Extremities: + edema, no joint deformities Neurological: functionally quadriplegic  Nikki Rusnak Prince [place], NP

## 2019-02-01 ENCOUNTER — Other Ambulatory Visit: Payer: Self-pay

## 2019-02-08 DIAGNOSIS — E875 Hyperkalemia: Secondary | ICD-10-CM | POA: Diagnosis not present

## 2019-02-13 DIAGNOSIS — R569 Unspecified convulsions: Secondary | ICD-10-CM | POA: Diagnosis not present

## 2019-02-17 DIAGNOSIS — E875 Hyperkalemia: Secondary | ICD-10-CM | POA: Diagnosis not present

## 2019-02-28 ENCOUNTER — Non-Acute Institutional Stay: Payer: Medicare Other | Admitting: Nurse Practitioner

## 2019-02-28 ENCOUNTER — Encounter: Payer: Self-pay | Admitting: Nurse Practitioner

## 2019-02-28 VITALS — BP 136/64 | HR 84 | Temp 98.2°F | Resp 16 | Wt 311.2 lb

## 2019-02-28 DIAGNOSIS — Z515 Encounter for palliative care: Secondary | ICD-10-CM | POA: Diagnosis not present

## 2019-02-28 DIAGNOSIS — E875 Hyperkalemia: Secondary | ICD-10-CM | POA: Diagnosis not present

## 2019-02-28 DIAGNOSIS — R569 Unspecified convulsions: Secondary | ICD-10-CM | POA: Diagnosis not present

## 2019-02-28 NOTE — Progress Notes (Signed)
Therapist, nutritional Palliative Care Consult Note Telephone: [phone]  Fax: [phone]  PATIENT NAME: Mariah Jimenez DOB: Nov 14, 1945 MRN: [id number] PRIMARY CARE PROVIDER:Dr Hodges/[facility] RESPONSIBLE PARTY:[name] Arntson sister  RECOMMENDATIONS and PLAN: 1.ACP: DNR; focus on comfort care  2.Edema secondary tomorbid obesity, monitor weights, elevate  3.Debility secondary tomorbid obesity, encourage passive rom; encourage to transfer to geri-chair.  4.Palliative care encounter; Palliative medicine team will continue to support patient, patient's family, and medical team. Visit consisted of counseling and education dealing with the complex and emotionally intense issues of symptom management and palliative care in the setting of serious and potentially life-threatening illness   I spent 35 minutes providing this consultation,  from 12:15pm to 12:50pm. More than 50% of the time in this consultation was spent coordinating communication.   HISTORY OF PRESENT ILLNESS:  Mariah Jimenez is a 73 y.o. year old female with multiple medical problems including mental retardation, intellectual disability, seizure disorder, chronic edema, morbid obesity.  Mariah Jimenez continues to reside in Skilled Long-Term Care Nursing Facility at [name]. She is bed bound, require staff to turn in position her secondary to obesity. She is total ADL dependence. Mariah. Jimenez is incontinent bowel and bladder. Mariah. Jimenez does require assistance with speeding though she is able to feed herself. Appetite has been good. No recent wounds, falls, hospitalizations. DNR does remain in place. She is cognitively impaired and has difficulty at times verbalize her needs. Mariah Jimenez was covid positive, completed or isolation time and remains asymptomatic. At present Mariah Jimenez is lying in bed. She appears obese, debilitated but no distress. I explain purpose of  palliative care visit, Mariah Jimenez making eye contact and nodding. She did make grunting noises attempting to speak though she did have trouble verbalizing words. She was cooperative with assessment. Time spent providing emotional support. Mariah Jimenez has been a long time patient to this provider with last practice and primary care and now following under palliative. Mariah Jimenez continues to remain custodial care, stable at present time. I have attempted to contact her sister for update on palliative care visit. She does remain a DNR. No new changes to current goals or plan of care. I have updated nursing staff  Palliative Care was asked to help to continue to address goals of care.   CODE STATUS: DNR  PPS: 30% HOSPICE ELIGIBILITY/DIAGNOSIS: TBD  PAST MEDICAL HISTORY:  Past Medical History:  Diagnosis Date  . Mental retardation   . Seizures (HCC)     SOCIAL HX:  Social History   Tobacco Use  . Smoking status: Never Smoker  . Smokeless tobacco: Never Used  Substance Use Topics  . Alcohol use: No    ALLERGIES: No Known Allergies   PERTINENT MEDICATIONS:  Outpatient Encounter Medications as of 02/28/2019  Medication Sig  . acetaminophen (TYLENOL) 325 MG tablet Take 650 mg by mouth every 6 (six) hours as needed.  . levETIRAcetam (KEPPRA) 500 MG tablet Take 1,500 mg by mouth 2 (two) times daily.  Marland Kitchen loratadine (CLARITIN) 10 MG tablet Take 10 mg by mouth daily.  Marland Kitchen PHENObarbital (LUMINAL) 30 MG tablet Take 30 mg by mouth at bedtime.  . phenytoin (DILANTIN) 100 MG ER capsule Take 300 mg by mouth at bedtime.  . Triamcinolone Acetonide (TRIAMCINOLONE 0.1 % CREAM : EUCERIN) CREA Apply 1 application topically 3 (three) times daily as needed.   No facility-administered encounter medications on file as of 02/28/2019.     PHYSICAL  EXAM:   General: obese, severely debilitated cognitively impaired female Cardiovascular: regular rate and rhythm Pulmonary: clear ant fields Abdomen: soft, nontender,  + bowel sounds Extremities: +edema, no joint deformities Neurological: functional quadriplegic   Ihor Gully, NP

## 2019-03-01 ENCOUNTER — Other Ambulatory Visit: Payer: Self-pay

## 2019-03-02 ENCOUNTER — Other Ambulatory Visit: Payer: Self-pay

## 2019-03-07 DIAGNOSIS — E875 Hyperkalemia: Secondary | ICD-10-CM | POA: Diagnosis not present

## 2019-03-15 DIAGNOSIS — E875 Hyperkalemia: Secondary | ICD-10-CM | POA: Diagnosis not present

## 2019-03-20 DIAGNOSIS — R569 Unspecified convulsions: Secondary | ICD-10-CM | POA: Diagnosis not present

## 2019-03-22 DIAGNOSIS — E875 Hyperkalemia: Secondary | ICD-10-CM | POA: Diagnosis not present

## 2019-03-29 DIAGNOSIS — E875 Hyperkalemia: Secondary | ICD-10-CM | POA: Diagnosis not present

## 2019-04-05 DIAGNOSIS — E875 Hyperkalemia: Secondary | ICD-10-CM | POA: Diagnosis not present

## 2019-04-12 DIAGNOSIS — E875 Hyperkalemia: Secondary | ICD-10-CM | POA: Diagnosis not present

## 2019-04-19 DIAGNOSIS — E875 Hyperkalemia: Secondary | ICD-10-CM | POA: Diagnosis not present

## 2019-04-23 DIAGNOSIS — Z23 Encounter for immunization: Secondary | ICD-10-CM | POA: Diagnosis not present

## 2019-04-25 ENCOUNTER — Encounter: Payer: Self-pay | Admitting: Nurse Practitioner

## 2019-04-25 ENCOUNTER — Non-Acute Institutional Stay: Payer: Medicare Other | Admitting: Nurse Practitioner

## 2019-04-25 ENCOUNTER — Other Ambulatory Visit: Payer: Self-pay

## 2019-04-25 VITALS — BP 122/56 | HR 78 | Temp 97.7°F | Resp 18 | Wt 301.2 lb

## 2019-04-25 DIAGNOSIS — Z515 Encounter for palliative care: Secondary | ICD-10-CM | POA: Diagnosis not present

## 2019-04-25 DIAGNOSIS — R569 Unspecified convulsions: Secondary | ICD-10-CM

## 2019-04-25 NOTE — Progress Notes (Signed)
Therapist, nutritional Palliative Care Consult Note Telephone: [phone]  Fax: [phone]  PATIENT NAME: Mariah Jimenez DOB: 25-Dec-1945 MRN: [id number] PRIMARY CARE PROVIDER:Dr Mariah Jimenez/Mariah Jimenez RESPONSIBLE PARTY:Mariah Jimenez sister  RECOMMENDATIONS and PLAN: 1.ACP: DNR; focus on comfort care  2.Edema secondary tomorbid obesity, monitor weights, elevate  3.Debility secondary tomorbid obesity, encourage passive rom; encourage to transfer to geri-chair.  4.Palliative care encounter; Palliative medicine team will continue to support patient, patient's family, and medical team. Visit consisted of counseling and education dealing with the complex and emotionally intense issues of symptom management and palliative care in the setting of serious and potentially life-threatening illness  I spent 45 minutes providing this consultation,  from 10:15am to 11:00 am. More than 50% of the time in this consultation was spent coordinating communication.   HISTORY OF Jimenez ILLNESS:  Mariah Jimenez is a 74 y.o. year old female with multiple medical problems including mental retardation, intellectual disability, seizure disorder, chronic edema, morbid obesity.   Ms Mariah Jimenez continues to reside at [facility]. Ms Mariah Jimenez does remain that down secondary to severe obesity. Ms. Mariah Jimenez is total ADL care including repositioning by staff. Ms Mariah Jimenez is incontinent bowel and bladder. She does require to be fed and appetite continues to be fair. Ms Mariah Jimenez does continue to gain weight. Ms Mariah Jimenez also continues to have generalized edema pre staff. Mariah recent Falls, wounds, hospitalizations. DNR is in place. Staff endorses Mariah other changes or concerns. At present Ms Mariah Jimenez is lying in bed. Ms. Mariah Jimenez appears debilitated, obese and comfortable. Ms Mariah Jimenez denies any symptoms of pain. Ms. Mariah Jimenez is oriented to self.  Mariah Jimenez. I visited and observed Ms Mariah Jimenez. I explain purpose for palliative care visit. Ms. Mariah Jimenez was cooperative with assessment. Limited verbal discussion with cognitive impairment. Mariah recent seizure activity. Emotional support provided. I called Ms Mariah Jimenez has niece Mariah Jimenez. Clinical update discussed and talked about palliative care visit. We talked about the increase in weight gain. We talked about the mobility, residing at [facility] and challenges with covet isolation. Mariah endorses that she did come and see Ms Mariah Jimenez through the window. We did talk about overall Ms Mariah Jimenez is stable at Jimenez time will continue to follow monitor weights. Medical goals of care to focus on Comfort, DNR in place with treat what is treatable. Discuss will follow up in two months if needed or sooner should Ms Mariah Jimenez decline. I updated nursing staff know any changes to current goals or plan of care. Palliative Care was asked to help to continue to address goals of care.   CODE STATUS: DNR  PPS: 30% HOSPICE ELIGIBILITY/DIAGNOSIS: TBD  PAST MEDICAL HISTORY:  Past Medical History:  Diagnosis Date  . Mental retardation   . Seizures (HCC)     SOCIAL HX:  Social History   Tobacco Use  . Smoking status: Never Smoker  . Smokeless tobacco: Never Used  Substance Use Topics  . Alcohol use: Mariah    ALLERGIES: Mariah Known Allergies   PERTINENT MEDICATIONS:  Outpatient Encounter Medications as of 04/25/2019  Medication Sig  . acetaminophen (TYLENOL) 325 MG tablet Take 650 mg by mouth every 6 (six) hours as needed.  . levETIRAcetam (KEPPRA) 500 MG tablet Take 1,500 mg by mouth 2 (two) times daily.  Marland Kitchen loratadine (CLARITIN) 10 MG tablet Take 10 mg by mouth daily.  Marland Kitchen PHENObarbital (LUMINAL) 30 MG tablet Take 30 mg  by mouth at bedtime.  . phenytoin (DILANTIN) 100 MG ER capsule Take 300 mg by mouth at bedtime.  . Triamcinolone Acetonide (TRIAMCINOLONE 0.1 % CREAM : EUCERIN)  CREA Apply 1 application topically 3 (three) times daily as needed.   Mariah facility-administered encounter medications on file as of 04/25/2019.    PHYSICAL EXAM:   General: NAD, morbidly obese, debilitated, oriented to self female Cardiovascular: regular rate and rhythm Pulmonary: clear ant fields Abdomen: soft, nontender, + bowel sounds Extremities: + edema, Mariah joint deformities Neurological: functional quadriplegic  Mariah Jimenez Ihor Gully, NP

## 2019-04-30 DIAGNOSIS — E875 Hyperkalemia: Secondary | ICD-10-CM | POA: Diagnosis not present

## 2019-05-07 DIAGNOSIS — E875 Hyperkalemia: Secondary | ICD-10-CM | POA: Diagnosis not present

## 2019-05-15 DIAGNOSIS — F70 Mild intellectual disabilities: Secondary | ICD-10-CM | POA: Diagnosis not present

## 2019-05-15 DIAGNOSIS — R569 Unspecified convulsions: Secondary | ICD-10-CM | POA: Diagnosis not present

## 2019-06-01 ENCOUNTER — Other Ambulatory Visit: Payer: Self-pay

## 2019-06-01 ENCOUNTER — Encounter: Payer: Self-pay | Admitting: Nurse Practitioner

## 2019-06-01 ENCOUNTER — Non-Acute Institutional Stay: Payer: Medicare Other | Admitting: Nurse Practitioner

## 2019-06-01 VITALS — BP 122/56 | HR 80 | Temp 96.2°F | Resp 18 | Wt 301.1 lb

## 2019-06-01 DIAGNOSIS — Z515 Encounter for palliative care: Secondary | ICD-10-CM | POA: Diagnosis not present

## 2019-06-01 DIAGNOSIS — R569 Unspecified convulsions: Secondary | ICD-10-CM

## 2019-06-01 NOTE — Progress Notes (Signed)
    Therapist, nutritional Palliative Care Consult Note Telephone: [phone]  Fax: [phone]  PATIENT NAME: Mariah Jimenez DOB: 06-16-1945 MRN: [id number]  PRIMARY CARE PROVIDER:Dr Hodges/[facility] RESPONSIBLE PARTY:[name] Canul sister  RECOMMENDATIONS and PLAN: 1.ACP: DNR; focus on comfort care  2.Edema secondary tomorbid obesity, monitor weights, elevate  3.Debility secondary tomorbid obesity, encourage passive rom; encourage to transfer to geri-chair.  4.Palliative care encounter; Palliative medicine team will continue to support patient, patient's family, and medical team. Visit consisted of counseling and education dealing with the complex and emotionally intense issues of symptom management and palliative care in the setting of serious and potentially life-threatening illness  I spent 35 minutes providing this consultation,  From 10:00am to 10:35am. More than 50% of the time in this consultation was spent coordinating communication.   HISTORY OF PRESENT ILLNESS:  Mariah Jimenez is a 74 y.o. year old female with multiple medical problems including mental retardation, intellectual disability, seizure disorder, chronic edema, morbid obesity.  [name] continues to reside at [facility] at [facility]. 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 [name] does require staff to turn and position her do difficulty with severe obesity. [name] does require assistance with feeding as appetite has been good. No recent wounds, Falls, infections, hospitalizations. Staff endorses no new concerns. At present [name] is lying in bed. Ms. Mione appears morbidly obese, debilitated with cognitive impairment but comfortable. No visitors present. I visited and observe Ms. Burdick. Explained purpose of palliative care visit. [name] does make eye contact and grunting  noises. [name] was cooperative with assessment. Overall it appears [name] is stable. Will continue to monitor and follow with palliative care. Emotional support provided. Medical goals to continue to focus on comfort with DNR in place. I have attempted to contact [name] for update on palliative care visit. I have updated nursing staff Palliative Care was asked to help to continue to address goals of care.   CODE STATUS: DNR  PPS: 30% HOSPICE ELIGIBILITY/DIAGNOSIS: TBD  PAST MEDICAL HISTORY:  Past Medical History:  Diagnosis Date  . Mental retardation   . Seizures (HCC)     SOCIAL HX:  Social History   Tobacco Use  . Smoking status: Never Smoker  . Smokeless tobacco: Never Used  Substance Use Topics  . Alcohol use: No    ALLERGIES: No Known Allergies   PERTINENT MEDICATIONS:  Outpatient Encounter Medications as of 06/01/2019  Medication Sig  . acetaminophen (TYLENOL) 325 MG tablet Take 650 mg by mouth every 6 (six) hours as needed.  . levETIRAcetam (KEPPRA) 500 MG tablet Take 1,500 mg by mouth 2 (two) times daily.  Marland Kitchen loratadine (CLARITIN) 10 MG tablet Take 10 mg by mouth daily.  Marland Kitchen PHENObarbital (LUMINAL) 30 MG tablet Take 30 mg by mouth at bedtime.  . phenytoin (DILANTIN) 100 MG ER capsule Take 300 mg by mouth at bedtime.  . Triamcinolone Acetonide (TRIAMCINOLONE 0.1 % CREAM : EUCERIN) CREA Apply 1 application topically 3 (three) times daily as needed.   No facility-administered encounter medications on file as of 06/01/2019.    PHYSICAL EXAM:   General: NAD, obese, debilitated female, confused Cardiovascular: regular rate and rhythm Pulmonary: clear ant fields Abdomen:obese,  soft, nontender, + bowel sounds Extremities:  edema, no joint deformities Neurological: functionally quadriplegic  Altagracia Rone Prince [place], NP

## 2019-06-12 DIAGNOSIS — R569 Unspecified convulsions: Secondary | ICD-10-CM | POA: Diagnosis not present

## 2019-06-13 DIAGNOSIS — E875 Hyperkalemia: Secondary | ICD-10-CM | POA: Diagnosis not present

## 2019-06-13 DIAGNOSIS — G4089 Other seizures: Secondary | ICD-10-CM | POA: Diagnosis not present

## 2019-06-13 DIAGNOSIS — Z79899 Other long term (current) drug therapy: Secondary | ICD-10-CM | POA: Diagnosis not present

## 2019-06-18 DIAGNOSIS — Z23 Encounter for immunization: Secondary | ICD-10-CM | POA: Diagnosis not present

## 2019-07-06 ENCOUNTER — Emergency Department: Payer: Medicare Other

## 2019-07-06 ENCOUNTER — Emergency Department
Admission: EM | Admit: 2019-07-06 | Discharge: 2019-07-06 | Disposition: A | Discharge status: Home / Self Care | Payer: Medicare Other | Attending: Emergency Medicine | Admitting: Emergency Medicine

## 2019-07-06 DIAGNOSIS — Z79899 Other long term (current) drug therapy: Secondary | ICD-10-CM | POA: Insufficient documentation

## 2019-07-06 DIAGNOSIS — R0902 Hypoxemia: Secondary | ICD-10-CM | POA: Diagnosis not present

## 2019-07-06 DIAGNOSIS — G40901 Epilepsy, unspecified, not intractable, with status epilepticus: Secondary | ICD-10-CM | POA: Diagnosis not present

## 2019-07-06 DIAGNOSIS — R5381 Other malaise: Secondary | ICD-10-CM | POA: Diagnosis not present

## 2019-07-06 DIAGNOSIS — G40909 Epilepsy, unspecified, not intractable, without status epilepticus: Secondary | ICD-10-CM | POA: Diagnosis not present

## 2019-07-06 DIAGNOSIS — R000 Tachycardia, unspecified: Secondary | ICD-10-CM | POA: Diagnosis not present

## 2019-07-06 DIAGNOSIS — R404 Transient alteration of awareness: Secondary | ICD-10-CM | POA: Diagnosis not present

## 2019-07-06 DIAGNOSIS — R569 Unspecified convulsions: Secondary | ICD-10-CM | POA: Diagnosis not present

## 2019-07-06 DIAGNOSIS — R410 Disorientation, unspecified: Secondary | ICD-10-CM | POA: Diagnosis not present

## 2019-07-06 DIAGNOSIS — R4020 Unspecified coma: Secondary | ICD-10-CM | POA: Diagnosis not present

## 2019-07-06 DIAGNOSIS — N390 Urinary tract infection, site not specified: Secondary | ICD-10-CM | POA: Insufficient documentation

## 2019-07-06 LAB — URINALYSIS, COMPLETE (UACMP) WITH MICROSCOPIC
Bilirubin Urine: NEGATIVE
Glucose, UA: NEGATIVE mg/dL
Ketones, ur: NEGATIVE mg/dL
Nitrite: NEGATIVE
Protein, ur: 100 mg/dL — AB
Specific Gravity, Urine: 1.016 (ref 1.005–1.030)
WBC, UA: >50 WBC/hpf — ABNORMAL HIGH (ref 0–5)
pH: 7 (ref 5.0–8.0)

## 2019-07-06 LAB — CBC WITH DIFFERENTIAL/PLATELET
Abs Immature Granulocytes: 0.07 10*3/uL (ref 0.00–0.07)
Basophils Absolute: 0.1 10*3/uL (ref 0.0–0.1)
Basophils Relative: 1 %
Eosinophils Absolute: 0.2 10*3/uL (ref 0.0–0.5)
Eosinophils Relative: 3 %
HCT: 34.7 % — ABNORMAL LOW (ref 36.0–46.0)
Hemoglobin: 10.9 g/dL — ABNORMAL LOW (ref 12.0–15.0)
Immature Granulocytes: 1 %
Lymphocytes Relative: 19 %
Lymphs Abs: 1.4 10*3/uL (ref 0.7–4.0)
MCH: 26.6 pg (ref 26.0–34.0)
MCHC: 31.4 g/dL (ref 30.0–36.0)
MCV: 84.6 fL (ref 80.0–100.0)
Monocytes Absolute: 0.3 10*3/uL (ref 0.1–1.0)
Monocytes Relative: 5 %
Neutro Abs: 5.3 10*3/uL (ref 1.7–7.7)
Neutrophils Relative %: 71 %
Platelets: 275 10*3/uL (ref 150–400)
RBC: 4.1 MIL/uL (ref 3.87–5.11)
RDW: 15.1 % (ref 11.5–15.5)
WBC: 7.3 10*3/uL (ref 4.0–10.5)
nRBC: 0 % (ref 0.0–0.2)

## 2019-07-06 LAB — COMPREHENSIVE METABOLIC PANEL
ALT: 15 U/L (ref 0–44)
AST: 17 U/L (ref 15–41)
Albumin: 3 g/dL — ABNORMAL LOW (ref 3.5–5.0)
Alkaline Phosphatase: 201 U/L — ABNORMAL HIGH (ref 38–126)
Anion gap: 10 (ref 5–15)
BUN: 14 mg/dL (ref 8–23)
CO2: 24 mmol/L (ref 22–32)
Calcium: 9.2 mg/dL (ref 8.9–10.3)
Chloride: 104 mmol/L (ref 98–111)
Creatinine, Ser: 0.49 mg/dL (ref 0.44–1.00)
GFR calc Af Amer: >60 mL/min (ref >60)
GFR calc non Af Amer: >60 mL/min (ref >60)
Glucose, Bld: 136 mg/dL — ABNORMAL HIGH (ref 70–99)
Potassium: 4.2 mmol/L (ref 3.5–5.1)
Sodium: 138 mmol/L (ref 135–145)
Total Bilirubin: 0.2 mg/dL — ABNORMAL LOW (ref 0.3–1.2)
Total Protein: 8.7 g/dL — ABNORMAL HIGH (ref 6.5–8.1)

## 2019-07-06 LAB — PHENYTOIN LEVEL, TOTAL: Phenytoin Lvl: 7.2 ug/mL — ABNORMAL LOW (ref 10.0–20.0)

## 2019-07-06 MED ORDER — LEVETIRACETAM 500 MG PO TABS
1500.0000 mg | ORAL_TABLET | Freq: Once | ORAL | Status: AC
  Administered 2019-07-06: 1500 mg via ORAL
  Filled 2019-07-06: qty 3

## 2019-07-06 MED ORDER — CEFTRIAXONE SODIUM 1 G IJ SOLR
1.0000 g | Freq: Once | INTRAMUSCULAR | Status: AC
  Administered 2019-07-06: 1 g via INTRAMUSCULAR
  Filled 2019-07-06: qty 10

## 2019-07-06 MED ORDER — SODIUM CHLORIDE 0.9 % IV BOLUS
1000.0000 mL | Freq: Once | INTRAVENOUS | Status: AC
  Administered 2019-07-06: 1000 mL via INTRAVENOUS

## 2019-07-06 MED ORDER — CEPHALEXIN 500 MG PO CAPS: 500.0000 mg | ORAL_CAPSULE | Freq: Three times a day (TID) | ORAL | 0 refills | Status: AC

## 2019-07-06 MED ORDER — PHENYTOIN SODIUM EXTENDED 100 MG PO CAPS
300.0000 mg | ORAL_CAPSULE | Freq: Once | ORAL | Status: AC
  Administered 2019-07-06: 300 mg via ORAL
  Filled 2019-07-06: qty 3

## 2019-07-06 MED ORDER — SODIUM CHLORIDE 0.9 % IV SOLN: 2.0000 g | Freq: Once | INTRAVENOUS | Status: DC

## 2019-07-06 MED ORDER — LIDOCAINE HCL (PF) 1 % IJ SOLN
INTRAMUSCULAR | Status: AC
  Filled 2019-07-06: qty 5

## 2019-07-06 MED ORDER — LORAZEPAM 2 MG/ML IJ SOLN
1.0000 mg | Freq: Once | INTRAMUSCULAR | Status: AC
  Administered 2019-07-06: 1 mg via INTRAVENOUS
  Filled 2019-07-06: qty 1

## 2019-07-06 MED ORDER — SODIUM CHLORIDE 0.9 % IV SOLN
500.0000 mg | Freq: Once | INTRAVENOUS | Status: AC
  Administered 2019-07-06: 500 mg via INTRAVENOUS
  Filled 2019-07-06: qty 10

## 2019-07-06 MED ORDER — PHENOBARBITAL 32.4 MG PO TABS
32.4000 mg | ORAL_TABLET | Freq: Once | ORAL | Status: DC
  Filled 2019-07-06: qty 1

## 2019-07-06 NOTE — ED Notes (Signed)
Cleansed pt of incontinent urine and stool and full bed change.

## 2019-07-06 NOTE — ED Triage Notes (Signed)
Pt from [facility] w/ baseline mentation. Per EMS pt had seizure 20 minutes ago, unwitnessed by EMS. Pt was given 2 mg ativan onsite per EMS.  Pt does not answer questions, is alert.

## 2019-07-06 NOTE — ED Notes (Signed)
cnl  [facility]  Per  Dr  Penne Lash  MD

## 2019-07-06 NOTE — ED Notes (Signed)
Attempted to call report to [facility]-was on hold for 9 minutes with no answer.

## 2019-07-06 NOTE — ED Notes (Signed)
[facility]  CALLED FOR  TRANSPORT  TO  [facility] 

## 2019-07-06 NOTE — ED Notes (Signed)
Pt given warm blanket.

## 2019-07-06 NOTE — ED Provider Notes (Signed)
I was asked to evaluate the patient due to tachycardia. She does appear more tachycardic than usual. Per review of records, she's had recurrent break through seizures in setting of UTIs. She does not appear septic clinically or on lab work. UA checked, will treat with Rocephin IM and outpt Keflex. UCx sent. Otherwise, with fluids, pt HR now 95-low 100s which is her baseline. She was given her night time seizure meds. D/c to facility.    Shaune Pollack, MD 07/06/19 [phone]

## 2019-07-06 NOTE — ED Provider Notes (Signed)
[facility] Emergency Department Provider Note   ____________________________________________   First MD Initiated Contact with Patient 07/06/19 1202     (approximate)  I have reviewed the triage vital signs and the nursing notes.   HISTORY  Chief Complaint Seizures    HPI Ivyrose Hashman is a 74 y.o. female with past medical history of cognitive disability and seizures presents to the ED following seizure. History is limited due to patient's cognitive disability. Per EMS, patient had a witnessed generalized tonic-clonic seizure at [facility] lasting for an unknown duration. At the time of EMS arrival, patient was no longer seizing and seemed to have returned to her baseline mental status.  She is reportedly minimally interactive at baseline due to cognitive disability, is unable to care for self.  Per EMS, patient has been otherwise well recently with no fevers, cough, shortness of breath, or pain.        Past Medical History:  Diagnosis Date  . Mental retardation   . Seizures [facility])     Patient Active Problem List   Diagnosis Date Noted  . Palliative care encounter 10/06/2018  . Debility 10/06/2018  . Tibia/fibula fracture 04/09/2016    Past Surgical History:  Procedure Laterality Date  . NO PAST SURGERIES    . TIBIA IM NAIL INSERTION Right 04/09/2016   Procedure: INTRAMEDULLARY (IM) NAIL TIBIAL;  Surgeon: Corky Mull, MD;  Location: [facility] ORS;  Service: Orthopedics;  Laterality: Right;    Prior to Admission medications   Medication Sig Start Date End Date Taking? Authorizing Provider  acetaminophen (TYLENOL) 325 MG tablet Take 650 mg by mouth every 6 (six) hours as needed.    [provider]  levETIRAcetam (KEPPRA) 500 MG tablet Take 1,500 mg by mouth 2 (two) times daily.    [provider]  loratadine (CLARITIN) 10 MG tablet Take 10 mg by mouth daily.    [provider]  PHENObarbital (LUMINAL) 30 MG  tablet Take 30 mg by mouth at bedtime.    [provider]  phenytoin (DILANTIN) 100 MG ER capsule Take 300 mg by mouth at bedtime.    [provider]  Triamcinolone Acetonide (TRIAMCINOLONE 0.1 % CREAM : EUCERIN) CREA Apply 1 application topically 3 (three) times daily as needed.    [provider]    Allergies Patient has no known allergies.  Family History  Family history unknown: Yes    Social History Social History   Tobacco Use  . Smoking status: Never Smoker  . Smokeless tobacco: Never Used  Substance Use Topics  . Alcohol use: No  . Drug use: No    Review of Systems Unable to obtain due to cognitive deficit. ____________________________________________   PHYSICAL EXAM:  VITAL SIGNS: ED Triage Vitals [07/06/19 1206]  Enc Vitals Group     BP      Pulse Rate (!) 111     Resp 16     Temp 98.6 F (37 C)     Temp Source Oral     SpO2 99 %     Weight      Height      Head Circumference      Peak Flow      Pain Score      Pain Loc      Pain Edu?      Excl. in [place]?     Constitutional: Awake and alert Eyes: Conjunctivae are normal. Head: Atraumatic. Nose: No congestion/rhinnorhea. Mouth/Throat: Mucous membranes are  moist. Neck: Normal ROM Cardiovascular: Tachycardic, regular rhythm. Grossly normal heart sounds. Respiratory: Normal respiratory effort.  No retractions. Lungs CTAB. Gastrointestinal: Soft and nontender. No distention. Genitourinary: deferred Musculoskeletal: No lower extremity tenderness nor edema. Neurologic:  Normal speech and language. No gross focal neurologic deficits are appreciated. Skin:  Skin is warm, dry and intact. No rash noted. Psychiatric: Mood and affect are normal. Speech and behavior are normal.  ____________________________________________   LABS (all labs ordered are listed, but only abnormal results are displayed)  Labs Reviewed  PHENYTOIN LEVEL, TOTAL - Abnormal; Notable for the following  components:      Result Value   Phenytoin Lvl 7.2 (*)    All other components within normal limits  COMPREHENSIVE METABOLIC PANEL - Abnormal; Notable for the following components:   Glucose, Bld 136 (*)    Total Protein 8.7 (*)    Albumin 3.0 (*)    Alkaline Phosphatase 201 (*)    Total Bilirubin 0.2 (*)    All other components within normal limits  CBC WITH DIFFERENTIAL/PLATELET - Abnormal; Notable for the following components:   Hemoglobin 10.9 (*)    HCT 34.7 (*)    All other components within normal limits  CBC WITH DIFFERENTIAL/PLATELET   ____________________________________________  EKG  ED ECG REPORT I, Chesley Noon, the attending physician, personally viewed and interpreted this ECG.   Date: 07/06/2019  EKG Time: 12:36  Rate: 112  Rhythm: sinus tachycardia  Axis: Normal  Intervals:none  ST&T Change: None   PROCEDURES  Procedure(s) performed (including Critical Care):  Procedures   ____________________________________________   INITIAL IMPRESSION / ASSESSMENT AND PLAN / ED COURSE       74 year old female with history of cognitive deficit and seizure disorder presents to the ED for witnessed seizure at [facility].  She arrives in the ED at her baseline mental status with no apparent focal deficits.  EKG shows no evidence of arrhythmia or ischemia, CT head is negative for acute process.  Patient's lab work is significant for subtherapeutic Dilantin level.  Dosing for Dilantin load was discussed with pharmacy, who recommends 500 mg IV.  We will continue to monitor patient here in the ED, if she remains seizure-free she would be appropriate for discharge back to [facility].  Patient received IV dose of Dilantin and remained seizure-free, at her baseline.  She is appropriate for discharge back to nursing facility.      ____________________________________________   FINAL CLINICAL IMPRESSION(S) / ED DIAGNOSES  Final diagnoses:  Seizure  (HCC)  Debility     ED Discharge Orders    None       Note:  This document was prepared using Dragon voice recognition software and may include unintentional dictation errors.   Chesley Noon, MD 07/06/19 [phone]

## 2019-07-06 NOTE — Discharge Instructions (Addendum)
Take the antibiotic as prescribed  Continue usual medications

## 2019-07-08 LAB — URINE CULTURE: Special Requests: NORMAL

## 2019-07-17 DIAGNOSIS — M6281 Muscle weakness (generalized): Secondary | ICD-10-CM | POA: Diagnosis not present

## 2019-07-17 DIAGNOSIS — R569 Unspecified convulsions: Secondary | ICD-10-CM | POA: Diagnosis not present

## 2019-07-18 ENCOUNTER — Non-Acute Institutional Stay: Payer: Medicare Other | Admitting: Nurse Practitioner

## 2019-07-18 ENCOUNTER — Encounter: Payer: Self-pay | Admitting: Nurse Practitioner

## 2019-07-18 ENCOUNTER — Other Ambulatory Visit: Payer: Self-pay

## 2019-07-18 VITALS — BP 127/71 | HR 86 | Temp 97.8°F | Resp 18 | Wt 301.0 lb

## 2019-07-18 DIAGNOSIS — R569 Unspecified convulsions: Secondary | ICD-10-CM

## 2019-07-18 DIAGNOSIS — Z515 Encounter for palliative care: Secondary | ICD-10-CM

## 2019-07-18 NOTE — Progress Notes (Signed)
Therapist, nutritional Palliative Care Consult Note Telephone: [phone]  Fax: [phone]  PATIENT NAME: Mariah Jimenez DOB: 1946-04-10 MRN: [id number]  PRIMARY CARE PROVIDER:Dr Hodges/[facility] RESPONSIBLE PARTY:Mariah Jimenez sister  RECOMMENDATIONS and PLAN: 1.ACP: DNR; focus on comfort care  2.Edema secondary tomorbid obesity, monitor weights, elevate  3.Debility secondary tomorbid obesity, encourage passive rom; encourage to transfer to geri-chair.  4. Seizure disorder; continue Keppra, phenobarbital   5.Palliative care encounter; Palliative medicine team will continue to support patient, patient's family, and medical team. Visit consisted of counseling and education dealing with the complex and emotionally intense issues of symptom management and palliative care in the setting of serious and potentially life-threatening illness  I spent 46 minutes providing this consultation,  from 2:30pm to 3:16pm. More than 50% of the time in this consultation was spent coordinating communication.   HISTORY OF PRESENT ILLNESS:  Mariah Jimenez is a 74 y.o. year old female with multiple medical problems including mental retardation, intellectual disability, seizure disorder, chronic edema, morbid obesity. Ms. Rzasa continues to reside at [facility] at [facility]. Ms. Jupiter is bed bound, total dependence for transferring by lift, mobility. Ms. Gable is able to sit up in a wheelchair if she is placed in one. Ms. Wimer is total ADL dependent with incontinence bowel and bladder. Ms Romberger now require staff to assist her with feeding. Appetite remains good as she does continue to gain weight. 07/06/2019 sent to the emergency department for possible seizure. Returned to facility, not admitted. No recent falls, wounds, infections. Ms. Zanders is cognitively impaired but able to verbalize her needs at  times. At present Ms. Antonetti is sitting up in bed. Ms. Vensel appears comfortable. Asked how Ms. Matlin was feeling and she nodded her head yes. Asked if she was having symptoms of pain and she said no. Limited verbal discussion with cognitive impairment. It is difficult to understand Ms. Bogacz due to enlarged tongue and mumbling. Ms Gaetz does make eye contact with verbal cues. Ms. Blando was cooperative with assessment. Ms. Mcnamara appears to be stable presently. Emotional support provided. I called Secundino Ginger, Ms. Falletta has sister. We talked  about purpose of palliative care visit. Clinical update discussed. We talked about recent visit to the emergency department for seizure activity. Mariah endorses she was not aware of Ms. Thurow being sent out nor at the hospital. [name] endorses no one from the hospital or the facility contacted her. We talked about emergency department visit with CT scan and Labs which appears unremarkable and she was returned to [facility]. We talked about her ongoing functional stability. We talked about her appetite and weight gain. Discuss seizure disorder and that she has been on Keppra and phenobarbital for many years without seizure activity. We talked about chronic disease progression of seizures. We talked about medical goals of care. We talked about role of palliative care and plan of care. Discuss that will follow up in two months if needed or sooner should she declined. Mariah in agreement. Therapeutic listening and emotional support provided. Contact information provided. Questions answered to satisfaction. I updated staff, no new changes to goals or plan of care.   Palliative Care was asked to help to continue to address goals of care.   CODE STATUS: DNR  PPS: 30% HOSPICE ELIGIBILITY/DIAGNOSIS: TBD  PAST MEDICAL HISTORY:  Past Medical History:  Diagnosis Date  . Mental retardation   . Seizures (HCC)  SOCIAL HX:  Social History   Tobacco Use    . Smoking status: Never Smoker  . Smokeless tobacco: Never Used  Substance Use Topics  . Alcohol use: No    ALLERGIES: No Known Allergies   PERTINENT MEDICATIONS:  Outpatient Encounter Medications as of 07/18/2019  Medication Sig  . acetaminophen (TYLENOL) 325 MG tablet Take 650 mg by mouth every 6 (six) hours as needed.  . levETIRAcetam (KEPPRA) 500 MG tablet Take 1,500 mg by mouth 2 (two) times daily.  Marland Kitchen loratadine (CLARITIN) 10 MG tablet Take 10 mg by mouth daily.  Marland Kitchen PHENObarbital (LUMINAL) 30 MG tablet Take 30 mg by mouth at bedtime.  . phenytoin (DILANTIN) 100 MG ER capsule Take 300 mg by mouth at bedtime.  . Triamcinolone Acetonide (TRIAMCINOLONE 0.1 % CREAM : EUCERIN) CREA Apply 1 application topically 3 (three) times daily as needed.   No facility-administered encounter medications on file as of 07/18/2019.    PHYSICAL EXAM:   General: NAD, obese, debilitated, chronically ill, confused female Cardiovascular: regular rate and rhythm Pulmonary: clear ant fields Abdomen: soft, nontender, + bowel sounds Extremities: + edema, no joint deformities Neurological: functionally quadriplegic  Johnathon Mittal Ihor Gully, NP

## 2019-07-19 DIAGNOSIS — E875 Hyperkalemia: Secondary | ICD-10-CM | POA: Diagnosis not present

## 2019-07-26 DIAGNOSIS — E875 Hyperkalemia: Secondary | ICD-10-CM | POA: Diagnosis not present

## 2019-08-02 DIAGNOSIS — E875 Hyperkalemia: Secondary | ICD-10-CM | POA: Diagnosis not present

## 2019-08-18 ENCOUNTER — Inpatient Hospital Stay
Admission: EM | Admit: 2019-08-18 | Discharge: 2019-08-21 | DRG: 100 | Disposition: A | Discharge status: Skilled Nursing Facility | Payer: Medicare Other | Source: Skilled Nursing Facility | Attending: Internal Medicine | Admitting: Internal Medicine

## 2019-08-18 ENCOUNTER — Emergency Department: Payer: Medicare Other

## 2019-08-18 ENCOUNTER — Other Ambulatory Visit: Payer: Self-pay

## 2019-08-18 DIAGNOSIS — T68XXXA Hypothermia, initial encounter: Secondary | ICD-10-CM

## 2019-08-18 DIAGNOSIS — N390 Urinary tract infection, site not specified: Secondary | ICD-10-CM

## 2019-08-18 DIAGNOSIS — F79 Unspecified intellectual disabilities: Secondary | ICD-10-CM | POA: Diagnosis present

## 2019-08-18 DIAGNOSIS — Z79899 Other long term (current) drug therapy: Secondary | ICD-10-CM | POA: Diagnosis not present

## 2019-08-18 DIAGNOSIS — Z6841 Body Mass Index (BMI) 40.0 and over, adult: Secondary | ICD-10-CM | POA: Diagnosis not present

## 2019-08-18 DIAGNOSIS — Z20822 Contact with and (suspected) exposure to covid-19: Secondary | ICD-10-CM | POA: Diagnosis not present

## 2019-08-18 DIAGNOSIS — Z9114 Patient's other noncompliance with medication regimen: Secondary | ICD-10-CM

## 2019-08-18 DIAGNOSIS — J90 Pleural effusion, not elsewhere classified: Secondary | ICD-10-CM | POA: Diagnosis not present

## 2019-08-18 DIAGNOSIS — G9349 Other encephalopathy: Secondary | ICD-10-CM | POA: Diagnosis present

## 2019-08-18 DIAGNOSIS — E662 Morbid (severe) obesity with alveolar hypoventilation: Secondary | ICD-10-CM | POA: Diagnosis not present

## 2019-08-18 DIAGNOSIS — G40409 Other generalized epilepsy and epileptic syndromes, not intractable, without status epilepticus: Secondary | ICD-10-CM | POA: Diagnosis not present

## 2019-08-18 DIAGNOSIS — G40909 Epilepsy, unspecified, not intractable, without status epilepticus: Secondary | ICD-10-CM

## 2019-08-18 DIAGNOSIS — J9601 Acute respiratory failure with hypoxia: Secondary | ICD-10-CM | POA: Diagnosis present

## 2019-08-18 DIAGNOSIS — R680 Hypothermia, not associated with low environmental temperature: Secondary | ICD-10-CM | POA: Diagnosis not present

## 2019-08-18 DIAGNOSIS — Z4682 Encounter for fitting and adjustment of non-vascular catheter: Secondary | ICD-10-CM | POA: Diagnosis not present

## 2019-08-18 DIAGNOSIS — R4182 Altered mental status, unspecified: Secondary | ICD-10-CM | POA: Diagnosis not present

## 2019-08-18 DIAGNOSIS — R1310 Dysphagia, unspecified: Secondary | ICD-10-CM | POA: Diagnosis not present

## 2019-08-18 DIAGNOSIS — R0902 Hypoxemia: Secondary | ICD-10-CM | POA: Diagnosis not present

## 2019-08-18 DIAGNOSIS — N3000 Acute cystitis without hematuria: Secondary | ICD-10-CM | POA: Diagnosis not present

## 2019-08-18 DIAGNOSIS — R4020 Unspecified coma: Secondary | ICD-10-CM | POA: Diagnosis not present

## 2019-08-18 DIAGNOSIS — R404 Transient alteration of awareness: Secondary | ICD-10-CM | POA: Diagnosis not present

## 2019-08-18 DIAGNOSIS — R000 Tachycardia, unspecified: Secondary | ICD-10-CM | POA: Diagnosis not present

## 2019-08-18 DIAGNOSIS — R569 Unspecified convulsions: Secondary | ICD-10-CM

## 2019-08-18 DIAGNOSIS — R0689 Other abnormalities of breathing: Secondary | ICD-10-CM | POA: Diagnosis not present

## 2019-08-18 LAB — CBC
HCT: 36.8 % (ref 36.0–46.0)
Hemoglobin: 11.4 g/dL — ABNORMAL LOW (ref 12.0–15.0)
MCH: 26.8 pg (ref 26.0–34.0)
MCHC: 31 g/dL (ref 30.0–36.0)
MCV: 86.6 fL (ref 80.0–100.0)
Platelets: 305 10*3/uL (ref 150–400)
RBC: 4.25 MIL/uL (ref 3.87–5.11)
RDW: 15.6 % — ABNORMAL HIGH (ref 11.5–15.5)
WBC: 9.2 10*3/uL (ref 4.0–10.5)
nRBC: 0 % (ref 0.0–0.2)

## 2019-08-18 LAB — LACTIC ACID, PLASMA
Lactic Acid, Venous: 1.9 mmol/L (ref 0.5–1.9)
Lactic Acid, Venous: 1 mmol/L (ref 0.5–1.9)

## 2019-08-18 LAB — URINALYSIS, COMPLETE (UACMP) WITH MICROSCOPIC
Bilirubin Urine: NEGATIVE
Glucose, UA: NEGATIVE mg/dL
Hgb urine dipstick: NEGATIVE
Ketones, ur: NEGATIVE mg/dL
Nitrite: NEGATIVE
Protein, ur: 100 mg/dL — AB
Specific Gravity, Urine: 1.012 (ref 1.005–1.030)
WBC, UA: >50 WBC/hpf — ABNORMAL HIGH (ref 0–5)
pH: 9 — ABNORMAL HIGH (ref 5.0–8.0)

## 2019-08-18 LAB — RESPIRATORY PANEL BY RT PCR (FLU A&B, COVID)
Influenza A by PCR: NEGATIVE
Influenza B by PCR: NEGATIVE
SARS Coronavirus 2 by RT PCR: NEGATIVE

## 2019-08-18 LAB — PHENYTOIN LEVEL, TOTAL: Phenytoin Lvl: 10.5 ug/mL (ref 10.0–20.0)

## 2019-08-18 LAB — BASIC METABOLIC PANEL
Anion gap: 10 (ref 5–15)
BUN: 14 mg/dL (ref 8–23)
CO2: 26 mmol/L (ref 22–32)
Calcium: 9.2 mg/dL (ref 8.9–10.3)
Chloride: 102 mmol/L (ref 98–111)
Creatinine, Ser: 0.52 mg/dL (ref 0.44–1.00)
GFR calc Af Amer: >60 mL/min (ref >60)
GFR calc non Af Amer: >60 mL/min (ref >60)
Glucose, Bld: 108 mg/dL — ABNORMAL HIGH (ref 70–99)
Potassium: 4.2 mmol/L (ref 3.5–5.1)
Sodium: 138 mmol/L (ref 135–145)

## 2019-08-18 LAB — MRSA PCR SCREENING: MRSA by PCR: NEGATIVE

## 2019-08-18 LAB — HEPATIC FUNCTION PANEL
ALT: 15 U/L (ref 0–44)
AST: 14 U/L — ABNORMAL LOW (ref 15–41)
Albumin: 2.9 g/dL — ABNORMAL LOW (ref 3.5–5.0)
Alkaline Phosphatase: 225 U/L — ABNORMAL HIGH (ref 38–126)
Bilirubin, Direct: <0.1 mg/dL (ref 0.0–0.2)
Total Bilirubin: 0.3 mg/dL (ref 0.3–1.2)
Total Protein: 8.8 g/dL — ABNORMAL HIGH (ref 6.5–8.1)

## 2019-08-18 LAB — GLUCOSE, CAPILLARY: Glucose-Capillary: 99 mg/dL (ref 70–99)

## 2019-08-18 LAB — PROTIME-INR
INR: 1 (ref 0.8–1.2)
Prothrombin Time: 13.1 seconds (ref 11.4–15.2)

## 2019-08-18 LAB — TROPONIN I (HIGH SENSITIVITY)
Troponin I (High Sensitivity): 3 ng/L (ref <18)
Troponin I (High Sensitivity): 4 ng/L (ref <18)

## 2019-08-18 LAB — BRAIN NATRIURETIC PEPTIDE: B Natriuretic Peptide: 32 pg/mL (ref 0.0–100.0)

## 2019-08-18 IMAGING — CT CT HEAD W/O CM
3 of 4 series · 15 of 47 positions shown, 18 images · non-contrast
Comparison: [DATE]

CLINICAL DATA: Seizure.

EXAM:
CT HEAD WITHOUT CONTRAST
TECHNIQUE: Contiguous axial images were obtained from the base of the skull
through the vertex without intravenous contrast.

[Series 4: head wo · axial · 0.40mm/px · z∈[-193,-36]mm · 9 of 38 slices shown, 12 images]
[im 3/38  brain]
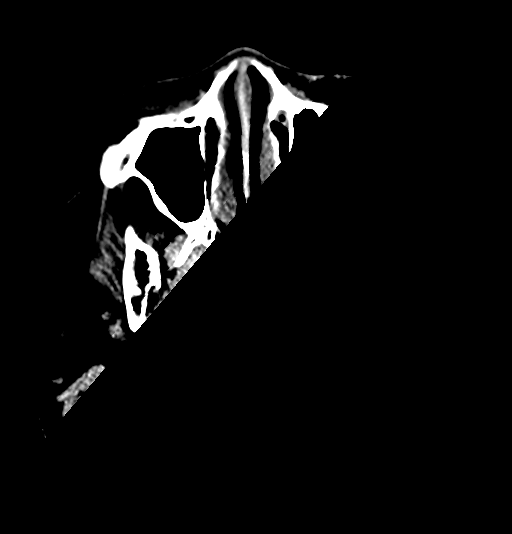
[im 3/38  bone]
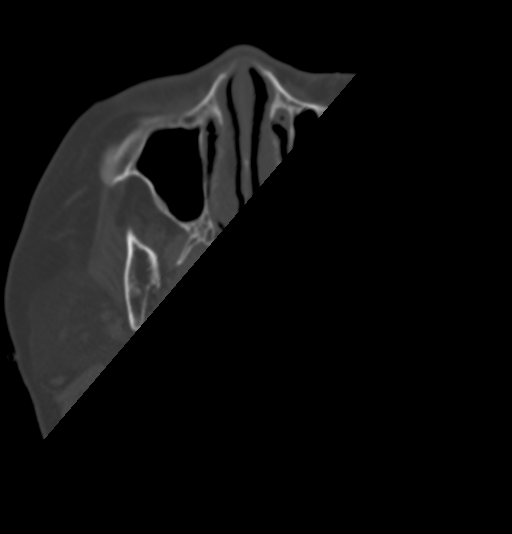
[im 8/38  brain]
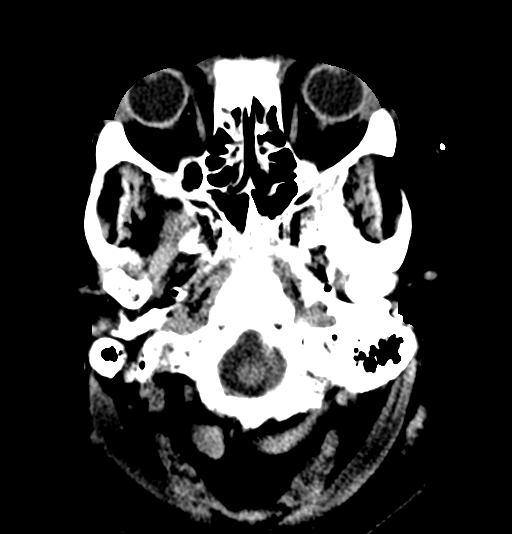
[im 10/38  brain]
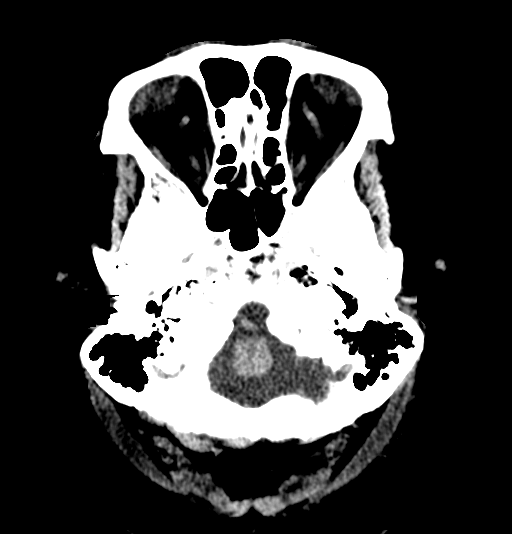
[im 15/38  brain]
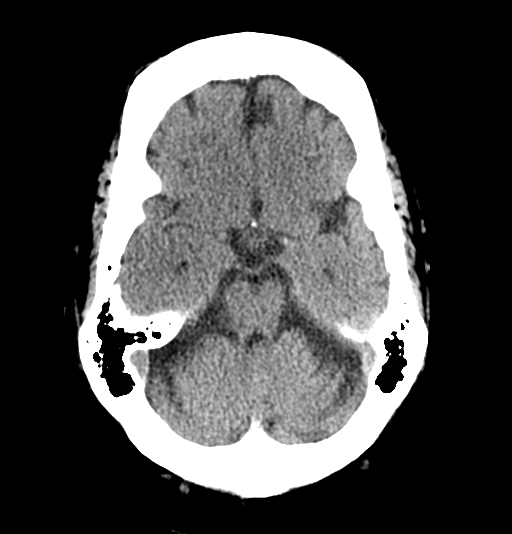
[im 20/38  brain]
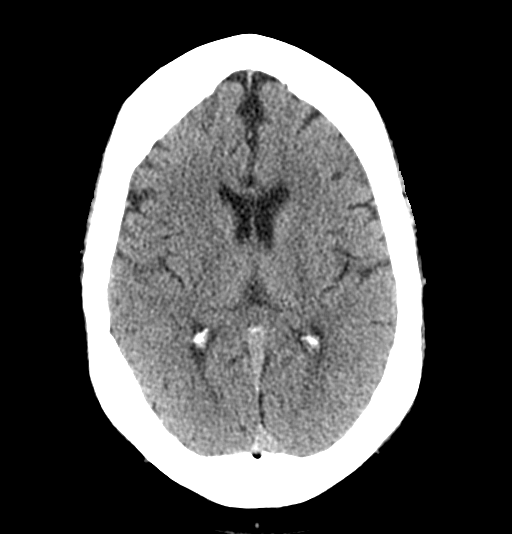
[im 20/38  bone]
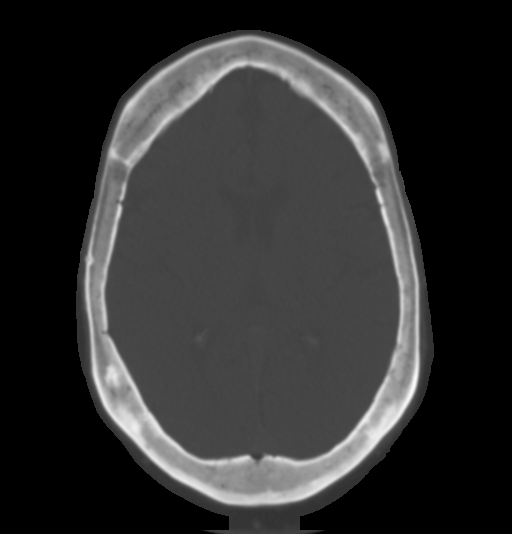
[im 23/38  brain]
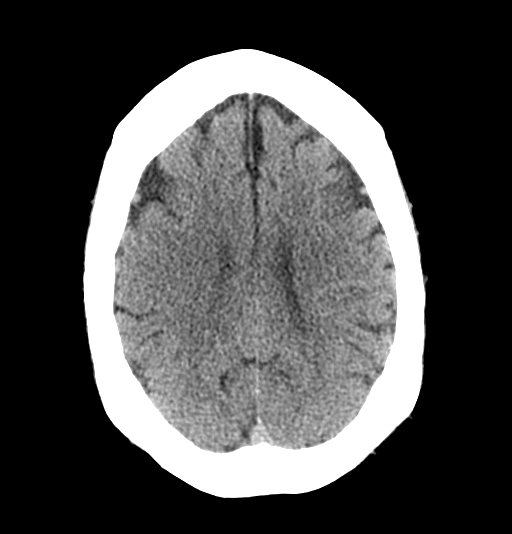
[im 28/38  brain]
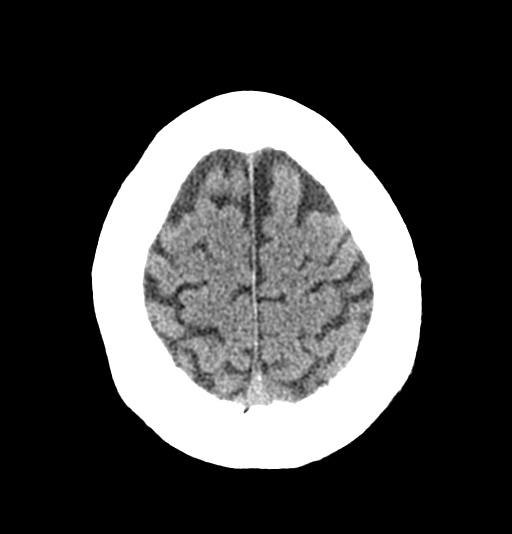
[im 30/38  brain]
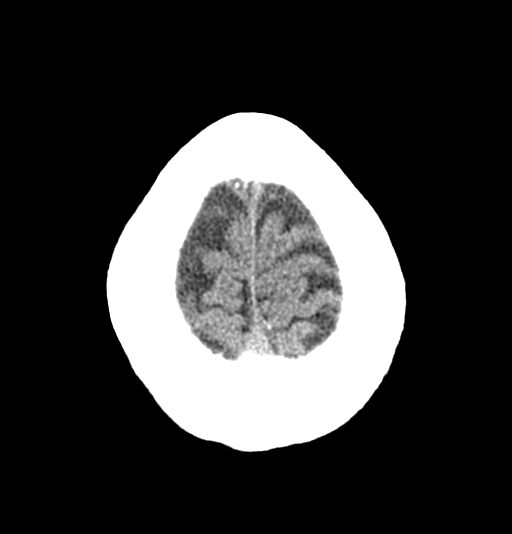
[im 35/38  brain]
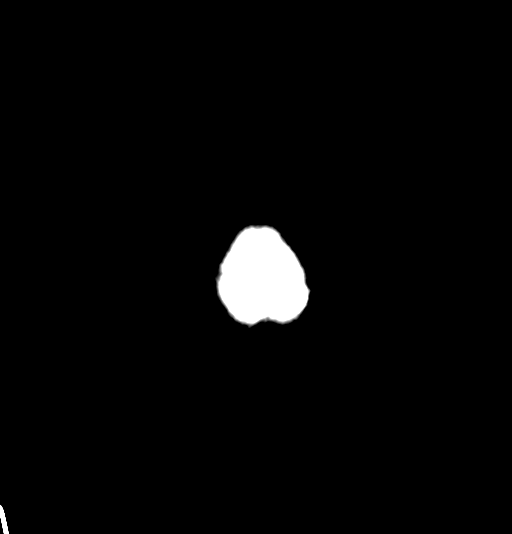
[im 35/38  bone]
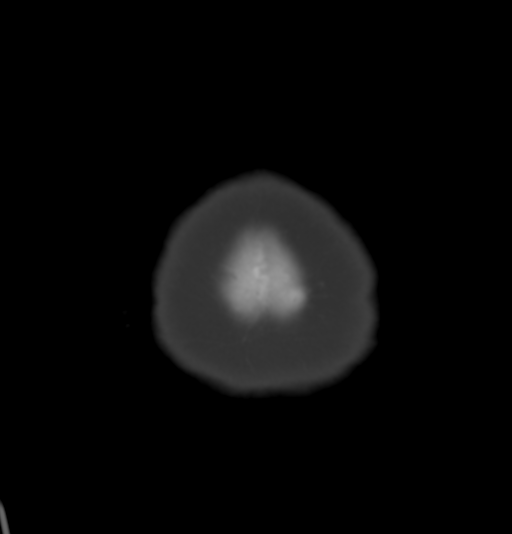

[Series 6: coronal soft tissue · coronal · 0.36mm/px · 3 of 70 slices shown]
[im 24/70  brain]
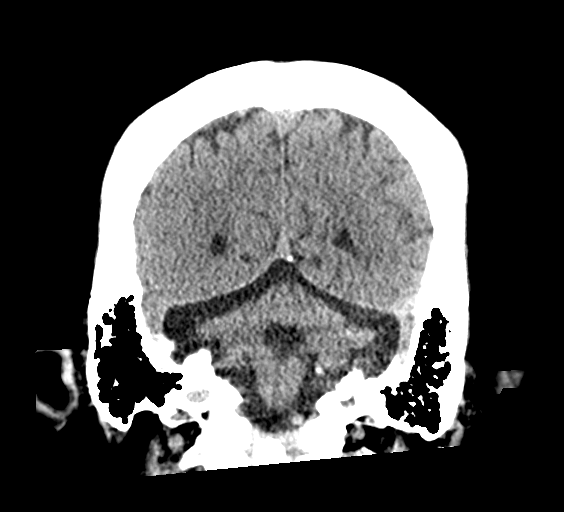
[im 31/70  brain]
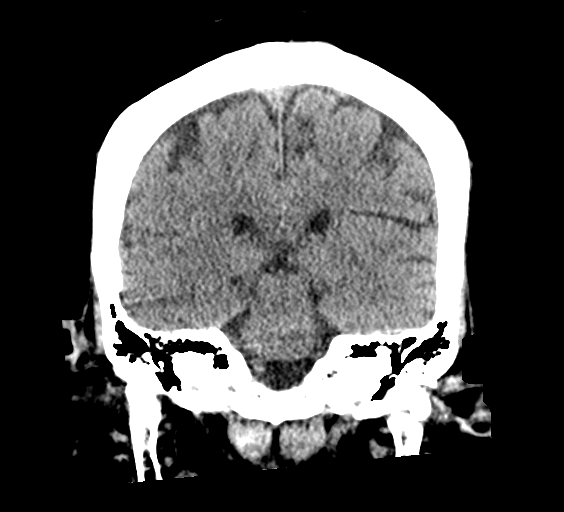
[im 39/70  brain]
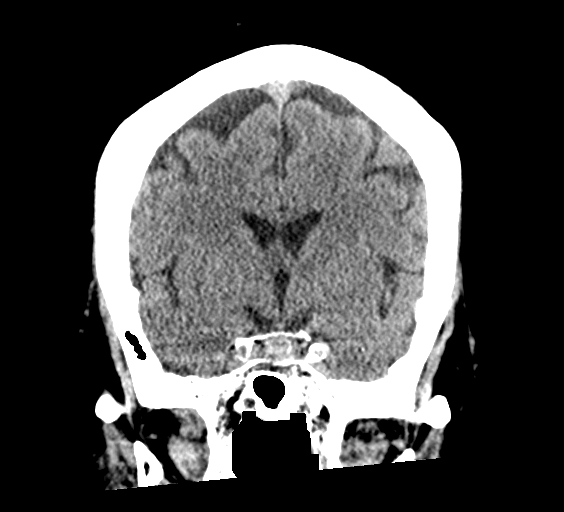

[Series 7: sagittal soft tissue · sagittal · 0.36mm/px · 3 of 55 slices shown]
[im 19/55  brain]
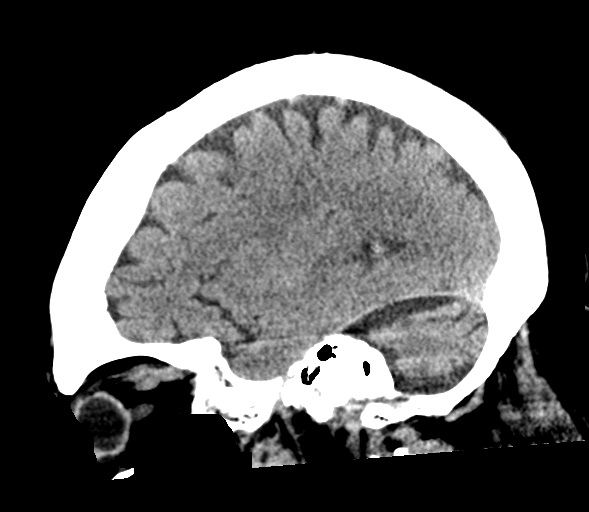
[im 28/55  brain]
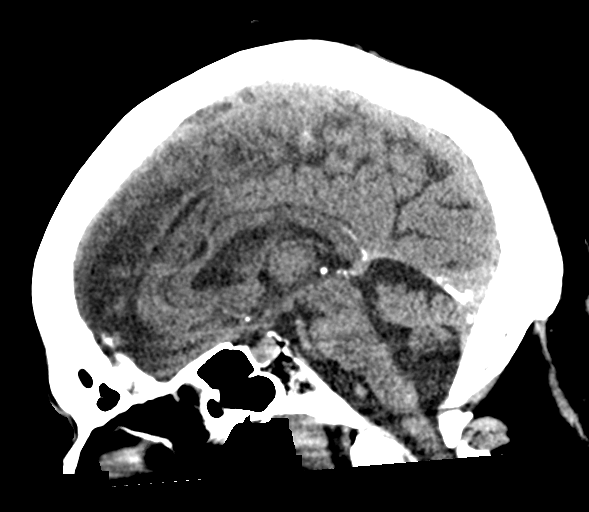
[im 37/55  brain]
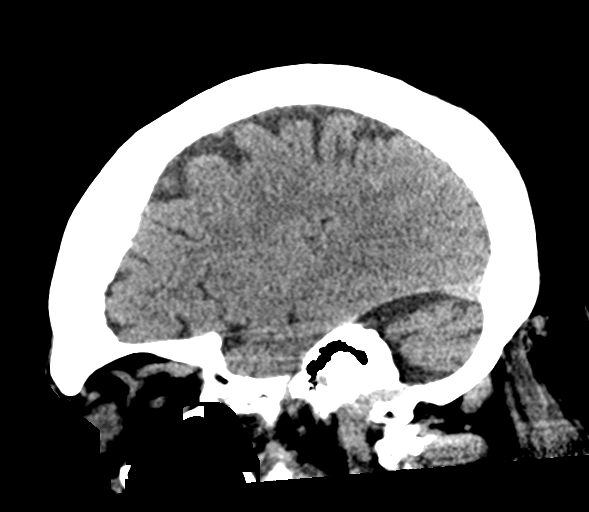

[15 of 47 positions shown; findings below may reference images not displayed]

FINDINGS: Brain: There is mild cerebral atrophy with widening of the
extra-axial spaces and ventricular dilatation.
There are areas of decreased attenuation within the white matter
tracts of the supratentorial brain, consistent with microvascular
disease changes.

Vascular: No hyperdense vessel or unexpected calcification.

Skull: Negative for fracture or focal lesion. Stable, diffuse
thickening of the calvarium is noted.

Sinuses/Orbits: There is mild bilateral ethmoid sinus mucosal
thickening.

Other: None.
IMPRESSION: No acute intracranial pathology.

## 2019-08-18 MED ORDER — ACETAMINOPHEN 325 MG PO TABS: 650.0000 mg | ORAL_TABLET | Freq: Four times a day (QID) | ORAL | Status: DC | PRN

## 2019-08-18 MED ORDER — SODIUM CHLORIDE 0.9 % IV SOLN
2.0000 g | INTRAVENOUS | Status: DC
  Administered 2019-08-19 – 2019-08-20 (×2): 2 g via INTRAVENOUS
  Filled 2019-08-18 (×2): qty 20

## 2019-08-18 MED ORDER — PROPOFOL 1000 MG/100ML IV EMUL
5.0000 ug/kg/min | INTRAVENOUS | Status: DC
  Filled 2019-08-18: qty 100

## 2019-08-18 MED ORDER — LEVETIRACETAM IN NACL 1500 MG/100ML IV SOLN
1500.0000 mg | Freq: Two times a day (BID) | INTRAVENOUS | Status: DC
  Filled 2019-08-18: qty 100

## 2019-08-18 MED ORDER — LEVETIRACETAM IN NACL 1000 MG/100ML IV SOLN
1000.0000 mg | Freq: Once | INTRAVENOUS | Status: AC
  Administered 2019-08-18: 14:00:00 1000 mg via INTRAVENOUS
  Filled 2019-08-18: qty 100

## 2019-08-18 MED ORDER — ONDANSETRON HCL 4 MG/2ML IJ SOLN: 4.0000 mg | Freq: Four times a day (QID) | INTRAMUSCULAR | Status: DC | PRN

## 2019-08-18 MED ORDER — SODIUM CHLORIDE 0.9 % IV SOLN
1.0000 g | Freq: Once | INTRAVENOUS | Status: AC
  Administered 2019-08-18: 1 g via INTRAVENOUS
  Filled 2019-08-18: qty 10

## 2019-08-18 MED ORDER — PHENOBARBITAL SODIUM 65 MG/ML IJ SOLN
30.0000 mg | Freq: Three times a day (TID) | INTRAMUSCULAR | Status: DC
  Filled 2019-08-18 (×2): qty 0.46

## 2019-08-18 MED ORDER — LEVETIRACETAM IN NACL 500 MG/100ML IV SOLN
500.0000 mg | Freq: Once | INTRAVENOUS | Status: DC
  Filled 2019-08-18: qty 100

## 2019-08-18 MED ORDER — ETOMIDATE 2 MG/ML IV SOLN
30.0000 mg | Freq: Once | INTRAVENOUS | Status: AC
  Administered 2019-08-18: 30 mg via INTRAVENOUS

## 2019-08-18 MED ORDER — LEVETIRACETAM IN NACL 1500 MG/100ML IV SOLN
1500.0000 mg | Freq: Two times a day (BID) | INTRAVENOUS | Status: DC
  Administered 2019-08-19 – 2019-08-20 (×3): 1500 mg via INTRAVENOUS
  Filled 2019-08-18 (×5): qty 100

## 2019-08-18 MED ORDER — PHENYTOIN SODIUM 50 MG/ML IJ SOLN
100.0000 mg | Freq: Three times a day (TID) | INTRAMUSCULAR | Status: DC
  Administered 2019-08-18 – 2019-08-20 (×5): 100 mg via INTRAVENOUS
  Filled 2019-08-18 (×8): qty 2

## 2019-08-18 MED ORDER — ATOMOXETINE HCL 10 MG PO CAPS: 30.0000 mg | ORAL_CAPSULE | Freq: Once | ORAL | Status: DC

## 2019-08-18 MED ORDER — LEVETIRACETAM IN NACL 1500 MG/100ML IV SOLN
1500.0000 mg | Freq: Two times a day (BID) | INTRAVENOUS | Status: DC
  Filled 2019-08-18 (×2): qty 100

## 2019-08-18 MED ORDER — PROPOFOL 1000 MG/100ML IV EMUL
INTRAVENOUS | Status: AC
  Administered 2019-08-18: 10 ug/kg/min via INTRAVENOUS
  Filled 2019-08-18: qty 100

## 2019-08-18 MED ORDER — ONDANSETRON HCL 4 MG PO TABS: 4.0000 mg | ORAL_TABLET | Freq: Four times a day (QID) | ORAL | Status: DC | PRN

## 2019-08-18 MED ORDER — LEVETIRACETAM IN NACL 500 MG/100ML IV SOLN
500.0000 mg | Freq: Once | INTRAVENOUS | Status: AC
  Administered 2019-08-18: 23:00:00 500 mg via INTRAVENOUS
  Filled 2019-08-18: qty 100

## 2019-08-18 MED ORDER — ALBUTEROL SULFATE (2.5 MG/3ML) 0.083% IN NEBU: 2.5000 mg | INHALATION_SOLUTION | RESPIRATORY_TRACT | Status: DC | PRN

## 2019-08-18 MED ORDER — ORAL CARE MOUTH RINSE
15.0000 mL | Freq: Two times a day (BID) | OROMUCOSAL | Status: DC
  Administered 2019-08-18 – 2019-08-21 (×5): 15 mL via OROMUCOSAL

## 2019-08-18 MED ORDER — PHENOBARBITAL SODIUM 65 MG/ML IJ SOLN
27.0000 mg | Freq: Three times a day (TID) | INTRAMUSCULAR | Status: DC
  Filled 2019-08-18: qty 0.42

## 2019-08-18 MED ORDER — PHENOBARBITAL SODIUM 65 MG/ML IJ SOLN
27.0000 mg | Freq: Three times a day (TID) | INTRAMUSCULAR | Status: DC
  Administered 2019-08-18 – 2019-08-20 (×6): 27.3 mg via INTRAVENOUS
  Filled 2019-08-18: qty 1
  Filled 2019-08-18: qty 0.42
  Filled 2019-08-18 (×3): qty 1
  Filled 2019-08-18: qty 0.42
  Filled 2019-08-18 (×3): qty 1

## 2019-08-18 MED ORDER — ENOXAPARIN SODIUM 40 MG/0.4ML ~~LOC~~ SOLN
40.0000 mg | Freq: Two times a day (BID) | SUBCUTANEOUS | Status: DC
  Administered 2019-08-18 – 2019-08-21 (×6): 40 mg via SUBCUTANEOUS
  Filled 2019-08-18 (×6): qty 0.4

## 2019-08-18 MED ORDER — LEVETIRACETAM IN NACL 500 MG/100ML IV SOLN: 500.0000 mg | Freq: Two times a day (BID) | INTRAVENOUS | Status: DC

## 2019-08-18 MED ORDER — LORAZEPAM 2 MG/ML IJ SOLN: 2.0000 mg | INTRAMUSCULAR | Status: DC | PRN

## 2019-08-18 MED ORDER — ACETAMINOPHEN 650 MG RE SUPP: 650.0000 mg | Freq: Four times a day (QID) | RECTAL | Status: DC | PRN

## 2019-08-18 MED ORDER — CHLORHEXIDINE GLUCONATE CLOTH 2 % EX PADS
6.0000 | MEDICATED_PAD | Freq: Every day | CUTANEOUS | Status: DC
  Administered 2019-08-18 – 2019-08-21 (×3): 6 via TOPICAL

## 2019-08-18 MED ORDER — SUCCINYLCHOLINE CHLORIDE 20 MG/ML IJ SOLN
120.0000 mg | Freq: Once | INTRAMUSCULAR | Status: AC
  Administered 2019-08-18: 14:00:00 120 mg via INTRAVENOUS

## 2019-08-18 NOTE — ED Notes (Signed)
Admitting MD at bedside.

## 2019-08-18 NOTE — ED Notes (Signed)
EDP stated pt ok to be taken to CT at this time. Opening eyes, moaning. Sister at bedside who states tongue and lip swelling is normal for pt. States pt normally talks to her. Pt looks at sister when sister talks but no clear speech returned. RT and this RN taking pt to CT on [address].

## 2019-08-18 NOTE — ED Provider Notes (Signed)
[facility] Emergency Department Provider Note ____________________________________________   First MD Initiated Contact with Patient 08/18/19 1349     (approximate)  I have reviewed the triage vital signs and the nursing notes.   HISTORY  Chief Complaint Seizures  Level 5 caveat: History of present illness limited due to altered mental status.   HPI Mariah Jimenez is a 74 y.o. female with PMH as noted below including seizure disorder and intellectual disability who presents for persistent seizures.  Per EMS, the patient was observed having continuous seizure activity for 15 minutes prior to their arrival, and for possibly an additional 15-25 minutes.  She received 6mg  of versed and the seizure activity resolved.  The patient is unable to give any history.   Past Medical History:  Diagnosis Date  . Mental retardation   . Seizures [facility])     Patient Active Problem List   Diagnosis Date Noted  . Seizure ([place]) 08/18/2019  . UTI (urinary tract infection) 08/18/2019  . Hypothermia 08/18/2019  . Acute respiratory failure with hypoxia ([place]) 08/18/2019  . Palliative care encounter 10/06/2018  . Debility 10/06/2018  . Tibia/fibula fracture 04/09/2016    Past Surgical History:  Procedure Laterality Date  . NO PAST SURGERIES    . TIBIA IM NAIL INSERTION Right 04/09/2016   Procedure: INTRAMEDULLARY (IM) NAIL TIBIAL;  Surgeon: Corky Mull, MD;  Location: [facility] ORS;  Service: Orthopedics;  Laterality: Right;    Prior to Admission medications   Medication Sig Start Date End Date Taking? Authorizing Provider  acetaminophen (TYLENOL) 325 MG tablet Take 650 mg by mouth every 6 (six) hours as needed.   Yes [provider]  levETIRAcetam (KEPPRA) 500 MG tablet Take 1,500 mg by mouth 2 (two) times daily.   Yes [provider]  loratadine (CLARITIN) 10 MG tablet Take 10 mg by mouth daily.   Yes [provider]  PHENObarbital (LUMINAL) 30  MG tablet Take 30 mg by mouth 3 (three) times daily.    Yes [provider]  phenytoin (DILANTIN) 100 MG ER capsule Take 300 mg by mouth at bedtime.   Yes [provider]  SPS 15 GM/60ML suspension Take 60 mLs by mouth every Wednesday. 06/16/19  Yes [provider]  Triamcinolone Acetonide (TRIAMCINOLONE 0.1 % CREAM : EUCERIN) CREA Apply 1 application topically 3 (three) times daily.    Yes [provider]    Allergies Patient has no known allergies.  Family History  Family history unknown: Yes    Social History Social History   Tobacco Use  . Smoking status: Never Smoker  . Smokeless tobacco: Never Used  Substance Use Topics  . Alcohol use: No  . Drug use: No    Review of Systems Level 5 caveat: unable to obtain review of systems due to altered mental status.     ____________________________________________   PHYSICAL EXAM:  VITAL SIGNS: ED Triage Vitals  Enc Vitals Group     BP 08/18/19 1333 (!) 177/97     Pulse Rate 08/18/19 1333 (!) 116     Resp 08/18/19 1333 (!) 37     Temp 08/18/19 1412 (!) 93.9 F (34.4 C)     Temp src --      SpO2 08/18/19 1333 100 %     Weight 08/18/19 1341 (!) 309 lb 8 oz (140.4 kg)     Height 08/18/19 1341 5\' 5"  (1.651 m)     Head Circumference --  Peak Flow --      Pain Score --      Pain Loc --      Pain Edu? --      Excl. in GC? --     Constitutional: Somnolent, minimally responsive.   Eyes: Conjunctivae are normal. EOMI.  PERRLA.  Head: Atraumatic. Nose: No congestion/rhinnorhea. Mouth/Throat: Tongue and lower lip appear swollen.  Uvula and oropharynx clear posteriorly.   Neck: Normal range of motion.  Cardiovascular: Tachycardic, regular rhythm. Grossly normal heart sounds.  Good peripheral circulation. Respiratory: Increased respiratory effort.  Lungs CTA bilaterally.  Gastrointestinal: Soft and nontender. No distention.  Genitourinary: No flank tenderness. Musculoskeletal:   Extremities warm and well perfused.  Neurologic:  Minimally responsive, responding to painful stimuli.  No active seizure.   Skin:  Skin is warm and dry. No rash noted. Psychiatric: Unable to assess.   ____________________________________________   LABS (all labs ordered are listed, but only abnormal results are displayed)  Labs Reviewed  BASIC METABOLIC PANEL - Abnormal; Notable for the following components:      Result Value   Glucose, Bld 108 (*)    All other components within normal limits  CBC - Abnormal; Notable for the following components:   Hemoglobin 11.4 (*)    RDW 15.6 (*)    All other components within normal limits  HEPATIC FUNCTION PANEL - Abnormal; Notable for the following components:   Total Protein 8.8 (*)    Albumin 2.9 (*)    AST 14 (*)    Alkaline Phosphatase 225 (*)    All other components within normal limits  URINALYSIS, COMPLETE (UACMP) WITH MICROSCOPIC - Abnormal; Notable for the following components:   Color, Urine AMBER (*)    APPearance TURBID (*)    pH 9.0 (*)    Protein, ur 100 (*)    Leukocytes,Ua MODERATE (*)    WBC, UA >50 (*)    Bacteria, UA MANY (*)    All other components within normal limits  RESPIRATORY PANEL BY RT PCR (FLU A&B, COVID)  MRSA PCR SCREENING  CULTURE, BLOOD (ROUTINE X 2)  CULTURE, BLOOD (ROUTINE X 2)  URINE CULTURE  LACTIC ACID, PLASMA  LACTIC ACID, PLASMA  BRAIN NATRIURETIC PEPTIDE  PROTIME-INR  GLUCOSE, CAPILLARY  BASIC METABOLIC PANEL  CBC  PHENYTOIN LEVEL, TOTAL  PHENOBARBITAL LEVEL  CBG MONITORING, ED  TROPONIN I (HIGH SENSITIVITY)  TROPONIN I (HIGH SENSITIVITY)   ____________________________________________  EKG  ED ECG REPORT I, Dionne Bucy, the attending physician, personally viewed and interpreted this ECG.  Date: 08/18/2019 EKG Time: 1329 Rate: 120 Rhythm: Sinus tachycardia QRS Axis: normal Intervals: normal ST/T Wave abnormalities: Nonspecific ST abnormality Narrative  Interpretation: no evidence of acute ischemia  ____________________________________________  RADIOLOGY  CT head: No ICH or other acute abnormality. CXR: No focal infiltrate.  ET tube 2cm above carina. AXR: OG tube tip in stomach.   ____________________________________________   PROCEDURES  Procedure(s) performed: Yes  Procedure Name: Intubation Date/Time: 08/18/2019 1:45 PM Performed by: Dionne Bucy, MD Pre-anesthesia Checklist: Patient identified, Patient being monitored, Emergency Drugs available, Timeout performed and Suction available Oxygen Delivery Method: Ambu bag Preoxygenation: Pre-oxygenation with 100% oxygen Induction Type: Rapid sequence Ventilation: Mask ventilation without difficulty and Two handed mask ventilation required Laryngoscope Size: Glidescope and 4 Grade View: Grade II Tube size: 7.0 mm Number of attempts: 1 Placement Confirmation: ETT inserted through vocal cords under direct vision,  CO2 detector and Breath sounds checked- equal and bilateral Secured at: 23 cm Tube  secured with: ETT holder       Critical Care performed: Yes  CRITICAL CARE Performed by: Dionne Bucy   Total critical care time: 60 minutes  Critical care time was exclusive of separately billable procedures and treating other patients.  Critical care was necessary to treat or prevent imminent or life-threatening deterioration.  Critical care was time spent personally by me on the following activities: development of treatment plan with patient and/or surrogate as well as nursing, discussions with consultants, evaluation of patient's response to treatment, examination of patient, obtaining history from patient or surrogate, ordering and performing treatments and interventions, ordering and review of laboratory studies, ordering and review of radiographic studies, pulse oximetry and re-evaluation of patient's  condition. ____________________________________________   INITIAL IMPRESSION / ASSESSMENT AND PLAN / ED COURSE  Pertinent labs & imaging results that were available during my care of the patient were reviewed by me and considered in my medical decision making (see chart for details).  74 year old female with PMH as noted above presents with seizure like activity for possibly as long as 40 minutes, now resolved after being given versed by EMS.  On exam, the patient appeared somnolent and was only responsive to painful stimuli.  She was on a NRB mask with O2 in the high 90s but had some gurgling sounds, and the tongue and lower lip appeared quite swollen.    I reviewed the past medical records in Epic.  The patient has a DNR form in place but it is unclear as to her wishes as to possible intubation.  She was most recently seen in March due to seizure.  There are no admissions within the last 2 years.  I attempted to get more information from the family.  After multiple calls with dead lines or no answer, I got in touch with the patient's sister [name] who advised that although the patient is on palliative care and is DNR, she is not DNI and the family would consider intubation if indicated especially for possible reversible causes.    Overall based on the prolonged seizure and concern for status epilepticus, poor mental and respiratory status status, poor airway protection, and concern for tongue swelling, I explained that intubation was indicated and the sister agreed.  The patient was then intubated successfully on the first attempt via Glidescope, with no complications.    Differential includes medication noncompliance (unlikely as she is at a facility)  ICH or other CNS cause, infection or other precipitating etiology.  Now that the airway is secure we will obtain CT head, lab workup, and reassess.  Plan will be for admission, although the patient may need likely transfer if she will require EEG  monitoring.  ----------------------------------------- 4:00 PM on 08/18/2019 -----------------------------------------  I contacted [facility].  I discussed the case with Dr. Otelia Limes from neurology there, who recommended that I consult Dr. Loretha Brasil from neurology for an initial assessment and further recommendations, and consider transfer based on his evaluation if indicated.    I then contact Dr. Loretha Brasil who agreed to see the patient in the ED and advised that he would be able to arrange for EEG and that he would contact ICU here.  Subsequently the RN reported to me that the patient self-extubated.  On initial assessment, she appeared much more awake and comfortable.  She was breathing without apparent difficulty, maintaining a normal O2 saturation on room air, and protecting her airway.  Given this change, I felt  it was appropriate to not attempt reintubation.   Shortly thereafter her sister arrived.  She confirmed the known history and advised that the tongue and lip swelling were actually the patient's baseline.  She agreed with expectant management and holding off on reintubation unless absolutely necessary.  I discussed the case with Dr. Jayme Cloud from ICU to update her on the clinical status; she recommended stepdown admission under the hospitalist.  As the patient was still pending CT and neurology evaluation, I signed her out to the oncoming physician Dr. Roxan Hockey.   ____________________________________________   FINAL CLINICAL IMPRESSION(S) / ED DIAGNOSES  Final diagnoses:  Seizure (HCC)  Lower urinary tract infectious disease  Altered mental status, unspecified altered mental status type      NEW MEDICATIONS STARTED DURING THIS VISIT:  Current Discharge Medication List       Note:  This document was prepared using Dragon voice recognition software and may include unintentional dictation errors.   Dionne Bucy, MD 08/18/19  2023

## 2019-08-18 NOTE — Progress Notes (Addendum)
MEDICATION RELATED CONSULT NOTE - INITIAL   Pharmacy Consult for  Converison of antiepileptics from PO to IV  Indication: Seizure   No Known Allergies  Patient Measurements: Height: 5\' 5"  (165.1 cm) Weight: (!) 140.4 kg (309 lb 8 oz) IBW/kg (Calculated) : 57 Adjusted Body Weight:   Vital Signs: Temp: 96.5 F (35.8 C) (05/08 1700) BP: 135/83 (05/08 1700) Pulse Rate: 92 (05/08 1700) Intake/Output from previous day: No intake/output data recorded. Intake/Output from this shift: Total I/O In: 248 [I.V.:48; IV Piggyback:200] Out: -   Labs: Recent Labs    08/18/19 1337  WBC 9.2  HGB 11.4*  HCT 36.8  PLT 305  CREATININE 0.52  ALBUMIN 2.9*  PROT 8.8*  AST 14*  ALT 15  ALKPHOS 225*  BILITOT 0.3  BILIDIR <0.1  IBILI NOT CALCULATED   Estimated Creatinine Clearance: 90.7 mL/min (by C-G formula based on SCr of 0.52 mg/dL).   Microbiology: No results found for this or any previous visit (from the past 720 hour(s)).  Medical History: Past Medical History:  Diagnosis Date  . Mental retardation   . Seizures (HCC)     Medications:  (Not in a hospital admission)   Assessment: PTA anti-epileptic meds:   Keppra 1500 mg PO BID,  Last dose on 5/8 @ 0800.  Pt had Keppra 1000 mg IV X 1 in ED @ 1400.  Phenytoin 300 mg ER PO QHS , last dose on 5/7 @ 2200 Phenobarbital 30 mg PO TID , last dose on 5/8 @ 0900.  Goal of Therapy:  Prevention of seizure  Plan:  Will start :  Will give additional Keppra 500 mg IV X 1 on 5/8 @ 2200 since she received a 1 gm dose in the ED @ 1400.  Will resume Keppra 1500 mg IV Q12H on 5/9 @ 1000. Phenytoin 100 mg IV Q8H to start on 5/8 @ 2200. Phenobarbital 27.3 mg IV TID to start 5/8 @ 1700.   Mariah Jimenez D 08/18/2019,5:12 PM

## 2019-08-18 NOTE — ED Notes (Signed)
Pt pulled ET tube and OG tube out. EDP informed. Pt placed on nasal cannula at 4 LPM d/t sats dropping into 80's. Pt will look at staff talking to her but will not respond with talking. Unsure pt baseline. Pt will open eyes somewhat.

## 2019-08-18 NOTE — Progress Notes (Signed)
Anticoagulation monitoring(Lovenox):  74 yo female ordered Lovenox 40 mg Q12h  Filed Weights   08/18/19 1341  Weight: (!) 140.4 kg (309 lb 8 oz)   BMI 51   Lab Results  Component Value Date   CREATININE 0.52 08/18/2019   CREATININE 0.49 07/06/2019   CREATININE 0.82 04/25/2016   Estimated Creatinine Clearance: 90.7 mL/min (by C-G formula based on SCr of 0.52 mg/dL). Hemoglobin & Hematocrit     Component Value Date/Time   HGB 11.4 (L) 08/18/2019 1337   HCT 36.8 08/18/2019 1337     Per Protocol for Patient with estCrcl > 30 ml/min and BMI > 40, will transition to Lovenox 40 mg Q12h.

## 2019-08-18 NOTE — ED Notes (Signed)
X-ray at bedside

## 2019-08-18 NOTE — ED Triage Notes (Signed)
emergency traffic [facility] from [facility] seizing 15 minutes there 6mg  versed by EMS IM overall seizing 40 minutes nonrebreather upon arrival hx seizure, did take meds today DNR 160/110, HR 120, CBG 220 [place] non-responsive, not talking, just moaning, suctioned upon arrival

## 2019-08-18 NOTE — Progress Notes (Signed)
Neurology:  74 y/o F with hx of MR, Seizure d/o on Keppra, phenobarbital and phenytoin presents with seizure that was prolonged. Patient required 6 mg of versed after which she was intubated.  Soon after intubation patient self extubated herself.  Oxygenation improved.  - Pt started to wake up and follow minimal commands  - AT home on Keppra 1500 BID, Phenobarbital 30mg  TIT, and dilantin 300 qhs.      Plan: -Personally not a big fan of dilantin/phenobarbital given that one induces and other inhibits each other counter acting each others effects but appears she has bee on them for a while - Pt has UTI that could have lowered the seizure threshold.  - Pt placed on IV Keppra 1500BID IV, Dilantin 100 tID IV, phenytoin 100 IV TID - phenobarb and dilantin levels ordered for AM - will follow

## 2019-08-18 NOTE — H&P (Signed)
History and Physical    Damon Baisch YQI:[id number] DOB: Mar 31, 1946 DOA: 08/18/2019  Referring MD/NP/PA:   PCP: Randell Loop, [facility]   Patient coming from:  The patient is coming from SNF.  At baseline, pt is dependent for most of ADL.        Chief Complaint: seizure  HPI: Mariah Jimenez is a 74 y.o. female with medical history significant of seizure, mental retardation, who presents with seizure.  Per her sister, pt had seizure which lasted for about 40 min. Pt was treated with 6 mg Versed by EMS. Pt was nonresponsive and and was intubated in ED. Later on, patient self extubated.  Patient is still nonresponsive. She moves extremities to slightly upon painful stimuli. She had oxygen desaturation to 86%, initially needed nonrebreather, then change to 4 L nasal cannula oxygen with oxygen saturation 100% on room air.  When I saw patient in the ED, she is on 4L of oxygen with oxygen saturation 100%.  I turned off oxygen, her saturation is 100 % on room air. No active nausea, vomiting, diarrhea noted.  No active respiratory distress, cough noted.  Does not seem to have pain anywhere. Per her sister, pt has tongue and lip swelling which is normal to patient.  ED Course: pt was found to have WBC 9.2, BUN 32, troponin 3, INR 1.0, lactic acid 1.9, pending COVID-19 PCR, urinalysis (turbid appearance, moderate amount of leukocyte, many bacteria, squamous cell 11-20, WBC >50), electrolytes renal function okay, hypothermia with temperature 96.3, blood pressure 213/120, currently 122/86, tachycardia, tachypnea.  Chest x-ray without infiltration.  CT of head is negative for acute intracranial abnormalities.  Patient is admitted to stepdown for observation. Dr. Jayme Cloud of ICU and Dr. Loretha Brasil of neurology are consulted by by ED physician  Review of Systems: Could not be reviewed  Allergy: No Known Allergies  Past Medical History:  Diagnosis Date  . Mental retardation   . Seizures  (HCC)     Past Surgical History:  Procedure Laterality Date  . NO PAST SURGERIES    . TIBIA IM NAIL INSERTION Right 04/09/2016   Procedure: INTRAMEDULLARY (IM) NAIL TIBIAL;  Surgeon: Christena Flake, MD;  Location: [facility] ORS;  Service: Orthopedics;  Laterality: Right;    Social History:  reports that she has never smoked. She has never used smokeless tobacco. She reports that she does not drink alcohol or use drugs.  Family History:  Family History  Family history unknown: Yes     Prior to Admission medications   Medication Sig Start Date End Date Taking? Authorizing Provider  acetaminophen (TYLENOL) 325 MG tablet Take 650 mg by mouth every 6 (six) hours as needed.   Yes [provider]  levETIRAcetam (KEPPRA) 500 MG tablet Take 1,500 mg by mouth 2 (two) times daily.   Yes [provider]  loratadine (CLARITIN) 10 MG tablet Take 10 mg by mouth daily.   Yes [provider]  PHENObarbital (LUMINAL) 30 MG tablet Take 30 mg by mouth 3 (three) times daily.    Yes [provider]  phenytoin (DILANTIN) 100 MG ER capsule Take 300 mg by mouth at bedtime.   Yes [provider]  SPS 15 GM/60ML suspension Take 60 mLs by mouth every Wednesday. 06/16/19  Yes [provider]  Triamcinolone Acetonide (TRIAMCINOLONE 0.1 % CREAM : EUCERIN) CREA Apply 1 application topically 3 (three) times daily.    Yes [provider]    Physical Exam: Vitals:   08/18/19 1600  08/18/19 1615 08/18/19 1645 08/18/19 1700  BP: 139/71 124/67 132/85 135/83  Pulse: 87  87 92  Resp: 15 19 19 18   Temp: (!) 96.4 F (35.8 C) (!) 96.4 F (35.8 C) (!) 96.4 F (35.8 C) (!) 96.5 F (35.8 C)  SpO2: 100%  100% 99%  Weight:      Height:       General: Not in acute distress HEENT:       Eyes: PERRL, EOMI, no scleral icterus.       ENT: No discharge from the ears and nose       Neck: No JVD, no bruit, no mass felt. Heme: No neck lymph node enlargement. Cardiac:  S1/S2, RRR, No murmurs, No gallops or rubs. Respiratory: Has rhonchi bilaterally  GI: Soft, nondistended, nontender, no organomegaly, BS present. GU: No hematuria Ext: 1+ pitting leg edema bilaterally. 1+DP/PT pulse bilaterally. Musculoskeletal: No joint deformities, No joint redness or warmth, no limitation of ROM in spin. Skin: No rashes.  Neuro: Nonresponsive, slightly moves extremities on painful stimuli.     Labs on Admission: I have personally reviewed following labs and imaging studies  CBC: Recent Labs  Lab 08/18/19 1337  WBC 9.2  HGB 11.4*  HCT 36.8  MCV 86.6  PLT 151   Basic Metabolic Panel: Recent Labs  Lab 08/18/19 1337  NA 138  K 4.2  CL 102  CO2 26  GLUCOSE 108*  BUN 14  CREATININE 0.52  CALCIUM 9.2   GFR: Estimated Creatinine Clearance: 90.7 mL/min (by C-G formula based on SCr of 0.52 mg/dL). Liver Function Tests: Recent Labs  Lab 08/18/19 1337  AST 14*  ALT 15  ALKPHOS 225*  BILITOT 0.3  PROT 8.8*  ALBUMIN 2.9*   No results for input(s): LIPASE, AMYLASE in the last 168 hours. No results for input(s): AMMONIA in the last 168 hours. Coagulation Profile: Recent Labs  Lab 08/18/19 1548  INR 1.0   Cardiac Enzymes: No results for input(s): CKTOTAL, CKMB, CKMBINDEX, TROPONINI in the last 168 hours. BNP (last 3 results) No results for input(s): PROBNP in the last 8760 hours. HbA1C: No results for input(s): HGBA1C in the last 72 hours. CBG: No results for input(s): GLUCAP in the last 168 hours. Lipid Profile: No results for input(s): CHOL, HDL, LDLCALC, TRIG, CHOLHDL, LDLDIRECT in the last 72 hours. Thyroid Function Tests: No results for input(s): TSH, T4TOTAL, FREET4, T3FREE, THYROIDAB in the last 72 hours. Anemia Panel: No results for input(s): VITAMINB12, FOLATE, FERRITIN, TIBC, IRON, RETICCTPCT in the last 72 hours. Urine analysis:    Component Value Date/Time   COLORURINE AMBER (A) 08/18/2019 1412   APPEARANCEUR TURBID (A)  08/18/2019 1412   LABSPEC 1.012 08/18/2019 1412   PHURINE 9.0 (H) 08/18/2019 1412   GLUCOSEU NEGATIVE 08/18/2019 1412   HGBUR NEGATIVE 08/18/2019 [place] 08/18/2019 1412   KETONESUR NEGATIVE 08/18/2019 1412   PROTEINUR 100 (A) 08/18/2019 1412   UROBILINOGEN 0.2 05/20/2013 0437   NITRITE NEGATIVE 08/18/2019 1412   LEUKOCYTESUR MODERATE (A) 08/18/2019 1412   Sepsis Labs: @LABRCNTIP (procalcitonin:4,lacticidven:4) )No results found for this or any previous visit (from the past 240 hour(s)).   Radiological Exams on Admission: CT Head Wo Contrast  Result Date: 08/18/2019 CLINICAL DATA:  Seizure. EXAM: CT HEAD WITHOUT CONTRAST TECHNIQUE: Contiguous axial images were obtained from the base of the skull through the vertex without intravenous contrast. COMPARISON:  July 06, 2019 FINDINGS: Brain: There is mild cerebral atrophy with widening of the extra-axial spaces  and ventricular dilatation. There are areas of decreased attenuation within the white matter tracts of the supratentorial brain, consistent with microvascular disease changes. Vascular: No hyperdense vessel or unexpected calcification. Skull: Negative for fracture or focal lesion. Stable, diffuse thickening of the calvarium is noted. Sinuses/Orbits: There is mild bilateral ethmoid sinus mucosal thickening. Other: None. IMPRESSION: No acute intracranial pathology. Electronically Signed   By: Aram Candela M.D.   On: 08/18/2019 15:59   DG Chest Portable 1 View  Result Date: 08/18/2019 CLINICAL DATA:  Hypoxia EXAM: PORTABLE CHEST 1 VIEW COMPARISON:  None. FINDINGS: Endotracheal tube tip is 2.0 cm above the carina. Nasogastric tube tip and side port are below the diaphragm. No pneumothorax. There is a small left pleural effusion. There is atelectatic change in the right mid lung and bibasilar regions. No airspace consolidation. Heart is mildly enlarged with pulmonary vascularity normal. No adenopathy. No bone lesions.  IMPRESSION: Tube positions as described without pneumothorax. Small left pleural effusion with bibasilar and right midlung atelectasis. No consolidation. Mild cardiomegaly. Electronically Signed   By: Bretta Bang III M.D.   On: 08/18/2019 14:11   DG Abd Portable 1 View  Result Date: 08/18/2019 CLINICAL DATA:  Orogastric tube placement EXAM: PORTABLE ABDOMEN - 1 VIEW COMPARISON:  None. FINDINGS: Orogastric tube tip and side port in stomach. No bowel dilatation or air-fluid level to suggest bowel obstruction. No free air. Moderate stool in colon. IMPRESSION: Orogastric tube tip and side port in stomach. No bowel obstruction or free air evident. Electronically Signed   By: Bretta Bang III M.D.   On: 08/18/2019 14:11     EKG: Independently reviewed.  Sinus rhythm, QTC 447, low voltage, LAE, borderline LAD.  Assessment/Plan Principal Problem:   Seizure (HCC) Active Problems:   UTI (urinary tract infection)   Hypothermia   Acute respiratory failure with hypoxia (HCC)   Seizure [facility]): Patient had seizure for 40 minutes, suspected seizure status, initially intubated, but later on self extubated.  Currently patient seems to have protected airway. Dr. Jayme Cloud of ICU and Dr. Loretha Brasil of neurology are consulted by by ED physician  -will place on SDU for obs -loaded with 1g of Keppra -will switch all seizure medications to IV with pharmacist consult -Seizure precaution -When necessary Ativan for seizure  Possible UTI (urinary tract infection):  UA is positive for UTI, but with squamous cell contamination. Due to seizure which could be triggered by UTI, will start ABx. -IV Rocephin -Follow-up blood culture and urine culture  Hypothermia -Bair hugger  Acute respiratory failure with hypoxia (HCC): Seem to be transient issue.  Chest x-ray negative.  Currently patient's oxygen saturation is 100% on room air when I turned off of her nasal cannula oxygen -As needed albuterol nebs -Prn  nasal cannula oxygen to maintain oxygen saturation above 93%    DVT ppx: SQ Lovenox Code Status: partial code per her sister (OK for intubation, but no CPR)  Family Communication: Yes, patient's sister at bed side Disposition Plan:  Anticipate discharge back to previous SNF environment Consults called: Dr. Jayme Cloud of ICU and Dr. Loretha Brasil of neurology are consulted by by ED physician Admission status: SDU/obs          Status is: Observation  The patient remains OBS appropriate and will d/c before 2 midnights.  Dispo: The patient is from: SNF              Anticipated d/c is to: SNF  Anticipated d/c date is: 1 day              Patient currently is not medically stable to d/c.           Date of Service 08/18/2019    Lorretta Harp [facility]   If 7PM-7AM, please contact night-coverage [email or web address] 08/18/2019, 5:09 PM

## 2019-08-18 NOTE — ED Notes (Signed)
Pt provided blankets. Lab at bedside to redraw blue top.

## 2019-08-18 NOTE — ED Notes (Signed)
Recollect blue top sent to lab at this time.

## 2019-08-19 DIAGNOSIS — Z20822 Contact with and (suspected) exposure to covid-19: Secondary | ICD-10-CM | POA: Diagnosis not present

## 2019-08-19 DIAGNOSIS — Z9114 Patient's other noncompliance with medication regimen: Secondary | ICD-10-CM | POA: Diagnosis not present

## 2019-08-19 DIAGNOSIS — J9601 Acute respiratory failure with hypoxia: Secondary | ICD-10-CM | POA: Diagnosis not present

## 2019-08-19 DIAGNOSIS — E662 Morbid (severe) obesity with alveolar hypoventilation: Secondary | ICD-10-CM | POA: Diagnosis not present

## 2019-08-19 DIAGNOSIS — R1310 Dysphagia, unspecified: Secondary | ICD-10-CM | POA: Diagnosis not present

## 2019-08-19 DIAGNOSIS — R569 Unspecified convulsions: Secondary | ICD-10-CM | POA: Diagnosis not present

## 2019-08-19 DIAGNOSIS — G9349 Other encephalopathy: Secondary | ICD-10-CM | POA: Diagnosis not present

## 2019-08-19 DIAGNOSIS — N390 Urinary tract infection, site not specified: Secondary | ICD-10-CM | POA: Diagnosis not present

## 2019-08-19 DIAGNOSIS — F79 Unspecified intellectual disabilities: Secondary | ICD-10-CM | POA: Diagnosis not present

## 2019-08-19 DIAGNOSIS — Z6841 Body Mass Index (BMI) 40.0 and over, adult: Secondary | ICD-10-CM | POA: Diagnosis not present

## 2019-08-19 DIAGNOSIS — R680 Hypothermia, not associated with low environmental temperature: Secondary | ICD-10-CM | POA: Diagnosis not present

## 2019-08-19 DIAGNOSIS — Z79899 Other long term (current) drug therapy: Secondary | ICD-10-CM | POA: Diagnosis not present

## 2019-08-19 DIAGNOSIS — G40409 Other generalized epilepsy and epileptic syndromes, not intractable, without status epilepticus: Secondary | ICD-10-CM | POA: Diagnosis not present

## 2019-08-19 LAB — BASIC METABOLIC PANEL
Anion gap: 8 (ref 5–15)
BUN: 15 mg/dL (ref 8–23)
CO2: 26 mmol/L (ref 22–32)
Calcium: 9.1 mg/dL (ref 8.9–10.3)
Chloride: 106 mmol/L (ref 98–111)
Creatinine, Ser: 0.51 mg/dL (ref 0.44–1.00)
GFR calc Af Amer: >60 mL/min (ref >60)
GFR calc non Af Amer: >60 mL/min (ref >60)
Glucose, Bld: 112 mg/dL — ABNORMAL HIGH (ref 70–99)
Potassium: 4.6 mmol/L (ref 3.5–5.1)
Sodium: 140 mmol/L (ref 135–145)

## 2019-08-19 LAB — CBC
HCT: 34.8 % — ABNORMAL LOW (ref 36.0–46.0)
Hemoglobin: 10.9 g/dL — ABNORMAL LOW (ref 12.0–15.0)
MCH: 27 pg (ref 26.0–34.0)
MCHC: 31.3 g/dL (ref 30.0–36.0)
MCV: 86.4 fL (ref 80.0–100.0)
Platelets: 270 10*3/uL (ref 150–400)
RBC: 4.03 MIL/uL (ref 3.87–5.11)
RDW: 15.7 % — ABNORMAL HIGH (ref 11.5–15.5)
WBC: 8.4 10*3/uL (ref 4.0–10.5)
nRBC: 0 % (ref 0.0–0.2)

## 2019-08-19 LAB — GLUCOSE, CAPILLARY: Glucose-Capillary: 88 mg/dL (ref 70–99)

## 2019-08-19 MED ORDER — SODIUM CHLORIDE 0.9 % IV SOLN
INTRAVENOUS | Status: DC | PRN
  Administered 2019-08-19: 250 mL via INTRAVENOUS

## 2019-08-19 NOTE — Plan of Care (Signed)
Continue with current plan of care.

## 2019-08-19 NOTE — Procedures (Signed)
Patient Name: Mariah Jimenez  MRN: [id number]  Epilepsy Attending: Charlsie Quest  Referring Physician/Provider: Dr Pauletta Browns Date: 08/19/2019 Duration: 24.33 mins  Patient history: 74 y.o. female with medical history significant ofseizure, mental retardation, who presents with seizure. EEG to evaluate for seizure.  Level of alertness: awake, asleep  AEDs during EEG study: PHT, LEV, PB  Technical aspects: This EEG study was done with scalp electrodes positioned according to the 10-20 International system of electrode placement. Electrical activity was acquired at a sampling rate of 500Hz  and reviewed with a high frequency filter of 70Hz  and a low frequency filter of 1Hz . EEG data were recorded continuously and digitally stored.   DESCRIPTION: The posterior dominant rhythm consists of 7 Hz activity of moderate voltage (25-35 uV) seen predominantly in posterior head regions, symmetric and reactive to eye opening and eye closing. Sleep was characterized by vertex waves, sleep spindles (12-14hz ), maximal frontocentral region. Generalized spikes and spike and wave were also noted, predominantly during sleep. Physiologic photic driving was not seen during photic stimulation.  Hyperventilation was not performed.  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 No seizures were seen throughout the recording.  [name] 

## 2019-08-19 NOTE — Progress Notes (Signed)
PROGRESS NOTE    Mariah Jimenez  RXV:[id number] DOB: 09/24/1945 DOA: 08/18/2019 PCP: Randell Loop, [facility]   Brief Narrative:  HPI: Mariah Jimenez is a 74 y.o. female with medical history significant of seizure, mental retardation, who presents with seizure.  Per her sister, pt had seizure which lasted for about 40 min. Pt was treated with 6 mg Versed by EMS. Pt was nonresponsive and and was intubated in ED. Later on, patient self extubated.  Patient is still nonresponsive. She moves extremities to slightly upon painful stimuli. She had oxygen desaturation to 86%, initially needed nonrebreather, then change to 4 L nasal cannula oxygen with oxygen saturation 100% on room air.  When I saw patient in the ED, she is on 4L of oxygen with oxygen saturation 100%.  I turned off oxygen, her saturation is 100 % on room air. No active nausea, vomiting, diarrhea noted.  No active respiratory distress, cough noted.  Does not seem to have pain anywhere. Per her sister, pt has tongue and lip swelling which is normal to patient.  5/9: Seen and examined in stepdown unit.  Hemodynamically stable.  Little lethargic but does open her eyes upon prompting.  On 2 to 3 L nasal cannula.  Saturating 100%.  Does desaturate when she falls asleep.  No further seizure activity noted.  Neurology following.  Medication recommendations appreciated.   Assessment & Plan:   Principal Problem:   Seizure (HCC) Active Problems:   UTI (urinary tract infection)   Hypothermia   Acute respiratory failure with hypoxia (HCC)  Seizure [facility]):  Patient had seizure for 40 minutes, suspected seizure status, initially intubated, but later on self extubated. Highly doubt this was true status epilepticus Patient sats are preserved, she is protecting her airway Neurology following Plan: Transfer to PCU IV Keppra 1500 mg twice daily IV Dilantin 100 mg 3 times daily IV phenytoin 100 mg IV 3 times daily As needed  Ativan for seizure activity Phenobarbital and Dilantin levels ordered Neurology following  Possible UTI (urinary tract infection) UA is positive for UTI, but with squamous cell contamination.  Possible treated for breakthrough seizure however suspect medication nonadherence Plan: -IV Rocephin -Follow-up blood culture and urine culture  Hypothermia resolved -Bair hugger  Acute respiratory failure with hypoxia [facility]): Chest x-ray negative.   Currently patient's oxygen saturation is 100% on room air when I turned off of her nasal cannula oxygen.  But upon falling asleep her oxygen does drop.  Suspect underlying OSA/OHS Plan: -As needed albuterol nebs -Prn nasal cannula oxygen to maintain oxygen saturation above 93%   DVT prophylaxis: Lovenox Code Status: Partial, okay with intubation but no CPR Family Communication: None today Disposition Plan: Unclear at this time.  Patient is currently observation status.  Awaiting recommendation from utilization management team.  She will not discharge today.  She is on multiple IV antiepileptics.  At this time severity of illness supports inpatient status.  Will await recommendations from UM team.   Consultants:   Neurology  Procedures:   EEG  Antimicrobials:   Rocephin    Subjective: Seen and examined Very sleepy but does respond upon repeat prompting and noxious stimulus Unable to provide any history  Objective: Vitals:   08/19/19 0400 08/19/19 0500 08/19/19 0600 08/19/19 1200  BP: 92/65 (!) 110/47 (!) 91/53 (!) 101/58  Pulse: 86 86 84 88  Resp: 18 17 14    Temp: 97.7 F (36.5 C) (!) 97 F (36.1 C) (!) 97.2 F (36.2 C)  TempSrc: Oral     SpO2: 100% 100% 100% 99%  Weight:      Height:        Intake/Output Summary (Last 24 hours) at 08/19/2019 1309 Last data filed at 08/19/2019 0600 Gross per 24 hour  Intake 247.95 ml  Output 635 ml  Net -387.05 ml   Filed Weights   08/18/19 1341 08/18/19 1822  Weight: (!) 140.4  kg 135.7 kg    Examination:  General exam: Not in distress, lethargic Respiratory system: Decreased at bases, normal work of breathing Cardiovascular system: S1-S2 heard, no murmurs, no pedal edema  gastrointestinal system: Obese, nondistended, nontender.  Positive bowel sounds Central nervous system: Unable to assess Extremities: Unable to assess Skin: No rashes, lesions or ulcers Psychiatry: Unable to assess    Data Reviewed: I have personally reviewed following labs and imaging studies  CBC: Recent Labs  Lab 08/18/19 1337 08/19/19 0624  WBC 9.2 8.4  HGB 11.4* 10.9*  HCT 36.8 34.8*  MCV 86.6 86.4  PLT 305 270   Basic Metabolic Panel: Recent Labs  Lab 08/18/19 1337 08/19/19 0624  NA 138 140  K 4.2 4.6  CL 102 106  CO2 26 26  GLUCOSE 108* 112*  BUN 14 15  CREATININE 0.52 0.51  CALCIUM 9.2 9.1   GFR: Estimated Creatinine Clearance: 88.8 mL/min (by C-G formula based on SCr of 0.51 mg/dL). Liver Function Tests: Recent Labs  Lab 08/18/19 1337  AST 14*  ALT 15  ALKPHOS 225*  BILITOT 0.3  PROT 8.8*  ALBUMIN 2.9*   No results for input(s): LIPASE, AMYLASE in the last 168 hours. No results for input(s): AMMONIA in the last 168 hours. Coagulation Profile: Recent Labs  Lab 08/18/19 1548  INR 1.0   Cardiac Enzymes: No results for input(s): CKTOTAL, CKMB, CKMBINDEX, TROPONINI in the last 168 hours. BNP (last 3 results) No results for input(s): PROBNP in the last 8760 hours. HbA1C: No results for input(s): HGBA1C in the last 72 hours. CBG: Recent Labs  Lab 08/18/19 1814 08/19/19 0736  GLUCAP 99 88   Lipid Profile: No results for input(s): CHOL, HDL, LDLCALC, TRIG, CHOLHDL, LDLDIRECT in the last 72 hours. Thyroid Function Tests: No results for input(s): TSH, T4TOTAL, FREET4, T3FREE, THYROIDAB in the last 72 hours. Anemia Panel: No results for input(s): VITAMINB12, FOLATE, FERRITIN, TIBC, IRON, RETICCTPCT in the last 72 hours. Sepsis Labs: Recent  Labs  Lab 08/18/19 1337 08/18/19 1659  LATICACIDVEN 1.9 1.0    Recent Results (from the past 240 hour(s))  Respiratory Panel by RT PCR (Flu A&B, Covid) - Nasopharyngeal Swab     Status: None   Collection Time: 08/18/19  4:59 PM   Specimen: Nasopharyngeal Swab  Result Value Ref Range Status   SARS Coronavirus 2 by RT PCR NEGATIVE NEGATIVE Final    Comment: (NOTE) SARS-CoV-2 target nucleic acids are NOT DETECTED. The SARS-CoV-2 RNA is generally detectable in upper respiratoy specimens during the acute phase of infection. The lowest concentration of SARS-CoV-2 viral copies this assay can detect is 131 copies/mL. A negative result does not preclude SARS-Cov-2 infection and should not be used as the sole basis for treatment or other patient management decisions. A negative result may occur with  improper specimen collection/handling, submission of specimen other than nasopharyngeal swab, presence of viral mutation(s) within the areas targeted by this assay, and inadequate number of viral copies (<131 copies/mL). A negative result must be combined with clinical observations, patient history, and epidemiological information. The expected result  is Negative. Fact Sheet for Patients:  PinkCheek.be Fact Sheet for Healthcare Providers:  GravelBags.it This test is not yet ap proved or cleared by the [place] FDA and  has been authorized for detection and/or diagnosis of SARS-CoV-2 by FDA under an Emergency Use Authorization (EUA). This EUA will remain  in effect (meaning this test can be used) for the duration of the COVID-19 declaration under Section 564(b)(1) of the Act, 21 U.S.C. section 360bbb-3(b)(1), unless the authorization is terminated or revoked sooner.    Influenza A by PCR NEGATIVE NEGATIVE Final   Influenza B by PCR NEGATIVE NEGATIVE Final    Comment: (NOTE) The Xpert Xpress SARS-CoV-2/FLU/RSV assay is intended  as an aid in  the diagnosis of influenza from Nasopharyngeal swab specimens and  should not be used as a sole basis for treatment. Nasal washings and  aspirates are unacceptable for Xpert Xpress SARS-CoV-2/FLU/RSV  testing. Fact Sheet for Patients: PinkCheek.be Fact Sheet for Healthcare Providers: GravelBags.it This test is not yet approved or cleared by the [place] FDA and  has been authorized for detection and/or diagnosis of SARS-CoV-2 by  FDA under an Emergency Use Authorization (EUA). This EUA will remain  in effect (meaning this test can be used) for the duration of the  Covid-19 declaration under Section 564(b)(1) of the Act, 21  U.S.C. section 360bbb-3(b)(1), unless the authorization is  terminated or revoked. Performed at [facility], [address] [place], [place] [address]   MRSA PCR Screening     Status: None   Collection Time: 08/18/19  6:31 PM   Specimen: Nasopharyngeal  Result Value Ref Range Status   MRSA by PCR NEGATIVE NEGATIVE Final    Comment:        The GeneXpert MRSA Assay (FDA approved for NASAL specimens only), is one component of a comprehensive MRSA colonization surveillance program. It is not intended to diagnose MRSA infection nor to guide or monitor treatment for MRSA infections. Performed at [facility], [address] [place], [place] [address]   CULTURE, BLOOD (ROUTINE X 2) w Reflex to ID Panel     Status: None (Preliminary result)   Collection Time: 08/18/19  6:59 PM   Specimen: BLOOD LEFT FOREARM  Result Value Ref Range Status   Specimen Description BLOOD LEFT FOREARM  Final   Special Requests   Final    BOTTLES DRAWN AEROBIC AND ANAEROBIC Blood Culture adequate volume   Culture   Final    NO GROWTH < 12 HOURS Performed at [facility], [address] [place], [place] [address]    Report Status PENDING  Incomplete  CULTURE, BLOOD (ROUTINE  X 2) w Reflex to ID Panel     Status: None (Preliminary result)   Collection Time: 08/18/19  7:36 PM   Specimen: BLOOD  Result Value Ref Range Status   Specimen Description BLOOD LEFT AC  Final   Special Requests   Final    BOTTLES DRAWN AEROBIC AND ANAEROBIC Blood Culture results may not be optimal due to an inadequate volume of blood received in culture bottles   Culture   Final    NO GROWTH < 12 HOURS Performed at [facility], [address], [place], [place] [address]    Report Status PENDING  Incomplete         Radiology Studies: CT Head Wo Contrast  Result Date: 08/18/2019 CLINICAL DATA:  Seizure. EXAM: CT HEAD WITHOUT CONTRAST TECHNIQUE: Contiguous axial images were obtained from the  base of the skull through the vertex without intravenous contrast. COMPARISON:  July 06, 2019 FINDINGS: Brain: There is mild cerebral atrophy with widening of the extra-axial spaces and ventricular dilatation. There are areas of decreased attenuation within the white matter tracts of the supratentorial brain, consistent with microvascular disease changes. Vascular: No hyperdense vessel or unexpected calcification. Skull: Negative for fracture or focal lesion. Stable, diffuse thickening of the calvarium is noted. Sinuses/Orbits: There is mild bilateral ethmoid sinus mucosal thickening. Other: None. IMPRESSION: No acute intracranial pathology. Electronically Signed   By: Aram Candela M.D.   On: 08/18/2019 15:59   DG Chest Portable 1 View  Result Date: 08/18/2019 CLINICAL DATA:  Hypoxia EXAM: PORTABLE CHEST 1 VIEW COMPARISON:  None. FINDINGS: Endotracheal tube tip is 2.0 cm above the carina. Nasogastric tube tip and side port are below the diaphragm. No pneumothorax. There is a small left pleural effusion. There is atelectatic change in the right mid lung and bibasilar regions. No airspace consolidation. Heart is mildly enlarged with pulmonary vascularity normal. No adenopathy. No bone  lesions. IMPRESSION: Tube positions as described without pneumothorax. Small left pleural effusion with bibasilar and right midlung atelectasis. No consolidation. Mild cardiomegaly. Electronically Signed   By: Bretta Bang III M.D.   On: 08/18/2019 14:11   DG Abd Portable 1 View  Result Date: 08/18/2019 CLINICAL DATA:  Orogastric tube placement EXAM: PORTABLE ABDOMEN - 1 VIEW COMPARISON:  None. FINDINGS: Orogastric tube tip and side port in stomach. No bowel dilatation or air-fluid level to suggest bowel obstruction. No free air. Moderate stool in colon. IMPRESSION: Orogastric tube tip and side port in stomach. No bowel obstruction or free air evident. Electronically Signed   By: Bretta Bang III M.D.   On: 08/18/2019 14:11        Scheduled Meds: . Chlorhexidine Gluconate Cloth  6 each Topical Daily  . enoxaparin (LOVENOX) injection  40 mg Subcutaneous Q12H  . mouth rinse  15 mL Mouth Rinse BID  . PHENObarbital  27.3 mg Intravenous TID  . phenytoin (DILANTIN) IV  100 mg Intravenous Q8H   Continuous Infusions: . cefTRIAXone (ROCEPHIN)  IV 2 g (08/19/19 1051)  . levETIRAcetam 1,500 mg (08/19/19 1046)     LOS: 0 days    Time spent: 35 minutes    Tresa Moore, MD [facility] Pager [phone]  If 7PM-7AM, please contact night-coverage 08/19/2019, 1:09 PM

## 2019-08-19 NOTE — Progress Notes (Signed)
Trasferred to room 229. Report given. Patient in the room with family at bedside. Chart and meds left at nurses station. Spoke with nurse providing care to let her know patient was in room. Connected to 2 liters [place] of oxygen prior to leaving the room.

## 2019-08-19 NOTE — Progress Notes (Signed)
Transferred to floor. Family at bedside who state patient at baseline cognition. Patient alert to voice but falls asleep without stimuli. Patient hard to understand due to slurred speech which family states is her normal. Patient able to follow simple commands. Oriented to room and call bell.

## 2019-08-19 NOTE — Progress Notes (Signed)
eeg completed ° °

## 2019-08-19 NOTE — Consult Note (Signed)
Reason for Consult: seizure activity  Requesting Physician: Dr. Marcos Eke   CC: seizure activity    HPI: Mariah Jimenez is an 74 y.o. female with medical history significant of seizure, mental retardation, who presents with seizure. Per her sister, pt had seizure which lasted for about 40 min. Pt was treated with 6 mg Versed by EMS. Pt was nonresponsive and and was intubated in ED. Patient was found to have UTI. Unclear if compliant with medications at facility.  Pt s/p self extubation yesterday.    Past Medical History:  Diagnosis Date  . Mental retardation   . Seizures (HCC)     Past Surgical History:  Procedure Laterality Date  . NO PAST SURGERIES    . TIBIA IM NAIL INSERTION Right 04/09/2016   Procedure: INTRAMEDULLARY (IM) NAIL TIBIAL;  Surgeon: Christena Flake, MD;  Location: [facility] ORS;  Service: Orthopedics;  Laterality: Right;    Family History  Family history unknown: Yes    Social History:  reports that she has never smoked. She has never used smokeless tobacco. She reports that she does not drink alcohol or use drugs.  No Known Allergies  Medications: I have reviewed the patient's current medications.  ROS: Unable to obtain as confused   Physical Examination: Blood pressure (!) 91/53, pulse 84, temperature (!) 97.2 F (36.2 C), resp. rate 14, height 5\' 5"  (1.651 m), weight 135.7 kg, SpO2 100 %.    Neurological Examination   Mental Status: Alert to name only  Cranial Nerves: II: Discs flat bilaterally; V,VII enlarged tongue and lip which is chronic.  VIII: hearing normal bilaterally Motor: Generalized weakness bilaterally which  Sensory: Not tested.  Deep Tendon Reflexes: 1+ and symmetric throughout Plantars: Right: downgoing   Left: downgoing Cerebellar:     Laboratory Studies:   Basic Metabolic Panel: Recent Labs  Lab 08/18/19 1337 08/19/19 0624  NA 138 140  K 4.2 4.6  CL 102 106  CO2 26 26  GLUCOSE 108* 112*  BUN 14 15  CREATININE 0.52  0.51  CALCIUM 9.2 9.1    Liver Function Tests: Recent Labs  Lab 08/18/19 1337  AST 14*  ALT 15  ALKPHOS 225*  BILITOT 0.3  PROT 8.8*  ALBUMIN 2.9*   No results for input(s): LIPASE, AMYLASE in the last 168 hours. No results for input(s): AMMONIA in the last 168 hours.  CBC: Recent Labs  Lab 08/18/19 1337 08/19/19 0624  WBC 9.2 8.4  HGB 11.4* 10.9*  HCT 36.8 34.8*  MCV 86.6 86.4  PLT 305 270    Cardiac Enzymes: No results for input(s): CKTOTAL, CKMB, CKMBINDEX, TROPONINI in the last 168 hours.  BNP: Invalid input(s): POCBNP  CBG: Recent Labs  Lab 08/18/19 1814 08/19/19 0736  GLUCAP 99 88    Microbiology: Results for orders placed or performed during the hospital encounter of 08/18/19  Respiratory Panel by RT PCR (Flu A&B, Covid) - Nasopharyngeal Swab     Status: None   Collection Time: 08/18/19  4:59 PM   Specimen: Nasopharyngeal Swab  Result Value Ref Range Status   SARS Coronavirus 2 by RT PCR NEGATIVE NEGATIVE Final    Comment: (NOTE) SARS-CoV-2 target nucleic acids are NOT DETECTED. The SARS-CoV-2 RNA is generally detectable in upper respiratoy specimens during the acute phase of infection. The lowest concentration of SARS-CoV-2 viral copies this assay can detect is 131 copies/mL. A negative result does not preclude SARS-Cov-2 infection and should not be used as the sole basis for treatment or other  patient management decisions. A negative result may occur with  improper specimen collection/handling, submission of specimen other than nasopharyngeal swab, presence of viral mutation(s) within the areas targeted by this assay, and inadequate number of viral copies (<131 copies/mL). A negative result must be combined with clinical observations, patient history, and epidemiological information. The expected result is Negative. Fact Sheet for Patients:  PinkCheek.be Fact Sheet for Healthcare Providers:   GravelBags.it This test is not yet ap proved or cleared by the [place] FDA and  has been authorized for detection and/or diagnosis of SARS-CoV-2 by FDA under an Emergency Use Authorization (EUA). This EUA will remain  in effect (meaning this test can be used) for the duration of the COVID-19 declaration under Section 564(b)(1) of the Act, 21 U.S.C. section 360bbb-3(b)(1), unless the authorization is terminated or revoked sooner.    Influenza A by PCR NEGATIVE NEGATIVE Final   Influenza B by PCR NEGATIVE NEGATIVE Final    Comment: (NOTE) The Xpert Xpress SARS-CoV-2/FLU/RSV assay is intended as an aid in  the diagnosis of influenza from Nasopharyngeal swab specimens and  should not be used as a sole basis for treatment. Nasal washings and  aspirates are unacceptable for Xpert Xpress SARS-CoV-2/FLU/RSV  testing. Fact Sheet for Patients: PinkCheek.be Fact Sheet for Healthcare Providers: GravelBags.it This test is not yet approved or cleared by the [place] FDA and  has been authorized for detection and/or diagnosis of SARS-CoV-2 by  FDA under an Emergency Use Authorization (EUA). This EUA will remain  in effect (meaning this test can be used) for the duration of the  Covid-19 declaration under Section 564(b)(1) of the Act, 21  U.S.C. section 360bbb-3(b)(1), unless the authorization is  terminated or revoked. Performed at [facility], [address] [place], [place] [address]   MRSA PCR Screening     Status: None   Collection Time: 08/18/19  6:31 PM   Specimen: Nasopharyngeal  Result Value Ref Range Status   MRSA by PCR NEGATIVE NEGATIVE Final    Comment:        The GeneXpert MRSA Assay (FDA approved for NASAL specimens only), is one component of a comprehensive MRSA colonization surveillance program. It is not intended to diagnose MRSA infection nor to guide or monitor  treatment for MRSA infections. Performed at [facility], [address] [place], [place] [address]   CULTURE, BLOOD (ROUTINE X 2) w Reflex to ID Panel     Status: None (Preliminary result)   Collection Time: 08/18/19  6:59 PM   Specimen: BLOOD LEFT FOREARM  Result Value Ref Range Status   Specimen Description BLOOD LEFT FOREARM  Final   Special Requests   Final    BOTTLES DRAWN AEROBIC AND ANAEROBIC Blood Culture adequate volume   Culture   Final    NO GROWTH < 12 HOURS Performed at [facility], [address] [place], [place] [address]    Report Status PENDING  Incomplete  CULTURE, BLOOD (ROUTINE X 2) w Reflex to ID Panel     Status: None (Preliminary result)   Collection Time: 08/18/19  7:36 PM   Specimen: BLOOD  Result Value Ref Range Status   Specimen Description BLOOD LEFT AC  Final   Special Requests   Final    BOTTLES DRAWN AEROBIC AND ANAEROBIC Blood Culture results may not be optimal due to an inadequate volume of blood received in culture bottles   Culture   Final    NO GROWTH < 12  HOURS Performed at [facility], [address] [place], [place] [address]    Report Status PENDING  Incomplete    Coagulation Studies: Recent Labs    08/18/19 1548  LABPROT 13.1  INR 1.0    Urinalysis:  Recent Labs  Lab 08/18/19 1412  COLORURINE AMBER*  LABSPEC 1.012  PHURINE 9.0*  GLUCOSEU NEGATIVE  HGBUR NEGATIVE  BILIRUBINUR NEGATIVE  KETONESUR NEGATIVE  PROTEINUR 100*  NITRITE NEGATIVE  LEUKOCYTESUR MODERATE*    Lipid Panel:  No results found for: CHOL, TRIG, HDL, CHOLHDL, VLDL, LDLCALC  HgbA1C:  Lab Results  Component Value Date   HGBA1C 5.2 04/09/2016    Urine Drug Screen:  No results found for: LABOPIA, COCAINSCRNUR, LABBENZ, AMPHETMU, THCU, LABBARB  Alcohol Level: No results for input(s): ETH in the last 168 hours.  Other results: EKG: normal EKG, normal sinus rhythm, unchanged from previous tracings.  Imaging: CT  Head Wo Contrast  Result Date: 08/18/2019 CLINICAL DATA:  Seizure. EXAM: CT HEAD WITHOUT CONTRAST TECHNIQUE: Contiguous axial images were obtained from the base of the skull through the vertex without intravenous contrast. COMPARISON:  July 06, 2019 FINDINGS: Brain: There is mild cerebral atrophy with widening of the extra-axial spaces and ventricular dilatation. There are areas of decreased attenuation within the white matter tracts of the supratentorial brain, consistent with microvascular disease changes. Vascular: No hyperdense vessel or unexpected calcification. Skull: Negative for fracture or focal lesion. Stable, diffuse thickening of the calvarium is noted. Sinuses/Orbits: There is mild bilateral ethmoid sinus mucosal thickening. Other: None. IMPRESSION: No acute intracranial pathology. Electronically Signed   By: Aram Candela M.D.   On: 08/18/2019 15:59   DG Chest Portable 1 View  Result Date: 08/18/2019 CLINICAL DATA:  Hypoxia EXAM: PORTABLE CHEST 1 VIEW COMPARISON:  None. FINDINGS: Endotracheal tube tip is 2.0 cm above the carina. Nasogastric tube tip and side port are below the diaphragm. No pneumothorax. There is a small left pleural effusion. There is atelectatic change in the right mid lung and bibasilar regions. No airspace consolidation. Heart is mildly enlarged with pulmonary vascularity normal. No adenopathy. No bone lesions. IMPRESSION: Tube positions as described without pneumothorax. Small left pleural effusion with bibasilar and right midlung atelectasis. No consolidation. Mild cardiomegaly. Electronically Signed   By: Bretta Bang III M.D.   On: 08/18/2019 14:11   DG Abd Portable 1 View  Result Date: 08/18/2019 CLINICAL DATA:  Orogastric tube placement EXAM: PORTABLE ABDOMEN - 1 VIEW COMPARISON:  None. FINDINGS: Orogastric tube tip and side port in stomach. No bowel dilatation or air-fluid level to suggest bowel obstruction. No free air. Moderate stool in colon. IMPRESSION:  Orogastric tube tip and side port in stomach. No bowel obstruction or free air evident. Electronically Signed   By: Bretta Bang III M.D.   On: 08/18/2019 14:11     Assessment/Plan:  74 y.o. female with medical history significant of seizure, mental retardation, who presents with seizure. Per her sister, pt had seizure which lasted for about 40 min. Pt was treated with 6 mg Versed by EMS. Pt was nonresponsive and and was intubated in ED. Patient was found to have UTI. Unclear if compliant with medications at facility.  Pt s/p self extubation yesterday.    - Con't Keppra, Phenobarbital, Phenytoin  - EEG reviewed no epileptiform discharges - unclear how far off the baseline she is - UTI treatment.  08/19/2019, 12:21 PM

## 2019-08-20 DIAGNOSIS — R569 Unspecified convulsions: Secondary | ICD-10-CM | POA: Diagnosis not present

## 2019-08-20 DIAGNOSIS — J9601 Acute respiratory failure with hypoxia: Secondary | ICD-10-CM | POA: Diagnosis not present

## 2019-08-20 LAB — URINE CULTURE

## 2019-08-20 LAB — GLUCOSE, CAPILLARY: Glucose-Capillary: 78 mg/dL (ref 70–99)

## 2019-08-20 MED ORDER — PHENYTOIN SODIUM EXTENDED 100 MG PO CAPS
300.0000 mg | ORAL_CAPSULE | Freq: Every day | ORAL | Status: DC
  Administered 2019-08-20: 22:00:00 300 mg via ORAL
  Filled 2019-08-20 (×2): qty 3

## 2019-08-20 MED ORDER — LEVETIRACETAM 500 MG PO TABS
1500.0000 mg | ORAL_TABLET | Freq: Two times a day (BID) | ORAL | Status: DC
  Administered 2019-08-20 – 2019-08-21 (×2): 1500 mg via ORAL
  Filled 2019-08-20 (×2): qty 3

## 2019-08-20 MED ORDER — PHENOBARBITAL 32.4 MG PO TABS
48.6000 mg | ORAL_TABLET | Freq: Three times a day (TID) | ORAL | Status: DC
  Administered 2019-08-20 – 2019-08-21 (×3): 48.6 mg via ORAL
  Filled 2019-08-20 (×3): qty 2

## 2019-08-20 NOTE — TOC Initial Note (Signed)
Transition of Care [facility]) - Initial/Assessment Note    Patient Details  Name: Mariah Jimenez MRN: [id number] Date of Birth: [birth year]  Transition of [facility]) CM/SW Contact:    Chapman Fitch, RN Phone Number: 08/20/2019, 2:13 PM  Clinical Narrative:                 Patient admitted from [facility].  RNCM confirmed with Tresa Endo at [facility] that patient is long term care, and has resided there since 2018  RNCM confirmed with patient's sister that the plan is for patient to return at discharge  Sister [name] had some questions regarding transport of patient to any follow up appointments post discharge.  Tresa Endo from [facility] to call [name] direclty to follow up with her questions.   Expected Discharge Plan: Long Term Nursing Home Barriers to Discharge: Continued Medical Work up   Patient Goals and CMS Choice        Expected Discharge Plan and Services Expected Discharge Plan: Long Term Nursing Home       Living arrangements for the past 2 months: Skilled Nursing Facility                                      Prior Living Arrangements/Services Living arrangements for the past 2 months: Skilled Nursing Facility Lives with:: Facility Resident   Do you feel safe going back to the place where you live?: Yes               Activities of Daily Living      Permission Sought/Granted                  Emotional Assessment              Admission diagnosis:  Lower urinary tract infectious disease [N39.0] Seizure (HCC) [R56.9] Altered mental status, unspecified altered mental status type [R41.82] Patient Active Problem List   Diagnosis Date Noted  . Seizure (HCC) 08/18/2019  . UTI (urinary tract infection) 08/18/2019  . Hypothermia 08/18/2019  . Acute respiratory failure with hypoxia (HCC) 08/18/2019  . Palliative care encounter 10/06/2018  . Debility 10/06/2018  . Tibia/fibula fracture 04/09/2016   PCP:  Randell Loop,  [facility]:   [place] [facility] [place], [place] - [address] [place] [address] Phone: [phone] Fax: [phone]     Social Determinants of Health (SDOH) Interventions    Readmission Risk Interventions No flowsheet data found.

## 2019-08-20 NOTE — Progress Notes (Signed)
Foley removed at 1130

## 2019-08-20 NOTE — Evaluation (Signed)
Occupational Therapy Evaluation Patient Details Name: Mariah Jimenez MRN: [id number] DOB: [birth year] Today's Date: 08/20/2019    History of Present Illness Mariah Jimenez is a 74 y.o. female with medical history significant of seizure, mental retardation, who presents from [facility] with a seizure. Pt was intubated in the ED d/t decreased responsiveness but self-extubated. Per chart, at baseline pt produces a "uh" sound that varies slightly in prosody to communicate. There aren't any discernable yes/no. Per chart, sister confirms that pt's current mentation is at baseline along with some baseline facial differences such a large tongue and large bottom lip. No acute intracranial pathology was evident in head CT and chest x-ray revealed small left pleural effusion with bibasilar and right midlung atelectasis, no consolidation, ild cardiomegaly.   Clinical Impression   [name] was seen for OT evaluation this date. Prior to hospital admission, pt was in [place] at [facility] where pt is reportedly bed bound at baseline. Pt presents to acute OT demonstrating impaired ADL performance and functional mobility 2/2 BUE edema, decreased functional strength/endurance, and fine motor coordination deficits. Pt currently requires SETUP face washing at bed level. LUE: PROM WFL; shoulder/elbow flexion 3-/5; unable to fully extend digits passively. RUE: AROM WFL grossly, shoulder/elbow flexion 4/5. Pt would benefit from skilled OT to address noted impairments and functional limitations (see below for any additional details) in order to maximize safety and independence and maintain functional strength. Upon hospital discharge, recommend return to [facility].     Follow Up Recommendations  LTACH(d/c back to [facility])    Equipment Recommendations  None recommended by OT    Recommendations for Other Services       Precautions / Restrictions Precautions Precautions: None Restrictions Weight Bearing  Restrictions: No      Mobility Bed Mobility               General bed mobility comments: Per CHL pt is bed bound at baseline. Unclear if pt participates in rolling - will require +2 to attempt  Transfers                      Balance                                           ADL either performed or assessed with clinical judgement   ADL Overall ADL's : Needs assistance/impaired                                       General ADL Comments: SETUP face washing at bed level.      Vision         Perception     Praxis      Pertinent Vitals/Pain Pain Assessment: Faces Faces Pain Scale: No hurt     Hand Dominance Right   Extremity/Trunk Assessment Upper Extremity Assessment Upper Extremity Assessment: RUE deficits/detail;LUE deficits/detail RUE Deficits / Details: AROM WFL grossly. Shoulder flexion 4/5 LUE Deficits / Details: PROM WFL. Shoulder flexion 3-/5. Unable to fully extend digits passively   Lower Extremity Assessment Lower Extremity Assessment: Difficult to assess due to impaired cognition(bed bound at baseline)       Communication     Cognition Arousal/Alertness: Lethargic   Overall Cognitive Status: History of cognitive impairments - at baseline  General Comments: Pt awakes to touch and able to imitate OT for BUE mobility.    General Comments  Moderate edema noted to BUE    Exercises Exercises: Other exercises Other Exercises Other Exercises: Face washing, BUE A/PROM, positioning for edema    Shoulder Instructions      Home Living Family/patient expects to be discharged to:: Other (Comment)(LTAC - [facility])                                        Prior Functioning/Environment Level of Independence: Needs assistance        Comments: Per CHL pt is at baseline mentation and bed bound at baseline.         OT Problem List:  Decreased strength;Decreased range of motion;Impaired UE functional use      OT Treatment/Interventions: Therapeutic exercise;Self-care/ADL training;Therapeutic activities;Patient/family education    OT Goals(Current goals can be found in the care plan section) Acute Rehab OT Goals Patient Stated Goal: Unable to state OT Goal Formulation: Patient unable to participate in goal setting Time For Goal Achievement: 09/03/19 Potential to Achieve Goals: Fair ADL Goals Pt Will Perform Eating: with caregiver independent in assisting;with min assist;bed level Pt Will Perform Upper Body Bathing: with set-up;with min assist;bed level Pt Will Transfer to Toilet: with max assist;with +2 assist(at bed level)  OT Frequency: Min 1X/week   Barriers to D/C:            Co-evaluation              AM-PAC OT "6 Clicks" Daily Activity     Outcome Measure Help from another person eating meals?: A Little Help from another person taking care of personal grooming?: A Lot Help from another person toileting, which includes using toliet, bedpan, or urinal?: Total Help from another person bathing (including washing, rinsing, drying)?: Total Help from another person to put on and taking off regular upper body clothing?: A Lot Help from another person to put on and taking off regular lower body clothing?: Total 6 Click Score: 10   End of Session Nurse Communication: Other (comment)(Pt performs better mimicing gestures and c object prompting)  Activity Tolerance: Patient tolerated treatment well Patient left: in bed;with call bell/phone within reach;with bed alarm set  OT Visit Diagnosis: Muscle weakness (generalized) (M62.81)                Time: 9767-3419 OT Time Calculation (min): 7 min Charges:  OT General Charges $OT Visit: 1 Visit OT Evaluation $OT Eval Low Complexity: 1 Low  Kathie Dike, M.S. OTR/L  08/20/19, 3:53 PM

## 2019-08-20 NOTE — Progress Notes (Signed)
PT Cancellation Note  Patient Details Name: Mariah Jimenez MRN: [id number] DOB: February 02, 1946   Cancelled Treatment:    Reason Eval/Treat Not Completed: Other (comment).  PT consult received.  Chart reviewed.  Pt's nurse and care manager report that pt's family member told them that pt was bed bound.  Therapist called [facility] to verify pt's functional mobility status and spoke with Tresa Endo who confirmed that pt was completely bed bound (does not use hoyer lift or sit to stand lift to get OOB to chair).  Pt has been LTC at [facility] since 2018.  D/t pt being bed bound at baseline; no acute PT needs identified.  Will sign off.  Hendricks Limes, PT 08/20/19, 2:03 PM

## 2019-08-20 NOTE — Progress Notes (Signed)
Subjective: Per nursing no further seizures noted.  Patient now at baseline.    Objective: Current vital signs: BP 115/61 (BP Location: Left Wrist)   Pulse 90   Temp 97.8 F (36.6 C) (Oral)   Resp 16   Ht 5\' 5"  (1.651 m)   Wt 135.7 kg   SpO2 100%   BMI 49.78 kg/m  Vital signs in last 24 hours: Temp:  [97.4 F (36.3 C)-98.1 F (36.7 C)] 97.8 F (36.6 C) (05/10 0419) Pulse Rate:  [85-90] 90 (05/10 0419) Resp:  [16-24] 16 (05/10 0419) BP: (87-117)/(46-74) 115/61 (05/10 0419) SpO2:  [99 %-100 %] 100 % (05/10 0041)  Intake/Output from previous day: 05/09 0701 - 05/10 0700 In: 0  Out: 900 [Urine:900] Intake/Output this shift: Total I/O In: 240 [P.O.:240] Out: -  Nutritional status:  Diet Order            DIET DYS 3 Room service appropriate? Yes; Fluid consistency: Thin  Diet effective now              Neurologic Exam: Mental Status: Patient awake and alert.  Follows simple commands.  Speech dysarthric.   Cranial Nerves: II: Blinks to bilateral confrontation.   III,IV, VI: Extra-ocular motions grossly intact bilaterally V,VII: smile symmetric VIII: hearing normal bilaterally IX,X: gag reflex present XI: bilateral shoulder shrug XII: midline tongue extension Motor: Moves all extremities weakly with no focal weakness appreciated Sensory: Pinprick and light touch intact throughout, bilaterally   Lab Results: Basic Metabolic Panel: Recent Labs  Lab 08/18/19 1337 08/19/19 0624  NA 138 140  K 4.2 4.6  CL 102 106  CO2 26 26  GLUCOSE 108* 112*  BUN 14 15  CREATININE 0.52 0.51  CALCIUM 9.2 9.1    Liver Function Tests: Recent Labs  Lab 08/18/19 1337  AST 14*  ALT 15  ALKPHOS 225*  BILITOT 0.3  PROT 8.8*  ALBUMIN 2.9*   No results for input(s): LIPASE, AMYLASE in the last 168 hours. No results for input(s): AMMONIA in the last 168 hours.  CBC: Recent Labs  Lab 08/18/19 1337 08/19/19 0624  WBC 9.2 8.4  HGB 11.4* 10.9*  HCT 36.8 34.8*  MCV  86.6 86.4  PLT 305 270    Cardiac Enzymes: No results for input(s): CKTOTAL, CKMB, CKMBINDEX, TROPONINI in the last 168 hours.  Lipid Panel: No results for input(s): CHOL, TRIG, HDL, CHOLHDL, VLDL, LDLCALC in the last 168 hours.  CBG: Recent Labs  Lab 08/18/19 1814 08/19/19 0736 08/20/19 0739  GLUCAP 99 88 78    Microbiology: Results for orders placed or performed during the hospital encounter of 08/18/19  Urine Culture     Status: Abnormal   Collection Time: 08/18/19  2:12 PM   Specimen: Urine, Random  Result Value Ref Range Status   Specimen Description   Final    URINE, RANDOM Performed at [facility], [address], [place], [place]    Special Requests   Final    NONE Performed at [facility], [address] [place], [place]    Culture MULTIPLE SPECIES PRESENT, SUGGEST RECOLLECTION (A)  Final   Report Status 08/20/2019 FINAL  Final  Respiratory Panel by RT PCR (Flu A&B, Covid) - Nasopharyngeal Swab     Status: None   Collection Time: 08/18/19  4:59 PM   Specimen: Nasopharyngeal Swab  Result Value Ref Range Status   SARS Coronavirus 2 by RT PCR NEGATIVE NEGATIVE Final    Comment: (NOTE)  SARS-CoV-2 target nucleic acids are NOT DETECTED. The SARS-CoV-2 RNA is generally detectable in upper respiratoy specimens during the acute phase of infection. The lowest concentration of SARS-CoV-2 viral copies this assay can detect is 131 copies/mL. A negative result does not preclude SARS-Cov-2 infection and should not be used as the sole basis for treatment or other patient management decisions. A negative result may occur with  improper specimen collection/handling, submission of specimen other than nasopharyngeal swab, presence of viral mutation(s) within the areas targeted by this assay, and inadequate number of viral copies (<131 copies/mL). A negative result must be combined with clinical observations, patient history, and  epidemiological information. The expected result is Negative. Fact Sheet for Patients:  PinkCheek.be Fact Sheet for Healthcare Providers:  GravelBags.it This test is not yet ap proved or cleared by the [place] FDA and  has been authorized for detection and/or diagnosis of SARS-CoV-2 by FDA under an Emergency Use Authorization (EUA). This EUA will remain  in effect (meaning this test can be used) for the duration of the COVID-19 declaration under Section 564(b)(1) of the Act, 21 U.S.C. section 360bbb-3(b)(1), unless the authorization is terminated or revoked sooner.    Influenza A by PCR NEGATIVE NEGATIVE Final   Influenza B by PCR NEGATIVE NEGATIVE Final    Comment: (NOTE) The Xpert Xpress SARS-CoV-2/FLU/RSV assay is intended as an aid in  the diagnosis of influenza from Nasopharyngeal swab specimens and  should not be used as a sole basis for treatment. Nasal washings and  aspirates are unacceptable for Xpert Xpress SARS-CoV-2/FLU/RSV  testing. Fact Sheet for Patients: PinkCheek.be Fact Sheet for Healthcare Providers: GravelBags.it This test is not yet approved or cleared by the [place] FDA and  has been authorized for detection and/or diagnosis of SARS-CoV-2 by  FDA under an Emergency Use Authorization (EUA). This EUA will remain  in effect (meaning this test can be used) for the duration of the  Covid-19 declaration under Section 564(b)(1) of the Act, 21  U.S.C. section 360bbb-3(b)(1), unless the authorization is  terminated or revoked. Performed at [facility], [address] [place], [place] [address]   MRSA PCR Screening     Status: None   Collection Time: 08/18/19  6:31 PM   Specimen: Nasopharyngeal  Result Value Ref Range Status   MRSA by PCR NEGATIVE NEGATIVE Final    Comment:        The GeneXpert MRSA Assay (FDA approved for  NASAL specimens only), is one component of a comprehensive MRSA colonization surveillance program. It is not intended to diagnose MRSA infection nor to guide or monitor treatment for MRSA infections. Performed at [facility], [address] [place], [place] [address]   CULTURE, BLOOD (ROUTINE X 2) w Reflex to ID Panel     Status: None (Preliminary result)   Collection Time: 08/18/19  6:59 PM   Specimen: BLOOD LEFT FOREARM  Result Value Ref Range Status   Specimen Description BLOOD LEFT FOREARM  Final   Special Requests   Final    BOTTLES DRAWN AEROBIC AND ANAEROBIC Blood Culture adequate volume   Culture   Final    NO GROWTH 2 DAYS Performed at [facility], [address] [place],  [address]    Report Status PENDING  Incomplete  CULTURE, BLOOD (ROUTINE X 2) w Reflex to ID Panel     Status: None (Preliminary result)   Collection Time: 08/18/19  7:36 PM   Specimen: BLOOD  Result Value Ref  Range Status   Specimen Description BLOOD LEFT AC  Final   Special Requests   Final    BOTTLES DRAWN AEROBIC AND ANAEROBIC Blood Culture results may not be optimal due to an inadequate volume of blood received in culture bottles   Culture   Final    NO GROWTH 2 DAYS Performed at [facility], [address] [place], [place] [address]    Report Status PENDING  Incomplete    Coagulation Studies: Recent Labs    08/18/19 1548  LABPROT 13.1  INR 1.0    Imaging: EEG  Result Date: 08/19/2019 Charlsie Quest, MD     08/19/2019  4:15 PM Patient Name: Mariah Jimenez MRN: [id number] Epilepsy Attending: Charlsie Quest Referring Physician/Provider: Dr Pauletta Browns Date: 08/19/2019 Duration: 24.33 mins Patient history: 74 y.o. female with medical history significant ofseizure, mental retardation, who presents with seizure. EEG to evaluate for seizure. Level of alertness: awake, asleep AEDs during EEG study: PHT, LEV, PB Technical aspects: This EEG study was  done with scalp electrodes positioned according to the 10-20 International system of electrode placement. Electrical activity was acquired at a sampling rate of 500Hz  and reviewed with a high frequency filter of 70Hz  and a low frequency filter of 1Hz . EEG data were recorded continuously and digitally stored. DESCRIPTION: The posterior dominant rhythm consists of 7 Hz activity of moderate voltage (25-35 uV) seen predominantly in posterior head regions, symmetric and reactive to eye opening and eye closing. Sleep was characterized by vertex waves, sleep spindles (12-14hz ), maximal frontocentral region. Generalized spikes and spike and wave were also noted, predominantly during sleep. Physiologic photic driving was not seen during photic stimulation.  Hyperventilation was not performed. 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 No seizures were seen throughout the recording.   CT Head Wo Contrast  Result Date: 08/18/2019 CLINICAL DATA:  Seizure. EXAM: CT HEAD WITHOUT CONTRAST TECHNIQUE: Contiguous axial images were obtained from the base of the skull through the vertex without intravenous contrast. COMPARISON:  July 06, 2019 FINDINGS: Brain: There is mild cerebral atrophy with widening of the extra-axial spaces and ventricular dilatation. There are areas of decreased attenuation within the white matter tracts of the supratentorial brain, consistent with microvascular disease changes. Vascular: No hyperdense vessel or unexpected calcification. Skull: Negative for fracture or focal lesion. Stable, diffuse thickening of the calvarium is noted. Sinuses/Orbits: There is mild bilateral ethmoid sinus mucosal thickening. Other: None. IMPRESSION: No acute intracranial pathology. Electronically Signed   By: Charlsie Quest M.D.   On: 08/18/2019 15:59   DG Chest Portable 1  View  Result Date: 08/18/2019 CLINICAL DATA:  Hypoxia EXAM: PORTABLE CHEST 1 VIEW COMPARISON:  None. FINDINGS: Endotracheal tube tip is 2.0 cm above the carina. Nasogastric tube tip and side port are below the diaphragm. No pneumothorax. There is a small left pleural effusion. There is atelectatic change in the right mid lung and bibasilar regions. No airspace consolidation. Heart is mildly enlarged with pulmonary vascularity normal. No adenopathy. No bone lesions. IMPRESSION: Tube positions as described without pneumothorax. Small left pleural effusion with bibasilar and right midlung atelectasis. No consolidation. Mild cardiomegaly. Electronically Signed   By: Aram Candela III M.D.   On: 08/18/2019 14:11   DG Abd Portable 1 View  Result Date: 08/18/2019 CLINICAL DATA:  Orogastric tube placement EXAM: PORTABLE ABDOMEN - 1 VIEW COMPARISON:  None. FINDINGS: Orogastric  tube tip and side port in stomach. No bowel dilatation or air-fluid level to suggest bowel obstruction. No free air. Moderate stool in colon. IMPRESSION: Orogastric tube tip and side port in stomach. No bowel obstruction or free air evident. Electronically Signed   By: Bretta Bang III M.D.   On: 08/18/2019 14:11    Medications:  I have reviewed the patient's current medications. Scheduled: . Chlorhexidine Gluconate Cloth  6 each Topical Daily  . enoxaparin (LOVENOX) injection  40 mg Subcutaneous Q12H  . mouth rinse  15 mL Mouth Rinse BID  . PHENObarbital  27.3 mg Intravenous TID  . phenytoin (DILANTIN) IV  100 mg Intravenous Q8H    Assessment/Plan: 74 y.o. female with medical history significant ofseizure, mental retardation, who presents with breakthrough seizure. Per her sister, pt had seizure which lasted for about 40 min. Pt was treated with 6 mg Versed by EMS. Pt wasnonresponsive andand was intubated in ED. Patient now extubated.  Found to have UTI which likely lowered seizure threshold.    Dilantin level 10.5.     Recommendations: 1. Would restart anticonvulsive therapy po with increase in Phenobarbital to 48.6mg  TID.  Would continue Keppra and Dilantin at preadmission doses of 1500mg  BID and 300mg  qhs respectively.   2. Seizure precautions.     LOS: 1 day   , MD Neurology [phone] 08/20/2019  12:29 PM

## 2019-08-20 NOTE — Progress Notes (Signed)
PROGRESS NOTE    Mariah Jimenez  WJX:[id number] DOB: [birth year] DOA: 08/18/2019 PCP: [facility] [place]   Brief Narrative:  HPI: Mariah Jimenez is a 74 y.o. female with medical history significant of seizure, mental retardation, who presents with seizure.  Per her sister, pt had seizure which lasted for about 40 min. Pt was treated with 6 mg Versed by EMS. Pt was nonresponsive and and was intubated in ED. Later on, patient self extubated.  Patient is still nonresponsive. She moves extremities to slightly upon painful stimuli. She had oxygen desaturation to 86%, initially needed nonrebreather, then change to 4 L nasal cannula oxygen with oxygen saturation 100% on room air.  When I saw patient in the ED, she is on 4L of oxygen with oxygen saturation 100%.  I turned off oxygen, her saturation is 100 % on room air. No active nausea, vomiting, diarrhea noted.  No active respiratory distress, cough noted.  Does not seem to have pain anywhere. Per her sister, pt has tongue and lip swelling which is normal to patient.  5/9: Seen and examined in stepdown unit.  Hemodynamically stable.  Little lethargic but does open her eyes upon prompting.  On 2 to 3 L nasal cannula.  Saturating 100%.  Does desaturate when she falls asleep.  No further seizure activity noted.  Neurology following.  Medication recommendations appreciated.  5/10: Patient seen and examined.  Transition to cardiac progressive unit.  No further seizure activity noted.  Patient sister/MPOA is at bedside.  She states that her level of mentation is baseline.  Seen by speech therapy.  Recommending dysphagia 3 diet.   Assessment & Plan:   Principal Problem:   Seizure (HCC) Active Problems:   UTI (urinary tract infection)   Hypothermia   Acute respiratory failure with hypoxia (HCC)  Seizure [facility](HCC):  Patient apparently had seizure for 40 minutes, suspected seizure status, initially intubated, but later on self  extubated. Highly doubt this was true status epilepticus Patient sats are preserved, she is protecting her airway Neurology following Plan: IV Keppra 1500 mg twice daily IV Dilantin 100 mg 3 times daily IV phenytoin 100 mg IV 3 times daily As needed Ativan for seizure activity Can likely transition to PO.  Will await formal neurology followup Phenobarbital and Dilantin levels ordered Neurology following  Possible UTI (urinary tract infection) UA is positive for UTI, but with squamous cell contamination.  Possible treated for breakthrough seizure however suspect medication nonadherence Plan: -IV Rocephin, 3 day course -Follow-up blood culture and urine culture   - blood culture no growth, urine culture contaminated  Hypothermia resolved -Bair hugger prn  Acute respiratory failure with hypoxia [facility](HCC): Chest x-ray negative.   Currently patient's oxygen saturation is 100% on room air when I turned off of her nasal cannula oxygen.  But upon falling asleep her oxygen does drop.  Suspect underlying OSA/OHS Plan: -As needed albuterol nebs -Prn nasal cannula oxygen to maintain oxygen saturation above 93%   DVT prophylaxis: Lovenox Code Status: Partial, okay with intubation but no CPR Family Communication: Sister/MPOA at bedside Disposition: Status is: Inpatient  Remains inpatient appropriate because:IV treatments appropriate due to intensity of illness or inability to take PO   Dispo: The patient is from: SNF               Anticipated d/c is to: SNF              Anticipated d/c date is: 1 day  Patient currently is not medically stable to d/c.   Remains on IV AEDs.  Removing foley today, proceed with voiding trial.  Likely discharge 08/21/19   Consultants:   Neurology  Procedures:   EEG  Antimicrobials:   Rocephin    Subjective: Seen and examined Sleepy At baseline per sister MPOA at bedside  Objective: Vitals:   08/19/19 1947 08/20/19 0001  08/20/19 0041 08/20/19 0419  BP:  108/74 (!) 117/55 115/61  Pulse:  89 86 90  Resp:  16 (!) 24 16  Temp:  98.1 F (36.7 C) 97.7 F (36.5 C) 97.8 F (36.6 C)  TempSrc: Oral Oral  Oral  SpO2: 100% 100% 100%   Weight:      Height:        Intake/Output Summary (Last 24 hours) at 08/20/2019 1140 Last data filed at 08/20/2019 1005 Gross per 24 hour  Intake 240 ml  Output 900 ml  Net -660 ml   Filed Weights   08/18/19 1341 08/18/19 1822  Weight: (!) 140.4 kg 135.7 kg    Examination:  General exam: Not in distress, lethargic Respiratory system: Decreased at bases, normal work of breathing Cardiovascular system: S1-S2 heard, no murmurs, no pedal edema  gastrointestinal system: Obese, nondistended, nontender.  Positive bowel sounds Central nervous system: Unable to assess Extremities: Unable to assess Skin: No rashes, lesions or ulcers Psychiatry: Unable to assess    Data Reviewed: I have personally reviewed following labs and imaging studies  CBC: Recent Labs  Lab 08/18/19 1337 08/19/19 0624  WBC 9.2 8.4  HGB 11.4* 10.9*  HCT 36.8 34.8*  MCV 86.6 86.4  PLT 305 270   Basic Metabolic Panel: Recent Labs  Lab 08/18/19 1337 08/19/19 0624  NA 138 140  K 4.2 4.6  CL 102 106  CO2 26 26  GLUCOSE 108* 112*  BUN 14 15  CREATININE 0.52 0.51  CALCIUM 9.2 9.1   GFR: Estimated Creatinine Clearance: 88.8 mL/min (by C-G formula based on SCr of 0.51 mg/dL). Liver Function Tests: Recent Labs  Lab 08/18/19 1337  AST 14*  ALT 15  ALKPHOS 225*  BILITOT 0.3  PROT 8.8*  ALBUMIN 2.9*   No results for input(s): LIPASE, AMYLASE in the last 168 hours. No results for input(s): AMMONIA in the last 168 hours. Coagulation Profile: Recent Labs  Lab 08/18/19 1548  INR 1.0   Cardiac Enzymes: No results for input(s): CKTOTAL, CKMB, CKMBINDEX, TROPONINI in the last 168 hours. BNP (last 3 results) No results for input(s): PROBNP in the last 8760 hours. HbA1C: No results for  input(s): HGBA1C in the last 72 hours. CBG: Recent Labs  Lab 08/18/19 1814 08/19/19 0736 08/20/19 0739  GLUCAP 99 88 78   Lipid Profile: No results for input(s): CHOL, HDL, LDLCALC, TRIG, CHOLHDL, LDLDIRECT in the last 72 hours. Thyroid Function Tests: No results for input(s): TSH, T4TOTAL, FREET4, T3FREE, THYROIDAB in the last 72 hours. Anemia Panel: No results for input(s): VITAMINB12, FOLATE, FERRITIN, TIBC, IRON, RETICCTPCT in the last 72 hours. Sepsis Labs: Recent Labs  Lab 08/18/19 1337 08/18/19 1659  LATICACIDVEN 1.9 1.0    Recent Results (from the past 240 hour(s))  Urine Culture     Status: Abnormal   Collection Time: 08/18/19  2:12 PM   Specimen: Urine, Random  Result Value Ref Range Status   Specimen Description   Final    URINE, RANDOM Performed at [facility], [address] [place], [place] [address]    Special Requests  Final    NONE Performed at [facility], [address] [place], [place] [address]    Culture MULTIPLE SPECIES PRESENT, SUGGEST RECOLLECTION (A)  Final   Report Status 08/20/2019 FINAL  Final  Respiratory Panel by RT PCR (Flu A&B, Covid) - Nasopharyngeal Swab     Status: None   Collection Time: 08/18/19  4:59 PM   Specimen: Nasopharyngeal Swab  Result Value Ref Range Status   SARS Coronavirus 2 by RT PCR NEGATIVE NEGATIVE Final    Comment: (NOTE) SARS-CoV-2 target nucleic acids are NOT DETECTED. The SARS-CoV-2 RNA is generally detectable in upper respiratoy specimens during the acute phase of infection. The lowest concentration of SARS-CoV-2 viral copies this assay can detect is 131 copies/mL. A negative result does not preclude SARS-Cov-2 infection and should not be used as the sole basis for treatment or other patient management decisions. A negative result may occur with  improper specimen collection/handling, submission of specimen other than nasopharyngeal swab, presence of viral mutation(s) within  the areas targeted by this assay, and inadequate number of viral copies (<131 copies/mL). A negative result must be combined with clinical observations, patient history, and epidemiological information. The expected result is Negative. Fact Sheet for Patients:  PinkCheek.be Fact Sheet for Healthcare Providers:  GravelBags.it This test is not yet ap proved or cleared by the [place] FDA and  has been authorized for detection and/or diagnosis of SARS-CoV-2 by FDA under an Emergency Use Authorization (EUA). This EUA will remain  in effect (meaning this test can be used) for the duration of the COVID-19 declaration under Section 564(b)(1) of the Act, 21 U.S.C. section 360bbb-3(b)(1), unless the authorization is terminated or revoked sooner.    Influenza A by PCR NEGATIVE NEGATIVE Final   Influenza B by PCR NEGATIVE NEGATIVE Final    Comment: (NOTE) The Xpert Xpress SARS-CoV-2/FLU/RSV assay is intended as an aid in  the diagnosis of influenza from Nasopharyngeal swab specimens and  should not be used as a sole basis for treatment. Nasal washings and  aspirates are unacceptable for Xpert Xpress SARS-CoV-2/FLU/RSV  testing. Fact Sheet for Patients: PinkCheek.be Fact Sheet for Healthcare Providers: GravelBags.it This test is not yet approved or cleared by the [place] FDA and  has been authorized for detection and/or diagnosis of SARS-CoV-2 by  FDA under an Emergency Use Authorization (EUA). This EUA will remain  in effect (meaning this test can be used) for the duration of the  Covid-19 declaration under Section 564(b)(1) of the Act, 21  U.S.C. section 360bbb-3(b)(1), unless the authorization is  terminated or revoked. Performed at [facility], [address] [place], [place] [address]   MRSA PCR Screening     Status: None   Collection Time: 08/18/19   6:31 PM   Specimen: Nasopharyngeal  Result Value Ref Range Status   MRSA by PCR NEGATIVE NEGATIVE Final    Comment:        The GeneXpert MRSA Assay (FDA approved for NASAL specimens only), is one component of a comprehensive MRSA colonization surveillance program. It is not intended to diagnose MRSA infection nor to guide or monitor treatment for MRSA infections. Performed at [facility], [address] [place], [place] [address]   CULTURE, BLOOD (ROUTINE X 2) w Reflex to ID Panel     Status: None (Preliminary result)   Collection Time: 08/18/19  6:59 PM   Specimen: BLOOD LEFT FOREARM  Result Value Ref Range Status   Specimen Description BLOOD LEFT  FOREARM  Final   Special Requests   Final    BOTTLES DRAWN AEROBIC AND ANAEROBIC Blood Culture adequate volume   Culture   Final    NO GROWTH 2 DAYS Performed at [facility], [address] [place], [place] [address]    Report Status PENDING  Incomplete  CULTURE, BLOOD (ROUTINE X 2) w Reflex to ID Panel     Status: None (Preliminary result)   Collection Time: 08/18/19  7:36 PM   Specimen: BLOOD  Result Value Ref Range Status   Specimen Description BLOOD LEFT AC  Final   Special Requests   Final    BOTTLES DRAWN AEROBIC AND ANAEROBIC Blood Culture results may not be optimal due to an inadequate volume of blood received in culture bottles   Culture   Final    NO GROWTH 2 DAYS Performed at [facility], [address], [place], [place] [address]    Report Status PENDING  Incomplete         Radiology Studies: EEG  Result Date: 08/19/2019 Charlsie Quest, MD     08/19/2019  4:15 PM Patient Name: Tyrah Broers MRN: [id number] Epilepsy Attending: Charlsie Quest Referring Physician/Provider: Dr Pauletta Browns Date: 08/19/2019 Duration: 24.33 mins Patient history: 74 y.o. female with medical history significant ofseizure, mental retardation, who presents with seizure. EEG to evaluate for seizure.  Level of alertness: awake, asleep AEDs during EEG study: PHT, LEV, PB Technical aspects: This EEG study was done with scalp electrodes positioned according to the 10-20 International system of electrode placement. Electrical activity was acquired at a sampling rate of 500Hz  and reviewed with a high frequency filter of 70Hz  and a low frequency filter of 1Hz . EEG data were recorded continuously and digitally stored. DESCRIPTION: The posterior dominant rhythm consists of 7 Hz activity of moderate voltage (25-35 uV) seen predominantly in posterior head regions, symmetric and reactive to eye opening and eye closing. Sleep was characterized by vertex waves, sleep spindles (12-14hz ), maximal frontocentral region. Generalized spikes and spike and wave were also noted, predominantly during sleep. Physiologic photic driving was not seen during photic stimulation.  Hyperventilation was not performed. 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 No seizures were seen throughout the recording.   CT Head Wo Contrast  Result Date: 08/18/2019 CLINICAL DATA:  Seizure. EXAM: CT HEAD WITHOUT CONTRAST TECHNIQUE: Contiguous axial images were obtained from the base of the skull through the vertex without intravenous contrast. COMPARISON:  July 06, 2019 FINDINGS: Brain: There is mild cerebral atrophy with widening of the extra-axial spaces and ventricular dilatation. There are areas of decreased attenuation within the white matter tracts of the supratentorial brain, consistent with microvascular disease changes. Vascular: No hyperdense vessel or unexpected calcification. Skull: Negative for fracture or focal lesion. Stable, diffuse thickening of the calvarium is noted. Sinuses/Orbits: There is mild bilateral ethmoid sinus mucosal thickening. Other: None. IMPRESSION: No acute intracranial  pathology. Electronically Signed   By: Charlsie Quest M.D.   On: 08/18/2019 15:59   DG Chest Portable 1 View  Result Date: 08/18/2019 CLINICAL DATA:  Hypoxia EXAM: PORTABLE CHEST 1 VIEW COMPARISON:  None. FINDINGS: Endotracheal tube tip is 2.0 cm above the carina. Nasogastric tube tip and side port are below the diaphragm. No pneumothorax. There is a small left pleural effusion. There is atelectatic change in the right mid lung and bibasilar regions. No airspace  consolidation. Heart is mildly enlarged with pulmonary vascularity normal. No adenopathy. No bone lesions. IMPRESSION: Tube positions as described without pneumothorax. Small left pleural effusion with bibasilar and right midlung atelectasis. No consolidation. Mild cardiomegaly. Electronically Signed   By: Bretta Bang III M.D.   On: 08/18/2019 14:11   DG Abd Portable 1 View  Result Date: 08/18/2019 CLINICAL DATA:  Orogastric tube placement EXAM: PORTABLE ABDOMEN - 1 VIEW COMPARISON:  None. FINDINGS: Orogastric tube tip and side port in stomach. No bowel dilatation or air-fluid level to suggest bowel obstruction. No free air. Moderate stool in colon. IMPRESSION: Orogastric tube tip and side port in stomach. No bowel obstruction or free air evident. Electronically Signed   By: Bretta Bang III M.D.   On: 08/18/2019 14:11        Scheduled Meds: . Chlorhexidine Gluconate Cloth  6 each Topical Daily  . enoxaparin (LOVENOX) injection  40 mg Subcutaneous Q12H  . mouth rinse  15 mL Mouth Rinse BID  . PHENObarbital  27.3 mg Intravenous TID  . phenytoin (DILANTIN) IV  100 mg Intravenous Q8H   Continuous Infusions: . sodium chloride 250 mL (08/19/19 2337)  . cefTRIAXone (ROCEPHIN)  IV 2 g (08/20/19 1004)  . levETIRAcetam 1,500 mg (08/19/19 2338)     LOS: 1 day    Time spent: 35 minutes    Tresa Moore, MD [facility] Pager [phone]  If 7PM-7AM, please contact night-coverage 08/20/2019, 11:40 AM

## 2019-08-20 NOTE — Evaluation (Signed)
Clinical/Bedside Swallow Evaluation Patient Details  Name: Mariah Jimenez MRN: [id number] Date of Birth: 09/03/1945  Today's Date: 08/20/2019 Time: SLP Start Time (ACUTE ONLY): 0911 SLP Stop Time (ACUTE ONLY): 0945 SLP Time Calculation (min) (ACUTE ONLY): 34 min  Past Medical History:  Past Medical History:  Diagnosis Date  . Mental retardation   . Seizures (HCC)    Past Surgical History:  Past Surgical History:  Procedure Laterality Date  . NO PAST SURGERIES    . TIBIA IM Jimenez INSERTION Right 04/09/2016   Procedure: INTRAMEDULLARY (IM) Jimenez TIBIAL;  Surgeon: Christena Flake, MD;  Location: [facility] ORS;  Service: Orthopedics;  Laterality: Right;   HPI:  Mariah Jimenez is a 74 y.o. female with medical history significant of seizure, mental retardation, who presents from [facility] with a seizure. Pt was intubated in the ED d/t decreased responsiveness but self-extubated. Per chart, at baseline pt produces a "uh" sound that varies slightly in prosody to communicate. There aren't any discernable yes/no. Per chart, sister confirms that pt's current mentation is at baseline alon with some baseline facial differences such a large tongue and large bottom lip. No acute intracranial pathology was evident in head CT and chest x-ray revealed small left pleural effusion with bibasilar and right midlung atelectasis, no consolidation, ild cardiomegaly.   Assessment / Plan / Recommendation Clinical Impression  Pt's current diet at [facility] could not be found even with extensive chart review. Pt currently on dysphagia 3 diet and thin liquids with SLP to assess for safety. Pt has baseline decreased labial movement, anterior tongue movements and much chew pattern. When consuming dysphaiga 3 diet with thin liquids via cup, pt is able to consume without any overt s/s of dysphagia or aspiration. She demosntrates functional oral phase with no oral residue and she has the appearance of a swift  swallow when consuming thin liquids. Given pt's current medical situation and unknown baseline diet, recommend pt remain on dysphagia 3 with thin liquids. When pt returns to [facility], staff familiar with her can advance diet further if indicated. Education provided to pt's MD and nurse. At this time, no further ST services are indicated.  SLP Visit Diagnosis: Dysphagia, unspecified (R13.10)    Aspiration Risk  Mild aspiration risk    Diet Recommendation Dysphagia 3 (Mech soft);Thin liquid   Liquid Administration via: Cup Medication Administration: Whole meds with puree Supervision: Full supervision/cueing for compensatory strategies;Staff to assist with self feeding Compensations: Minimize environmental distractions;Slow rate;Small sips/bites Postural Changes: Seated upright at 90 degrees    Other  Recommendations Oral Care Recommendations: Oral care BID   Follow up Recommendations None      Frequency and Duration   N/A         Prognosis   N/A    Swallow Study   General Date of Onset: 08/19/19 HPI: Mariah Jimenez is a 74 y.o. female with medical history significant of seizure, mental retardation, who presents from [facility] with a seizure. Pt was intubated in the ED d/t decreased responsiveness but self-extubated. Per chart, at baseline pt produces a "uh" sound that varies slightly in prosody to communicate. There aren't any discernable yes/no. Per chart, sister confirms that pt's current mentation is at baseline alon with some baseline facial differences such a large tongue and large bottom lip. No acute intracranial pathology was evident in head CT and chest x-ray revealed small left pleural effusion with bibasilar and right midlung atelectasis, no consolidation, ild cardiomegaly. Type of Study: Bedside Swallow  Evaluation Previous Swallow Assessment: none in chart Diet Prior to this Study: NPO Temperature Spikes Noted: No Respiratory Status: Room air History  of Recent Intubation: Yes(briefly in the ED) Behavior/Cognition: Alert;Cooperative;Pleasant mood Oral Cavity Assessment: Within Functional Limits Oral Care Completed by SLP: Recent completion by staff Oral Cavity - Dentition: Edentulous Self-Feeding Abilities: Needs assist;Total assist;Needs set up Patient Positioning: Upright in bed Baseline Vocal Quality: Not observed(d/t baseline cognitive deficits) Volitional Cough: Cognitively unable to elicit Volitional Swallow: Unable to elicit    Oral/Motor/Sensory Function Overall Oral Motor/Sensory Function: Other (comment)(at baseline, pt has oral motor differences)   Ice Chips Ice chips: Not tested   Thin Liquid Thin Liquid: Within functional limits Presentation: Cup;Self Fed    Nectar Thick Nectar Thick Liquid: Not tested   Honey Thick Honey Thick Liquid: Not tested   Puree Puree: Within functional limits Presentation: Spoon(SLP fed pt )   Solid     Solid: Within functional limits Presentation: Spoon(SLP fed pt) Other Comments: Dysphagia 3 diet textures were assessed     Mariah Jimenez, M.S., [place], [place] Office [phone]  Mariah Jimenez 08/20/2019,12:55 PM

## 2019-08-21 DIAGNOSIS — Z7401 Bed confinement status: Secondary | ICD-10-CM | POA: Diagnosis not present

## 2019-08-21 DIAGNOSIS — R404 Transient alteration of awareness: Secondary | ICD-10-CM | POA: Diagnosis not present

## 2019-08-21 DIAGNOSIS — I959 Hypotension, unspecified: Secondary | ICD-10-CM | POA: Diagnosis not present

## 2019-08-21 DIAGNOSIS — J9601 Acute respiratory failure with hypoxia: Secondary | ICD-10-CM | POA: Diagnosis not present

## 2019-08-21 DIAGNOSIS — R569 Unspecified convulsions: Secondary | ICD-10-CM | POA: Diagnosis not present

## 2019-08-21 DIAGNOSIS — M2550 Pain in unspecified joint: Secondary | ICD-10-CM | POA: Diagnosis not present

## 2019-08-21 LAB — GLUCOSE, CAPILLARY: Glucose-Capillary: 101 mg/dL — ABNORMAL HIGH (ref 70–99)

## 2019-08-21 MED ORDER — PHENOBARBITAL 16.2 MG PO TABS: 48.6000 mg | ORAL_TABLET | Freq: Three times a day (TID) | ORAL | Status: DC

## 2019-08-21 NOTE — Progress Notes (Signed)
Mariah Jimenez to be D/C'd to [facility] by EMS per MD order.  Discussed prescriptions and follow up appointments with the patient. Prescriptions given to patient, medication list explained in detail. Pt verbalized understanding.  Allergies as of 08/21/2019   No Known Allergies      Medication List     TAKE these medications    acetaminophen 325 MG tablet Commonly known as: TYLENOL Take 650 mg by mouth every 6 (six) hours as needed.   levETIRAcetam 500 MG tablet Commonly known as: KEPPRA Take 1,500 mg by mouth 2 (two) times daily.   loratadine 10 MG tablet Commonly known as: CLARITIN Take 10 mg by mouth daily.   phenobarbital 16.2 MG tablet Commonly known as: LUMINAL Take 3 tablets (48.6 mg total) by mouth 3 (three) times daily. What changed:  medication strength how much to take   phenytoin 100 MG ER capsule Commonly known as: DILANTIN Take 300 mg by mouth at bedtime.   SPS 15 GM/60ML suspension Generic drug: sodium polystyrene Take 60 mLs by mouth every Wednesday.   triamcinolone 0.1 % cream : eucerin Crea Apply 1 application topically 3 (three) times daily.        Vitals:   08/21/19 0637 08/21/19 1039  BP: (!) 113/53 (!) 99/54  Pulse: 98 95  Resp: 20 16  Temp: 98.6 F (37 C) 97.6 F (36.4 C)  SpO2: 99% 98%    Skin clean, dry and intact without evidence of skin break down, no evidence of skin tears noted. IV catheter discontinued intact. Site without signs and symptoms of complications. Dressing and pressure applied. Pt denies pain at this time. No complaints noted.  An After Visit Summary was printed and given to the patient. Patient escorted via WC, and D/C home via private auto.  Rondo Spittler A Niveah Boerner

## 2019-08-21 NOTE — Progress Notes (Signed)
Subjective: Patient without further seizure activity.  Now taking po medications.    Objective: Current vital signs: BP (!) 113/53 (BP Location: Left Arm)   Pulse 98   Temp 98.6 F (37 C)   Resp 20   Ht 5\' 5"  (1.651 m)   Wt 135.7 kg   SpO2 99%   BMI 49.78 kg/m  Vital signs in last 24 hours: Temp:  [97.7 F (36.5 C)-98.6 F (37 C)] 98.6 F (37 C) (05/11 06-[birth year]-1974) Pulse Rate:  [89-98] 98 (05/11 0637) Resp:  [17-20] 20 (05/11 0637) BP: (92-113)/(53-57) 113/53 (05/11 0637) SpO2:  [94 %-100 %] 99 % (05/11 0637)  Intake/Output from previous day: 05/10 0701 - 05/11 0700 In: 785.2 [P.O.:720; I.V.:65.2] Out: 1 [Urine:1] Intake/Output this shift: Total I/O In: 240 [P.O.:240] Out: -  Nutritional status:  Diet Order            Diet - low sodium heart healthy        DIET DYS 3 Room service appropriate? Yes; Fluid consistency: Thin  Diet effective now              Neurologic Exam: Mental Status: Sleeping but easily awakened.  Follows simple commands.  Speech dysarthric.   Cranial Nerves: II: Blinks to bilateral confrontation.   III,IV, VI: Extra-ocular motions grossly intact bilaterally V,VII: smile symmetric VIII: hearing normal bilaterally IX,X: gag reflex present XI: bilateral shoulder shrug XII: midline tongue extension Motor: Moves all extremities weakly with no focal weakness appreciated Sensory: Pinprick and light touch intact throughout, bilaterally   Lab Results: Basic Metabolic Panel: Recent Labs  Lab 08/18/19 1337 08/19/19 0624  NA 138 140  K 4.2 4.6  CL 102 106  CO2 26 26  GLUCOSE 108* 112*  BUN 14 15  CREATININE 0.52 0.51  CALCIUM 9.2 9.1    Liver Function Tests: Recent Labs  Lab 08/18/19 1337  AST 14*  ALT 15  ALKPHOS 225*  BILITOT 0.3  PROT 8.8*  ALBUMIN 2.9*   No results for input(s): LIPASE, AMYLASE in the last 168 hours. No results for input(s): AMMONIA in the last 168 hours.  CBC: Recent Labs  Lab 08/18/19 1337  08/19/19 0624  WBC 9.2 8.4  HGB 11.4* 10.9*  HCT 36.8 34.8*  MCV 86.6 86.4  PLT 305 270    Cardiac Enzymes: No results for input(s): CKTOTAL, CKMB, CKMBINDEX, TROPONINI in the last 168 hours.  Lipid Panel: No results for input(s): CHOL, TRIG, HDL, CHOLHDL, VLDL, LDLCALC in the last 168 hours.  CBG: Recent Labs  Lab 08/18/19 1814 08/19/19 0736 08/20/19 0739 08/21/19 0756  GLUCAP 99 88 78 101*    Microbiology: Results for orders placed or performed during the hospital encounter of 08/18/19  Urine Culture     Status: Abnormal   Collection Time: 08/18/19  2:12 PM   Specimen: Urine, Random  Result Value Ref Range Status   Specimen Description   Final    URINE, RANDOM Performed at [facility], [address] [place], [place]    Special Requests   Final    NONE Performed at [facility], [address] [place], [place]    Culture MULTIPLE SPECIES PRESENT, SUGGEST RECOLLECTION (A)  Final   Report Status 08/20/2019 FINAL  Final  Respiratory Panel by RT PCR (Flu A&B, Covid) - Nasopharyngeal Swab     Status: None   Collection Time: 08/18/19  4:59 PM   Specimen: Nasopharyngeal Swab  Result Value Ref Range Status  SARS Coronavirus 2 by RT PCR NEGATIVE NEGATIVE Final    Comment: (NOTE) SARS-CoV-2 target nucleic acids are NOT DETECTED. The SARS-CoV-2 RNA is generally detectable in upper respiratoy specimens during the acute phase of infection. The lowest concentration of SARS-CoV-2 viral copies this assay can detect is 131 copies/mL. A negative result does not preclude SARS-Cov-2 infection and should not be used as the sole basis for treatment or other patient management decisions. A negative result may occur with  improper specimen collection/handling, submission of specimen other than nasopharyngeal swab, presence of viral mutation(s) within the areas targeted by this assay, and inadequate number of viral copies (<131 copies/mL).  A negative result must be combined with clinical observations, patient history, and epidemiological information. The expected result is Negative. Fact Sheet for Patients:  https://www.moore.com/ Fact Sheet for Healthcare Providers:  https://www.young.biz/ This test is not yet ap proved or cleared by the [place] FDA and  has been authorized for detection and/or diagnosis of SARS-CoV-2 by FDA under an Emergency Use Authorization (EUA). This EUA will remain  in effect (meaning this test can be used) for the duration of the COVID-19 declaration under Section 564(b)(1) of the Act, 21 U.S.C. section 360bbb-3(b)(1), unless the authorization is terminated or revoked sooner.    Influenza A by PCR NEGATIVE NEGATIVE Final   Influenza B by PCR NEGATIVE NEGATIVE Final    Comment: (NOTE) The Xpert Xpress SARS-CoV-2/FLU/RSV assay is intended as an aid in  the diagnosis of influenza from Nasopharyngeal swab specimens and  should not be used as a sole basis for treatment. Nasal washings and  aspirates are unacceptable for Xpert Xpress SARS-CoV-2/FLU/RSV  testing. Fact Sheet for Patients: https://www.moore.com/ Fact Sheet for Healthcare Providers: https://www.young.biz/ This test is not yet approved or cleared by the [place] FDA and  has been authorized for detection and/or diagnosis of SARS-CoV-2 by  FDA under an Emergency Use Authorization (EUA). This EUA will remain  in effect (meaning this test can be used) for the duration of the  Covid-19 declaration under Section 564(b)(1) of the Act, 21  U.S.C. section 360bbb-3(b)(1), unless the authorization is  terminated or revoked. Performed at [facility], [address] [place], [place] [address]   MRSA PCR Screening     Status: None   Collection Time: 08/18/19  6:31 PM   Specimen: Nasopharyngeal  Result Value Ref Range Status   MRSA by PCR  NEGATIVE NEGATIVE Final    Comment:        The GeneXpert MRSA Assay (FDA approved for NASAL specimens only), is one component of a comprehensive MRSA colonization surveillance program. It is not intended to diagnose MRSA infection nor to guide or monitor treatment for MRSA infections. Performed at [facility], [address] [place], [place] [address]   CULTURE, BLOOD (ROUTINE X 2) w Reflex to ID Panel     Status: None (Preliminary result)   Collection Time: 08/18/19  6:59 PM   Specimen: BLOOD LEFT FOREARM  Result Value Ref Range Status   Specimen Description BLOOD LEFT FOREARM  Final   Special Requests   Final    BOTTLES DRAWN AEROBIC AND ANAEROBIC Blood Culture adequate volume   Culture   Final    NO GROWTH 3 DAYS Performed at [facility], [address], [place], [place] [address]    Report Status PENDING  Incomplete  CULTURE, BLOOD (ROUTINE X 2) w Reflex to ID Panel     Status: None (Preliminary result)  Collection Time: 08/18/19  7:36 PM   Specimen: BLOOD  Result Value Ref Range Status   Specimen Description BLOOD LEFT AC  Final   Special Requests   Final    BOTTLES DRAWN AEROBIC AND ANAEROBIC Blood Culture results may not be optimal due to an inadequate volume of blood received in culture bottles   Culture   Final    NO GROWTH 3 DAYS Performed at [facility], [address] [place], [place] [address]    Report Status PENDING  Incomplete    Coagulation Studies: Recent Labs    08/18/19 1548  LABPROT 13.1  INR 1.0    Imaging: EEG  Result Date: 08/19/2019 Lora Havens, MD     08/19/2019  4:15 PM Patient Name: Mariah Jimenez MRN: [id number] Epilepsy Attending: Lora Havens Referring Physician/Provider: Dr Leotis Pain Date: 08/19/2019 Duration: 24.33 mins Patient history: 74 y.o. female with medical history significant ofseizure, mental retardation, who presents with seizure. EEG to evaluate for seizure. Level of alertness:  awake, asleep AEDs during EEG study: PHT, LEV, PB Technical aspects: This EEG study was done with scalp electrodes positioned according to the 10-20 International system of electrode placement. Electrical activity was acquired at a sampling rate of 500Hz  and reviewed with a high frequency filter of 70Hz  and a low frequency filter of 1Hz . EEG data were recorded continuously and digitally stored. DESCRIPTION: The posterior dominant rhythm consists of 7 Hz activity of moderate voltage (25-35 uV) seen predominantly in posterior head regions, symmetric and reactive to eye opening and eye closing. Sleep was characterized by vertex waves, sleep spindles (12-14hz ), maximal frontocentral region. Generalized spikes and spike and wave were also noted, predominantly during sleep. Physiologic photic driving was not seen during photic stimulation.  Hyperventilation was not performed. 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 No seizures were seen throughout the recording. Priyanka Barbra Sarks    Medications:  I have reviewed the patient's current medications. Scheduled: . Chlorhexidine Gluconate Cloth  6 each Topical Daily  . enoxaparin (LOVENOX) injection  40 mg Subcutaneous Q12H  . levETIRAcetam  1,500 mg Oral BID  . mouth rinse  15 mL Mouth Rinse BID  . PHENobarbital  48.6 mg Oral TID  . phenytoin  300 mg Oral QHS    Assessment/Plan: 74 y.o.femalewith medical history significant ofseizure, mental retardation, who presents with breakthrough seizure. Per her sister, pt had seizure which lasted for about 40 min. Pt was treated with 6 mg Versed by EMS. Pt wasnonresponsive andand was intubated in ED. Patient now extubated.  Found to have UTI which likely lowered seizure threshold.    Dilantin level 10.5.    Recommendations: 1. Continue anticonvulsant therapy at current doses.   2.  Seizure precautions.   3. No further neurologic intervention is recommended at this time.  If further questions arise, please call or page at that time.  Thank you for allowing neurology to participate in the care of this patient.  Patient to continue follow up on an outpatient basis.    LOS: 2 days   Alexis Goodell, MD Neurology [phone] 08/21/2019  10:39 AM

## 2019-08-21 NOTE — TOC Transition Note (Signed)
Transition of Care [facility]) - CM/SW Discharge Note   Patient Details  Name: Mariah Jimenez MRN: [id number] Date of Birth: 1945-07-23  Transition of Care [facility] CM/SW Contact:  Chapman Fitch, RN Phone Number: 08/21/2019, 10:28 AM   Clinical Narrative:    Patient to be discharge today to [facility] care center DC info sent in the hub Sister notified of discharge  EMS packet printed, bedside RN notified EMS called   Final next level of care: Long Term Nursing Home Barriers to Discharge: No Barriers Identified   Patient Goals and CMS Choice        Discharge Placement              Patient chooses bed at: [facility] Patient to be transferred to facility by: EMS Name of family member notified: Sister [name] Patient and family notified of of transfer: 08/21/19  Discharge Plan and Services                                     Social Determinants of Health (SDOH) Interventions     Readmission Risk Interventions No flowsheet data found.

## 2019-08-21 NOTE — Discharge Summary (Signed)
Physician Discharge Summary  Mariah Jimenez NFA:[id number] DOB: [birth year] DOA: 08/18/2019  PCP: Randell Loop, [facility]  Admit date: 08/18/2019 Discharge date: 08/21/2019  Admitted From: SNF Disposition: SNF  Recommendations for Outpatient Follow-up:  1. Follow up with PCP in 1-2 weeks 2. Follow-up with outpatient neurology as directed  Home Health: No Equipment/Devices: None  Discharge Condition: Stable CODE STATUS: Partial, okay with intubation but no resuscitation Diet recommendation: Dysphagia   Brief/Interim Summary: [name], mental retardation, who presents with seizure.  Per her sister, pt had seizure which lasted for about 40 min. Pt was treated with 6 mg Versed by EMS. Pt wasnonresponsive andand was intubated in ED. Later on, patient self extubated. Patient is still nonresponsive. Shemoves extremities to slightlyuponpainful stimuli.She hadoxygen desaturation to 86%, initially needed nonrebreather, then change to 4 L nasal cannula oxygen with oxygen saturation 100% on room air.When I saw patient in the ED, she is on 4L ofoxygenwithoxygen saturation 100%. I turnedoff oxygen, her saturation is 100% on room air.Noactive nausea,vomiting, diarrhea noted. No active respiratory distress, cough noted. Does not seem to have pain anywhere. Per her sister, pt hastongue and lip swelling which is normal to patient.  5/9: Seen and examined in stepdown unit.  Hemodynamically stable.  Little lethargic but does open her eyes upon prompting.  On 2 to 3 L nasal cannula.  Saturating 100%.  Does desaturate when she falls asleep.  No further seizure activity noted.  Neurology following.  Medication recommendations appreciated.  5/10: Patient seen and examined.  Transition to cardiac progressive unit.  No further seizure activity noted.  Patient sister/MPOA is at bedside.  She states that  her level of mentation is baseline.  Seen by speech therapy.  Recommending dysphagia 3 diet.  5/11: Patient seen and examined on the day of discharge.  No further seizure activity noted.  Medications titrated per neurology recommendations.  Stable for discharge at this time.  Will return to previous skilled nursing facility.  Discharge Diagnoses:  Principal Problem:   Seizure (HCC) Active Problems:   UTI (urinary tract infection)   Hypothermia   Acute respiratory failure with hypoxia (HCC) Seizure [facility]): Patient apparently had seizure for 40 minutes, suspected seizure status, initially intubated, but later on self extubated. Highly doubt this was true status epilepticus Patient sats are preserved, she is protecting her airway Neurology following Discharge recommendations: Phenobarbital 48.6 mg 3 times daily Keppra 1500 mg twice daily Phenytoin 300 mg daily Follow-up with outpatient neurology.  If this is a recurrent issue, consider possibly altering her antiepileptic regimen.  This patient has been on these medications for an extended period of time so thus any titration or change of these medications need to be in a closely monitored setting and supervised by a neurologist.  PossibleUTI (urinary tract infection) UA is positive for UTI, but withsquamous cell contamination.  Possible treated for breakthrough seizure however suspect medication nonadherence Urine culture contaminated Received 3 doses of IV Rocephin No further antibiotics indicated   Hypothermia resolved -Bair hugger prn  Acute respiratory failure with hypoxia [facility]): Chest x-ray negative.  Currently patient's oxygen saturation is 100% on room airwhen I turned off of hernasal cannula oxygen.  But upon falling asleep her oxygen does drop.  Suspect underlying OSA/OHS Discharge recommendations: -As needed albuterolnebs -Prnnasal cannula oxygen to maintain oxygen saturation above 93%   Discharge  Instructions  Discharge Instructions    Diet - low sodium heart healthy   Complete by:  As directed    Increase activity slowly   Complete by: As directed      Allergies as of 08/21/2019   No Known Allergies     Medication List    TAKE these medications   acetaminophen 325 MG tablet Commonly known as: TYLENOL Take 650 mg by mouth every 6 (six) hours as needed.   levETIRAcetam 500 MG tablet Commonly known as: KEPPRA Take 1,500 mg by mouth 2 (two) times daily.   loratadine 10 MG tablet Commonly known as: CLARITIN Take 10 mg by mouth daily.   phenobarbital 16.2 MG tablet Commonly known as: LUMINAL Take 3 tablets (48.6 mg total) by mouth 3 (three) times daily. What changed:   medication strength  how much to take   phenytoin 100 MG ER capsule Commonly known as: DILANTIN Take 300 mg by mouth at bedtime.   SPS 15 GM/60ML suspension Generic drug: sodium polystyrene Take 60 mLs by mouth every Wednesday.   triamcinolone 0.1 % cream : eucerin Crea Apply 1 application topically 3 (three) times daily.       No Known Allergies  Consultations:  Neurology   Procedures/Studies: EEG  Result Date: 08/19/2019 Charlsie Quest, MD     08/19/2019  4:15 PM Patient Name: Mariah Jimenez MRN: [id number] Epilepsy Attending: Charlsie Quest Referring Physician/Provider: Dr Pauletta Browns Date: 08/19/2019 Duration: 24.33 mins Patient history: 74 y.o. female with medical history significant ofseizure, mental retardation, who presents with seizure. EEG to evaluate for seizure. Level of alertness: awake, asleep AEDs during EEG study: PHT, LEV, PB Technical aspects: This EEG study was done with scalp electrodes positioned according to the 10-20 International system of electrode placement. Electrical activity was acquired at a sampling rate of 500Hz  and reviewed with a high frequency filter of 70Hz  and a low frequency filter of 1Hz . EEG data were recorded continuously and digitally stored.  DESCRIPTION: The posterior dominant rhythm consists of 7 Hz activity of moderate voltage (25-35 uV) seen predominantly in posterior head regions, symmetric and reactive to eye opening and eye closing. Sleep was characterized by vertex waves, sleep spindles (12-14hz ), maximal frontocentral region. Generalized spikes and spike and wave were also noted, predominantly during sleep. Physiologic photic driving was not seen during photic stimulation.  Hyperventilation was not performed. 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 No seizures were seen throughout the recording.   CT Head Wo Contrast  Result Date: 08/18/2019 CLINICAL DATA:  Seizure. EXAM: CT HEAD WITHOUT CONTRAST TECHNIQUE: Contiguous axial images were obtained from the base of the skull through the vertex without intravenous contrast. COMPARISON:  July 06, 2019 FINDINGS: Brain: There is mild cerebral atrophy with widening of the extra-axial spaces and ventricular dilatation. There are areas of decreased attenuation within the white matter tracts of the supratentorial brain, consistent with microvascular disease changes. Vascular: No hyperdense vessel or unexpected calcification. Skull: Negative for fracture or focal lesion. Stable, diffuse thickening of the calvarium is noted. Sinuses/Orbits: There is mild bilateral ethmoid sinus mucosal thickening. Other: None. IMPRESSION: No acute intracranial pathology. Electronically Signed   By: Charlsie Quest M.D.   On: 08/18/2019 15:59   DG Chest Portable 1 View  Result Date: 08/18/2019 CLINICAL DATA:  Hypoxia EXAM: PORTABLE CHEST 1 VIEW COMPARISON:  None. FINDINGS: Endotracheal tube tip is 2.0 cm above the carina. Nasogastric tube tip and side port are below the diaphragm. No pneumothorax. There is  a small left pleural effusion. There is atelectatic change in the  right mid lung and bibasilar regions. No airspace consolidation. Heart is mildly enlarged with pulmonary vascularity normal. No adenopathy. No bone lesions. IMPRESSION: Tube positions as described without pneumothorax. Small left pleural effusion with bibasilar and right midlung atelectasis. No consolidation. Mild cardiomegaly. Electronically Signed   By: Bretta Bang III M.D.   On: 08/18/2019 14:11   DG Abd Portable 1 View  Result Date: 08/18/2019 CLINICAL DATA:  Orogastric tube placement EXAM: PORTABLE ABDOMEN - 1 VIEW COMPARISON:  None. FINDINGS: Orogastric tube tip and side port in stomach. No bowel dilatation or air-fluid level to suggest bowel obstruction. No free air. Moderate stool in colon. IMPRESSION: Orogastric tube tip and side port in stomach. No bowel obstruction or free air evident. Electronically Signed   By: Bretta Bang III M.D.   On: 08/18/2019 14:11    (Echo, Carotid, EGD, Colonoscopy, ERCP)    Subjective: Patient seen and examined on the day of discharge Mental status at baseline No further seizure activity noted  Discharge Exam: Vitals:   08/20/19 1950 08/21/19 0637  BP: (!) 92/56 (!) 113/53  Pulse: 89 98  Resp: 17 20  Temp:  98.6 F (37 C)  SpO2: 94% 99%   Vitals:   08/20/19 1246 08/20/19 1948 08/20/19 1950 08/21/19 0637  BP: (!) 104/57  (!) 92/56 (!) 113/53  Pulse: 94  89 98  Resp: Temp: 97.7 F (36.5 C) 98.1 F (36.7 C)  98.6 F (37 C)  TempSrc: Oral Axillary    SpO2: 100%  94% 99%  Weight:      Height:        General: Patient is awake, nonverbal, baseline Cardiovascular: RRR, S1/S2 +, no rubs, no gallops Respiratory: Decreased at bases, normal work of breathing Abdominal: Obese, nontender, positive bowel sounds Extremities: no edema, no cyanosis    The results of significant diagnostics from this hospitalization (including imaging, microbiology, ancillary and laboratory) are listed below for reference.      Microbiology: Recent Results (from the past 240 hour(s))  Urine Culture     Status: Abnormal   Collection Time: 08/18/19  2:12 PM   Specimen: Urine, Random  Result Value Ref Range Status   Specimen Description   Final    URINE, RANDOM Performed at [facility], [address] [place], [place] [address]    Special Requests   Final    NONE Performed at [facility], [address] [place], [place] [address]    Culture MULTIPLE SPECIES PRESENT, SUGGEST RECOLLECTION (A)  Final   Report Status 08/20/2019 FINAL  Final  Respiratory Panel by RT PCR (Flu A&B, Covid) - Nasopharyngeal Swab     Status: None   Collection Time: 08/18/19  4:59 PM   Specimen: Nasopharyngeal Swab  Result Value Ref Range Status   SARS Coronavirus 2 by RT PCR NEGATIVE NEGATIVE Final    Comment: (NOTE) SARS-CoV-2 target nucleic acids are NOT DETECTED. The SARS-CoV-2 RNA is generally detectable in upper respiratoy specimens during the acute phase of infection. The lowest concentration of SARS-CoV-2 viral copies this assay can detect is 131 copies/mL. A negative result does not preclude SARS-Cov-2 infection and should not be used as the sole basis for treatment or other patient management decisions. A negative result may occur with  improper specimen collection/handling, submission of specimen other than nasopharyngeal swab, presence of viral mutation(s) within the areas targeted by this assay,  and inadequate number of viral copies (<131 copies/mL). A negative result must be combined with clinical observations, patient history, and epidemiological information. The expected result is Negative. Fact Sheet for Patients:  https://www.moore.com/ Fact Sheet for Healthcare Providers:  https://www.young.biz/ This test is not yet ap proved or cleared by the [place] FDA and  has been authorized for detection and/or diagnosis of SARS-CoV-2 by FDA under  an Emergency Use Authorization (EUA). This EUA will remain  in effect (meaning this test can be used) for the duration of the COVID-19 declaration under Section 564(b)(1) of the Act, 21 U.S.C. section 360bbb-3(b)(1), unless the authorization is terminated or revoked sooner.    Influenza A by PCR NEGATIVE NEGATIVE Final   Influenza B by PCR NEGATIVE NEGATIVE Final    Comment: (NOTE) The Xpert Xpress SARS-CoV-2/FLU/RSV assay is intended as an aid in  the diagnosis of influenza from Nasopharyngeal swab specimens and  should not be used as a sole basis for treatment. Nasal washings and  aspirates are unacceptable for Xpert Xpress SARS-CoV-2/FLU/RSV  testing. Fact Sheet for Patients: https://www.moore.com/ Fact Sheet for Healthcare Providers: https://www.young.biz/ This test is not yet approved or cleared by the [place] FDA and  has been authorized for detection and/or diagnosis of SARS-CoV-2 by  FDA under an Emergency Use Authorization (EUA). This EUA will remain  in effect (meaning this test can be used) for the duration of the  Covid-19 declaration under Section 564(b)(1) of the Act, 21  U.S.C. section 360bbb-3(b)(1), unless the authorization is  terminated or revoked. Performed at [facility], [address] [place], [place] [address]   MRSA PCR Screening     Status: None   Collection Time: 08/18/19  6:31 PM   Specimen: Nasopharyngeal  Result Value Ref Range Status   MRSA by PCR NEGATIVE NEGATIVE Final    Comment:        The GeneXpert MRSA Assay (FDA approved for NASAL specimens only), is one component of a comprehensive MRSA colonization surveillance program. It is not intended to diagnose MRSA infection nor to guide or monitor treatment for MRSA infections. Performed at [facility], [address] [place], [place] [address]   CULTURE, BLOOD (ROUTINE X 2) w Reflex to ID Panel     Status: None (Preliminary  result)   Collection Time: 08/18/19  6:59 PM   Specimen: BLOOD LEFT FOREARM  Result Value Ref Range Status   Specimen Description BLOOD LEFT FOREARM  Final   Special Requests   Final    BOTTLES DRAWN AEROBIC AND ANAEROBIC Blood Culture adequate volume   Culture   Final    NO GROWTH 3 DAYS Performed at [facility], [address] [place], [place] [address]    Report Status PENDING  Incomplete  CULTURE, BLOOD (ROUTINE X 2) w Reflex to ID Panel     Status: None (Preliminary result)   Collection Time: 08/18/19  7:36 PM   Specimen: BLOOD  Result Value Ref Range Status   Specimen Description BLOOD LEFT AC  Final   Special Requests   Final    BOTTLES DRAWN AEROBIC AND ANAEROBIC Blood Culture results may not be optimal due to an inadequate volume of blood received in culture bottles   Culture   Final    NO GROWTH 3 DAYS Performed at [facility], [address], [place], [place] [address]    Report Status PENDING  Incomplete     Labs: BNP (last 3 results) Recent Labs  08/18/19 1337  BNP 32.0   Basic Metabolic Panel: Recent Labs  Lab 08/18/19 1337 08/19/19 0624  NA 138 140  K 4.2 4.6  CL 102 106  CO2 26 26  GLUCOSE 108* 112*  BUN 14 15  CREATININE 0.52 0.51  CALCIUM 9.2 9.1   Liver Function Tests: Recent Labs  Lab 08/18/19 1337  AST 14*  ALT 15  ALKPHOS 225*  BILITOT 0.3  PROT 8.8*  ALBUMIN 2.9*   No results for input(s): LIPASE, AMYLASE in the last 168 hours. No results for input(s): AMMONIA in the last 168 hours. CBC: Recent Labs  Lab 08/18/19 1337 08/19/19 0624  WBC 9.2 8.4  HGB 11.4* 10.9*  HCT 36.8 34.8*  MCV 86.6 86.4  PLT 305 270   Cardiac Enzymes: No results for input(s): CKTOTAL, CKMB, CKMBINDEX, TROPONINI in the last 168 hours. BNP: Invalid input(s): POCBNP CBG: Recent Labs  Lab 08/18/19 1814 08/19/19 0736 08/20/19 0739 08/21/19 0756  GLUCAP 99 88 78 101*   D-Dimer No results for input(s): DDIMER in the last  72 hours. Hgb A1c No results for input(s): HGBA1C in the last 72 hours. Lipid Profile No results for input(s): CHOL, HDL, LDLCALC, TRIG, CHOLHDL, LDLDIRECT in the last 72 hours. Thyroid function studies No results for input(s): TSH, T4TOTAL, T3FREE, THYROIDAB in the last 72 hours.  Invalid input(s): FREET3 Anemia work up No results for input(s): VITAMINB12, FOLATE, FERRITIN, TIBC, IRON, RETICCTPCT in the last 72 hours. Urinalysis    Component Value Date/Time   COLORURINE AMBER (A) 08/18/2019 1412   APPEARANCEUR TURBID (A) 08/18/2019 1412   LABSPEC 1.012 08/18/2019 1412   PHURINE 9.0 (H) 08/18/2019 1412   GLUCOSEU NEGATIVE 08/18/2019 1412   HGBUR NEGATIVE 08/18/2019 1412   BILIRUBINUR NEGATIVE 08/18/2019 1412   KETONESUR NEGATIVE 08/18/2019 1412   PROTEINUR 100 (A) 08/18/2019 1412   UROBILINOGEN 0.2 05/20/2013 0437   NITRITE NEGATIVE 08/18/2019 1412   LEUKOCYTESUR MODERATE (A) 08/18/2019 1412   Sepsis Labs Invalid input(s): PROCALCITONIN,  WBC,  LACTICIDVEN Microbiology Recent Results (from the past 240 hour(s))  Urine Culture     Status: Abnormal   Collection Time: 08/18/19  2:12 PM   Specimen: Urine, Random  Result Value Ref Range Status   Specimen Description   Final    URINE, RANDOM Performed at [facility], [address], [place], [place] [address]    Special Requests   Final    NONE Performed at [facility], [address] [place], [place] [address]    Culture MULTIPLE SPECIES PRESENT, SUGGEST RECOLLECTION (A)  Final   Report Status 08/20/2019 FINAL  Final  Respiratory Panel by RT PCR (Flu A&B, Covid) - Nasopharyngeal Swab     Status: None   Collection Time: 08/18/19  4:59 PM   Specimen: Nasopharyngeal Swab  Result Value Ref Range Status   SARS Coronavirus 2 by RT PCR NEGATIVE NEGATIVE Final    Comment: (NOTE) SARS-CoV-2 target nucleic acids are NOT DETECTED. The SARS-CoV-2 RNA is generally detectable in upper respiratoy specimens  during the acute phase of infection. The lowest concentration of SARS-CoV-2 viral copies this assay can detect is 131 copies/mL. A negative result does not preclude SARS-Cov-2 infection and should not be used as the sole basis for treatment or other patient management decisions. A negative result may occur with  improper specimen collection/handling, submission of specimen other than nasopharyngeal swab, presence of viral mutation(s) within the areas targeted by this assay, and inadequate number of viral copies (<  131 copies/mL). A negative result must be combined with clinical observations, patient history, and epidemiological information. The expected result is Negative. Fact Sheet for Patients:  https://www.moore.com/https://www.fda.gov/media/142436/download Fact Sheet for Healthcare Providers:  https://www.young.biz/https://www.fda.gov/media/142435/download This test is not yet ap proved or cleared by the [place] FDA and  has been authorized for detection and/or diagnosis of SARS-CoV-2 by FDA under an Emergency Use Authorization (EUA). This EUA will remain  in effect (meaning this test can be used) for the duration of the COVID-19 declaration under Section 564(b)(1) of the Act, 21 U.S.C. section 360bbb-3(b)(1), unless the authorization is terminated or revoked sooner.    Influenza A by PCR NEGATIVE NEGATIVE Final   Influenza B by PCR NEGATIVE NEGATIVE Final    Comment: (NOTE) The Xpert Xpress SARS-CoV-2/FLU/RSV assay is intended as an aid in  the diagnosis of influenza from Nasopharyngeal swab specimens and  should not be used as a sole basis for treatment. Nasal washings and  aspirates are unacceptable for Xpert Xpress SARS-CoV-2/FLU/RSV  testing. Fact Sheet for Patients: https://www.moore.com/https://www.fda.gov/media/142436/download Fact Sheet for Healthcare Providers: https://www.young.biz/https://www.fda.gov/media/142435/download This test is not yet approved or cleared by the [place] FDA and  has been authorized for detection and/or diagnosis of  SARS-CoV-2 by  FDA under an Emergency Use Authorization (EUA). This EUA will remain  in effect (meaning this test can be used) for the duration of the  Covid-19 declaration under Section 564(b)(1) of the Act, 21  U.S.C. section 360bbb-3(b)(1), unless the authorization is  terminated or revoked. Performed at [facility], [address] [place], [place] [address]   MRSA PCR Screening     Status: None   Collection Time: 08/18/19  6:31 PM   Specimen: Nasopharyngeal  Result Value Ref Range Status   MRSA by PCR NEGATIVE NEGATIVE Final    Comment:        The GeneXpert MRSA Assay (FDA approved for NASAL specimens only), is one component of a comprehensive MRSA colonization surveillance program. It is not intended to diagnose MRSA infection nor to guide or monitor treatment for MRSA infections. Performed at [facility], [address] [place], [place] [address]   CULTURE, BLOOD (ROUTINE X 2) w Reflex to ID Panel     Status: None (Preliminary result)   Collection Time: 08/18/19  6:59 PM   Specimen: BLOOD LEFT FOREARM  Result Value Ref Range Status   Specimen Description BLOOD LEFT FOREARM  Final   Special Requests   Final    BOTTLES DRAWN AEROBIC AND ANAEROBIC Blood Culture adequate volume   Culture   Final    NO GROWTH 3 DAYS Performed at [facility], [address] [place], [place] [address]    Report Status PENDING  Incomplete  CULTURE, BLOOD (ROUTINE X 2) w Reflex to ID Panel     Status: None (Preliminary result)   Collection Time: 08/18/19  7:36 PM   Specimen: BLOOD  Result Value Ref Range Status   Specimen Description BLOOD LEFT AC  Final   Special Requests   Final    BOTTLES DRAWN AEROBIC AND ANAEROBIC Blood Culture results may not be optimal due to an inadequate volume of blood received in culture bottles   Culture   Final    NO GROWTH 3 DAYS Performed at [facility], [address] [place], [place] [address]    Report  Status PENDING  Incomplete     Time coordinating discharge: Over 30 minutes  SIGNED:   Tresa MooreSudheer B Chardai Gangemi, MD  [facility] 08/21/2019, 9:53 AM Pager   If 7PM-7AM, please contact night-coverage

## 2019-08-21 NOTE — NC FL2 (Signed)
[place] LEVEL OF CARE SCREENING TOOL     IDENTIFICATION  Patient Name: Mariah Jimenez Birthdate: 1945-07-12 Sex: female Admission Date (Current Location): 08/18/2019  [facility] and [place] Number:  Engineering geologist and Address:         Provider Number: [id number]  Attending Physician Name and Address:  Sidney Ace, MD  Relative Name and Phone Number:       Current Level of Care: Hospital Recommended Level of Care: [place] Prior Approval Number:    Date Approved/Denied:   PASRR Number: [id number]  Discharge Plan:      Current Diagnoses: Patient Active Problem List   Diagnosis Date Noted  . Seizure ([place]) 08/18/2019  . UTI (urinary tract infection) 08/18/2019  . Hypothermia 08/18/2019  . Acute respiratory failure with hypoxia ([place]) 08/18/2019  . Palliative care encounter 10/06/2018  . Debility 10/06/2018  . Tibia/fibula fracture 04/09/2016    Orientation RESPIRATION BLADDER Height & Weight     (disoriented x4)  O2(2L) Incontinent Weight: 135.7 kg Height:  5\' 5"  (165.1 cm)  BEHAVIORAL SYMPTOMS/MOOD NEUROLOGICAL BOWEL NUTRITION STATUS      Incontinent Diet(dys 3 thin liquid)  AMBULATORY STATUS COMMUNICATION OF NEEDS Skin   Total Care Does not communicate Normal                       Personal Care Assistance Level of Assistance  Total care       Total Care Assistance: Maximum assistance   Functional Limitations Info             SPECIAL CARE FACTORS FREQUENCY                       Contractures Contractures Info: Not present    Additional Factors Info  Code Status, Allergies Code Status Info: partial Allergies Info: nkda           Current Medications (08/21/2019):  This is the current hospital active medication list Current Facility-Administered Medications  Medication Dose Route Frequency Provider Last Rate Last Admin  . 0.9 %  sodium chloride infusion   Intravenous PRN Sidney Ace, MD   Stopped at 08/20/19 1448  . acetaminophen (TYLENOL) tablet 650 mg  650 mg Oral Q6H PRN Ivor Costa, MD       Or  . acetaminophen (TYLENOL) suppository 650 mg  650 mg Rectal Q6H PRN Ivor Costa, MD      . albuterol (PROVENTIL) (2.5 MG/3ML) 0.083% nebulizer solution 2.5 mg  2.5 mg Nebulization Q4H PRN Ivor Costa, MD      . Chlorhexidine Gluconate Cloth 2 % PADS 6 each  6 each Topical Daily Ivor Costa, MD   6 each at 08/21/19 0930  . enoxaparin (LOVENOX) injection 40 mg  40 mg Subcutaneous Q12H Ivor Costa, MD   40 mg at 08/21/19 0930  . levETIRAcetam (KEPPRA) tablet 1,500 mg  1,500 mg Oral BID Alexis Goodell, MD   1,500 mg at 08/21/19 0924  . LORazepam (ATIVAN) injection 2 mg  2 mg Intravenous Q2H PRN Ivor Costa, MD      . MEDLINE mouth rinse  15 mL Mouth Rinse BID Ivor Costa, MD   15 mL at 08/21/19 0931  . ondansetron (ZOFRAN) tablet 4 mg  4 mg Oral Q6H PRN Ivor Costa, MD       Or  . ondansetron [facility]) injection 4 mg  4 mg Intravenous Q6H PRN Ivor Costa, MD      .  phenobarbital (LUMINAL) tablet 48.6 mg  48.6 mg Oral TID Thana Farr, MD   48.6 mg at 08/21/19 0924  . phenytoin (DILANTIN) ER capsule 300 mg  300 mg Oral QHS Thana Farr, MD   300 mg at 08/20/19 2202     Discharge Medications: Please see discharge summary for a list of discharge medications.  Relevant Imaging Results:  Relevant Lab Results:   Additional Information SS# [id number]  Chapman Fitch, RN

## 2019-08-22 DIAGNOSIS — M6281 Muscle weakness (generalized): Secondary | ICD-10-CM | POA: Diagnosis not present

## 2019-08-22 DIAGNOSIS — R569 Unspecified convulsions: Secondary | ICD-10-CM | POA: Diagnosis not present

## 2019-08-22 DIAGNOSIS — F70 Mild intellectual disabilities: Secondary | ICD-10-CM | POA: Diagnosis not present

## 2019-08-23 DIAGNOSIS — D649 Anemia, unspecified: Secondary | ICD-10-CM | POA: Diagnosis not present

## 2019-08-23 DIAGNOSIS — Z79899 Other long term (current) drug therapy: Secondary | ICD-10-CM | POA: Diagnosis not present

## 2019-08-23 DIAGNOSIS — E875 Hyperkalemia: Secondary | ICD-10-CM | POA: Diagnosis not present

## 2019-08-23 DIAGNOSIS — G4089 Other seizures: Secondary | ICD-10-CM | POA: Diagnosis not present

## 2019-08-23 DIAGNOSIS — R569 Unspecified convulsions: Secondary | ICD-10-CM | POA: Diagnosis not present

## 2019-08-23 LAB — CULTURE, BLOOD (ROUTINE X 2)
Culture: NO GROWTH
Culture: NO GROWTH
Special Requests: ADEQUATE

## 2019-08-24 ENCOUNTER — Non-Acute Institutional Stay: Payer: Medicare Other | Admitting: Nurse Practitioner

## 2019-08-24 ENCOUNTER — Other Ambulatory Visit: Payer: Self-pay

## 2019-08-24 ENCOUNTER — Encounter: Payer: Self-pay | Admitting: Nurse Practitioner

## 2019-08-24 DIAGNOSIS — R569 Unspecified convulsions: Secondary | ICD-10-CM

## 2019-08-24 DIAGNOSIS — Z515 Encounter for palliative care: Secondary | ICD-10-CM | POA: Diagnosis not present

## 2019-08-24 NOTE — Progress Notes (Signed)
Therapist, nutritional Palliative Care Consult Note Telephone: [phone]  Fax: [phone]  PATIENT NAME: Mariah Jimenez DOB: [birth year] MRN: [id number]  PRIMARY CARE PROVIDER:   [facility], [facility]  REFERRING PROVIDER:  [place], [facility] [address] [place],  [place] [address]  PRIMARY CARE PROVIDER:Dr Hodges/[facility] RESPONSIBLE PARTY:Mariah Jimenez sister  RECOMMENDATIONS and PLAN: 1.ACP: DNR; focus on comfort care  2.Edema secondary tomorbid obesity, monitor weights, elevate  3.Debility secondary tomorbid obesity, encourage passive rom; encourage to transfer to geri-chair.  4. Seizure disorder; continue Keppra, phenobarbital   5.Palliative care encounter; Palliative medicine team will continue to support patient, patient's family, and medical team. Visit consisted of counseling and education dealing with the complex and emotionally intense issues of symptom management and palliative care in the setting of serious and potentially life-threatening illness  I spent 60 minutes providing this consultation,  At 2:30pm. More than 50% of the time in this consultation was spent coordinating communication.   HISTORY OF PRESENT ILLNESS:  Mariah Jimenez is a 74 y.o. year old female with multiple medical problems including mental retardation, intellectual disability, seizure disorder, chronic edema, morbid obesity.Mariah. Jimenez was hospitalized 08/18/2019 to 08/21/2019 unresponsive, intubated, seizure with UTi, hypothermia. Extubated, transitioned to [place], then stabilized, d/c back to [facility] [place]. Mariah Jimenez does continue to require assistance with mobility, transferring, total adl's, incontinent ballet and bladder. Mariah Jimenez does feed herself though requires tray set up and assistance. Appetite has been fair. Staff endorses Mariah Jimenez has been  more irritable at times, yelling out when roommate is yelling. No other concerns. At present Mariah Jimenez is lying in bed. Mariah Jimenez appears obese, no distress. No visitors present. I visited an observed Mariah Jimenez. Mariah. Jimenez did make eye contact with verbal cues. Mariah Jimenez was cooperative with assessment. Limited verbal discussion with cognitive impairment. It is very difficult to understand what Mariah Jimenez is trying to relay with words as this is her baseline. Emotional support provided. Medical goals of care reviewed. No new changes at present time. I have attempted to contact Mariah Jimenez, Mariah Jimenez's niece for update on palliative care visit. I updated nursing the new changes to current goals or plan of care. Palliative Care was asked to help to continue to address goals of care.   CODE STATUS: DNR  PPS: 30% HOSPICE ELIGIBILITY/DIAGNOSIS: TBD  PAST MEDICAL HISTORY:  Past Medical History:  Diagnosis Date  . Mental retardation   . Seizures (HCC)     SOCIAL HX:  Social History   Tobacco Use  . Smoking status: Never Smoker  . Smokeless tobacco: Never Used  Substance Use Topics  . Alcohol use: No    ALLERGIES: No Known Allergies   PERTINENT MEDICATIONS:  Outpatient Encounter Medications as of 08/24/2019  Medication Sig  . acetaminophen (TYLENOL) 325 MG tablet Take 650 mg by mouth every 6 (six) hours as needed.  . levETIRAcetam (KEPPRA) 500 MG tablet Take 1,500 mg by mouth 2 (two) times daily.  Marland Kitchen loratadine (CLARITIN) 10 MG tablet Take 10 mg by mouth daily.  Marland Kitchen PHENobarbital (LUMINAL) 16.2 MG tablet Take 3 tablets (48.6 mg total) by mouth 3 (three) times daily.  . phenytoin (DILANTIN) 100 MG ER capsule Take 300 mg by mouth at bedtime.  . SPS 15 GM/60ML suspension Take 60 mLs by mouth every Wednesday.  . Triamcinolone Acetonide (TRIAMCINOLONE 0.1 % CREAM : EUCERIN) CREA Apply 1 application  topically 3 (three) times daily.    No facility-administered encounter medications on file as of 08/24/2019.      PHYSICAL EXAM:   General: NAD, obese, debilitated cognitively impaired female Cardiovascular: regular rate and rhythm Pulmonary: clear ant fields Extremities: + edema, no joint deformities Neurological: functionally quadriplegic  Camren Lipsett Ihor Gully, NP

## 2019-08-28 DIAGNOSIS — R262 Difficulty in walking, not elsewhere classified: Secondary | ICD-10-CM | POA: Diagnosis not present

## 2019-08-28 DIAGNOSIS — R569 Unspecified convulsions: Secondary | ICD-10-CM | POA: Diagnosis not present

## 2019-08-28 DIAGNOSIS — F70 Mild intellectual disabilities: Secondary | ICD-10-CM | POA: Diagnosis not present

## 2019-09-12 DIAGNOSIS — S82221D Displaced transverse fracture of shaft of right tibia, subsequent encounter for closed fracture with routine healing: Secondary | ICD-10-CM | POA: Diagnosis not present

## 2019-09-12 DIAGNOSIS — R569 Unspecified convulsions: Secondary | ICD-10-CM | POA: Diagnosis not present

## 2019-09-12 DIAGNOSIS — J9601 Acute respiratory failure with hypoxia: Secondary | ICD-10-CM | POA: Diagnosis not present

## 2019-09-12 DIAGNOSIS — G4089 Other seizures: Secondary | ICD-10-CM | POA: Diagnosis not present

## 2019-09-13 DIAGNOSIS — R569 Unspecified convulsions: Secondary | ICD-10-CM | POA: Diagnosis not present

## 2019-09-13 DIAGNOSIS — Z79899 Other long term (current) drug therapy: Secondary | ICD-10-CM | POA: Diagnosis not present

## 2019-09-26 DIAGNOSIS — R569 Unspecified convulsions: Secondary | ICD-10-CM | POA: Diagnosis not present

## 2019-10-03 DIAGNOSIS — K5289 Other specified noninfective gastroenteritis and colitis: Secondary | ICD-10-CM | POA: Diagnosis not present

## 2019-10-03 DIAGNOSIS — K567 Ileus, unspecified: Secondary | ICD-10-CM | POA: Diagnosis not present

## 2019-10-03 DIAGNOSIS — R1110 Vomiting, unspecified: Secondary | ICD-10-CM | POA: Diagnosis not present

## 2019-10-03 DIAGNOSIS — N390 Urinary tract infection, site not specified: Secondary | ICD-10-CM | POA: Diagnosis not present

## 2019-10-03 DIAGNOSIS — D649 Anemia, unspecified: Secondary | ICD-10-CM | POA: Diagnosis not present

## 2019-10-04 ENCOUNTER — Emergency Department: Payer: Medicare Other

## 2019-10-04 ENCOUNTER — Other Ambulatory Visit: Payer: Self-pay

## 2019-10-04 ENCOUNTER — Inpatient Hospital Stay
Admission: EM | Admit: 2019-10-04 | Discharge: 2019-10-16 | DRG: 392 | Disposition: A | Discharge status: Skilled Nursing Facility | Payer: Medicare Other | Attending: Internal Medicine | Admitting: Internal Medicine

## 2019-10-04 DIAGNOSIS — N390 Urinary tract infection, site not specified: Secondary | ICD-10-CM | POA: Diagnosis present

## 2019-10-04 DIAGNOSIS — R748 Abnormal levels of other serum enzymes: Secondary | ICD-10-CM | POA: Insufficient documentation

## 2019-10-04 DIAGNOSIS — R404 Transient alteration of awareness: Secondary | ICD-10-CM | POA: Diagnosis not present

## 2019-10-04 DIAGNOSIS — Z66 Do not resuscitate: Secondary | ICD-10-CM | POA: Diagnosis present

## 2019-10-04 DIAGNOSIS — N3090 Cystitis, unspecified without hematuria: Secondary | ICD-10-CM | POA: Diagnosis not present

## 2019-10-04 DIAGNOSIS — Z20822 Contact with and (suspected) exposure to covid-19: Secondary | ICD-10-CM | POA: Diagnosis not present

## 2019-10-04 DIAGNOSIS — E871 Hypo-osmolality and hyponatremia: Secondary | ICD-10-CM | POA: Diagnosis not present

## 2019-10-04 DIAGNOSIS — E860 Dehydration: Secondary | ICD-10-CM | POA: Diagnosis present

## 2019-10-04 DIAGNOSIS — K2950 Unspecified chronic gastritis without bleeding: Secondary | ICD-10-CM | POA: Diagnosis present

## 2019-10-04 DIAGNOSIS — Z6841 Body Mass Index (BMI) 40.0 and over, adult: Secondary | ICD-10-CM | POA: Diagnosis not present

## 2019-10-04 DIAGNOSIS — R140 Abdominal distension (gaseous): Secondary | ICD-10-CM

## 2019-10-04 DIAGNOSIS — Z79899 Other long term (current) drug therapy: Secondary | ICD-10-CM

## 2019-10-04 DIAGNOSIS — Z7401 Bed confinement status: Secondary | ICD-10-CM | POA: Diagnosis not present

## 2019-10-04 DIAGNOSIS — R1110 Vomiting, unspecified: Secondary | ICD-10-CM

## 2019-10-04 DIAGNOSIS — E876 Hypokalemia: Secondary | ICD-10-CM | POA: Diagnosis not present

## 2019-10-04 DIAGNOSIS — L89152 Pressure ulcer of sacral region, stage 2: Secondary | ICD-10-CM | POA: Diagnosis not present

## 2019-10-04 DIAGNOSIS — R110 Nausea: Secondary | ICD-10-CM | POA: Diagnosis not present

## 2019-10-04 DIAGNOSIS — K208 Other esophagitis without bleeding: Principal | ICD-10-CM | POA: Diagnosis present

## 2019-10-04 DIAGNOSIS — R52 Pain, unspecified: Secondary | ICD-10-CM | POA: Diagnosis not present

## 2019-10-04 DIAGNOSIS — K3184 Gastroparesis: Secondary | ICD-10-CM | POA: Diagnosis present

## 2019-10-04 DIAGNOSIS — R112 Nausea with vomiting, unspecified: Secondary | ICD-10-CM

## 2019-10-04 DIAGNOSIS — D3502 Benign neoplasm of left adrenal gland: Secondary | ICD-10-CM | POA: Diagnosis not present

## 2019-10-04 DIAGNOSIS — G40909 Epilepsy, unspecified, not intractable, without status epilepticus: Secondary | ICD-10-CM | POA: Diagnosis present

## 2019-10-04 DIAGNOSIS — B0089 Other herpesviral infection: Secondary | ICD-10-CM

## 2019-10-04 DIAGNOSIS — Z0189 Encounter for other specified special examinations: Secondary | ICD-10-CM

## 2019-10-04 DIAGNOSIS — K317 Polyp of stomach and duodenum: Secondary | ICD-10-CM

## 2019-10-04 DIAGNOSIS — R1111 Vomiting without nausea: Secondary | ICD-10-CM | POA: Diagnosis not present

## 2019-10-04 DIAGNOSIS — F79 Unspecified intellectual disabilities: Secondary | ICD-10-CM | POA: Diagnosis not present

## 2019-10-04 DIAGNOSIS — R109 Unspecified abdominal pain: Secondary | ICD-10-CM | POA: Diagnosis not present

## 2019-10-04 DIAGNOSIS — R319 Hematuria, unspecified: Secondary | ICD-10-CM | POA: Diagnosis not present

## 2019-10-04 DIAGNOSIS — R000 Tachycardia, unspecified: Secondary | ICD-10-CM

## 2019-10-04 DIAGNOSIS — L8990 Pressure ulcer of unspecified site, unspecified stage: Secondary | ICD-10-CM | POA: Insufficient documentation

## 2019-10-04 DIAGNOSIS — R0989 Other specified symptoms and signs involving the circulatory and respiratory systems: Secondary | ICD-10-CM | POA: Diagnosis not present

## 2019-10-04 DIAGNOSIS — R197 Diarrhea, unspecified: Secondary | ICD-10-CM | POA: Diagnosis not present

## 2019-10-04 LAB — CBC
HCT: 35.1 % — ABNORMAL LOW (ref 36.0–46.0)
Hemoglobin: 11.4 g/dL — ABNORMAL LOW (ref 12.0–15.0)
MCH: 27.5 pg (ref 26.0–34.0)
MCHC: 32.5 g/dL (ref 30.0–36.0)
MCV: 84.6 fL (ref 80.0–100.0)
Platelets: 365 10*3/uL (ref 150–400)
RBC: 4.15 MIL/uL (ref 3.87–5.11)
RDW: 16 % — ABNORMAL HIGH (ref 11.5–15.5)
WBC: 9.2 10*3/uL (ref 4.0–10.5)
nRBC: 0 % (ref 0.0–0.2)

## 2019-10-04 LAB — URINALYSIS, COMPLETE (UACMP) WITH MICROSCOPIC
Bilirubin Urine: NEGATIVE
Glucose, UA: NEGATIVE mg/dL
Ketones, ur: 5 mg/dL — AB
Nitrite: NEGATIVE
Protein, ur: 30 mg/dL — AB
Specific Gravity, Urine: >1.046 — ABNORMAL HIGH (ref 1.005–1.030)
pH: 7 (ref 5.0–8.0)

## 2019-10-04 LAB — COMPREHENSIVE METABOLIC PANEL
ALT: 21 U/L (ref 0–44)
AST: 24 U/L (ref 15–41)
Albumin: 2.6 g/dL — ABNORMAL LOW (ref 3.5–5.0)
Alkaline Phosphatase: 203 U/L — ABNORMAL HIGH (ref 38–126)
Anion gap: 13 (ref 5–15)
BUN: 31 mg/dL — ABNORMAL HIGH (ref 8–23)
CO2: 23 mmol/L (ref 22–32)
Calcium: 8.7 mg/dL — ABNORMAL LOW (ref 8.9–10.3)
Chloride: 97 mmol/L — ABNORMAL LOW (ref 98–111)
Creatinine, Ser: 0.79 mg/dL (ref 0.44–1.00)
GFR calc Af Amer: >60 mL/min (ref >60)
GFR calc non Af Amer: >60 mL/min (ref >60)
Glucose, Bld: 99 mg/dL (ref 70–99)
Potassium: 3.4 mmol/L — ABNORMAL LOW (ref 3.5–5.1)
Sodium: 133 mmol/L — ABNORMAL LOW (ref 135–145)
Total Bilirubin: 1.2 mg/dL (ref 0.3–1.2)
Total Protein: 8 g/dL (ref 6.5–8.1)

## 2019-10-04 LAB — SARS CORONAVIRUS 2 BY RT PCR (HOSPITAL ORDER, PERFORMED IN ~~LOC~~ HOSPITAL LAB): SARS Coronavirus 2: NEGATIVE

## 2019-10-04 LAB — LIPASE, BLOOD: Lipase: 55 U/L — ABNORMAL HIGH (ref 11–51)

## 2019-10-04 LAB — TROPONIN I (HIGH SENSITIVITY): Troponin I (High Sensitivity): 6 ng/L (ref <18)

## 2019-10-04 LAB — LACTIC ACID, PLASMA: Lactic Acid, Venous: 1 mmol/L (ref 0.5–1.9)

## 2019-10-04 MED ORDER — PHENOBARBITAL 64.8 MG PO TABS
64.8000 mg | ORAL_TABLET | Freq: Once | ORAL | Status: AC
  Administered 2019-10-04: 64.8 mg via ORAL
  Filled 2019-10-04: qty 1

## 2019-10-04 MED ORDER — LEVETIRACETAM 500 MG PO TABS
1500.0000 mg | ORAL_TABLET | Freq: Once | ORAL | Status: AC
  Administered 2019-10-04: 1500 mg via ORAL
  Filled 2019-10-04: qty 3

## 2019-10-04 MED ORDER — ONDANSETRON HCL 4 MG/2ML IJ SOLN
4.0000 mg | Freq: Once | INTRAMUSCULAR | Status: AC
  Administered 2019-10-04: 4 mg via INTRAVENOUS
  Filled 2019-10-04: qty 2

## 2019-10-04 MED ORDER — ONDANSETRON HCL 4 MG PO TABS: 4.0000 mg | ORAL_TABLET | Freq: Every day | ORAL | 0 refills | Status: DC | PRN

## 2019-10-04 MED ORDER — LORAZEPAM 1 MG PO TABS
1.0000 mg | ORAL_TABLET | Freq: Once | ORAL | Status: AC
  Administered 2019-10-04: 1 mg via ORAL
  Filled 2019-10-04: qty 1

## 2019-10-04 MED ORDER — ACETAMINOPHEN 325 MG PO TABS: 650.0000 mg | ORAL_TABLET | Freq: Four times a day (QID) | ORAL | Status: DC | PRN

## 2019-10-04 MED ORDER — SODIUM CHLORIDE 0.9 % IV SOLN
300.0000 mg | Freq: Every day | INTRAVENOUS | Status: DC
  Filled 2019-10-04: qty 6

## 2019-10-04 MED ORDER — ENOXAPARIN SODIUM 40 MG/0.4ML ~~LOC~~ SOLN
40.0000 mg | SUBCUTANEOUS | Status: DC
  Administered 2019-10-04 – 2019-10-11 (×9): 40 mg via SUBCUTANEOUS
  Filled 2019-10-04 (×8): qty 0.4

## 2019-10-04 MED ORDER — SODIUM CHLORIDE 0.9 % IV SOLN
1.0000 g | Freq: Once | INTRAVENOUS | Status: AC
  Administered 2019-10-04: 1 g via INTRAVENOUS
  Filled 2019-10-04: qty 10

## 2019-10-04 MED ORDER — POTASSIUM CHLORIDE IN NACL 20-0.9 MEQ/L-% IV SOLN
INTRAVENOUS | Status: AC
  Filled 2019-10-04: qty 1000

## 2019-10-04 MED ORDER — FOSFOMYCIN TROMETHAMINE 3 G PO PACK
3.0000 g | PACK | Freq: Once | ORAL | Status: AC
  Administered 2019-10-04: 3 g via ORAL
  Filled 2019-10-04: qty 3

## 2019-10-04 MED ORDER — ONDANSETRON HCL 4 MG/2ML IJ SOLN: 4.0000 mg | Freq: Four times a day (QID) | INTRAMUSCULAR | Status: DC | PRN

## 2019-10-04 MED ORDER — PHENYTOIN SODIUM EXTENDED 100 MG PO CAPS
100.0000 mg | ORAL_CAPSULE | Freq: Once | ORAL | Status: AC
  Administered 2019-10-04: 100 mg via ORAL
  Filled 2019-10-04: qty 1

## 2019-10-04 MED ORDER — ONDANSETRON HCL 4 MG PO TABS: 4.0000 mg | ORAL_TABLET | Freq: Four times a day (QID) | ORAL | Status: DC | PRN

## 2019-10-04 MED ORDER — SODIUM CHLORIDE 0.9 % IV BOLUS
500.0000 mL | Freq: Once | INTRAVENOUS | Status: AC
  Administered 2019-10-04: 500 mL via INTRAVENOUS

## 2019-10-04 MED ORDER — LORATADINE 10 MG PO TABS
10.0000 mg | ORAL_TABLET | Freq: Every day | ORAL | Status: DC
  Administered 2019-10-06 – 2019-10-16 (×9): 10 mg via ORAL
  Filled 2019-10-04 (×10): qty 1

## 2019-10-04 MED ORDER — IOHEXOL 350 MG/ML SOLN
100.0000 mL | Freq: Once | INTRAVENOUS | Status: AC | PRN
  Administered 2019-10-04: 100 mL via INTRAVENOUS

## 2019-10-04 MED ORDER — SODIUM CHLORIDE 0.9 % IV SOLN
1.0000 g | INTRAVENOUS | Status: DC
  Administered 2019-10-05 – 2019-10-06 (×2): 1 g via INTRAVENOUS
  Filled 2019-10-04 (×2): qty 1
  Filled 2019-10-04: qty 10

## 2019-10-04 MED ORDER — ACETAMINOPHEN 650 MG RE SUPP: 650.0000 mg | Freq: Four times a day (QID) | RECTAL | Status: DC | PRN

## 2019-10-04 MED ORDER — SODIUM CHLORIDE 0.9 % IV SOLN: 1.0000 g | Freq: Once | INTRAVENOUS | Status: DC

## 2019-10-04 MED ORDER — IOHEXOL 300 MG/ML  SOLN: 100.0000 mL | Freq: Once | INTRAMUSCULAR | Status: DC | PRN

## 2019-10-04 MED ORDER — METOCLOPRAMIDE HCL 5 MG/ML IJ SOLN
10.0000 mg | Freq: Once | INTRAMUSCULAR | Status: AC
  Administered 2019-10-04: 10 mg via INTRAVENOUS
  Filled 2019-10-04: qty 2

## 2019-10-04 MED ORDER — LEVETIRACETAM IN NACL 500 MG/100ML IV SOLN
500.0000 mg | Freq: Two times a day (BID) | INTRAVENOUS | Status: DC
  Administered 2019-10-05: 12:00:00 500 mg via INTRAVENOUS
  Filled 2019-10-04 (×3): qty 100

## 2019-10-04 MED ORDER — PHENOBARBITAL SODIUM 65 MG/ML IJ SOLN
65.0000 mg | Freq: Two times a day (BID) | INTRAMUSCULAR | Status: DC
  Administered 2019-10-05: 12:00:00 65 mg via INTRAVENOUS
  Filled 2019-10-04: qty 1

## 2019-10-04 NOTE — ED Provider Notes (Signed)
[facility] Emergency Department Provider Note    First MD Initiated Contact with Patient 10/04/19 1232     (approximate)  I have reviewed the triage vital signs and the nursing notes.   HISTORY  Chief Complaint Emesis and Diarrhea  Level V Caveat:  Poor historian - MR  HPI Rosalind Guido is a 74 y.o. female presents to the ER with the below listed past medical history for evaluation of 3 days of nausea vomiting with an episode of diarrhea.  Reportedly there was a portable x-ray performed suggestive of SBO so she was sent to the ER for evaluation.  Patient does complain some mild abdominal pain.  Given her MR is not able to provide much further history.    Past Medical History:  Diagnosis Date  . Mental retardation   . Seizures (HCC)    Family History  Family history unknown: Yes   Past Surgical History:  Procedure Laterality Date  . NO PAST SURGERIES    . TIBIA IM NAIL INSERTION Right 04/09/2016   Procedure: INTRAMEDULLARY (IM) NAIL TIBIAL;  Surgeon: Christena Flake, MD;  Location: [facility] ORS;  Service: Orthopedics;  Laterality: Right;   Patient Active Problem List   Diagnosis Date Noted  . Intractable nausea and vomiting 10/04/2019  . Intellectual disability 10/04/2019  . Elevated lipase 10/04/2019  . Seizure disorder (HCC) 08/18/2019  . UTI (urinary tract infection) 08/18/2019  . Hypothermia 08/18/2019  . Acute respiratory failure with hypoxia (HCC) 08/18/2019  . Palliative care encounter 10/06/2018  . Debility 10/06/2018  . Tibia/fibula fracture 04/09/2016      Prior to Admission medications   Medication Sig Start Date End Date Taking? Authorizing Provider  levETIRAcetam (KEPPRA) 500 MG tablet Take 1,500 mg by mouth 2 (two) times daily.   Yes [provider]  loratadine (CLARITIN) 10 MG tablet Take 10 mg by mouth daily.   Yes [provider]  PHENobarbital (LUMINAL) 16.2 MG tablet Take 3 tablets (48.6 mg total) by mouth  3 (three) times daily. 08/21/19  Yes Sreenath, Sudheer B, MD  phenytoin (DILANTIN) 100 MG ER capsule Take 300 mg by mouth at bedtime.   Yes [provider]  promethazine (PHENERGAN) 25 MG tablet Take 25-50 mg by mouth every 8 (eight) hours as needed. 10/02/19  Yes [provider]  SPS 15 GM/60ML suspension Take 60 mLs by mouth every Wednesday. 06/16/19  Yes [provider]  Triamcinolone Acetonide (TRIAMCINOLONE 0.1 % CREAM : EUCERIN) CREA Apply 1 application topically 3 (three) times daily.    Yes [provider]  acetaminophen (TYLENOL) 325 MG tablet Take 650 mg by mouth every 6 (six) hours as needed.    [provider]  ondansetron (ZOFRAN) 4 MG tablet Take 1 tablet (4 mg total) by mouth daily as needed. 10/04/19 10/03/20  Willy Eddy, MD    Allergies Patient has no known allergies.    Social History Social History   Tobacco Use  . Smoking status: Never Smoker  . Smokeless tobacco: Never Used  Substance Use Topics  . Alcohol use: No  . Drug use: No    Review of Systems Patient denies headaches, rhinorrhea, blurry vision, numbness, shortness of breath, chest pain, edema, cough, abdominal pain, nausea, vomiting, diarrhea, dysuria, fevers, rashes or hallucinations unless otherwise stated above in HPI. ____________________________________________   PHYSICAL EXAM:  VITAL SIGNS: Vitals:   10/04/19 1700 10/04/19 1805  BP: 110/86 (!) 149/88  Pulse: (!) 102 (!) 120  Resp: 16  16  Temp:    SpO2: 100% 95%    Constitutional: Alert, obese, does not appear in acute distress Eyes: Conjunctivae are normal.  Head: Atraumatic. Nose: No congestion/rhinnorhea. Mouth/Throat: Mucous membranes are moist.   Neck: No stridor. Painless ROM.  Cardiovascular: Normal rate, regular rhythm. Grossly normal heart sounds.  Good peripheral circulation. Respiratory: Normal respiratory effort.  No retractions. Lungs CTAB. Gastrointestinal: obese and soft  with generalized ttp,  Hypoactive BS. Marland Kitchen No distention. No abdominal bruits. No CVA tenderness. Genitourinary:  Musculoskeletal: No lower extremity tenderness, 1+ BLE edema.  No joint effusions. Neurologic:  . No new gross focal neurologic deficits are appreciated. No facial droop Skin:  Skin is warm, dry and intact. No rash noted. Psychiatric: Calm and cooperative ____________________________________________   LABS (all labs ordered are listed, but only abnormal results are displayed)  Results for orders placed or performed during the hospital encounter of 10/04/19 (from the past 24 hour(s))  Lipase, blood     Status: Abnormal   Collection Time: 10/04/19 12:44 PM  Result Value Ref Range   Lipase 55 (H) 11 - 51 U/L  Comprehensive metabolic panel     Status: Abnormal   Collection Time: 10/04/19 12:44 PM  Result Value Ref Range   Sodium 133 (L) 135 - 145 mmol/L   Potassium 3.4 (L) 3.5 - 5.1 mmol/L   Chloride 97 (L) 98 - 111 mmol/L   CO2 23 22 - 32 mmol/L   Glucose, Bld 99 70 - 99 mg/dL   BUN 31 (H) 8 - 23 mg/dL   Creatinine, Ser 3.90 0.44 - 1.00 mg/dL   Calcium 8.7 (L) 8.9 - 10.3 mg/dL   Total Protein 8.0 6.5 - 8.1 g/dL   Albumin 2.6 (L) 3.5 - 5.0 g/dL   AST 24 15 - 41 U/L   ALT 21 0 - 44 U/L   Alkaline Phosphatase 203 (H) 38 - 126 U/L   Total Bilirubin 1.2 0.3 - 1.2 mg/dL   GFR calc non Af Amer >60 >60 mL/min   GFR calc Af Amer >60 >60 mL/min   Anion gap 13 5 - 15  CBC     Status: Abnormal   Collection Time: 10/04/19 12:44 PM  Result Value Ref Range   WBC 9.2 4.0 - 10.5 K/uL   RBC 4.15 3.87 - 5.11 MIL/uL   Hemoglobin 11.4 (L) 12.0 - 15.0 g/dL   HCT 30.0 (L) 36 - 46 %   MCV 84.6 80.0 - 100.0 fL   MCH 27.5 26.0 - 34.0 pg   MCHC 32.5 30.0 - 36.0 g/dL   RDW 92.3 (H) 30.0 - 76.2 %   Platelets 365 150 - 400 K/uL   nRBC 0.0 0.0 - 0.2 %  Urinalysis, Complete w Microscopic     Status: Abnormal   Collection Time: 10/04/19 12:44 PM  Result Value Ref Range   Color, Urine YELLOW  (A) YELLOW   APPearance HAZY (A) CLEAR   Specific Gravity, Urine >1.046 (H) 1.005 - 1.030   pH 7.0 5.0 - 8.0   Glucose, UA NEGATIVE NEGATIVE mg/dL   Hgb urine dipstick SMALL (A) NEGATIVE   Bilirubin Urine NEGATIVE NEGATIVE   Ketones, ur 5 (A) NEGATIVE mg/dL   Protein, ur 30 (A) NEGATIVE mg/dL   Nitrite NEGATIVE NEGATIVE   Leukocytes,Ua LARGE (A) NEGATIVE   RBC / HPF 21-50 0 - 5 RBC/hpf   WBC, UA 11-20 0 - 5 WBC/hpf   Bacteria, UA FEW (A) NONE SEEN   Squamous  Epithelial / LPF 11-20 0 - 5   Triple Phosphate Crystal PRESENT    Ca Oxalate Crys, UA PRESENT   Lactic acid, plasma     Status: None   Collection Time: 10/04/19 12:44 PM  Result Value Ref Range   Lactic Acid, Venous 1.0 0.5 - 1.9 mmol/L  Troponin I (High Sensitivity)     Status: None   Collection Time: 10/04/19 12:44 PM  Result Value Ref Range   Troponin I (High Sensitivity) 6 <18 ng/L   ____________________________________________  EKG My review and personal interpretation at Time: 18:48   Indication: tachycardia  Rate: 120  Rhythm: sinus Axis: normal Other: normal intervals, non specific st abn, no stemi ____________________________________________  RADIOLOGY  I personally reviewed all radiographic images ordered to evaluate for the above acute complaints and reviewed radiology reports and findings.  These findings were personally discussed with the patient.  Please see medical record for radiology report.  ____________________________________________   PROCEDURES  Procedure(s) performed:  Procedures    Critical Care performed: no ____________________________________________   INITIAL IMPRESSION / ASSESSMENT AND PLAN / ED COURSE  Pertinent labs & imaging results that were available during my care of the patient were reviewed by me and considered in my medical decision making (see chart for details).   DDX: sbo, ileus, colitis, gastritis, gastroparesis, acs, MI  Avigayil Ton is a 74 y.o. who presents  to the ED with symptoms as described above.  Presentation somewhat difficult due to the patient being very poor historian with past medical history as listed below.  Imaging of the abdomen will be ordered will order blood work as well as provide IV fluids and antiemetic and reassess.  Clinical Course as of Oct 04 2026  Thu Oct 04, 2019  1426 Discussed CT results with patient and family.  Will order right upper quadrant ultrasound to evaluate for possible cholecystitis that she does not have any fever or white count.  Still awaiting urinalysis.   [PR]  1524 Ultrasound is reassuring.  Not consistent with cholecystitis particularly in the absence of leukocytosis.  I will cover for cystitis with antibiotics and make sure the patient is tolerating p.o. she does have concentrated urine have given IV fluids.   [PR]  1615 Patient tolerating p.o.  Will give single dose of oral fosfomycin she not having signs of pyelonephritis and family would prefer single dose option to ensure that she is appropriately receiving antibiotics will also add on a urine culture.  Will prescribe antiemetic.   [PR]  1706 Patient's family member stating patient appears to be having trouble swallowing.  Will perform swallow screen or chest x-ray and reassess.   [PR]  1727 Patient passing swallow screen and after observation of her tolerating   [PR]  1727 We will give a little bit more fluids we will give cement papplesauce does not seem to be having issues.  Chest x-ray ordered to see if she is aspirating she not have any hypoxia.  Will give additional antiemetics as she is having more nausea.   [PR]  1813 Patient appears somewhat anxious but nontoxic.  Chest x-ray noted for possible CHF and cardiomegaly which is not hypoxic.  I doubt CHF.  On review of history patient is DNR with palliative care following.  Ordered her evening meds but I suspect will be stable and appropriate for discharge back to facility   [PR]  1832 Patient  does appear anxious.  Does have mild tachycardia which I suspect is secondary to  Reglan but also she has becoming more agitated will trial Ativan.     [PR]  1959 Patient now having persistent vomiting.  No fever but given her persistent tachycardia despite anxiolysis fluids with evidence of cystitis on CT no vomiting not tolerating her p.o. antibiotics will give Rocephin and consult hospitalist for admission.   [PR]    Clinical Course User Index [PR] Merlyn Lot, MD    The patient was evaluated in Emergency Department today for the symptoms described in the history of present illness. He/she was evaluated in the context of the global COVID-19 pandemic, which necessitated consideration that the patient might be at risk for infection with the SARS-CoV-2 virus that causes COVID-19. Institutional protocols and algorithms that pertain to the evaluation of patients at risk for COVID-19 are in a state of rapid change based on information released by regulatory bodies including the CDC and federal and state organizations. These policies and algorithms were followed during the patient's care in the ED.  As part of my medical decision making, I reviewed the following data within the [facility] notes reviewed and incorporated, Labs reviewed, notes from prior ED visits and [place] Controlled Substance Database   ____________________________________________   FINAL CLINICAL IMPRESSION(S) / ED DIAGNOSES  Final diagnoses:  Nausea and vomiting  Dehydration  Intractable vomiting with nausea, unspecified vomiting type  Tachycardia      NEW MEDICATIONS STARTED DURING THIS VISIT:  New Prescriptions   ONDANSETRON (ZOFRAN) 4 MG TABLET    Take 1 tablet (4 mg total) by mouth daily as needed.     Note:  This document was prepared using Dragon voice recognition software and may include unintentional dictation errors.    Merlyn Lot, MD 10/04/19 2028

## 2019-10-04 NOTE — ED Notes (Signed)
Rn at [facility] said patient was administered medications at 0900 today, she spit them out due to nausea/vomiting.Phenobarbital dosage times are 0900/1600/2200. Phenytoin and Levetiracetam are given at 2100.

## 2019-10-04 NOTE — ED Triage Notes (Signed)
Pt to ED via [facility] from [facility] Per EMS faciltiy states pt with N/V/D x3 days. Portable x-ray done at faciltiy and wants pt to be evaluated for bowel obstruction. Pt afebrile.   Upon arrival pt in NAD. VSS. Pt intellectual deficits at baseline and acting appropriate per EMS.

## 2019-10-04 NOTE — ED Notes (Addendum)
Pt transported to xray 

## 2019-10-04 NOTE — H&P (Signed)
History and Physical    Mariah Jimenez HAF:[id number] DOB: June 25, 1945 DOA: 10/04/2019  PCP: Selena Lesser, [facility]   Patient coming from: aALF  I have personally briefly reviewed patient's old medical records in [place]  Chief Complaint: Nausea and vomiting  HPI: Mariah Jimenez is a 74 y.o. female with medical history significant for intellectual disability and seizure disorder who presents to the emergency room with a 3-day history of nausea and vomiting with a single episode of diarrhea.  She was unable to take her seizure medicines this morning due to the vomiting.  She has associated mild abdominal discomfort which she is unable to describe further.  History is limited due to patient's intellectual disability and is taken from ER and nursing home records.  She had an outpatient abdominal x-ray that was concerning for small bowel obstruction so she was sent into the emergency room for evaluation. ED Course: On arrival, she was tachycardic ranging from 100-120 with normal blood pressure of 130/86 and otherwise normal vitals.  Blood work significant for normal WBC of 9.2, hemoglobin 11.4, mild hypokalemia and hyponatremia, elevated alk phos of 203 and lipase 55.  Lactic acid was normal.  Urinalysis showed large leukocyte esterase.  Patient had a CT abdomen and pelvis that was concerning for cystitis as well as possible early cholecystitis given tiny amount of pericholecystic fluid and slight pericholecystic haziness.  She subsequently underwent a right upper quadrant sonogram that showed no acute findings.  Patient was hydrated and started on IV Rocephin for UTI.  Hospitalist consulted for admission  Review of Systems: Unable to obtain reliably due to intellectual disability   Past Medical History:  Diagnosis Date  . Mental retardation   . Seizures ([place])     Past Surgical History:  Procedure Laterality Date  . NO PAST SURGERIES    . TIBIA IM NAIL INSERTION  Right 04/09/2016   Procedure: INTRAMEDULLARY (IM) NAIL TIBIAL;  Surgeon: Corky Mull, MD;  Location: [facility] ORS;  Service: Orthopedics;  Laterality: Right;     reports that she has never smoked. She has never used smokeless tobacco. She reports that she does not drink alcohol and does not use drugs.  No Known Allergies  Family History  Family history unknown: Yes  Family history reviewed and nonpertinent   Prior to Admission medications   Medication Sig Start Date End Date Taking? Authorizing Provider  levETIRAcetam (KEPPRA) 500 MG tablet Take 1,500 mg by mouth 2 (two) times daily.   Yes [provider]  loratadine (CLARITIN) 10 MG tablet Take 10 mg by mouth daily.   Yes [provider]  PHENobarbital (LUMINAL) 16.2 MG tablet Take 3 tablets (48.6 mg total) by mouth 3 (three) times daily. 08/21/19  Yes Sreenath, Sudheer B, MD  phenytoin (DILANTIN) 100 MG ER capsule Take 300 mg by mouth at bedtime.   Yes [provider]  promethazine (PHENERGAN) 25 MG tablet Take 25-50 mg by mouth every 8 (eight) hours as needed. 10/02/19  Yes [provider]  SPS 15 GM/60ML suspension Take 60 mLs by mouth every Wednesday. 06/16/19  Yes [provider]  Triamcinolone Acetonide (TRIAMCINOLONE 0.1 % CREAM : EUCERIN) CREA Apply 1 application topically 3 (three) times daily.    Yes [provider]  acetaminophen (TYLENOL) 325 MG tablet Take 650 mg by mouth every 6 (six) hours as needed.    [provider]  ondansetron (ZOFRAN) 4 MG tablet Take 1 tablet (4 mg total) by mouth daily  as needed. 10/04/19 10/03/20  Merlyn Lot, MD    Physical Exam: Vitals:   10/04/19 1600 10/04/19 1630 10/04/19 1700 10/04/19 1805  BP: 136/79 (!) 130/111 110/86 (!) 149/88  Pulse: (!) 109 (!) 111 (!) 102 (!) 120  Resp: _0 Temp:      TempSrc:      SpO2: 100% 97% 100% 95%  Weight:      Height:         Vitals:   10/04/19 1600 10/04/19 1630 10/04/19 1700  10/04/19 1805  BP: 136/79 (!) 130/111 110/86 (!) 149/88  Pulse: (!) 109 (!) 111 (!) 102 (!) 120  Resp: _1 Temp:      TempSrc:      SpO2: 100% 97% 100% 95%  Weight:      Height:          Constitutional:  Mildly lethargic, though awake, able to make eye contact.  Very restless.  Unable to assess orientation due to mental retardation but is likely moderate to severe.  Appears somewhat tachypneic HEENT:      Head: Normocephalic and atraumatic.         Eyes: PERLA, EOMI, Conjunctivae are normal. Sclera is non-icteric.       Mouth/Throat: Mucous membranes are moist.       Neck: Supple with no signs of meningismus. Cardiovascular:  Sinus tachycardia. No murmurs, gallops, or rubs. 2+ symmetrical distal pulses are present . No JVD. No LE edema Respiratory: Respiratory effort increased.Lungs sounds diminished bilaterally bilaterally. No wheezes, crackles, or rhonchi.  Gastrointestinal: Soft, non tender but mildly distended with positive bowel sounds. No rebound or guarding. Genitourinary: No CVA tenderness. Musculoskeletal: Nontender with normal range of motion in all extremities. No cyanosis, or erythema of extremities. Neurologic: Patient speech is unintelligible. Face is symmetric. Moving all extremities. No gross focal neurologic deficits . Skin: Skin is warm, dry.  No rash or ulcers Psychiatric: Difficult to assess.  Uncertain of patient's baseline.  She appears restless  Labs on Admission: I have personally reviewed following labs and imaging studies  CBC: Recent Labs  Lab 10/04/19 1244  WBC 9.2  HGB 11.4*  HCT 35.1*  MCV 84.6  PLT 389   Basic Metabolic Panel: Recent Labs  Lab 10/04/19 1244  NA 133*  K 3.4*  CL 97*  CO2 23  GLUCOSE 99  BUN 31*  CREATININE 0.79  CALCIUM 8.7*   GFR: Estimated Creatinine Clearance: 92.6 mL/min (by C-G formula based on SCr of 0.79 mg/dL). Liver Function Tests: Recent Labs  Lab 10/04/19 1244  AST 24  ALT 21  ALKPHOS 203*    BILITOT 1.2  PROT 8.0  ALBUMIN 2.6*   Recent Labs  Lab 10/04/19 1244  LIPASE 55*   No results for input(s): AMMONIA in the last 168 hours. Coagulation Profile: No results for input(s): INR, PROTIME in the last 168 hours. Cardiac Enzymes: No results for input(s): CKTOTAL, CKMB, CKMBINDEX, TROPONINI in the last 168 hours. BNP (last 3 results) No results for input(s): PROBNP in the last 8760 hours. HbA1C: No results for input(s): HGBA1C in the last 72 hours. CBG: No results for input(s): GLUCAP in the last 168 hours. Lipid Profile: No results for input(s): CHOL, HDL, LDLCALC, TRIG, CHOLHDL, LDLDIRECT in the last 72 hours. Thyroid Function Tests: No results for input(s): TSH, T4TOTAL, FREET4, T3FREE, THYROIDAB in the last 72 hours. Anemia Panel: No results for input(s): VITAMINB12, FOLATE, FERRITIN, TIBC, IRON, RETICCTPCT in the last  72 hours. Urine analysis:    Component Value Date/Time   COLORURINE YELLOW (A) 10/04/2019 1244   APPEARANCEUR HAZY (A) 10/04/2019 1244   LABSPEC >1.046 (H) 10/04/2019 1244   PHURINE 7.0 10/04/2019 1244   GLUCOSEU NEGATIVE 10/04/2019 1244   HGBUR SMALL (A) 10/04/2019 1244   BILIRUBINUR NEGATIVE 10/04/2019 1244   KETONESUR 5 (A) 10/04/2019 1244   PROTEINUR 30 (A) 10/04/2019 1244   UROBILINOGEN 0.2 05/20/2013 0437   NITRITE NEGATIVE 10/04/2019 1244   LEUKOCYTESUR LARGE (A) 10/04/2019 1244    Radiological Exams on Admission: DG Chest 2 View  Result Date: 10/04/2019 CLINICAL DATA:  Nausea vomiting EXAM: CHEST - 2 VIEW COMPARISON:  08/18/2019 FINDINGS: Limited lateral view due to soft tissue artifact. Cardiomegaly with central vascular congestion. Patchy atelectasis at the left base. No definitive effusion. No pneumothorax. IMPRESSION: Cardiomegaly with central vascular congestion. Probable patchy atelectasis left base Electronically Signed   By: Donavan Foil M.D.   On: 10/04/2019 17:57   CT ABDOMEN PELVIS W CONTRAST  Result Date:  10/04/2019 CLINICAL DATA:  Nausea, vomiting, and diarrhea. EXAM: CT ABDOMEN AND PELVIS WITH CONTRAST TECHNIQUE: Multidetector CT imaging of the abdomen and pelvis was performed using the standard protocol following bolus administration of intravenous contrast. CONTRAST:  138m OMNIPAQUE IOHEXOL 350 MG/ML SOLN COMPARISON:  Abdominal radiograph dated 08/18/2019 FINDINGS: Lower chest: Tiny bilateral pleural effusions with slight bibasilar atelectasis. Cardiomegaly. Hepatobiliary: Liver parenchyma is normal. There is a tiny amount of pericholecystic fluid and slight pericholecystic haziness with the gallbladder is not distended in the wall is not thickened. No visible stones. No biliary ductal dilatation. Pancreas: Unremarkable. No pancreatic ductal dilatation or surrounding inflammatory changes. Spleen: Normal in size without focal abnormality. 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 Right adrenal gland is normal. Small benign-appearing cysts in both kidneys. No hydronephrosis. There is thickening of the bladder wall haziness around the bladder with enhancement of the mucosa, suggestive of cystitis. Stomach/Bowel: Stomach is within normal limits. Appendix appears normal. No evidence of bowel wall thickening, distention, or inflammatory changes. Vascular/Lymphatic: No significant vascular findings are present. No enlarged abdominal or pelvic lymph nodes. Reproductive: Uterus and bilateral adnexa are unremarkable. Other: No abdominal wall hernia or abnormality. No abdominopelvic ascites. Musculoskeletal: No acute or significant osseous findings. IMPRESSION: 1. Findings consistent with cystitis. 2. Tiny amount of pericholecystic fluid and slight pericholecystic haziness without gallbladder wall thickening or visible stones. This could represent early cholecystitis. 3. Tiny bilateral pleural effusions with slight bibasilar atelectasis. 4. 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  Electronically Signed   By: JLorriane ShireM.D.   On: 10/04/2019 14:13   [place]ABDOMEN LIMITED RUQ  Result Date: 10/04/2019 CLINICAL DATA:  Pain and nausea EXAM: ULTRASOUND ABDOMEN LIMITED RIGHT UPPER QUADRANT COMPARISON:  CT 10/04/2019 FINDINGS: Gallbladder: No gallstones or wall thickening visualized. No sonographic Murphy sign noted by sonographer. Common bile duct: Diameter: 3.2 mm Liver: No focal lesion identified. Within normal limits in parenchymal echogenicity. Portal vein is patent on color Doppler imaging with normal direction of blood flow towards the liver. Other: None. IMPRESSION: Ultrasound appearance of the gallbladder is within normal limits. Electronically Signed   By: KDonavan FoilM.D.   On: 10/04/2019 15:16    EKG: Independently reviewed. Interpretation : Sinus tachycardia.  No acute ischemic ST-T wave changes  Assessment/Plan Principal Problem:  74year old female with a history of seizure disorder and intellectually challenged, presenting with a 2 to 3-day history of intractable nausea and  vomiting.  Work-up consistent with UTI.  Noted to have persistent sinus tachycardia    Intractable nausea and vomiting -Patient presents with a 3-day history of nausea and vomiting with work-up consistent for UTI.  Lipase was slightly elevated.  CT abdomen showed mild pericholecystic fluid but right upper quadrant sonogram was normal -Suspected etiology of nausea and vomiting is UTI, monitor for acute gastroenteritis as patient had a single episode of diarrhea -Continue to monitor suggest another etiology -Continue IV hydration(of note chest x-ray showed mild vascular congestion so recommended hydration for 1 L for now    UTI (urinary tract infection) -CT abdomen and pelvis showed cystitis and urinalysis showed large leukocyte esterase -Continue Rocephin -Follow urine culture  Sinus tachycardia -Heart rate remaining in the 120s -Suspect secondary to acute medical condition  above -Continue IV hydration.  Additional fluid bolus administered after admission -Troponin negative at 6.  TSH within normal limits    Elevated lipase -Lipase slightly elevated at 55, believed reactive from nausea and vomiting -Continue to monitor    Seizure disorder [facility]) -Patient missed a dose of his seizure medicines due to vomiting -IV Keppra.  Pharmacy consult for IV substitution to phenobarbital and phenytoin    Intellectual disability -Increase nursing assistance will be needed.    DVT prophylaxis: Lovenox Code Status: DNR as discussed with Sister Mariah Jimenez Family Communication: Sister Disposition Plan: Back to previous home environment Consults called: none  Status:obs    Athena Masse MD [facility]     10/04/2019, 8:27 PM

## 2019-10-05 DIAGNOSIS — Z6841 Body Mass Index (BMI) 40.0 and over, adult: Secondary | ICD-10-CM | POA: Diagnosis not present

## 2019-10-05 DIAGNOSIS — E871 Hypo-osmolality and hyponatremia: Secondary | ICD-10-CM | POA: Diagnosis not present

## 2019-10-05 DIAGNOSIS — Z79899 Other long term (current) drug therapy: Secondary | ICD-10-CM | POA: Diagnosis not present

## 2019-10-05 DIAGNOSIS — L89152 Pressure ulcer of sacral region, stage 2: Secondary | ICD-10-CM

## 2019-10-05 DIAGNOSIS — Z7401 Bed confinement status: Secondary | ICD-10-CM | POA: Diagnosis not present

## 2019-10-05 DIAGNOSIS — K3184 Gastroparesis: Secondary | ICD-10-CM | POA: Diagnosis not present

## 2019-10-05 DIAGNOSIS — E860 Dehydration: Secondary | ICD-10-CM | POA: Diagnosis not present

## 2019-10-05 DIAGNOSIS — F79 Unspecified intellectual disabilities: Secondary | ICD-10-CM

## 2019-10-05 DIAGNOSIS — G40909 Epilepsy, unspecified, not intractable, without status epilepticus: Secondary | ICD-10-CM

## 2019-10-05 DIAGNOSIS — R000 Tachycardia, unspecified: Secondary | ICD-10-CM | POA: Diagnosis not present

## 2019-10-05 DIAGNOSIS — N390 Urinary tract infection, site not specified: Secondary | ICD-10-CM | POA: Diagnosis not present

## 2019-10-05 DIAGNOSIS — L8990 Pressure ulcer of unspecified site, unspecified stage: Secondary | ICD-10-CM | POA: Insufficient documentation

## 2019-10-05 DIAGNOSIS — E876 Hypokalemia: Secondary | ICD-10-CM | POA: Diagnosis not present

## 2019-10-05 DIAGNOSIS — Z20822 Contact with and (suspected) exposure to covid-19: Secondary | ICD-10-CM | POA: Diagnosis not present

## 2019-10-05 DIAGNOSIS — K2950 Unspecified chronic gastritis without bleeding: Secondary | ICD-10-CM | POA: Diagnosis not present

## 2019-10-05 DIAGNOSIS — N3090 Cystitis, unspecified without hematuria: Secondary | ICD-10-CM | POA: Diagnosis not present

## 2019-10-05 DIAGNOSIS — R112 Nausea with vomiting, unspecified: Secondary | ICD-10-CM | POA: Diagnosis not present

## 2019-10-05 DIAGNOSIS — K208 Other esophagitis without bleeding: Secondary | ICD-10-CM | POA: Diagnosis not present

## 2019-10-05 DIAGNOSIS — Z66 Do not resuscitate: Secondary | ICD-10-CM | POA: Diagnosis not present

## 2019-10-05 DIAGNOSIS — K317 Polyp of stomach and duodenum: Secondary | ICD-10-CM | POA: Diagnosis not present

## 2019-10-05 LAB — TSH: TSH: 3.808 u[IU]/mL (ref 0.350–4.500)

## 2019-10-05 LAB — BASIC METABOLIC PANEL
Anion gap: 14 (ref 5–15)
BUN: 25 mg/dL — ABNORMAL HIGH (ref 8–23)
CO2: 21 mmol/L — ABNORMAL LOW (ref 22–32)
Calcium: 8.4 mg/dL — ABNORMAL LOW (ref 8.9–10.3)
Chloride: 104 mmol/L (ref 98–111)
Creatinine, Ser: 0.74 mg/dL (ref 0.44–1.00)
GFR calc Af Amer: >60 mL/min (ref >60)
GFR calc non Af Amer: >60 mL/min (ref >60)
Glucose, Bld: 103 mg/dL — ABNORMAL HIGH (ref 70–99)
Potassium: 3.4 mmol/L — ABNORMAL LOW (ref 3.5–5.1)
Sodium: 139 mmol/L (ref 135–145)

## 2019-10-05 LAB — BRAIN NATRIURETIC PEPTIDE: B Natriuretic Peptide: 35.9 pg/mL (ref 0.0–100.0)

## 2019-10-05 MED ORDER — LEVETIRACETAM 100 MG/ML PO SOLN
1000.0000 mg | Freq: Once | ORAL | Status: AC
  Administered 2019-10-05: 1000 mg via ORAL
  Filled 2019-10-05: qty 10

## 2019-10-05 MED ORDER — LEVETIRACETAM 100 MG/ML PO SOLN
1500.0000 mg | Freq: Two times a day (BID) | ORAL | Status: DC
  Administered 2019-10-05 – 2019-10-16 (×22): 1500 mg via ORAL
  Filled 2019-10-05 (×24): qty 15

## 2019-10-05 MED ORDER — MORPHINE SULFATE (PF) 2 MG/ML IV SOLN: 1.0000 mg | INTRAVENOUS | Status: DC | PRN

## 2019-10-05 MED ORDER — BOOST / RESOURCE BREEZE PO LIQD CUSTOM
1.0000 | Freq: Three times a day (TID) | ORAL | Status: DC
  Administered 2019-10-05: 12:00:00 1 via ORAL

## 2019-10-05 MED ORDER — PHENYTOIN SODIUM EXTENDED 100 MG PO CAPS
300.0000 mg | ORAL_CAPSULE | Freq: Every day | ORAL | Status: DC
  Administered 2019-10-05 – 2019-10-15 (×11): 300 mg via ORAL
  Filled 2019-10-05 (×12): qty 3

## 2019-10-05 MED ORDER — PANTOPRAZOLE SODIUM 40 MG IV SOLR
40.0000 mg | Freq: Two times a day (BID) | INTRAVENOUS | Status: DC
  Administered 2019-10-05 – 2019-10-06 (×3): 40 mg via INTRAVENOUS
  Filled 2019-10-05 (×3): qty 40

## 2019-10-05 MED ORDER — DEXTROSE IN LACTATED RINGERS 5 % IV SOLN: INTRAVENOUS | Status: DC

## 2019-10-05 MED ORDER — LEVETIRACETAM 750 MG PO TABS
1500.0000 mg | ORAL_TABLET | Freq: Two times a day (BID) | ORAL | Status: DC
  Filled 2019-10-05 (×2): qty 2

## 2019-10-05 MED ORDER — BOOST / RESOURCE BREEZE PO LIQD CUSTOM
1.0000 | Freq: Every day | ORAL | Status: DC
  Administered 2019-10-07: 12:00:00 1 via ORAL

## 2019-10-05 MED ORDER — SODIUM CHLORIDE 0.9 % IV BOLUS
500.0000 mL | Freq: Once | INTRAVENOUS | Status: AC
  Administered 2019-10-05: 500 mL via INTRAVENOUS

## 2019-10-05 MED ORDER — ADULT MULTIVITAMIN W/MINERALS CH
1.0000 | ORAL_TABLET | Freq: Every day | ORAL | Status: DC
  Administered 2019-10-05 – 2019-10-16 (×10): 1 via ORAL
  Filled 2019-10-05 (×11): qty 1

## 2019-10-05 MED ORDER — PHENOBARBITAL 32.4 MG PO TABS
48.6000 mg | ORAL_TABLET | Freq: Three times a day (TID) | ORAL | Status: DC
  Administered 2019-10-05 – 2019-10-16 (×29): 48.6 mg via ORAL
  Filled 2019-10-05 (×30): qty 2

## 2019-10-05 MED ORDER — PHENYTOIN SODIUM 50 MG/ML IJ SOLN
100.0000 mg | Freq: Three times a day (TID) | INTRAMUSCULAR | Status: DC
  Filled 2019-10-05: qty 2

## 2019-10-05 MED ORDER — ENSURE ENLIVE PO LIQD
237.0000 mL | Freq: Two times a day (BID) | ORAL | Status: DC
  Administered 2019-10-05 – 2019-10-10 (×5): 237 mL via ORAL

## 2019-10-05 MED ORDER — SODIUM CHLORIDE 0.9% FLUSH
10.0000 mL | Freq: Two times a day (BID) | INTRAVENOUS | Status: DC
  Administered 2019-10-05 – 2019-10-16 (×19): 10 mL

## 2019-10-05 MED ORDER — SODIUM CHLORIDE 0.9% FLUSH
10.0000 mL | INTRAVENOUS | Status: DC | PRN
  Administered 2019-10-12 (×2): 10 mL

## 2019-10-05 NOTE — Progress Notes (Signed)
PT Cancellation Note  Patient Details Name: Mariah Jimenez MRN: [id number] DOB: Feb 20, 1946   Cancelled Treatment:    Reason Eval/Treat Not Completed: PT screened, no needs identified, will sign off (Pt familiar to our services from prior admissions. Pt is bed bound at baseline, total care for ADL, living in facility. Pt is at her baseline for basic mobility, no acute PT needs at this time.) PT signing off at this time, thank you for this consultation.   2:09 PM, 10/05/19 Rosamaria Lints, PT, DPT Physical Therapist - [facility]  [phone] (ASCOM)   [place] [facility] 10/05/2019, 2:08 PM

## 2019-10-05 NOTE — Evaluation (Signed)
Occupational Therapy Evaluation Patient Details Name: Mariah Jimenez MRN: [id number] DOB: [birth year] Today's Date: 10/05/2019    History of Present Illness 74 year old female with past medical history for intellectual disability and seizures.  She reported 3 days of nausea and vomiting, not able to tolerate p.o.  Outpatient evaluation with abdominal x-ray showed possible small bowel obstruction she was sent to the hospital for further evaluation.   Clinical Impression    Ms Kachmar was seen for OT evaluation this date. Prior to hospital admission, pt was resident of [place] at [facility]. Pt previously was self-feeding however recently required assist to feed 2/2 poor p.o. tolerance. Pt presents to acute OT demonstrating impaired ADL performance and functional mobility 2/2 decreased UE strength/ROM and decreased [place]. Pt currently requires SETUP + VCs for pacing self-feeding at bed level (Pt seated upright 90* per SLP). Pt is bed bound at baseline - TOTAL A anticipated for LBD and bathing ADLs. Pt would benefit from skilled OT to address noted impairments and functional limitations (see below for any additional details) in order to maximize safety and independence while minimizing falls risk and caregiver burden. Upon hospital discharge, recommend HHOT to maximize pt safety and return to functional independence during meaningful occupations of daily life.   Follow Up Recommendations  Home health OT;LTACH    Equipment Recommendations       Recommendations for Other Services       Precautions / Restrictions Precautions Precautions: Fall;Other (comment) (Aspiration precautions) Restrictions Weight Bearing Restrictions: No      Mobility Bed Mobility               General bed mobility comments: Not tested  Transfers                 General transfer comment: Not tested - bed bound baseline    Balance                                           ADL  either performed or assessed with clinical judgement   ADL Overall ADL's : Needs assistance/impaired                                       General ADL Comments: Pt bed bound at baseline - TOTAL A anticipated for LBD and bathing ADLs. SETUP + VCs for pacing self-feeding at bed level (Pt seated upright 90* per SLP)     Vision         Perception     Praxis      Pertinent Vitals/Pain Pain Assessment: Faces Faces Pain Scale: Hurts a little bit Pain Intervention(s): Repositioned     Hand Dominance Right   Extremity/Trunk Assessment Upper Extremity Assessment Upper Extremity Assessment: RUE deficits/detail;LUE deficits/detail RUE Deficits / Details: Grip and elbow flexion 3/5.  LUE Deficits / Details: Grip and elbow flexion 3/5. Shoulder flexion 3-/5   Lower Extremity Assessment Lower Extremity Assessment:  (Not tested, bed bound at baseline )       Communication Communication Communication:  (Pt moans in response to questions)   Cognition Arousal/Alertness: Awake/alert Behavior During Therapy: WFL for tasks assessed/performed Overall Cognitive Status: No family/caregiver present to determine baseline cognitive functioning  General Comments: Sleeping upon arrival and RN reports lethargic t/o day - pt awakes to light and HoB raised to sitting - awkae t/o feeding. Follows commands c increased time.    General Comments       Exercises Exercises: Other exercises Other Exercises Other Exercises: Pt educated re: OT role, aspiration pcns Other Exercises: Face washing, self-feeding   Shoulder Instructions      Home Living Family/patient expects to be discharged to:: Other (Comment) ([facility] - [facility])                                        Prior Functioning/Environment Level of Independence: Needs assistance  Gait / Transfers Assistance Needed: bed bound at baseline last year at  [facility] ADL's / Homemaking Assistance Needed: Self-feeder at last admission 08/20/2019, per RN pt family reports pt now requires assist for feeding             OT Problem List: Decreased strength;Decreased range of motion;Decreased coordination;Impaired UE functional use      OT Treatment/Interventions: Self-care/ADL training;Therapeutic exercise;Therapeutic activities    OT Goals(Current goals can be found in the care plan section) Acute Rehab OT Goals Patient Stated Goal: Unable to state  OT Goal Formulation: Patient unable to participate in goal setting Time For Goal Achievement: 10/19/19 Potential to Achieve Goals: Fair ADL Goals Pt Will Perform Eating: with set-up;with supervision;bed level (c multimodal cues for pacing) Pt Will Perform Grooming: bed level;with supervision (c VCs for thoroughness) Pt Will Perform Upper Body Bathing: with mod assist;bed level  OT Frequency: Min 1X/week   Barriers to D/C:            Co-evaluation              AM-PAC OT "6 Clicks" Daily Activity     Outcome Measure Help from another person eating meals?: A Little Help from another person taking care of personal grooming?: A Lot Help from another person toileting, which includes using toliet, bedpan, or urinal?: Total Help from another person bathing (including washing, rinsing, drying)?: Total Help from another person to put on and taking off regular upper body clothing?: A Lot Help from another person to put on and taking off regular lower body clothing?: Total 6 Click Score: 10   End of Session Nurse Communication: Other (comment) (self-feeding status - requires assist for pacing and setup)  Activity Tolerance: Patient tolerated treatment well Patient left: in bed;with bed alarm set;Other (comment) (Pt seated upright 90* post-feeding )  OT Visit Diagnosis: Adult, failure to thrive (R62.7);Feeding difficulties (R63.3)                Time: 1425-1445 OT Time Calculation (min): 20  min Charges:  OT General Charges $OT Visit: 1 Visit OT Evaluation $OT Eval Low Complexity: 1 Low OT Treatments $Self Care/Home Management : 8-22 mins  Kathie Dike, M.S. OTR/L  10/05/19, 3:54 PM

## 2019-10-05 NOTE — Progress Notes (Signed)
Initial Nutrition Assessment  DOCUMENTATION CODES:   Morbid obesity  INTERVENTION:  Boost Breeze po daily, each supplement provides 250 kcal and 9 grams of protein Ensure Enlive po BID, each supplement provides 350 kcal and 20 grams of protein (vanilla) MVI with minerals daily   NUTRITION DIAGNOSIS:   Inadequate oral intake related to nausea, vomiting as evidenced by per patient/family report.    GOAL:   Patient will meet greater than or equal to 90% of their needs    MONITOR:   PO intake, Diet advancement, Labs, Weight trends, Supplement acceptance  REASON FOR ASSESSMENT:   Malnutrition Screening Tool    ASSESSMENT:  74 year old female admitted for intractable nausea and vomiting and UTI with past medical history of intellectual disability and seizure disorder presented from nursing facility with 3 day history of nausea, vomiting, and single episode of diarrhea.  Patient awake and alert this morning, mostly nonverbal at baseline. Sister at bedside provides history. She reports that patient has been bedbound for the past year after femur fracture s/p IM nailing, unable to regain mobility after PT attempts. Sister reports that patient is hungry today, and has been asking for food. She reports that patient has a good appetite and oral intake at facility, able to feed self at baseline. Over the past week, pt has not had much to eat or drink due to new onset chewing/swallowing difficulties and complaining of pain with swallowing associated with nausea/vomiting. Patient evaluated by SLP, recommending consideration of puree-minced pending GI evaluation. Sister reports that patient likes vanilla flavored supplements, diet advanced to soft, will decrease Boost Breeze to daily, order vanilla Ensure BID to aid with meeting needs.   Generalized mild-pitting edema per RN assessment Per chart, weights have been stable 297-305 lbs over the past year.  Medications reviewed and include:  Keppra, Protonix, Luminal, Dilantin IVF: D5 in lactated ringers @50  ml/hr IVPB: Rocephin Labs: K 3.4 (L)  NUTRITION - FOCUSED PHYSICAL EXAM: No fat or muscle depletions identified   Diet Order:   Diet Order            DIET SOFT Room service appropriate? Yes; Fluid consistency: Thin  Diet effective now                 EDUCATION NEEDS:   No education needs have been identified at this time  Skin:  Skin Assessment: Skin Integrity Issues: Skin Integrity Issues:: Stage II Stage II: sacrum  Last BM:  6/24  Height:   Ht Readings from Last 1 Encounters:  10/04/19 5\' 8"  (1.727 m)    Weight:   Wt Readings from Last 1 Encounters:  10/04/19 135 kg    BMI:  Body mass index is 45.25 kg/m.  Estimated Nutritional Needs:   Kcal:  2100-2300  Protein:  105-115  Fluid:  >/= 2.1 L/day   , RD, LDN Clinical Nutrition After Hours/Weekend Pager # in Amion

## 2019-10-05 NOTE — Plan of Care (Signed)
  Problem: Clinical Measurements: Goal: Ability to maintain clinical measurements within normal limits will improve Outcome: Progressing Goal: Will remain free from infection Outcome: Progressing Goal: Cardiovascular complication will be avoided Outcome: Progressing   Problem: Coping: Goal: Level of anxiety will decrease Outcome: Progressing   Problem: Safety: Goal: Ability to remain free from injury will improve Outcome: Progressing

## 2019-10-05 NOTE — Progress Notes (Addendum)
PROGRESS NOTE    Perl Folmar  STM:[id number] DOB: 1946-02-11 DOA: 10/04/2019 PCP: Selena Lesser, [facility]    Brief Narrative:  Patient admitted to the hospital with a working diagnosis of intractable nausea and vomiting in the setting of urinary tract infection.  74 year old female with past medical history for intellectual disability and seizures.  She reported 3 days of nausea and vomiting, not able to tolerate p.o.  Outpatient evaluation with abdominal x-ray showed possible small bowel obstruction she was sent to the hospital for further evaluation.  On her initial physical examination her heart rate was 100 to 120, blood pressure 130/86, respiratory rate 16, oxygen saturation 97%.  Patient was mildly lethargic, very restless, her lungs were clear to auscultation bilaterally, heart S1-S2, present rhythmic, her abdomen was soft, no lower extremity edema. Sodium 133, potassium 3.4, chloride 97, bicarb 23, glucose 99, BUN 31, creatinine 0.79, anion gap 13, lipase 55, AST 24, ALT 21, white count 9.2, hemoglobin 11.4, hematocrit 35.1, platelets 365.  SARS COVID-19 negative.  Urinalysis specific gravity greater than 1.046, 21-50 red cells, 11-20 white cells. CT of the abdomen with findings consistent with cystitis, small amount of pericholecystic fluid and slightly pericholecystic haziness, no gallbladder thickening or stones, questionable early cholecystitis. Chest radiograph, hypopenetration, possible bilateral small pleural effusions, hilar vascular congestion. Right upper quadrant ultrasound with normal gallbladder. EKG 123 bpm, normal axis, normal intervals, sinus rhythm, Q wave V1 and V2, no ST segment or T wave changes, positive baseline artifact.   Assessment & Plan:   Principal Problem:   Intractable nausea and vomiting Active Problems:   Seizure disorder (HCC)   UTI (urinary tract infection)   Intellectual disability   Elevated lipase   Sinus tachycardia    Pressure injury of skin   1. Urinary tract infection, complicated with intractable nausea and vomiting. Patient continue with poor oral intake, not able to feed herself, not yet back to her baseline.   Will continue hydration with dextrose and balanced electrolyte solutions at 75 ml per H. Patient had one dose of fosfomycin on admission, but clinically patient with complicated infection, likely more than simple cystitis, considering her comorbid conditions and risk of multidrug resistant bacteria will continue antibiotic therapy with IV ceftriaxone, follow up on cultures, temperature curve and cell count.   Will continue antiacid therapy with IV pantoprazole, supportive IV fluids and as needed antiemetics and analgesics (morphine). Advance diet to soft as tolerated.   2. Seizure disorder. No signs of active seizure activity. Will continue neuro checks per unit protocol.   Resume home dose of Keppra 1500 mg bid, continue with phenobarbital and phenytoin.   3. Cognitive impairment. Patient has been non ambulatory for at least 2 years, positive speech impairment.   Her sister is at bedside and confirms patient is not at her baseline.   4. Stage 2 sacrum ulcer. Present on admission. Continue with local wound care.   PT/ OT/ Speech evaluation.   5. Obesity class 3. Her calculated BMI is 45, follow as outpatient. Challenging treatment due to cognitive impairment.    Patient continue to be at high risk for worsening urine infection.   Status is: Observation  The patient will require care spanning > 2 midnights and should be moved to inpatient because: IV treatments appropriate due to intensity of illness or inability to take PO  Dispo: The patient is from: SNF              Anticipated d/c is to: SNF  Anticipated d/c date is: 2 days              Patient currently is not medically stable to d/c.    DVT prophylaxis: Enoxaparin   Code Status:   dnr  Family Communication:   I  spoke with patient's sister at the bedside, we talked in detail about patient's condition, plan of care and prognosis and all questions were addressed.     Nutrition Status:           Skin Documentation: Pressure Injury 10/04/19 Sacrum Right Stage 2 -  Partial thickness loss of dermis presenting as a shallow open injury with a red, pink wound bed without slough. (Active)  10/04/19 2200  Location: Sacrum  Location Orientation: Right  Staging: Stage 2 -  Partial thickness loss of dermis presenting as a shallow open injury with a red, pink wound bed without slough.  Wound Description (Comments):   Present on Admission: Yes       Antimicrobials:   ceftriaxone     Subjective: Patient not completely verbal, continue to have abdominal pain epigastric, no chest pain or dyspnea. On clear liquid diet.   Objective: Vitals:   10/04/19 2352 10/05/19 0500 10/05/19 0654 10/05/19 0756  BP: (!) 98/55 115/63  109/61  Pulse: (!) 125 (!) 113 (!) 111 (!) 110  Resp: 19   19  Temp: 98.3 F (36.8 C) 98.5 F (36.9 C)  98.2 F (36.8 C)  TempSrc: Oral Oral    SpO2: 96%   95%  Weight:      Height:        Intake/Output Summary (Last 24 hours) at 10/05/2019 1047 Last data filed at 10/05/2019 0076 Gross per 24 hour  Intake 485.93 ml  Output 250 ml  Net 235.93 ml   Filed Weights   10/04/19 1239  Weight: 135 kg    Examination:   General: Not in pain or dyspnea, deconditioned and ill looking appearing Neurology: Awake and alert, non focal, positive cognitive impairment, difficulty communicating.   E ENT: mild pallor, no icterus, oral mucosa moist Cardiovascular: No JVD. S1-S2 present, rhythmic, no gallops, rubs, or murmurs. +++ non pitting lower extremity edema. Pulmonary: positive breath sounds bilaterally, adequate air movement, no wheezing, rhonchi or rales. Gastrointestinal. Abdomen protuberant, with no organomegaly, non tender, no rebound or guarding Skin. No  rashes Musculoskeletal: no joint deformities     Data Reviewed: I have personally reviewed following labs and imaging studies  CBC: Recent Labs  Lab 10/04/19 1244  WBC 9.2  HGB 11.4*  HCT 35.1*  MCV 84.6  PLT 365   Basic Metabolic Panel: Recent Labs  Lab 10/04/19 1244 10/05/19 0425  NA 133* 139  K 3.4* 3.4*  CL 97* 104  CO2 23 21*  GLUCOSE 99 103*  BUN 31* 25*  CREATININE 0.79 0.74  CALCIUM 8.7* 8.4*   GFR: Estimated Creatinine Clearance: 92.6 mL/min (by C-G formula based on SCr of 0.74 mg/dL). Liver Function Tests: Recent Labs  Lab 10/04/19 1244  AST 24  ALT 21  ALKPHOS 203*  BILITOT 1.2  PROT 8.0  ALBUMIN 2.6*   Recent Labs  Lab 10/04/19 1244  LIPASE 55*   No results for input(s): AMMONIA in the last 168 hours. Coagulation Profile: No results for input(s): INR, PROTIME in the last 168 hours. Cardiac Enzymes: No results for input(s): CKTOTAL, CKMB, CKMBINDEX, TROPONINI in the last 168 hours. BNP (last 3 results) No results for input(s): PROBNP in the last 8760 hours. HbA1C:  No results for input(s): HGBA1C in the last 72 hours. CBG: No results for input(s): GLUCAP in the last 168 hours. Lipid Profile: No results for input(s): CHOL, HDL, LDLCALC, TRIG, CHOLHDL, LDLDIRECT in the last 72 hours. Thyroid Function Tests: Recent Labs    10/04/19 1244  TSH 3.808   Anemia Panel: No results for input(s): VITAMINB12, FOLATE, FERRITIN, TIBC, IRON, RETICCTPCT in the last 72 hours.    Radiology Studies: I have reviewed all of the imaging during this hospital visit personally     Scheduled Meds: . enoxaparin (LOVENOX) injection  40 mg Subcutaneous Q24H  . loratadine  10 mg Oral Daily  . PHENObarbital  65 mg Intravenous BID  . phenytoin (DILANTIN) IV  100 mg Intravenous Q8H  . sodium chloride flush  10-40 mL Intracatheter Q12H   Continuous Infusions: . cefTRIAXone (ROCEPHIN)  IV    . levETIRAcetam       LOS: 0 days        Calandra Madura  Annett Gula, MD

## 2019-10-05 NOTE — Evaluation (Signed)
Clinical/Bedside Swallow Evaluation Patient Details  Name: Mariah Jimenez MRN: [id number] Date of Birth: 06-Jan-1946  Today's Date: 10/05/2019 Time: SLP Start Time (ACUTE ONLY): 0950 SLP Stop Time (ACUTE ONLY): 1050 SLP Time Calculation (min) (ACUTE ONLY): 60 min  Past Medical History:  Past Medical History:  Diagnosis Date  . Mental retardation   . Seizures (HCC)    Past Surgical History:  Past Surgical History:  Procedure Laterality Date  . NO PAST SURGERIES    . TIBIA IM NAIL INSERTION Right 04/09/2016   Procedure: INTRAMEDULLARY (IM) NAIL TIBIAL;  Surgeon: Christena Flake, MD;  Location: [facility] ORS;  Service: Orthopedics;  Laterality: Right;   HPI:  Pt is a 74 y.o. female with medical history significant for intellectual disability(reduced language capabilities per family); obesity, and seizure disorder who presents to the emergency room from NH with a 3-day history of nausea and vomiting with a single episode of diarrhea.  She was unable to take her seizure medicines this morning due to the vomiting.  She has associated mild abdominal discomfort which she is unable to describe further.  History is limited due to patient's intellectual disability and is taken from ER and nursing home records.  She had an outpatient abdominal x-ray that was concerning for small bowel obstruction so she was sent into the emergency room for evaluation.  She was recently admitted w/ dx of seizure in 08/2019.  Patient had a CT abdomen and pelvis that was concerning for cystitis as well as possible early cholecystitis given tiny amount of pericholecystic fluid and slight pericholecystic haziness.  She subsequently underwent a right upper quadrant sonogram that showed no acute findings.  Patient was hydrated and started on IV Rocephin for UTI.  Per sister, pt's current mentation is at baseline(few phonations as response but not discernable; does not consistently follow commands) w/ baseline facial differences of a large,  forward tongue and large bottom lip protruding. No acute intracranial pathology was evident in head CT last admit.  CXR: Cardiomegaly with central vascular congestion. Probable patchy atelectasis in L base.   Assessment / Plan / Recommendation Clinical Impression  BSE completed this morning. Family present during and after for education also. Pt is mostly nonverbal at baseline d/t mental retardation. She is Edentulous at baseline. Family reported she is on a mech soft diet(since last admit recs.) at the NH. Pt has been exhibiting s/s of Gag and Regurgitation; N/V and abdominal discomfort requiring current admit to ths hospital. For this evaluation, pt required Full assistance and verbal cues in positioning upright in bed; she gave minimal assistance to support self more forward/upright d/t generalized weakness. Noted congested breathing at rest, but this lessened/resolved once she was fully awake and sitting upright(from a flat position). Oral care completed; min excessive saliva noted w/ drooling and macrolabia -- family stated this is Baseline for pt. Lingual position slightly more forward but no unilateral asymmetry noted, no lingual weakness noted. Pt fed trials of thin liquids, ice chips, and purees w/ No pharyngeal phase dysphagia noted; no overt clinical s/s of aspiration, no decline in respiratory presentation during/after po's. Pharyngeal swallows appeared complete. Noted min+ impulsive sips requiring pinched straw to limit bolus size/frequency. Oral phase c/b min slower oral A-P transfer for swallow and/or oral holding(~11-15 secs at times), but this can be related to GI discomfort(unconsciously done). Given Time, she completed A-P transfer, swallow, and oral clearing. Of note, w/ few po trials given each time, hiccups and gagging behavior noted -- Time allowed it  to calm, but this presentation occurred again when trials resumed so frequent Rest Breaks given. Full feeding support given. Recommend  continue current diet including Thin liquids (as per MD d/t GI) w/ consideration of Puree-Minced only vs swallowing Whole foods gummed/mashed(poorly masticated) only esp. in light of Cognitive status when ready to upgrade to foods in her diet. Recommend general aspiration precautions; Pills Crushed in puree; Feeding support and Supervision at meals. MD and NSG updated.  SLP Visit Diagnosis: Dysphagia, oral phase (R13.11) (slow A-P transfer; Edentulous. Cognitive decline baseline)    Aspiration Risk  Mild aspiration risk;Risk for inadequate nutrition/hydration (reduced following precautions)    Diet Recommendation  Clear liquid diet (thin consistency) as per MD d/t GI; general aspiration precautions; feeding support/Supervision at meals d/t Cognitive status Baseline  Medication Administration: Crushed with puree (for safer swallowing)    Other  Recommendations Recommended Consults: Consider GI evaluation (following; Dietician f/u) Oral Care Recommendations: Oral care BID;Oral care before and after PO;Staff/trained caregiver to provide oral care Other Recommendations:  (n/a)   Follow up Recommendations None (TBD)      Frequency and Duration min 2x/week  1 week       Prognosis Prognosis for Safe Diet Advancement: Fair Barriers to Reach Goals: Cognitive deficits;Severity of deficits;Behavior;Language deficits      Swallow Study   General Date of Onset: 10/04/19 HPI: Pt is a 74 y.o. female with medical history significant for intellectual disability(reduced language capabilities per family); obesity, and seizure disorder who presents to the emergency room from NH with a 3-day history of nausea and vomiting with a single episode of diarrhea.  She was unable to take her seizure medicines this morning due to the vomiting.  She has associated mild abdominal discomfort which she is unable to describe further.  History is limited due to patient's intellectual disability and is taken from ER and  nursing home records.  She had an outpatient abdominal x-ray that was concerning for small bowel obstruction so she was sent into the emergency room for evaluation.  She was recently admitted w/ dx of seizure in 08/2019.  Patient had a CT abdomen and pelvis that was concerning for cystitis as well as possible early cholecystitis given tiny amount of pericholecystic fluid and slight pericholecystic haziness.  She subsequently underwent a right upper quadrant sonogram that showed no acute findings.  Patient was hydrated and started on IV Rocephin for UTI.  Per sister, pt's current mentation is at baseline(few phonations as response but not discernable; does not consistently follow commands) w/ baseline facial differences of a large, forward tongue and large bottom lip protruding. No acute intracranial pathology was evident in head CT last admit.  CXR: Cardiomegaly with central vascular congestion. Probable patchy atelectasis in L base. Type of Study: Bedside Swallow Evaluation Previous Swallow Assessment: 08/20/2019 Diet Prior to this Study: Thin liquids (clear liquid diet) Temperature Spikes Noted: No (wbc 9.2) Respiratory Status: Room air History of Recent Intubation: No Behavior/Cognition: Alert;Cooperative;Pleasant mood;Distractible;Requires cueing (Baseline MR) Oral Cavity Assessment: Excessive secretions (min drooling) Oral Care Completed by SLP: Yes Oral Cavity - Dentition: Edentulous (baseline) Vision:  (n/a) Self-Feeding Abilities: Total assist Patient Positioning: Upright in bed (needed full positioning) Baseline Vocal Quality: Normal (phonations) Volitional Cough: Cognitively unable to elicit Volitional Swallow: Unable to elicit    Oral/Motor/Sensory Function Overall Oral Motor/Sensory Function: Generalized oral weakness (anterior tongue/lip positioning at rest, drooling at times) Facial Symmetry: Within Functional Limits Lingual Symmetry: Within Functional Limits   Ice Chips Ice chips:  Within functional limits (grossly) Presentation: Spoon (fed; 3 trials)   Thin Liquid Thin Liquid: Impaired (min) Presentation: Straw (fed; ~5-6 ozs total) Oral Phase Impairments:  (large sips) Oral Phase Functional Implications: Prolonged oral transit (intermittently) Pharyngeal  Phase Impairments:  (none) Other Comments: impulsive sips requiring pinched straw    Nectar Thick Nectar Thick Liquid: Not tested   Honey Thick Honey Thick Liquid: Not tested   Puree Puree: Impaired Presentation: Spoon (fed; 9 trials total) Oral Phase Impairments:  (adequate) Oral Phase Functional Implications: Prolonged oral transit (min) Pharyngeal Phase Impairments:  (none)   Solid     Solid: Not tested Other Comments: d/t GI status       Orinda Kenner, MS, CCC-SLP Arion Morgan 10/05/2019,11:34 AM

## 2019-10-06 DIAGNOSIS — L8942 Pressure ulcer of contiguous site of back, buttock and hip, stage 2: Secondary | ICD-10-CM

## 2019-10-06 DIAGNOSIS — R112 Nausea with vomiting, unspecified: Secondary | ICD-10-CM | POA: Diagnosis not present

## 2019-10-06 DIAGNOSIS — E860 Dehydration: Secondary | ICD-10-CM | POA: Diagnosis not present

## 2019-10-06 DIAGNOSIS — F79 Unspecified intellectual disabilities: Secondary | ICD-10-CM | POA: Diagnosis not present

## 2019-10-06 DIAGNOSIS — G40909 Epilepsy, unspecified, not intractable, without status epilepticus: Secondary | ICD-10-CM | POA: Diagnosis not present

## 2019-10-06 DIAGNOSIS — L89152 Pressure ulcer of sacral region, stage 2: Secondary | ICD-10-CM | POA: Diagnosis not present

## 2019-10-06 LAB — URINE CULTURE: Culture: >=100000 — AB

## 2019-10-06 MED ORDER — PANTOPRAZOLE SODIUM 40 MG PO PACK
40.0000 mg | PACK | Freq: Two times a day (BID) | ORAL | Status: DC
  Administered 2019-10-07 – 2019-10-14 (×13): 40 mg
  Filled 2019-10-06 (×18): qty 20

## 2019-10-06 MED ORDER — PANTOPRAZOLE SODIUM 40 MG PO PACK
40.0000 mg | PACK | Freq: Two times a day (BID) | ORAL | Status: DC
  Filled 2019-10-06 (×2): qty 20

## 2019-10-06 MED ORDER — POTASSIUM CHLORIDE 20 MEQ PO PACK
40.0000 meq | PACK | Freq: Two times a day (BID) | ORAL | Status: AC
  Administered 2019-10-06 (×2): 40 meq via ORAL
  Filled 2019-10-06 (×2): qty 2

## 2019-10-06 NOTE — Progress Notes (Signed)
PROGRESS NOTE    Mariah Jimenez  ZOX:[id number] DOB: July 19, 1945 DOA: 10/04/2019 PCP: Selena Lesser, [facility]    Brief Narrative:  Patient admitted to the hospital with a working diagnosis of intractable nausea and vomiting in the setting of urinary tract infection.  74 year old female with past medical history for intellectual disability and seizures.  She reported 3 days of nausea and vomiting, not able to tolerate p.o.  Outpatient evaluation with abdominal x-ray showed possible small bowel obstruction she was sent to the hospital for further evaluation.  On her initial physical examination her heart rate was 100 to 120, blood pressure 130/86, respiratory rate 16, oxygen saturation 97%.  Patient was mildly lethargic, very restless, her lungs were clear to auscultation bilaterally, heart S1-S2, present rhythmic, her abdomen was soft, no lower extremity edema. Sodium 133, potassium 3.4, chloride 97, bicarb 23, glucose 99, BUN 31, creatinine 0.79, anion gap 13, lipase 55, AST 24, ALT 21, white count 9.2, hemoglobin 11.4, hematocrit 35.1, platelets 365.  SARS COVID-19 negative.  Urinalysis specific gravity greater than 1.046, 21-50 red cells, 11-20 white cells. CT of the abdomen with findings consistent with cystitis, small amount of pericholecystic fluid and slightly pericholecystic haziness, no gallbladder thickening or stones, questionable early cholecystitis. Chest radiograph, hypopenetration, possible bilateral small pleural effusions, hilar vascular congestion. Right upper quadrant ultrasound with normal gallbladder. EKG 123 bpm, normal axis, normal intervals, sinus rhythm, Q wave V1 and V2, no ST segment or T wave changes, positive baseline artifact.   Patient had one dose of fosfomycin on admission, but clinically patient with complicated infection, likely more than simple cystitis, considering her comorbid conditions and risk of multidrug resistant bacteria will continue  antibiotic therapy with IV ceftriaxone, follow up on cultures, temperature curve and cell count.  Assessment & Plan:   Principal Problem:   Intractable nausea and vomiting Active Problems:   Seizure disorder (HCC)   UTI (urinary tract infection)   Intellectual disability   Elevated lipase   Sinus tachycardia   Pressure injury of skin   Dehydration    1. Urinary tract infection, complicated with intractable nausea and vomiting. Today more awake and alert, but not yet back to baseline.  No further nausea or vomiting.   Continue IV fluids with dextrose and balanced electrolyte solutions reduce rate to 50 ml per H.    Continue antibiotic therapy with IV ceftriaxone, follow up on blood cultures, cell count and temperature curve.  Change IV to po pantoprazole, and continue with as needed antiemetics. Will dc morphine for now. Advance diet to dysphagia one diet.   2. Seizure disorder. No active seizures, her mentation has improved.  Continue with Keppra 1500 mg bid,  phenobarbital and phenytoin.   3. Cognitive impairment. Patient has been non ambulatory for at least 2 years, positive speech impairment.   Continue neuro checks per unit protocol, per her sister at baseline she is able to feed herself.   4. Stage 2 sacrum ulcer. Present on admission. On local wound care.    5. Obesity class 3.  BMI is 45. Challenging treatment due to cognitive impairment. Continue with nutrition supplements.   6. Hypokalemia. Renal function with serum K down to 3,6 with serum cr at 0,74 and serum bicarbonate at 21.   Will add 40 meq Kcl x2 and check Mg in am.   Patient no ambulatory continue with conservative care.    Status is: Inpatient  Remains inpatient appropriate because:IV treatments appropriate due to intensity of illness  or inability to take PO   Dispo: The patient is from: SNF              Anticipated d/c is to: SNF              Anticipated d/c date is: 1 day               Patient currently is not medically stable to d/c.   DVT prophylaxis: Enoxaparin   Code Status:    dnr   Family Communication:  No family at the bedside     Nutrition Status: Nutrition Problem: Inadequate oral intake Etiology: nausea, vomiting Signs/Symptoms: per patient/family report Interventions: Boost Breeze, Magic cup, Ensure Enlive (each supplement provides 350kcal and 20 grams of protein)     Skin Documentation: Pressure Injury 10/04/19 Sacrum Right Stage 2 -  Partial thickness loss of dermis presenting as a shallow open injury with a red, pink wound bed without slough. (Active)  10/04/19 2200  Location: Sacrum  Location Orientation: Right  Staging: Stage 2 -  Partial thickness loss of dermis presenting as a shallow open injury with a red, pink wound bed without slough.  Wound Description (Comments):   Present on Admission: Yes     Antimicrobials:   ciprofloxacin   Subjective: Patient is more awake and alert, not communicative, so far with no nausea or baseline. No family at the bedside. Patient improved but not yet back to baseline.    Objective: Vitals:   10/05/19 2156 10/05/19 2158 10/06/19 0129 10/06/19 0747  BP:  112/67 136/83 130/82  Pulse:  (!) 105 (!) 110 95  Resp: 18 19 14 19   Temp:   98.9 F (37.2 C) 98.9 F (37.2 C)  TempSrc:    Oral  SpO2:   95% 96%  Weight:      Height:        Intake/Output Summary (Last 24 hours) at 10/06/2019 1047 Last data filed at 10/06/2019 0500 Gross per 24 hour  Intake 917.51 ml  Output 400 ml  Net 517.51 ml   Filed Weights   10/04/19 1239  Weight: 135 kg    Examination:   General: Not in pain or dyspnea, deconditioned  Neurology: Awake, E ENT: mild pallor, no icterus, oral mucosa moist Cardiovascular: No JVD. S1-S2 present, rhythmic, no gallops, rubs, or murmurs. Non pitting lower extremity edema. Pulmonary: positive breath sounds bilaterally, adequate air movement, no wheezing, rhonchi or  rales. Gastrointestinal. Abdomen protuberant with no organomegaly, non tender, no rebound or guarding Skin. No rashes Musculoskeletal: no joint deformities     Data Reviewed: I have personally reviewed following labs and imaging studies  CBC: Recent Labs  Lab 10/04/19 1244  WBC 9.2  HGB 11.4*  HCT 35.1*  MCV 84.6  PLT 365   Basic Metabolic Panel: Recent Labs  Lab 10/04/19 1244 10/05/19 0425  NA 133* 139  K 3.4* 3.4*  CL 97* 104  CO2 23 21*  GLUCOSE 99 103*  BUN 31* 25*  CREATININE 0.79 0.74  CALCIUM 8.7* 8.4*   GFR: Estimated Creatinine Clearance: 92.6 mL/min (by C-G formula based on SCr of 0.74 mg/dL). Liver Function Tests: Recent Labs  Lab 10/04/19 1244  AST 24  ALT 21  ALKPHOS 203*  BILITOT 1.2  PROT 8.0  ALBUMIN 2.6*   Recent Labs  Lab 10/04/19 1244  LIPASE 55*   No results for input(s): AMMONIA in the last 168 hours. Coagulation Profile: No results for input(s): INR, PROTIME in the last 168  hours. Cardiac Enzymes: No results for input(s): CKTOTAL, CKMB, CKMBINDEX, TROPONINI in the last 168 hours. BNP (last 3 results) No results for input(s): PROBNP in the last 8760 hours. HbA1C: No results for input(s): HGBA1C in the last 72 hours. CBG: No results for input(s): GLUCAP in the last 168 hours. Lipid Profile: No results for input(s): CHOL, HDL, LDLCALC, TRIG, CHOLHDL, LDLDIRECT in the last 72 hours. Thyroid Function Tests: Recent Labs    10/04/19 1244  TSH 3.808   Anemia Panel: No results for input(s): VITAMINB12, FOLATE, FERRITIN, TIBC, IRON, RETICCTPCT in the last 72 hours.    Radiology Studies: I have reviewed all of the imaging during this hospital visit personally     Scheduled Meds: . enoxaparin (LOVENOX) injection  40 mg Subcutaneous Q24H  . feeding supplement  1 Container Oral Q breakfast  . feeding supplement (ENSURE ENLIVE)  237 mL Oral BID BM  . levETIRAcetam  1,500 mg Oral BID  . loratadine  10 mg Oral Daily  .  multivitamin with minerals  1 tablet Oral Daily  . pantoprazole (PROTONIX) IV  40 mg Intravenous Q12H  . phenobarbital  48.6 mg Oral TID  . phenytoin  300 mg Oral QHS  . sodium chloride flush  10-40 mL Intracatheter Q12H   Continuous Infusions: . cefTRIAXone (ROCEPHIN)  IV Stopped (10/05/19 2024)  . dextrose 5% lactated ringers 50 mL/hr at 10/06/19 0500     LOS: 1 day        Maicol Bowland Annett Gula, MD

## 2019-10-07 DIAGNOSIS — E669 Obesity, unspecified: Secondary | ICD-10-CM

## 2019-10-07 DIAGNOSIS — R112 Nausea with vomiting, unspecified: Secondary | ICD-10-CM | POA: Diagnosis not present

## 2019-10-07 DIAGNOSIS — F79 Unspecified intellectual disabilities: Secondary | ICD-10-CM | POA: Diagnosis not present

## 2019-10-07 DIAGNOSIS — G40909 Epilepsy, unspecified, not intractable, without status epilepticus: Secondary | ICD-10-CM | POA: Diagnosis not present

## 2019-10-07 DIAGNOSIS — N3000 Acute cystitis without hematuria: Secondary | ICD-10-CM | POA: Diagnosis not present

## 2019-10-07 DIAGNOSIS — E860 Dehydration: Secondary | ICD-10-CM | POA: Diagnosis not present

## 2019-10-07 DIAGNOSIS — L8942 Pressure ulcer of contiguous site of back, buttock and hip, stage 2: Secondary | ICD-10-CM | POA: Diagnosis not present

## 2019-10-07 LAB — BASIC METABOLIC PANEL
Anion gap: 10 (ref 5–15)
BUN: 16 mg/dL (ref 8–23)
CO2: 23 mmol/L (ref 22–32)
Calcium: 8.4 mg/dL — ABNORMAL LOW (ref 8.9–10.3)
Chloride: 108 mmol/L (ref 98–111)
Creatinine, Ser: 0.61 mg/dL (ref 0.44–1.00)
GFR calc Af Amer: >60 mL/min (ref >60)
GFR calc non Af Amer: >60 mL/min (ref >60)
Glucose, Bld: 104 mg/dL — ABNORMAL HIGH (ref 70–99)
Potassium: 3.6 mmol/L (ref 3.5–5.1)
Sodium: 141 mmol/L (ref 135–145)

## 2019-10-07 LAB — MAGNESIUM: Magnesium: 2.1 mg/dL (ref 1.7–2.4)

## 2019-10-07 MED ORDER — ADULT MULTIVITAMIN W/MINERALS CH: 1.0000 | ORAL_TABLET | Freq: Every day | ORAL | 0 refills | Status: AC

## 2019-10-07 MED ORDER — OMEPRAZOLE 20 MG PO CPDR: 20.0000 mg | DELAYED_RELEASE_CAPSULE | Freq: Every day | ORAL | 0 refills | Status: DC

## 2019-10-07 MED ORDER — ENSURE ENLIVE PO LIQD: 237.0000 mL | Freq: Two times a day (BID) | ORAL | 0 refills | Status: AC

## 2019-10-07 MED ORDER — AMOXICILLIN-POT CLAVULANATE 875-125 MG PO TABS
1.0000 | ORAL_TABLET | Freq: Two times a day (BID) | ORAL | Status: DC
  Administered 2019-10-07 – 2019-10-08 (×4): 1 via ORAL
  Filled 2019-10-07 (×4): qty 1

## 2019-10-07 MED ORDER — AMOXICILLIN-POT CLAVULANATE 875-125 MG PO TABS: 1.0000 | ORAL_TABLET | Freq: Two times a day (BID) | ORAL | 0 refills | Status: DC

## 2019-10-07 NOTE — Progress Notes (Signed)
Called report to [facility]

## 2019-10-07 NOTE — Discharge Summary (Signed)
Physician Discharge Summary  Mariah Jimenez CWC:[id number] DOB: 01/22/1946 DOA: 10/04/2019  PCP: Randell Loop, [facility]  Admit date: 10/04/2019 Discharge date: 10/07/2019  Admitted From: SNF  Disposition:  SNF   Recommendations for Outpatient Follow-up and new medication changes:  1. Follow up with Primary Care in 7 days.  2. Continue antibiotic therapy with Augmentin for 5 more days.  3. Continue nutrition supplements and vitamins..   Home Health: na  Equipment/Devices: na    Discharge Condition: stable  CODE STATUS: dnr   Diet recommendation: dysphagia 1 with aspiration precautions.   Brief/Interim Summary: Patient admitted to the hospital with a working diagnosis of intractable nausea and vomiting in the setting of urinary tract infection.  74 year old female with past medical history for intellectual disability and seizures. She reported 3 days of nausea and vomiting, not able to tolerate p.o. Outpatient evaluation with abdominal x-ray showed possible small bowel obstruction she was sent to the hospital for further evaluation. On her initial physical examination her heart rate was 100 to 120, blood pressure 130/86,respiratory rate 16, oxygen saturation 97%. Patient was mildly lethargic, very restless, her lungs were clear to auscultation bilaterally, heart S1-S2, present rhythmic, her abdomen was soft, non pitting lower extremity edema. Sodium 133, potassium 3.4, chloride 97, bicarb 23, glucose 99, BUN 31, creatinine 0.79, anion gap 13, lipase 55, AST 24, ALT 21, white count 9.2, hemoglobin 11.4, hematocrit 35.1, platelets 365. SARS COVID-19 negative. Urinalysis specific gravity greater than 1.046, 21-50 red cells, 11-20 white cells. CT of the abdomen with findings consistent with cystitis, small amount of pericholecystic fluid and slightly pericholecystic haziness, no gallbladder thickening or stones, questionable early cholecystitis. Chest radiograph,  hypopenetration, possible bilateral small pleural effusions, hilar vascular congestion. Right upper quadrant ultrasound with normal gallbladder. EKG 123 bpm, normal axis, normal intervals, sinus rhythm, Q wave V1 and V2, no ST segment or T wave changes, positive baseline artifact.  Patient had one dose of fosfomycin on admission, but clinically patient with complicated infection, likely more than simple cystitis, considering her comorbid conditions and risk of multidrug resistant bacteria, she recievied antibiotic therapy with IV ceftriaxone, with good toleration.   1.  Urinary tract infection, complicated with intractable nausea and vomiting.  Patient was admitted to the medical ward, she received IV antibiotic therapy with ceftriaxone.  Supportive medical therapy with intravenous fluids, antiacid therapy and as needed antiemetics.  Her diet was advanced successfully with no further nausea vomiting.  Her cultures remain no growth.  Patient will continue antibiotic therapy with Augmentin for 5 more days.  Continue antiacid therapy with omeprazole.   2.  Seizure disorder.  No active seizures during her hospitalization, continue her antiepileptic regimen with Keppra, phenobarbital and phenytoin.  3.  Cognitive impairment/swallow dysfunction..  Patient has been nonambulatory for about 2 years, bedridden, speech impairment.  Patient was evaluated by speech therapy, she was continued mild aspiration risk.  Continue aspiration precautions supportive feeding, supervised meals.  Crush medications for safety. Patient will continue with a dysphagia 1 diet.  4.  Stage II sacral ulcer, present on admission.  Continue local wound care.  5.  Hypokalemia.  Patient had potassium chloride, with correction of hypokalemia, at discharge her potassium was 3.6, magnesium 2.1, bicarb 23 and serum creatinine 0.61.  6. Obesity class 3. Calculated BMI is 45.2/    Discharge Diagnoses:  Principal Problem:    Intractable nausea and vomiting Active Problems:   Seizure disorder (HCC)   UTI (urinary tract infection)  Intellectual disability   Pressure injury of skin   Dehydration   Class 3 obesity    Discharge Instructions   Allergies as of 10/07/2019   No Known Allergies     Medication List    TAKE these medications   acetaminophen 325 MG tablet Commonly known as: TYLENOL Take 650 mg by mouth every 6 (six) hours as needed.   amoxicillin-clavulanate 875-125 MG tablet Commonly known as: AUGMENTIN Take 1 tablet by mouth every 12 (twelve) hours for 5 days.   feeding supplement (ENSURE ENLIVE) Liqd Take 237 mLs by mouth 2 (two) times daily between meals.   levETIRAcetam 500 MG tablet Commonly known as: KEPPRA Take 1,500 mg by mouth 2 (two) times daily.   loratadine 10 MG tablet Commonly known as: CLARITIN Take 10 mg by mouth daily.   multivitamin with minerals Tabs tablet Take 1 tablet by mouth daily.   omeprazole 20 MG capsule Commonly known as: PRILOSEC Take 1 capsule (20 mg total) by mouth daily.   ondansetron 4 MG tablet Commonly known as: Zofran Take 1 tablet (4 mg total) by mouth daily as needed.   phenobarbital 16.2 MG tablet Commonly known as: LUMINAL Take 3 tablets (48.6 mg total) by mouth 3 (three) times daily.   phenytoin 100 MG ER capsule Commonly known as: DILANTIN Take 300 mg by mouth at bedtime.   promethazine 25 MG tablet Commonly known as: PHENERGAN Take 25-50 mg by mouth every 8 (eight) hours as needed.   SPS 15 GM/60ML suspension Generic drug: sodium polystyrene Take 60 mLs by mouth every Wednesday.   triamcinolone 0.1 % cream : eucerin Crea Apply 1 application topically 3 (three) times daily.       No Known Allergies      Procedures/Studies: DG Chest 2 View  Result Date: 10/04/2019 CLINICAL DATA:  Nausea vomiting EXAM: CHEST - 2 VIEW COMPARISON:  08/18/2019 FINDINGS: Limited lateral view due to soft tissue artifact.  Cardiomegaly with central vascular congestion. Patchy atelectasis at the left base. No definitive effusion. No pneumothorax. IMPRESSION: Cardiomegaly with central vascular congestion. Probable patchy atelectasis left base Electronically Signed   By: Jasmine Pang M.D.   On: 10/04/2019 17:57   CT ABDOMEN PELVIS W CONTRAST  Result Date: 10/04/2019 CLINICAL DATA:  Nausea, vomiting, and diarrhea. EXAM: CT ABDOMEN AND PELVIS WITH CONTRAST TECHNIQUE: Multidetector CT imaging of the abdomen and pelvis was performed using the standard protocol following bolus administration of intravenous contrast. CONTRAST:  OMNIPAQUE IOHEXOL 350 MG/ML SOLN COMPARISON:  Abdominal radiograph dated 08/18/2019 FINDINGS: Lower chest: Tiny bilateral pleural effusions with slight bibasilar atelectasis. Cardiomegaly. Hepatobiliary: Liver parenchyma is normal. There is a tiny amount of pericholecystic fluid and slight pericholecystic haziness with the gallbladder is not distended in the wall is not thickened. No visible stones. No biliary ductal dilatation. Pancreas: Unremarkable. No pancreatic ductal dilatation or surrounding inflammatory changes. Spleen: Normal in size without focal abnormality. 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 Right adrenal gland is normal. Small benign-appearing cysts in both kidneys. No hydronephrosis. There is thickening of the bladder wall haziness around the bladder with enhancement of the mucosa, suggestive of cystitis. Stomach/Bowel: Stomach is within normal limits. Appendix appears normal. No evidence of bowel wall thickening, distention, or inflammatory changes. Vascular/Lymphatic: No significant vascular findings are present. No enlarged abdominal or pelvic lymph nodes. Reproductive: Uterus and bilateral adnexa are unremarkable. Other: No abdominal wall hernia or abnormality. No abdominopelvic ascites. Musculoskeletal: No acute or significant osseous findings. IMPRESSION:  1. Findings consistent with cystitis. 2. Tiny amount of pericholecystic fluid and slight pericholecystic haziness without gallbladder wall thickening or visible stones. This could represent early cholecystitis. 3. Tiny bilateral pleural effusions with slight bibasilar atelectasis. 4. 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 Electronically Signed   By: Francene BoyersJames  Maxwell M.D.   On: 10/04/2019 14:13   US ABDOMEN LIMITED RUQ  Result Date: 10/04/2019 CLINICAL DATA:  Pain and nausea EXAM: ULTRASOUND ABDOMEN LIMITED RIGHT UPPER QUADRANT COMPARISON:  CT 10/04/2019 FINDINGS: Gallbladder: No gallstones or wall thickening visualized. No sonographic Murphy sign noted by sonographer. Common bile duct: Diameter: 3.2 mm Liver: No focal lesion identified. Within normal limits in parenchymal echogenicity. Portal vein is patent on color Doppler imaging with normal direction of blood flow towards the liver. Other: None. IMPRESSION: Ultrasound appearance of the gallbladder is within normal limits. Electronically Signed   By: Jasmine PangKim  Fujinaga M.D.   On: 10/04/2019 15:16       Subjective: Patient is awake and alert, not communicative at her baseline, no nausea or vomiting and tolerating po well. She continue to need assistance for feedings.   Discharge Exam: Vitals:   10/07/19 0650 10/07/19 0814  BP: 111/76 126/82  Pulse: 99 95  Resp: 15 20  Temp: 98.4 F (36.9 C) 97.9 F (36.6 C)  SpO2: 98% 98%   Vitals:   10/06/19 2118 10/06/19 2311 10/07/19 0650 10/07/19 0814  BP: 134/72 140/70 111/76 126/82  Pulse: (!) 110 (!) 106 99 95  Resp: 16 15 15 20   Temp: 97.9 F (36.6 C) 98.4 F (36.9 C) 98.4 F (36.9 C) 97.9 F (36.6 C)  TempSrc: Oral Oral Oral   SpO2: 100% 95% 98% 98%  Weight:      Height:        General: Not in pain or dyspnea Neurology: Awake and alert, non focal. Not verbal  E ENT: no pallor, no icterus, oral mucosa moist Cardiovascular: No JVD. S1-S2 present, rhythmic, no gallops, rubs,  or murmurs. Non pitting  lower extremity edema (++). Pulmonary: positive breath sounds bilaterally, adequate air movement, no wheezing, rhonchi or rales. Gastrointestinal. Abdomen protuberant with no organomegaly, non tender, no rebound or guarding Skin. No rashes Musculoskeletal: no joint deformities   The results of significant diagnostics from this hospitalization (including imaging, microbiology, ancillary and laboratory) are listed below for reference.     Microbiology: Recent Results (from the past 240 hour(s))  Urine culture     Status: Abnormal   Collection Time: 10/04/19 12:44 PM   Specimen: Urine, Random  Result Value Ref Range Status   Specimen Description   Final    URINE, RANDOM Performed at [facility], [address] [place], [place] [address]    Special Requests   Final    NONE Performed at [facility], [address] [place], [place] [address]    Culture (A)  Final    >=100,000 COLONIES/mL MULTIPLE SPECIES PRESENT, SUGGEST RECOLLECTION   Report Status 10/06/2019 FINAL  Final  SARS Coronavirus 2 by RT PCR (hospital order, performed in [facility] hospital lab) Nasopharyngeal Nasopharyngeal Swab     Status: None   Collection Time: 10/04/19  8:41 PM   Specimen: Nasopharyngeal Swab  Result Value Ref Range Status   SARS Coronavirus 2 NEGATIVE NEGATIVE Final    Comment: (NOTE) SARS-CoV-2 target nucleic acids are NOT DETECTED.  The SARS-CoV-2 RNA is generally detectable in upper and lower respiratory specimens during the acute phase of infection. The  lowest concentration of SARS-CoV-2 viral copies this assay can detect is 250 copies / mL. A negative result does not preclude SARS-CoV-2 infection and should not be used as the sole basis for treatment or other patient management decisions.  A negative result may occur with improper specimen collection / handling, submission of specimen other than nasopharyngeal swab, presence of viral  mutation(s) within the areas targeted by this assay, and inadequate number of viral copies (<250 copies / mL). A negative result must be combined with clinical observations, patient history, and epidemiological information.  Fact Sheet for Patients:   BoilerBrush.com.cy  Fact Sheet for Healthcare Providers: https://pope.com/  This test is not yet approved or  cleared by the [place] FDA and has been authorized for detection and/or diagnosis of SARS-CoV-2 by FDA under an Emergency Use Authorization (EUA).  This EUA will remain in effect (meaning this test can be used) for the duration of the COVID-19 declaration under Section 564(b)(1) of the Act, 21 U.S.C. section 360bbb-3(b)(1), unless the authorization is terminated or revoked sooner.  Performed at [facility], [address] [place], [place] [address]      Labs: BNP (last 3 results) Recent Labs    08/18/19 1337 10/05/19 0038  BNP 32.0 35.9   Basic Metabolic Panel: Recent Labs  Lab 10/04/19 1244 10/05/19 0425 10/07/19 0421  NA 133* 139 141  K 3.4* 3.4* 3.6  CL 97* 104 108  CO2 23 21* 23  GLUCOSE 99 103* 104*  BUN 31* 25* 16  CREATININE 0.79 0.74 0.61  CALCIUM 8.7* 8.4* 8.4*  MG  --   --  2.1   Liver Function Tests: Recent Labs  Lab 10/04/19 1244  AST 24  ALT 21  ALKPHOS 203*  BILITOT 1.2  PROT 8.0  ALBUMIN 2.6*   Recent Labs  Lab 10/04/19 1244  LIPASE 55*   No results for input(s): AMMONIA in the last 168 hours. CBC: Recent Labs  Lab 10/04/19 1244  WBC 9.2  HGB 11.4*  HCT 35.1*  MCV 84.6  PLT 365   Cardiac Enzymes: No results for input(s): CKTOTAL, CKMB, CKMBINDEX, TROPONINI in the last 168 hours. BNP: Invalid input(s): POCBNP CBG: No results for input(s): GLUCAP in the last 168 hours. D-Dimer No results for input(s): DDIMER in the last 72 hours. Hgb A1c No results for input(s): HGBA1C in the last 72 hours. Lipid  Profile No results for input(s): CHOL, HDL, LDLCALC, TRIG, CHOLHDL, LDLDIRECT in the last 72 hours. Thyroid function studies Recent Labs    10/04/19 1244  TSH 3.808   Anemia work up No results for input(s): VITAMINB12, FOLATE, FERRITIN, TIBC, IRON, RETICCTPCT in the last 72 hours. Urinalysis    Component Value Date/Time   COLORURINE YELLOW (A) 10/04/2019 1244   APPEARANCEUR HAZY (A) 10/04/2019 1244   LABSPEC >1.046 (H) 10/04/2019 1244   PHURINE 7.0 10/04/2019 1244   GLUCOSEU NEGATIVE 10/04/2019 1244   HGBUR SMALL (A) 10/04/2019 1244   BILIRUBINUR NEGATIVE 10/04/2019 1244   KETONESUR 5 (A) 10/04/2019 1244   PROTEINUR 30 (A) 10/04/2019 1244   UROBILINOGEN 0.2 05/20/2013 0437   NITRITE NEGATIVE 10/04/2019 1244   LEUKOCYTESUR LARGE (A) 10/04/2019 1244   Sepsis Labs Invalid input(s): PROCALCITONIN,  WBC,  LACTICIDVEN Microbiology Recent Results (from the past 240 hour(s))  Urine culture     Status: Abnormal   Collection Time: 10/04/19 12:44 PM   Specimen: Urine, Random  Result Value Ref Range Status   Specimen Description   Final  URINE, RANDOM Performed at [facility], [address] [place], [place] [address]    Special Requests   Final    NONE Performed at [facility], [address] [place], [place] [address]    Culture (A)  Final    >=100,000 COLONIES/mL MULTIPLE SPECIES PRESENT, SUGGEST RECOLLECTION   Report Status 10/06/2019 FINAL  Final  SARS Coronavirus 2 by RT PCR (hospital order, performed in [facility] hospital lab) Nasopharyngeal Nasopharyngeal Swab     Status: None   Collection Time: 10/04/19  8:41 PM   Specimen: Nasopharyngeal Swab  Result Value Ref Range Status   SARS Coronavirus 2 NEGATIVE NEGATIVE Final    Comment: (NOTE) SARS-CoV-2 target nucleic acids are NOT DETECTED.  The SARS-CoV-2 RNA is generally detectable in upper and lower respiratory specimens during the acute phase of infection. The lowest concentration of  SARS-CoV-2 viral copies this assay can detect is 250 copies / mL. A negative result does not preclude SARS-CoV-2 infection and should not be used as the sole basis for treatment or other patient management decisions.  A negative result may occur with improper specimen collection / handling, submission of specimen other than nasopharyngeal swab, presence of viral mutation(s) within the areas targeted by this assay, and inadequate number of viral copies (<250 copies / mL). A negative result must be combined with clinical observations, patient history, and epidemiological information.  Fact Sheet for Patients:   StrictlyIdeas.no  Fact Sheet for Healthcare Providers: BankingDealers.co.za  This test is not yet approved or  cleared by the [place] FDA and has been authorized for detection and/or diagnosis of SARS-CoV-2 by FDA under an Emergency Use Authorization (EUA).  This EUA will remain in effect (meaning this test can be used) for the duration of the COVID-19 declaration under Section 564(b)(1) of the Act, 21 U.S.C. section 360bbb-3(b)(1), unless the authorization is terminated or revoked sooner.  Performed at [facility], [address], [place], [place] [address]      Time coordinating discharge: 45 minutes  SIGNED:   Tawni Millers, MD  [facility] 10/07/2019, 11:48 AM

## 2019-10-07 NOTE — Progress Notes (Signed)
Pt projectile vomiting apx 1-1.5 liters of yellow fluid when getting ready for ems. Notified MD. Discharged ordered canceled. Notified EMS and family (ruby).

## 2019-10-07 NOTE — TOC Transition Note (Signed)
Transition of Care [facility]) - CM/SW Discharge Note   Patient Details  Name: Mariah Jimenez MRN: [id number] Date of Birth: 19-Oct-1945  [facility] CM/SW Contact:  Maud Deed, LCSW Phone Number: 10/07/2019, 2:07 PM   Clinical Narrative:    Pt is medically stable for discharge per MD. Pt will be transported via EMS back to [facility] to room 20A, call to report number is [phone]. CSW notified pt's sister of discharge.    Final next level of care: Long Term Acute Care (LTAC) Barriers to Discharge: No Barriers Identified   Patient Goals and CMS Choice        Discharge Placement                Patient to be transferred to facility by: EMS Name of family member notified: [name] Patient and family notified of of transfer: 10/07/19  Discharge Plan and Services                                     Social Determinants of Health (SDOH) Interventions     Readmission Risk Interventions No flowsheet data found.

## 2019-10-08 ENCOUNTER — Inpatient Hospital Stay: Payer: Medicare Other

## 2019-10-08 DIAGNOSIS — R112 Nausea with vomiting, unspecified: Secondary | ICD-10-CM | POA: Diagnosis not present

## 2019-10-08 DIAGNOSIS — Z4682 Encounter for fitting and adjustment of non-vascular catheter: Secondary | ICD-10-CM | POA: Diagnosis not present

## 2019-10-08 MED ORDER — METOCLOPRAMIDE HCL 5 MG PO TABS
5.0000 mg | ORAL_TABLET | Freq: Three times a day (TID) | ORAL | Status: DC
  Administered 2019-10-08 – 2019-10-09 (×3): 5 mg via ORAL
  Filled 2019-10-08 (×3): qty 1

## 2019-10-08 NOTE — Progress Notes (Addendum)
Placed NG at bedside pt was uncooperative and kept pulling head away and pulling nurses hands away during placement. Pt kept pulling at tube once placed. Notified MD requested restraints and stat xray. Xray showed tube was in lungs. Pulled tube. Notified MD. D/c restraint order. Pt's VSS. NG tube will need to placed in IR fluoroscope will be tomorrow. Will keep pt NPO and continue fluids per MD. Pt had large episode of emesis during NG placement.

## 2019-10-08 NOTE — Consult Note (Addendum)
Melodie Bouillon, MD [address], [place], [place], [address] [address], [address], [place], [place], [address] Phone: [phone]  Fax: [phone]  Consultation  Referring Provider:     Dr. Nelson Chimes Primary Care Physician:  Randell Loop, [facility] for Consultation:    Gastric outlet obstruction  Date of Admission:  10/04/2019 Date of Consultation:  10/08/2019         HPI:   Mariah Jimenez is a 74 y.o. female with intellectual disability, lives in a nursing facility, admitted with nausea vomiting in the setting of UTI.  Patient was treated for UTI and was planning on being discharged yesterday but had recurrent nausea vomiting and abdominal x-ray showed distended stomach, possible gastric outlet obstruction or gastroparesis with "stomach is distended and filled with air and debris".  History was provided by patient's sister at bedside.  She reports that patient has had nausea and vomiting for 3 to 4 weeks and this is unlike her.  She states she feeds herself and has a good appetite otherwise.  No prior EGD or colonoscopy.  CT abdomen 4 days ago showed cystitis, tiny amount of pericholecystic fluid without gallbladder thickening, normal stomach, no evidence of bowel wall thickening.  Past Medical History:  Diagnosis Date   Mental retardation    Seizures (HCC)     Past Surgical History:  Procedure Laterality Date   NO PAST SURGERIES     TIBIA IM NAIL INSERTION Right 04/09/2016   Procedure: INTRAMEDULLARY (IM) NAIL TIBIAL;  Surgeon: Christena Flake, MD;  Location: [facility] ORS;  Service: Orthopedics;  Laterality: Right;    Prior to Admission medications   Medication Sig Start Date End Date Taking? Authorizing Provider  levETIRAcetam (KEPPRA) 500 MG tablet Take 1,500 mg by mouth 2 (two) times daily.   Yes [provider]  loratadine (CLARITIN) 10 MG tablet Take 10 mg by mouth daily.   Yes [provider]  PHENobarbital (LUMINAL)  16.2 MG tablet Take 3 tablets (48.6 mg total) by mouth 3 (three) times daily. 08/21/19  Yes Sreenath, Sudheer B, MD  phenytoin (DILANTIN) 100 MG ER capsule Take 300 mg by mouth at bedtime.   Yes [provider]  promethazine (PHENERGAN) 25 MG tablet Take 25-50 mg by mouth every 8 (eight) hours as needed. 10/02/19  Yes [provider]  SPS 15 GM/60ML suspension Take 60 mLs by mouth every Wednesday. 06/16/19  Yes [provider]  Triamcinolone Acetonide (TRIAMCINOLONE 0.1 % CREAM : EUCERIN) CREA Apply 1 application topically 3 (three) times daily.    Yes [provider]  acetaminophen (TYLENOL) 325 MG tablet Take 650 mg by mouth every 6 (six) hours as needed.    [provider]  amoxicillin-clavulanate (AUGMENTIN) 875-125 MG tablet Take 1 tablet by mouth every 12 (twelve) hours for 5 days. 10/07/19 10/12/19  Arrien, York Ram, MD  feeding supplement, ENSURE ENLIVE, (ENSURE ENLIVE) LIQD Take 237 mLs by mouth 2 (two) times daily between meals. 10/07/19 11/06/19  Arrien, York Ram, MD  Multiple Vitamin (MULTIVITAMIN WITH MINERALS) TABS tablet Take 1 tablet by mouth daily. 10/07/19 11/06/19  Arrien, York Ram, MD  omeprazole (PRILOSEC) 20 MG capsule Take 1 capsule (20 mg total) by mouth daily. 10/07/19 11/06/19  Arrien, York Ram, MD  ondansetron (ZOFRAN) 4 MG tablet Take 1 tablet (4 mg total) by mouth daily as needed. 10/04/19 10/03/20  Willy Eddy, MD    Family History  Family history unknown: Yes  Social History   Tobacco Use   Smoking status: Never Smoker   Smokeless tobacco: Never Used  Substance Use Topics   Alcohol use: No   Drug use: No    Allergies as of 10/04/2019   (No Known Allergies)    Review of Systems:    All systems reviewed and negative except where noted in HPI.   Physical Exam:  Vital signs in last 24 hours: Vitals:   10/07/19 2109 10/08/19 0456 10/08/19 0817 10/08/19 1126  BP: 99/60 130/79 124/71  (!) 141/73  Pulse: 94 97 (!) 102 94  Resp: 15 14    Temp: 97.8 F (36.6 C) 98.2 F (36.8 C) 97.7 F (36.5 C) 97.7 F (36.5 C)  TempSrc: Oral     SpO2: 100% 100% 98% 98%  Weight:      Height:       Last BM Date: 10/07/19 General:   Pleasant, cooperative in NAD Head:  Normocephalic and atraumatic. Eyes:   No icterus.   Conjunctiva pink. PERRLA. Ears:  Normal auditory acuity. Neck:  Supple; no masses or thyroidomegaly Lungs: Respirations even and unlabored. Lungs clear to auscultation bilaterally.   No wheezes, crackles, or rhonchi.  Abdomen:  Soft, distended, nontender. Normal bowel sounds. No appreciable masses or hepatomegaly.  No rebound or guarding.  Neurologic:  Alert and oriented x3;  grossly normal neurologically. Skin:  Intact without significant lesions or rashes. Cervical Nodes:  No significant cervical adenopathy. Psych:  Alert and cooperative. Normal affect.  LAB RESULTS: No results for input(s): WBC, HGB, HCT, PLT in the last 72 hours. BMET Recent Labs    10/07/19 0421  NA 141  K 3.6  CL 108  CO2 23  GLUCOSE 104*  BUN 16  CREATININE 0.61  CALCIUM 8.4*   LFT No results for input(s): PROT, ALBUMIN, AST, ALT, ALKPHOS, BILITOT, BILIDIR, IBILI in the last 72 hours. PT/INR No results for input(s): LABPROT, INR in the last 72 hours.  STUDIES: DG Abd 1 View  Result Date: 10/08/2019 CLINICAL DATA:  Nausea and vomiting. EXAM: ABDOMEN - 1 VIEW COMPARISON:  Radiograph 08/18/2019 and CT scan 10/04/2019 FINDINGS: The stomach is distended and filled with air and debris. Possible gastric outlet obstruction or gastroparesis. No findings suspicious for small bowel obstruction or free air. IMPRESSION: Distended stomach. Possible gastric outlet obstruction or gastroparesis. Electronically Signed   By: Rudie Meyer M.D.   On: 10/08/2019 11:06      Impression / Plan:   Mariah Jimenez is a 74 y.o. y/o female with nausea vomiting and gastric distention on x-ray  Given  debris noted in the stomach, upper endoscopy would have high risks of aspiration during the procedure.  In addition patient is DNR  Would recommend surgery consult at this time as well  Could consider upper endoscopy after placement of NG tube, and removing some of the stomach contents to avoid aspiration during the procedure.  Keep patient n.p.o.  NG tube was ordered by primary team, but still has not been placed as of 3:52 PM today.  Possible upper endoscopy tomorrow, or earlier if needed depending on clinical status  Above discussed with primary team, Dr. Nelson Chimes  PPI IV BID   Thank you for involving me in the care of this patient.      LOS: 3 days   Pasty Spillers, MD  10/08/2019, 3:33 PM

## 2019-10-08 NOTE — Progress Notes (Signed)
SLP Cancellation Note  Patient Details Name: Mariah Jimenez MRN: [id number] DOB: [birth year]   Cancelled treatment:       Reason Eval/Treat Not Completed: Medical issues which prohibited therapy (chart reviewed). Pt projectile vomited approx. 1-1.5 liters of yellow fluid when getting ready for ems at discharge yesterday per chart notes. Pt had been upgraded to a solid foods diet per MD orders w/ adequate toleration over the weekend according to chart notes(no reported issues).  Will continue to follow pt during stay for any further needs, education as pt upgrades to an oral diet again. Recommend general aspiration precautions and Pill in Puree for precaution.    Jerilynn Som, MS, CCC-SLP [name] 10/08/2019, 2:23 PM

## 2019-10-08 NOTE — Progress Notes (Signed)
PROGRESS NOTE    Mariah Jimenez  KDX:[id number] DOB: Apr 03, 1946 DOA: 10/04/2019 PCP: Randell Loop, [facility]   Brief Narrative:  74 year old female with past medical history for intellectual disability and seizures. She reported 3 days of nausea and vomiting, not able to tolerate p.o. Outpatient evaluation with abdominal x-ray showed possible small bowel obstruction she was sent to the hospital for further evaluation.  CT of the abdomen with findings consistent with cystitis, small amount of pericholecystic fluid and slightly pericholecystic haziness, no gallbladder thickening or stones, questionable early cholecystitis. Chest radiograph, hypopenetration, possible bilateral small pleural effusions, hilar vascular congestion. Right upper quadrant ultrasound with normal gallbladder. She was able to tolerate p.o. yesterday and was discharge to her facility.  Apparently patient developed projectile vomiting before arrival of EMS, resulted in cancellation of discharge. Repeat KUB today with distended abdomen, no small bowel obstruction. GI was consulted for a possible gastric outlet obstruction.  Subjective: Patient developed vomiting yesterday evening again.  Per nursing staff she had a projectile vomiting of yellow-colored stomach contents.  Patient is nonverbal.  Sister was in the room and according to her patient is bedridden and completely dependent for all her ADLs for the past 2 years.  Normally she has good appetite.  She was having nausea and vomiting for the past couple of weeks which is very unusual for her.  Assessment & Plan:   Principal Problem:   Intractable nausea and vomiting Active Problems:   Seizure disorder (HCC)   UTI (urinary tract infection)   Intellectual disability   Pressure injury of skin   Dehydration   Class 3 obesity  Vomiting/gastric outlet obstruction/gastroparesis.  Patient was having epigastric distention and tenderness on exam today.   Good bowel sounds.  KUB was obtained which shows distended abdomen with liquid and food debris's.  No small bowel obstruction. -GI was consulted for a possible gastric outlet obstruction-night get her EGD tomorrow, concern of aspiration due to gastric distention. -NG tube was placed. -N.p.o. -Might get her EGD tomorrow. -Continue Protonix IV. -Start her on Reglan.  UTI.  Received one-time dose of fosfomycin and then started on ceftriaxone for the concern of complicated UTI. Urine cultures with multiple organisms. -She completed the course of antibiotics now.  Seizure disorder.  No acute concern. -Continue home dose of Keppra, phenobarbital and phenytoin.  Cognitive impairment/swallow dysfunction.  Patient is bedridden, nonverbal and completely dependent for all her ADLs for the past 2 years. She was evaluated by speech therapy and they were recommending dysphagia 1 diet. -Currently n.p.o.  Stage II sacral ulcer, present on admission.  - Continue local wound care.  Hypokalemia.  Resolved with repletion. -Continue to monitor. -Repleat as needed  Obesity class 3.  BMI is 45. Challenging treatment due to cognitive impairment. -Can complicate overall prognosis.  Objective: Vitals:   10/07/19 2109 10/08/19 0456 10/08/19 0817 10/08/19 1126  BP: 99/60 130/79 124/71 (!) 141/73  Pulse: 94 97 (!) 102 94  Resp: 15 14    Temp: 97.8 F (36.6 C) 98.2 F (36.8 C) 97.7 F (36.5 C) 97.7 F (36.5 C)  TempSrc: Oral     SpO2: 100% 100% 98% 98%  Weight:      Height:        Intake/Output Summary (Last 24 hours) at 10/08/2019 1656 Last data filed at 10/08/2019 0505 Gross per 24 hour  Intake --  Output 300 ml  Net -300 ml   Filed Weights   10/04/19 1239  Weight: 135 kg  Examination:  General exam: Appears calm and comfortable.  Nonverbal. Respiratory system: Clear to auscultation. Respiratory effort normal. Cardiovascular system: S1 & S2 heard, RRR. No JVD, murmurs,  Gastrointestinal system: Soft, gastric distention and tenderness, bowel sounds positive. Central nervous system: Alert, nonverbal and unable to participate with exam. Extremities: No edema, no cyanosis, pulses intact and symmetrical. Psychiatry: Judgement and insight appear impaired.  DVT prophylaxis: Lovenox Code Status: DNR Family Communication: Sister was updated at bedside Disposition Plan:  Status is: Inpatient  Remains inpatient appropriate because:Inpatient level of care appropriate due to severity of illness   Dispo: The patient is from: SNF              Anticipated d/c is to: SNF              Anticipated d/c date is: 2 days              Patient currently is not medically stable to d/c.  Can developed vomiting again.  Concern of gastric outlet obstruction.  Consultants:   GI  Procedures:  Antimicrobials:   Data Reviewed: I have personally reviewed following labs and imaging studies  CBC: Recent Labs  Lab 10/04/19 1244  WBC 9.2  HGB 11.4*  HCT 35.1*  MCV 84.6  PLT 365   Basic Metabolic Panel: Recent Labs  Lab 10/04/19 1244 10/05/19 0425 10/07/19 0421  NA 133* 139 141  K 3.4* 3.4* 3.6  CL 97* 104 108  CO2 23 21* 23  GLUCOSE 99 103* 104*  BUN 31* 25* 16  CREATININE 0.79 0.74 0.61  CALCIUM 8.7* 8.4* 8.4*  MG  --   --  2.1   GFR: Estimated Creatinine Clearance: 92.6 mL/min (by C-G formula based on SCr of 0.61 mg/dL). Liver Function Tests: Recent Labs  Lab 10/04/19 1244  AST 24  ALT 21  ALKPHOS 203*  BILITOT 1.2  PROT 8.0  ALBUMIN 2.6*   Recent Labs  Lab 10/04/19 1244  LIPASE 55*   No results for input(s): AMMONIA in the last 168 hours. Coagulation Profile: No results for input(s): INR, PROTIME in the last 168 hours. Cardiac Enzymes: No results for input(s): CKTOTAL, CKMB, CKMBINDEX, TROPONINI in the last 168 hours. BNP (last 3 results) No results for input(s): PROBNP in the last 8760 hours. HbA1C: No results for input(s): HGBA1C in  the last 72 hours. CBG: No results for input(s): GLUCAP in the last 168 hours. Lipid Profile: No results for input(s): CHOL, HDL, LDLCALC, TRIG, CHOLHDL, LDLDIRECT in the last 72 hours. Thyroid Function Tests: No results for input(s): TSH, T4TOTAL, FREET4, T3FREE, THYROIDAB in the last 72 hours. Anemia Panel: No results for input(s): VITAMINB12, FOLATE, FERRITIN, TIBC, IRON, RETICCTPCT in the last 72 hours. Sepsis Labs: Recent Labs  Lab 10/04/19 1244  LATICACIDVEN 1.0    Recent Results (from the past 240 hour(s))  Urine culture     Status: Abnormal   Collection Time: 10/04/19 12:44 PM   Specimen: Urine, Random  Result Value Ref Range Status   Specimen Description   Final    URINE, RANDOM Performed at [facility], [address], [place], [place] [address]    Special Requests   Final    NONE Performed at [facility], [address] [place], [place] [address]    Culture (A)  Final    >=100,000 COLONIES/mL MULTIPLE SPECIES PRESENT, SUGGEST RECOLLECTION   Report Status 10/06/2019 FINAL  Final  SARS Coronavirus 2 by RT PCR (hospital order, performed in  [facility] hospital lab) Nasopharyngeal Nasopharyngeal Swab     Status: None   Collection Time: 10/04/19  8:41 PM   Specimen: Nasopharyngeal Swab  Result Value Ref Range Status   SARS Coronavirus 2 NEGATIVE NEGATIVE Final    Comment: (NOTE) SARS-CoV-2 target nucleic acids are NOT DETECTED.  The SARS-CoV-2 RNA is generally detectable in upper and lower respiratory specimens during the acute phase of infection. The lowest concentration of SARS-CoV-2 viral copies this assay can detect is 250 copies / mL. A negative result does not preclude SARS-CoV-2 infection and should not be used as the sole basis for treatment or other patient management decisions.  A negative result may occur with improper specimen collection / handling, submission of specimen other than nasopharyngeal swab, presence of viral  mutation(s) within the areas targeted by this assay, and inadequate number of viral copies (<250 copies / mL). A negative result must be combined with clinical observations, patient history, and epidemiological information.  Fact Sheet for Patients:   StrictlyIdeas.no  Fact Sheet for Healthcare Providers: BankingDealers.co.za  This test is not yet approved or  cleared by the [place] FDA and has been authorized for detection and/or diagnosis of SARS-CoV-2 by FDA under an Emergency Use Authorization (EUA).  This EUA will remain in effect (meaning this test can be used) for the duration of the COVID-19 declaration under Section 564(b)(1) of the Act, 21 U.S.C. section 360bbb-3(b)(1), unless the authorization is terminated or revoked sooner.  Performed at [facility], [address], [place], [place] [address]      Radiology Studies: DG Abd 1 View  Result Date: 10/08/2019 CLINICAL DATA:  Nausea and vomiting. EXAM: ABDOMEN - 1 VIEW COMPARISON:  Radiograph 08/18/2019 and CT scan 10/04/2019 FINDINGS: The stomach is distended and filled with air and debris. Possible gastric outlet obstruction or gastroparesis. No findings suspicious for small bowel obstruction or free air. IMPRESSION: Distended stomach. Possible gastric outlet obstruction or gastroparesis. Electronically Signed   By: Marijo Sanes M.D.   On: 10/08/2019 11:06    Scheduled Meds:  amoxicillin-clavulanate  1 tablet Oral Q12H   enoxaparin (LOVENOX) injection  40 mg Subcutaneous Q24H   feeding supplement  1 Container Oral Q breakfast   feeding supplement (ENSURE ENLIVE)  237 mL Oral BID BM   levETIRAcetam  1,500 mg Oral BID   loratadine  10 mg Oral Daily   metoCLOPramide  5 mg Oral TID AC & HS   multivitamin with minerals  1 tablet Oral Daily   pantoprazole sodium  40 mg Per Tube BID   phenobarbital  48.6 mg Oral TID   phenytoin  300 mg Oral QHS    sodium chloride flush  10-40 mL Intracatheter Q12H   Continuous Infusions:  dextrose 5% lactated ringers 50 mL/hr at 10/06/19 1900     LOS: 3 days   Time spent: 45 minutes.  Lorella Nimrod, MD [facility]  If 7PM-7AM, please contact night-coverage [email or web address]  10/08/2019, 4:56 PM   This record has been created using Systems analyst. Errors have been sought and corrected,but may not always be located. Such creation errors do not reflect on the standard of care.

## 2019-10-09 ENCOUNTER — Inpatient Hospital Stay: Payer: Medicare Other

## 2019-10-09 ENCOUNTER — Inpatient Hospital Stay: Payer: Medicare Other | Admitting: Anesthesiology

## 2019-10-09 ENCOUNTER — Encounter: Payer: Self-pay | Admitting: Internal Medicine

## 2019-10-09 ENCOUNTER — Encounter
Admission: EM | Disposition: A | Discharge status: Skilled Nursing Facility | Payer: Self-pay | Source: Home / Self Care | Attending: Internal Medicine

## 2019-10-09 DIAGNOSIS — R112 Nausea with vomiting, unspecified: Secondary | ICD-10-CM | POA: Diagnosis not present

## 2019-10-09 DIAGNOSIS — Z4682 Encounter for fitting and adjustment of non-vascular catheter: Secondary | ICD-10-CM | POA: Diagnosis not present

## 2019-10-09 DIAGNOSIS — E860 Dehydration: Secondary | ICD-10-CM | POA: Diagnosis not present

## 2019-10-09 DIAGNOSIS — K208 Other esophagitis without bleeding: Secondary | ICD-10-CM | POA: Diagnosis not present

## 2019-10-09 DIAGNOSIS — Z4659 Encounter for fitting and adjustment of other gastrointestinal appliance and device: Secondary | ICD-10-CM | POA: Diagnosis not present

## 2019-10-09 LAB — CBC
HCT: 33.1 % — ABNORMAL LOW (ref 36.0–46.0)
Hemoglobin: 10.8 g/dL — ABNORMAL LOW (ref 12.0–15.0)
MCH: 27.1 pg (ref 26.0–34.0)
MCHC: 32.6 g/dL (ref 30.0–36.0)
MCV: 83 fL (ref 80.0–100.0)
Platelets: 353 10*3/uL (ref 150–400)
RBC: 3.99 MIL/uL (ref 3.87–5.11)
RDW: 16.5 % — ABNORMAL HIGH (ref 11.5–15.5)
WBC: 7 10*3/uL (ref 4.0–10.5)
nRBC: 0 % (ref 0.0–0.2)

## 2019-10-09 LAB — BASIC METABOLIC PANEL
Anion gap: 10 (ref 5–15)
BUN: 8 mg/dL (ref 8–23)
CO2: 24 mmol/L (ref 22–32)
Calcium: 8.7 mg/dL — ABNORMAL LOW (ref 8.9–10.3)
Chloride: 108 mmol/L (ref 98–111)
Creatinine, Ser: 0.54 mg/dL (ref 0.44–1.00)
GFR calc Af Amer: >60 mL/min (ref >60)
GFR calc non Af Amer: >60 mL/min (ref >60)
Glucose, Bld: 99 mg/dL (ref 70–99)
Potassium: 3.5 mmol/L (ref 3.5–5.1)
Sodium: 142 mmol/L (ref 135–145)

## 2019-10-09 LAB — MAGNESIUM: Magnesium: 1.8 mg/dL (ref 1.7–2.4)

## 2019-10-09 SURGERY — EGD (ESOPHAGOGASTRODUODENOSCOPY)
Anesthesia: General

## 2019-10-09 MED ORDER — METOCLOPRAMIDE HCL 5 MG PO TABS
5.0000 mg | ORAL_TABLET | Freq: Three times a day (TID) | ORAL | Status: DC
  Administered 2019-10-10 – 2019-10-16 (×24): 5 mg via ORAL
  Filled 2019-10-09 (×22): qty 1

## 2019-10-09 MED ORDER — SODIUM CHLORIDE 0.9 % IV SOLN: INTRAVENOUS | Status: DC

## 2019-10-09 NOTE — Progress Notes (Signed)
Melodie Bouillon, MD [address], [place], [place], [address] [address], [address], [place], [place], [address] Phone: [phone]  Fax: [phone]   Subjective: NG placed this morning. Pt denies abdominal pain   Objective: Exam: Vital signs in last 24 hours: Vitals:   10/08/19 2338 10/09/19 0030 10/09/19 0348 10/09/19 1033  BP: (!) 151/84 134/70 (!) 153/56 128/68  Pulse: (!) 113 (!) 101 (!) 104 94  Resp: 18 18 18    Temp: (!) 97.5 F (36.4 C) (!) 97.5 F (36.4 C) (!) 97.5 F (36.4 C) (!) 97 F (36.1 C)  TempSrc: Oral Oral Oral Temporal  SpO2: 96% 100% 98% 100%  Weight:    61.4 kg  Height:    5\' 3"  (1.6 m)   Weight change:   Intake/Output Summary (Last 24 hours) at 10/09/2019 1121 Last data filed at 10/09/2019 0300 Gross per 24 hour  Intake 1585.53 ml  Output --  Net 1585.53 ml    General: No acute distress, AAO x3 Abd: Soft, NT/ND, No HSM Skin: Warm, no rashes Neck: Supple, Trachea midline   Lab Results: Lab Results  Component Value Date   WBC 7.0 10/09/2019   HGB 10.8 (L) 10/09/2019   HCT 33.1 (L) 10/09/2019   MCV 83.0 10/09/2019   PLT 353 10/09/2019   Micro Results: Recent Results (from the past 240 hour(s))  Urine culture     Status: Abnormal   Collection Time: 10/04/19 12:44 PM   Specimen: Urine, Random  Result Value Ref Range Status   Specimen Description   Final    URINE, RANDOM Performed at [facility], [address] [place], [address] [place]    Special Requests   Final    NONE Performed at [facility], [address] [place], [address] [place]    Culture (A)  Final    >=100,000 COLONIES/mL MULTIPLE SPECIES PRESENT, SUGGEST RECOLLECTION   Report Status 10/06/2019 FINAL  Final  SARS Coronavirus 2 by RT PCR (hospital order, performed in [facility] hospital lab) Nasopharyngeal Nasopharyngeal Swab     Status: None   Collection Time: 10/04/19  8:41 PM   Specimen: Nasopharyngeal Swab  Result  Value Ref Range Status   SARS Coronavirus 2 NEGATIVE NEGATIVE Final    Comment: (NOTE) SARS-CoV-2 target nucleic acids are NOT DETECTED.  The SARS-CoV-2 RNA is generally detectable in upper and lower respiratory specimens during the acute phase of infection. The lowest concentration of SARS-CoV-2 viral copies this assay can detect is 250 copies / mL. A negative result does not preclude SARS-CoV-2 infection and should not be used as the sole basis for treatment or other patient management decisions.  A negative result may occur with improper specimen collection / handling, submission of specimen other than nasopharyngeal swab, presence of viral mutation(s) within the areas targeted by this assay, and inadequate number of viral copies (<250 copies / mL). A negative result must be combined with clinical observations, patient history, and epidemiological information.  Fact Sheet for Patients:   [facility]  Fact Sheet for Healthcare Providers: 10/06/19  This test is not yet approved or  cleared by the [employer] FDA and has been authorized for detection and/or diagnosis of SARS-CoV-2 by FDA under an Emergency Use Authorization (EUA).  This EUA will remain in effect (meaning this test can be used) for the duration of the COVID-19 declaration under Section 564(b)(1) of the Act, 21 U.S.C. section 360bbb-3(b)(1), unless the authorization is terminated or revoked sooner.  Performed at [facility], [address] [place], [place] [address]    Studies/Results: [place] Abd 1 View  Result Date: 10/08/2019 CLINICAL DATA:  Nausea and vomiting. EXAM: ABDOMEN - 1 VIEW COMPARISON:  Radiograph 08/18/2019 and CT scan 10/04/2019 FINDINGS: The stomach is distended and filled with air and debris. Possible gastric outlet obstruction or gastroparesis. No findings suspicious for small bowel obstruction or free air. IMPRESSION:  Distended stomach. Possible gastric outlet obstruction or gastroparesis. Electronically Signed   By: Rudie Meyer M.D.   On: 10/08/2019 11:06   DG Abd Portable 1V  Result Date: 10/08/2019 CLINICAL DATA:  NG tube placement EXAM: PORTABLE ABDOMEN - 1 VIEW COMPARISON:  10/08/2019 FINDINGS: NG tube is located in the left lung, lower lobe. Recommend removal and replacement. Cardiomegaly. Bibasilar atelectasis. Mild vascular congestion. IMPRESSION: NG tube within the left lung in the left lower lobe. Recommend removal and replacement. Nurse has reportedly already removed the NG tube. Electronically Signed   By: Charlett Nose M.D.   On: 10/08/2019 17:50   DG Naso G Tube Plc W/Fl W/Rad  Result Date: 10/09/2019 CLINICAL DATA:  NG tube placement request. EXAM: NASO G TUBE PLACEMENT WITH FL AND WITH RAD CONTRAST:  None. FLUOROSCOPY TIME:  Fluoroscopy Time:  0 minutes 30 seconds. Radiation Exposure Index (if provided by the fluoroscopic device): 19.4 mGy Number of Acquired Spot Images: 3. COMPARISON:  Abdomen 10/08/2019 FINDINGS: NG tube was placed under fluoroscopic guidance with tip placed into the stomach. There no complications. The patient was extremely combative. IMPRESSION: Successful fluoroscopically directed NG tube placement. Electronically Signed   By: Maisie Fus  Register   On: 10/09/2019 08:51   Medications:  Scheduled Meds: . [MAR Hold] amoxicillin-clavulanate  1 tablet Oral Q12H  . [MAR Hold] enoxaparin (LOVENOX) injection  40 mg Subcutaneous Q24H  . [MAR Hold] feeding supplement  1 Container Oral Q breakfast  . [MAR Hold] feeding supplement (ENSURE ENLIVE)  237 mL Oral BID BM  . [MAR Hold] levETIRAcetam  1,500 mg Oral BID  . [MAR Hold] loratadine  10 mg Oral Daily  . [MAR Hold] metoCLOPramide  5 mg Oral TID AC & HS  . [MAR Hold] multivitamin with minerals  1 tablet Oral Daily  . [MAR Hold] pantoprazole sodium  40 mg Per Tube BID  . [MAR Hold] phenobarbital  48.6 mg Oral TID  . [MAR Hold]  phenytoin  300 mg Oral QHS  . [MAR Hold] sodium chloride flush  10-40 mL Intracatheter Q12H   Continuous Infusions: . sodium chloride    . dextrose 5% lactated ringers 50 mL/hr at 10/09/19 0945   PRN Meds:.[MAR Hold] acetaminophen **OR** [MAR Hold] acetaminophen, [MAR Hold] ondansetron **OR** [MAR Hold] ondansetron (ZOFRAN) IV, [MAR Hold] sodium chloride flush   Assessment: Principal Problem:   Intractable nausea and vomiting Active Problems:   Seizure disorder (HCC)   UTI (urinary tract infection)   Intellectual disability   Pressure injury of skin   Dehydration   Class 3 obesity    Plan: Abdominal exam is improved and less distended today  Patient was brought down to the Endo unit for her EGD today.  However, anesthesia was concerned that the NG was only placed this morning and therefore with the presence of food in her stomach, they would have to intubate the patient (NG suction has not been started yet and therefore no decompression has been performed yet)  After discussion with them, Dr. Consuella Lose, he states intubation will not have to be done  if EGD is done tomorrow instead, after NG suction today  Therefore, the best step in the setting would be to postpone the EGD tomorrow to allow for suctioning of the stomach today  However, I have discussed with primary hospitalist, Dr. Nelson Chimes, to obtain abdominal imaging today, ideally, after about 1 to 2 hours NG to suction  As stated in consult note yesterday, due to her symptoms, surgery consult is recommended, especially if imaging continues to show distention   LOS: 4 days   Melodie Bouillon, MD 10/09/2019, 11:21 AM

## 2019-10-09 NOTE — Progress Notes (Signed)
PROGRESS NOTE    Mariah EhlersBarbara Jimenez  ZOX:[id number] DOB: [birth year] DOA: 10/04/2019 [place]: [facility] [place]   Brief Narrative:  74 year old female with past medical history for intellectual disability and seizures. She reported 3 days of nausea and vomiting, not able to tolerate p.o. Outpatient evaluation with abdominal x-ray showed possible small bowel obstruction she was sent to the hospital for further evaluation.  CT of the abdomen with findings consistent with cystitis, small amount of pericholecystic fluid and slightly pericholecystic haziness, no gallbladder thickening or stones, questionable early cholecystitis. Chest radiograph, hypopenetration, possible bilateral small pleural effusions, hilar vascular congestion. Right upper quadrant ultrasound with normal gallbladder. She was able to tolerate p.o. yesterday and was discharge to her facility.  Apparently patient developed projectile vomiting before arrival of EMS, resulted in cancellation of discharge. Repeat KUB today with distended abdomen, no small bowel obstruction. GI was consulted for a possible gastric outlet obstruction.  Subjective: Patient was seen and examined after placement of NG tube with fluoroscopy.  Copious amount of secretions.  No new complaints.  Sister was in the room.  Assessment & Plan:   Principal Problem:   Intractable nausea and vomiting Active Problems:   Seizure disorder (HCC)   UTI (urinary tract infection)   Intellectual disability   Pressure injury of skin   Dehydration   Class 3 obesity  Vomiting/gastric outlet obstruction/gastroparesis.  Multiple attempts for NG tube placements were failed yesterday as patient was agitated and NG tube was keep entering lungs.  Today it was placed with fluoroscopic guided by IR. Repeat abdominal x-ray did not comment on gastric distention.  Patient had a projectile vomiting yesterday and having copious amount of gastric secretions after  placing NG tube today. She was taken for EGD today but there was some concern of high risk for aspiration as NG tube was placed this morning by anesthesia so it get postponed till tomorrow. GI is asking for surgical consult too. -Keep her n.p.o. - EGD tomorrow. -Continue Protonix IV. -Start her on Reglan. -Continue IV fluid. -Surgical consult.  UTI.  Received one-time dose of fosfomycin and then started on ceftriaxone for the concern of complicated UTI. Urine cultures with multiple organisms. -She completed the course of antibiotics now.  Seizure disorder.  No acute concern. -Continue home dose of Keppra, phenobarbital and phenytoin.  Cognitive impairment/swallow dysfunction.  Patient is bedridden, nonverbal and completely dependent for all her ADLs for the past 2 years. She was evaluated by speech therapy and they were recommending dysphagia 1 diet. -Currently n.p.o.  Stage II sacral ulcer, present on admission.  - Continue local wound care.  Hypokalemia.  Resolved with repletion. -Continue to monitor. -Repleat as needed  Obesity class 3.  BMI is 45. Challenging treatment due to cognitive impairment. -Can complicate overall prognosis.  Objective: Vitals:   10/08/19 2338 10/09/19 0030 10/09/19 0348 10/09/19 1033  BP: (!) 151/84 134/70 (!) 153/56 128/68  Pulse: (!) 113 (!) 101 (!) 104 94  Resp: 18 18 18    Temp: (!) 97.5 F (36.4 C) (!) 97.5 F (36.4 C) (!) 97.5 F (36.4 C) (!) 97 F (36.1 C)  TempSrc: Oral Oral Oral Temporal  SpO2: 96% 100% 98% 100%  Weight:    61.4 kg  Height:    5\' 3"  (1.6 m)    Intake/Output Summary (Last 24 hours) at 10/09/2019 1545 Last data filed at 10/09/2019 1457 Gross per 24 hour  Intake 1585.53 ml  Output 1200 ml  Net 385.53 ml  Filed Weights   10/04/19 1239 10/09/19 1033  Weight: 135 kg 61.4 kg    Examination:  General exam: Appears calm and comfortable.  Nonverbal.  NG tube in place. Respiratory system: Clear to auscultation.  Respiratory effort normal. Cardiovascular system: S1 & S2 heard, RRR. No JVD, murmurs, Gastrointestinal system: Soft, nontender, nondistended, bowel sounds positive. Central nervous system: Alert, nonverbal and unable to participate with exam. Extremities: No edema, no cyanosis, pulses intact and symmetrical. Psychiatry: Judgement and insight appear impaired.  DVT prophylaxis: Lovenox Code Status: DNR Family Communication: Sister was updated at bedside Disposition Plan:  Status is: Inpatient  Remains inpatient appropriate because:Inpatient level of care appropriate due to severity of illness   Dispo: The patient is from: SNF              Anticipated d/c is to: SNF              Anticipated d/c date is: 2 days              Patient currently is not medically stable to d/c.  Patient is going for EGD tomorrow.  Consultants:   GI  Surgery  Procedures:  Antimicrobials:   Data Reviewed: I have personally reviewed following labs and imaging studies  CBC: Recent Labs  Lab 10/04/19 1244 10/09/19 0503  WBC 9.2 7.0  HGB 11.4* 10.8*  HCT 35.1* 33.1*  MCV 84.6 83.0  PLT 365 353   Basic Metabolic Panel: Recent Labs  Lab 10/04/19 1244 10/05/19 0425 10/07/19 0421 10/09/19 0503  NA 133* 139 141 142  K 3.4* 3.4* 3.6 3.5  CL 97* 104 108 108  CO2 23 21* 23 24  GLUCOSE 99 103* 104* 99  BUN 31* 25* 16 8  CREATININE 0.79 0.74 0.61 0.54  CALCIUM 8.7* 8.4* 8.4* 8.7*  MG  --   --  2.1 1.8   GFR: Estimated Creatinine Clearance: 52.6 mL/min (by C-G formula based on SCr of 0.54 mg/dL). Liver Function Tests: Recent Labs  Lab 10/04/19 1244  AST 24  ALT 21  ALKPHOS 203*  BILITOT 1.2  PROT 8.0  ALBUMIN 2.6*   Recent Labs  Lab 10/04/19 1244  LIPASE 55*   No results for input(s): AMMONIA in the last 168 hours. Coagulation Profile: No results for input(s): INR, PROTIME in the last 168 hours. Cardiac Enzymes: No results for input(s): CKTOTAL, CKMB, CKMBINDEX, TROPONINI in  the last 168 hours. BNP (last 3 results) No results for input(s): PROBNP in the last 8760 hours. HbA1C: No results for input(s): HGBA1C in the last 72 hours. CBG: No results for input(s): GLUCAP in the last 168 hours. Lipid Profile: No results for input(s): CHOL, HDL, LDLCALC, TRIG, CHOLHDL, LDLDIRECT in the last 72 hours. Thyroid Function Tests: No results for input(s): TSH, T4TOTAL, FREET4, T3FREE, THYROIDAB in the last 72 hours. Anemia Panel: No results for input(s): VITAMINB12, FOLATE, FERRITIN, TIBC, IRON, RETICCTPCT in the last 72 hours. Sepsis Labs: Recent Labs  Lab 10/04/19 1244  LATICACIDVEN 1.0    Recent Results (from the past 240 hour(s))  Urine culture     Status: Abnormal   Collection Time: 10/04/19 12:44 PM   Specimen: Urine, Random  Result Value Ref Range Status   Specimen Description   Final    URINE, RANDOM Performed at [facility], [address], [place], [place] [address]    Special Requests   Final    NONE Performed at [facility], [address] [place], [place] [address]  Culture (A)  Final    >=100,000 COLONIES/mL MULTIPLE SPECIES PRESENT, SUGGEST RECOLLECTION   Report Status 10/06/2019 FINAL  Final  SARS Coronavirus 2 by RT PCR (hospital order, performed in [facility] hospital lab) Nasopharyngeal Nasopharyngeal Swab     Status: None   Collection Time: 10/04/19  8:41 PM   Specimen: Nasopharyngeal Swab  Result Value Ref Range Status   SARS Coronavirus 2 NEGATIVE NEGATIVE Final    Comment: (NOTE) SARS-CoV-2 target nucleic acids are NOT DETECTED.  The SARS-CoV-2 RNA is generally detectable in upper and lower respiratory specimens during the acute phase of infection. The lowest concentration of SARS-CoV-2 viral copies this assay can detect is 250 copies / mL. A negative result does not preclude SARS-CoV-2 infection and should not be used as the sole basis for treatment or other patient management decisions.  A negative  result may occur with improper specimen collection / handling, submission of specimen other than nasopharyngeal swab, presence of viral mutation(s) within the areas targeted by this assay, and inadequate number of viral copies (<250 copies / mL). A negative result must be combined with clinical observations, patient history, and epidemiological information.  Fact Sheet for Patients:   [employer]  Fact Sheet for Healthcare Providers: https://pope.com/  This test is not yet approved or  cleared by the [place] FDA and has been authorized for detection and/or diagnosis of SARS-CoV-2 by FDA under an Emergency Use Authorization (EUA).  This EUA will remain in effect (meaning this test can be used) for the duration of the COVID-19 declaration under Section 564(b)(1) of the Act, 21 U.S.C. section 360bbb-3(b)(1), unless the authorization is terminated or revoked sooner.  Performed at [facility], [address], [place], [place] [address]      Radiology Studies: DG Abd 1 View  Result Date: 10/09/2019 CLINICAL DATA:  NG tube EXAM: ABDOMEN - 1 VIEW COMPARISON:  10/08/2019, 10/09/2019 FINDINGS: Esophageal tube is looped within the stomach with the tip projecting over the gastric body. Nonobstructed gas pattern. IMPRESSION: Esophageal tube tip overlies the gastric body. Electronically Signed   By: Jasmine Pang M.D.   On: 10/09/2019 15:40   DG Abd 1 View  Result Date: 10/08/2019 CLINICAL DATA:  Nausea and vomiting. EXAM: ABDOMEN - 1 VIEW COMPARISON:  Radiograph 08/18/2019 and CT scan 10/04/2019 FINDINGS: The stomach is distended and filled with air and debris. Possible gastric outlet obstruction or gastroparesis. No findings suspicious for small bowel obstruction or free air. IMPRESSION: Distended stomach. Possible gastric outlet obstruction or gastroparesis. Electronically Signed   By: Rudie Meyer M.D.   On: 10/08/2019  11:06   DG Abd Portable 1V  Result Date: 10/08/2019 CLINICAL DATA:  NG tube placement EXAM: PORTABLE ABDOMEN - 1 VIEW COMPARISON:  10/08/2019 FINDINGS: NG tube is located in the left lung, lower lobe. Recommend removal and replacement. Cardiomegaly. Bibasilar atelectasis. Mild vascular congestion. IMPRESSION: NG tube within the left lung in the left lower lobe. Recommend removal and replacement. Nurse has reportedly already removed the NG tube. Electronically Signed   By: Charlett Nose M.D.   On: 10/08/2019 17:50   DG Naso G Tube Plc W/Fl W/Rad  Result Date: 10/09/2019 CLINICAL DATA:  NG tube placement request. EXAM: NASO G TUBE PLACEMENT WITH FL AND WITH RAD CONTRAST:  None. FLUOROSCOPY TIME:  Fluoroscopy Time:  0 minutes 30 seconds. Radiation Exposure Index (if provided by the fluoroscopic device): 19.4 mGy Number of Acquired Spot Images: 3. COMPARISON:  Abdomen 10/08/2019 FINDINGS: NG tube was  placed under fluoroscopic guidance with tip placed into the stomach. There no complications. The patient was extremely combative. IMPRESSION: Successful fluoroscopically directed NG tube placement. Electronically Signed   By: Maisie Fus  Register   On: 10/09/2019 08:51    Scheduled Meds: . enoxaparin (LOVENOX) injection  40 mg Subcutaneous Q24H  . feeding supplement  1 Container Oral Q breakfast  . feeding supplement (ENSURE ENLIVE)  237 mL Oral BID BM  . levETIRAcetam  1,500 mg Oral BID  . loratadine  10 mg Oral Daily  . metoCLOPramide  5 mg Oral TID AC & HS  . multivitamin with minerals  1 tablet Oral Daily  . pantoprazole sodium  40 mg Per Tube BID  . phenobarbital  48.6 mg Oral TID  . phenytoin  300 mg Oral QHS  . sodium chloride flush  10-40 mL Intracatheter Q12H   Continuous Infusions: . dextrose 5% lactated ringers 50 mL/hr at 10/09/19 0945     LOS: 4 days   Time spent: 40 minutes.  Arnetha Courser, MD [facility]  If 7PM-7AM, please contact  night-coverage [email or web address]  10/09/2019, 3:45 PM   This record has been created using Conservation officer, historic buildings. Errors have been sought and corrected,but may not always be located. Such creation errors do not reflect on the standard of care.

## 2019-10-09 NOTE — Progress Notes (Signed)
Patient returning to her room per Dr. Maximino Greenland to get a xray today and to stay NPO till the results are read and to determine if she needs a surgical consult or the EGD. The EGD will be rescheduled for tomorrow.

## 2019-10-09 NOTE — Anesthesia Preprocedure Evaluation (Deleted)
Anesthesia Evaluation  Patient identified by MRN, date of birth, ID band Patient awake    Reviewed: Allergy & Precautions, H&P , NPO status , Patient's Chart, lab work & pertinent test results, reviewed documented beta blocker date and time   History of Anesthesia Complications Negative for: history of anesthetic complications  Airway   TM Distance: >3 FB Neck ROM: full    Dental no notable dental hx. (+) Edentulous Upper, Edentulous Lower   Pulmonary neg pulmonary ROS,    Pulmonary exam normal breath sounds clear to auscultation       Cardiovascular Exercise Tolerance: Good negative cardio ROS Normal cardiovascular exam Rhythm:regular Rate:Normal     Neuro/Psych Seizures -, Well Controlled,  PSYCHIATRIC DISORDERS (Mental retardation)    GI/Hepatic negative GI ROS, Neg liver ROS,   Endo/Other  neg diabetesMorbid obesity  Renal/GU negative Renal ROS  negative genitourinary   Musculoskeletal   Abdominal   Peds  Hematology negative hematology ROS (+)   Anesthesia Other Findings Past Medical History: No date: Mental retardation No date: Seizures (HCC)   Reproductive/Obstetrics negative OB ROS                             Anesthesia Physical Anesthesia Plan  ASA: III  Anesthesia Plan: General   Post-op Pain Management:    Induction: Intravenous, Rapid sequence and Cricoid pressure planned  PONV Risk Score and Plan: Ondansetron and Dexamethasone  Airway Management Planned: Oral ETT  Additional Equipment:   Intra-op Plan:   Post-operative Plan: Extubation in OR  Informed Consent: I have reviewed the patients History and Physical, chart, labs and discussed the procedure including the risks, benefits and alternatives for the proposed anesthesia with the patient or authorized representative who has indicated his/her understanding and acceptance.     Dental Advisory Given  Plan  Discussed with: Anesthesiologist, CRNA and Surgeon  Anesthesia Plan Comments:         Anesthesia Quick Evaluation

## 2019-10-09 NOTE — Progress Notes (Signed)
OT Cancellation Note  Patient Details Name: Mariah Jimenez MRN: [id number] DOB: 01/11/1946   Cancelled Treatment:    Reason Eval/Treat Not Completed: Patient at procedure or test/ unavailable. Per [facility], pt off floor for NG tube placement. Will hold OT tx this date and follow up with self-feeding goals as pt appropriate for p.o. / pleasure feeding.   Kathie Dike, M.S. OTR/L  10/09/19, 11:15 AM

## 2019-10-10 ENCOUNTER — Inpatient Hospital Stay: Payer: Medicare Other | Admitting: Anesthesiology

## 2019-10-10 ENCOUNTER — Encounter: Payer: Self-pay | Admitting: Internal Medicine

## 2019-10-10 ENCOUNTER — Encounter
Admission: EM | Disposition: A | Discharge status: Skilled Nursing Facility | Payer: Self-pay | Source: Home / Self Care | Attending: Internal Medicine

## 2019-10-10 DIAGNOSIS — K317 Polyp of stomach and duodenum: Secondary | ICD-10-CM

## 2019-10-10 DIAGNOSIS — K208 Other esophagitis without bleeding: Secondary | ICD-10-CM | POA: Diagnosis not present

## 2019-10-10 DIAGNOSIS — K2960 Other gastritis without bleeding: Secondary | ICD-10-CM | POA: Diagnosis not present

## 2019-10-10 DIAGNOSIS — R112 Nausea with vomiting, unspecified: Secondary | ICD-10-CM | POA: Diagnosis not present

## 2019-10-10 DIAGNOSIS — E860 Dehydration: Secondary | ICD-10-CM | POA: Diagnosis not present

## 2019-10-10 HISTORY — PX: ESOPHAGOGASTRODUODENOSCOPY (EGD) WITH PROPOFOL: SHX5813

## 2019-10-10 LAB — BASIC METABOLIC PANEL
Anion gap: 9 (ref 5–15)
BUN: 7 mg/dL — ABNORMAL LOW (ref 8–23)
CO2: 26 mmol/L (ref 22–32)
Calcium: 8.8 mg/dL — ABNORMAL LOW (ref 8.9–10.3)
Chloride: 109 mmol/L (ref 98–111)
Creatinine, Ser: 0.61 mg/dL (ref 0.44–1.00)
GFR calc Af Amer: >60 mL/min (ref >60)
GFR calc non Af Amer: >60 mL/min (ref >60)
Glucose, Bld: 115 mg/dL — ABNORMAL HIGH (ref 70–99)
Potassium: 3.5 mmol/L (ref 3.5–5.1)
Sodium: 144 mmol/L (ref 135–145)

## 2019-10-10 LAB — MAGNESIUM: Magnesium: 1.6 mg/dL — ABNORMAL LOW (ref 1.7–2.4)

## 2019-10-10 SURGERY — ESOPHAGOGASTRODUODENOSCOPY (EGD) WITH PROPOFOL
Anesthesia: General

## 2019-10-10 MED ORDER — FENTANYL CITRATE (PF) 100 MCG/2ML IJ SOLN: 25.0000 ug | INTRAMUSCULAR | Status: DC | PRN

## 2019-10-10 MED ORDER — PROPOFOL 10 MG/ML IV BOLUS
INTRAVENOUS | Status: DC | PRN
  Administered 2019-10-10: 150 mg via INTRAVENOUS

## 2019-10-10 MED ORDER — ONDANSETRON HCL 4 MG/2ML IJ SOLN: 4.0000 mg | Freq: Once | INTRAMUSCULAR | Status: DC | PRN

## 2019-10-10 MED ORDER — SODIUM CHLORIDE 0.9 % IV SOLN: INTRAVENOUS | Status: DC | PRN

## 2019-10-10 MED ORDER — SUCCINYLCHOLINE CHLORIDE 20 MG/ML IJ SOLN
INTRAMUSCULAR | Status: DC | PRN
  Administered 2019-10-10: 140 mg via INTRAVENOUS

## 2019-10-10 MED ORDER — PROPOFOL 10 MG/ML IV BOLUS
INTRAVENOUS | Status: AC
  Filled 2019-10-10: qty 20

## 2019-10-10 MED ORDER — ONDANSETRON HCL 4 MG/2ML IJ SOLN
INTRAMUSCULAR | Status: AC
  Filled 2019-10-10: qty 2

## 2019-10-10 MED ORDER — SUCCINYLCHOLINE CHLORIDE 200 MG/10ML IV SOSY
PREFILLED_SYRINGE | INTRAVENOUS | Status: AC
  Filled 2019-10-10: qty 10

## 2019-10-10 MED ORDER — FENTANYL CITRATE (PF) 100 MCG/2ML IJ SOLN
INTRAMUSCULAR | Status: DC | PRN
  Administered 2019-10-10: 50 ug via INTRAVENOUS
  Administered 2019-10-10: 25 ug via INTRAVENOUS

## 2019-10-10 MED ORDER — PHENYLEPHRINE HCL (PRESSORS) 10 MG/ML IV SOLN
INTRAVENOUS | Status: DC | PRN
  Administered 2019-10-10 (×2): 100 ug via INTRAVENOUS

## 2019-10-10 MED ORDER — SODIUM CHLORIDE 0.9 % IV SOLN: INTRAVENOUS | Status: DC

## 2019-10-10 MED ORDER — BOOST / RESOURCE BREEZE PO LIQD CUSTOM
1.0000 | Freq: Three times a day (TID) | ORAL | Status: DC
  Administered 2019-10-10 – 2019-10-15 (×12): 1 via ORAL

## 2019-10-10 MED ORDER — MAGNESIUM SULFATE 2 GM/50ML IV SOLN
2.0000 g | Freq: Once | INTRAVENOUS | Status: AC
  Administered 2019-10-10: 10:00:00 2 g via INTRAVENOUS
  Filled 2019-10-10: qty 50

## 2019-10-10 MED ORDER — PROPOFOL 500 MG/50ML IV EMUL
INTRAVENOUS | Status: DC | PRN
Start: 2019-10-10 — End: 2019-10-10
  Administered 2019-10-10: 100 ug/kg/min via INTRAVENOUS

## 2019-10-10 MED ORDER — LIDOCAINE HCL (CARDIAC) PF 100 MG/5ML IV SOSY
PREFILLED_SYRINGE | INTRAVENOUS | Status: DC | PRN
  Administered 2019-10-10: 80 mg via INTRAVENOUS

## 2019-10-10 MED ORDER — LIDOCAINE HCL (PF) 2 % IJ SOLN
INTRAMUSCULAR | Status: AC
  Filled 2019-10-10: qty 5

## 2019-10-10 MED ORDER — FENTANYL CITRATE (PF) 100 MCG/2ML IJ SOLN
INTRAMUSCULAR | Status: AC
  Filled 2019-10-10: qty 2

## 2019-10-10 NOTE — Anesthesia Preprocedure Evaluation (Addendum)
Anesthesia Evaluation  Patient identified by MRN, date of birth, ID band Patient awake    Reviewed: Allergy & Precautions, H&P , NPO status , Patient's Chart, lab work & pertinent test results, reviewed documented beta blocker date and time   History of Anesthesia Complications Negative for: history of anesthetic complications  Airway   TM Distance: >3 FB Neck ROM: full   Comment: Patient unwilling to comply with request to open her mouth.  Patient is severely mentally disabled. Dental no notable dental hx. (+) Edentulous Upper, Edentulous Lower   Pulmonary neg pulmonary ROS,    Pulmonary exam normal breath sounds clear to auscultation       Cardiovascular Exercise Tolerance: Good negative cardio ROS Normal cardiovascular exam Rhythm:regular Rate:Normal     Neuro/Psych Seizures -, Well Controlled,  PSYCHIATRIC DISORDERS (Mental retardation)    GI/Hepatic negative GI ROS, Neg liver ROS,   Endo/Other  neg diabetesMorbid obesity  Renal/GU negative Renal ROS  negative genitourinary   Musculoskeletal   Abdominal   Peds  Hematology negative hematology ROS (+)   Anesthesia Other Findings Past Medical History: No date: Mental retardation No date: Seizures (HCC)   Reproductive/Obstetrics negative OB ROS                            Anesthesia Physical  Anesthesia Plan  ASA: III  Anesthesia Plan: General   Post-op Pain Management:    Induction: Intravenous, Rapid sequence and Cricoid pressure planned  PONV Risk Score and Plan: Ondansetron, Dexamethasone, Propofol infusion and TIVA  Airway Management Planned: Oral ETT  Additional Equipment:   Intra-op Plan:   Post-operative Plan: Extubation in OR  Informed Consent: I have reviewed the patients History and Physical, chart, labs and discussed the procedure including the risks, benefits and alternatives for the proposed anesthesia with  the patient or authorized representative who has indicated his/her understanding and acceptance.     Dental Advisory Given  Plan Discussed with: Anesthesiologist, CRNA and Surgeon  Anesthesia Plan Comments:         Anesthesia Quick Evaluation                                  Anesthesia Evaluation  Patient identified by MRN, date of birth, ID band Patient awake    Reviewed: Allergy & Precautions, H&P , NPO status , Patient's Chart, lab work & pertinent test results, reviewed documented beta blocker date and time   History of Anesthesia Complications Negative for: history of anesthetic complications  Airway   TM Distance: >3 FB Neck ROM: full    Dental no notable dental hx. (+) Edentulous Upper, Edentulous Lower   Pulmonary neg pulmonary ROS,    Pulmonary exam normal breath sounds clear to auscultation       Cardiovascular Exercise Tolerance: Good negative cardio ROS Normal cardiovascular exam Rhythm:regular Rate:Normal     Neuro/Psych Seizures -, Well Controlled,  PSYCHIATRIC DISORDERS (Mental retardation)    GI/Hepatic negative GI ROS, Neg liver ROS,   Endo/Other  neg diabetesMorbid obesity  Renal/GU negative Renal ROS  negative genitourinary   Musculoskeletal   Abdominal   Peds  Hematology negative hematology ROS (+)   Anesthesia Other Findings Past Medical History: No date: Mental retardation No date: Seizures (HCC)   Reproductive/Obstetrics negative OB ROS  Anesthesia Physical Anesthesia Plan  ASA: III  Anesthesia Plan: General   Post-op Pain Management:    Induction: Intravenous, Rapid sequence and Cricoid pressure planned  PONV Risk Score and Plan: Ondansetron and Dexamethasone  Airway Management Planned: Oral ETT  Additional Equipment:   Intra-op Plan:   Post-operative Plan: Extubation in OR  Informed Consent: I have reviewed the patients History and  Physical, chart, labs and discussed the procedure including the risks, benefits and alternatives for the proposed anesthesia with the patient or authorized representative who has indicated his/her understanding and acceptance.     Dental Advisory Given  Plan Discussed with: Anesthesiologist, CRNA and Surgeon  Anesthesia Plan Comments:         Anesthesia Quick Evaluation

## 2019-10-10 NOTE — Progress Notes (Addendum)
PROGRESS NOTE    Mariah Jimenez  WUJ:[id number] DOB: 1945-09-10 DOA: 10/04/2019 PCP: Randell Loop, [facility]   Brief Narrative:  74 year old female with past medical history for intellectual disability and seizures. She reported 3 days of nausea and vomiting, not able to tolerate p.o. Outpatient evaluation with abdominal x-ray showed possible small bowel obstruction she was sent to the hospital for further evaluation.  CT of the abdomen with findings consistent with cystitis, small amount of pericholecystic fluid and slightly pericholecystic haziness, no gallbladder thickening or stones, questionable early cholecystitis. Chest radiograph, hypopenetration, possible bilateral small pleural effusions, hilar vascular congestion. Right upper quadrant ultrasound with normal gallbladder. She was able to tolerate p.o. yesterday and was discharge to her facility.  Apparently patient developed projectile vomiting before arrival of EMS, resulted in cancellation of discharge. Repeat KUB today with distended abdomen, no small bowel obstruction. GI was consulted for a possible gastric outlet obstruction.  Subjective: Patient was seen after EGD.  Sister was in the room.  She remained nonverbal and unable to explain any symptoms.  Assessment & Plan:   Principal Problem:   Intractable nausea and vomiting Active Problems:   Seizure disorder (HCC)   UTI (urinary tract infection)   Intellectual disability   Pressure injury of skin   Dehydration   Class 3 obesity   Gastric polyp  Vomiting/gastric outlet obstruction/gastroparesis.  Her imaging was concerning for gastric outlet obstruction.  NG tube was placed and fluoroscopy unit after multiple failed attempts.  NG tube drained copious amount of secretions.  GI was also consulted and she was taken for EGD today which was negative for any outlet obstruction.  Did show some white patches on esophagus and biopsies were taken.  There was  some element of gastritis. GI also requested a surgical consult-Dr. Tonna Boehringer saw the patient and do not think that she needs any surgical intervention at this time. Her NG tube came out during EGD today.  As there was no sign of obstruction we will keep it out and start her on clear liquids.  Diet can be advanced slowly as tolerated. Might be some element of gastroparesis. -Clear liquid diet -Continue Protonix IV. -Continue Reglan. -Continue IV fluid.  UTI.  Received one-time dose of fosfomycin and then started on ceftriaxone for the concern of complicated UTI. Urine cultures with multiple organisms. -She completed the course of antibiotics now.  Seizure disorder.  No acute concern. -Continue home dose of Keppra, phenobarbital and phenytoin.  Cognitive impairment/swallow dysfunction.  Patient is bedridden, nonverbal and completely dependent for all her ADLs for the past 2 years. She was evaluated by speech therapy and they were recommending dysphagia 1 diet.  Stage II sacral ulcer, present on admission.  - Continue local wound care.  Hypokalemia.  Resolved with repletion.  Some hypomagnesemia which was repleted today. -Continue to monitor. -Repleat as needed  Obesity class 3.  BMI is 45. Challenging treatment due to cognitive impairment. -Can complicate overall prognosis.  Objective: Vitals:   10/10/19 0954 10/10/19 1212 10/10/19 1227 10/10/19 1242  BP: 128/66 (!) 105/49 112/64 115/68  Pulse: 96 (!) 105 (!) 101 (!) 102  Resp: 18 18 18 18   Temp: (!) 97.3 F (36.3 C) (!) 97.1 F (36.2 C)  (!) 97.4 F (36.3 C)  TempSrc: Temporal     SpO2: 99% 99% 100% 97%  Weight:      Height: 5\' 3"  (1.6 m)       Intake/Output Summary (Last 24 hours) at 10/10/2019 1437  Last data filed at 10/10/2019 1213 Gross per 24 hour  Intake 300 ml  Output 1050 ml  Net -750 ml   Filed Weights   10/04/19 1239 10/09/19 1033  Weight: 135 kg 61.4 kg    Examination:  General exam: Appears calm and  comfortable.  Nonverbal.   Respiratory system: Clear to auscultation. Respiratory effort normal. Cardiovascular system: S1 & S2 heard, RRR. No JVD, murmurs, Gastrointestinal system: Soft, nontender, nondistended, bowel sounds positive. Central nervous system: Alert, nonverbal and unable to participate with exam. Extremities: No edema, no cyanosis, pulses intact and symmetrical. Psychiatry: Judgement and insight appear impaired.  DVT prophylaxis: Lovenox Code Status: DNR Family Communication: Sister was updated at bedside Disposition Plan:  Status is: Inpatient  Remains inpatient appropriate because:Inpatient level of care appropriate due to severity of illness   Dispo: The patient is from: SNF              Anticipated d/c is to: SNF              Anticipated d/c date is: 1 day.              Patient currently is not medically stable to d/c.  Patient is being started on clear liquid diet.  If she was able to tolerate advancement in diet by tomorrow then she should be able to go back to SNF.  Consultants:   GI  Surgery  Procedures:  Antimicrobials:   Data Reviewed: I have personally reviewed following labs and imaging studies  CBC: Recent Labs  Lab 10/04/19 1244 10/09/19 0503  WBC 9.2 7.0  HGB 11.4* 10.8*  HCT 35.1* 33.1*  MCV 84.6 83.0  PLT 365 353   Basic Metabolic Panel: Recent Labs  Lab 10/04/19 1244 10/05/19 0425 10/07/19 0421 10/09/19 0503 10/10/19 0519  NA 133* 139 141 142 144  K 3.4* 3.4* 3.6 3.5 3.5  CL 97* 104 108 108 109  CO2 23 21* 23 24 26   GLUCOSE 99 103* 104* 99 115*  BUN 31* 25* 16 8 7*  CREATININE 0.79 0.74 0.61 0.54 0.61  CALCIUM 8.7* 8.4* 8.4* 8.7* 8.8*  MG  --   --  2.1 1.8 1.6*   GFR: Estimated Creatinine Clearance: 52.6 mL/min (by C-G formula based on SCr of 0.61 mg/dL). Liver Function Tests: Recent Labs  Lab 10/04/19 1244  AST 24  ALT 21  ALKPHOS 203*  BILITOT 1.2  PROT 8.0  ALBUMIN 2.6*   Recent Labs  Lab 10/04/19 1244    LIPASE 55*   No results for input(s): AMMONIA in the last 168 hours. Coagulation Profile: No results for input(s): INR, PROTIME in the last 168 hours. Cardiac Enzymes: No results for input(s): CKTOTAL, CKMB, CKMBINDEX, TROPONINI in the last 168 hours. BNP (last 3 results) No results for input(s): PROBNP in the last 8760 hours. HbA1C: No results for input(s): HGBA1C in the last 72 hours. CBG: No results for input(s): GLUCAP in the last 168 hours. Lipid Profile: No results for input(s): CHOL, HDL, LDLCALC, TRIG, CHOLHDL, LDLDIRECT in the last 72 hours. Thyroid Function Tests: No results for input(s): TSH, T4TOTAL, FREET4, T3FREE, THYROIDAB in the last 72 hours. Anemia Panel: No results for input(s): VITAMINB12, FOLATE, FERRITIN, TIBC, IRON, RETICCTPCT in the last 72 hours. Sepsis Labs: Recent Labs  Lab 10/04/19 1244  LATICACIDVEN 1.0    Recent Results (from the past 240 hour(s))  Urine culture     Status: Abnormal   Collection Time: 10/04/19 12:44 PM  Specimen: Urine, Random  Result Value Ref Range Status   Specimen Description   Final    URINE, RANDOM Performed at [facility], [address] [place], [place] [address]    Special Requests   Final    NONE Performed at [facility], [address] [place], [place] [address]    Culture (A)  Final    >=100,000 COLONIES/mL MULTIPLE SPECIES PRESENT, SUGGEST RECOLLECTION   Report Status 10/06/2019 FINAL  Final  SARS Coronavirus 2 by RT PCR (hospital order, performed in [facility] hospital lab) Nasopharyngeal Nasopharyngeal Swab     Status: None   Collection Time: 10/04/19  8:41 PM   Specimen: Nasopharyngeal Swab  Result Value Ref Range Status   SARS Coronavirus 2 NEGATIVE NEGATIVE Final    Comment: (NOTE) SARS-CoV-2 target nucleic acids are NOT DETECTED.  The SARS-CoV-2 RNA is generally detectable in upper and lower respiratory specimens during the acute phase of infection. The  lowest concentration of SARS-CoV-2 viral copies this assay can detect is 250 copies / mL. A negative result does not preclude SARS-CoV-2 infection and should not be used as the sole basis for treatment or other patient management decisions.  A negative result may occur with improper specimen collection / handling, submission of specimen other than nasopharyngeal swab, presence of viral mutation(s) within the areas targeted by this assay, and inadequate number of viral copies (<250 copies / mL). A negative result must be combined with clinical observations, patient history, and epidemiological information.  Fact Sheet for Patients:   BoilerBrush.com.cy  Fact Sheet for Healthcare Providers: https://pope.com/  This test is not yet approved or  cleared by the [place] FDA and has been authorized for detection and/or diagnosis of SARS-CoV-2 by FDA under an Emergency Use Authorization (EUA).  This EUA will remain in effect (meaning this test can be used) for the duration of the COVID-19 declaration under Section 564(b)(1) of the Act, 21 U.S.C. section 360bbb-3(b)(1), unless the authorization is terminated or revoked sooner.  Performed at [facility], [address], [place], [place] [address]      Radiology Studies: DG Abd 1 View  Result Date: 10/09/2019 CLINICAL DATA:  NG tube EXAM: ABDOMEN - 1 VIEW COMPARISON:  10/08/2019, 10/09/2019 FINDINGS: Esophageal tube is looped within the stomach with the tip projecting over the gastric body. Nonobstructed gas pattern. IMPRESSION: Esophageal tube tip overlies the gastric body. Electronically Signed   By: Jasmine Pang M.D.   On: 10/09/2019 15:40   DG Abd Portable 1V  Result Date: 10/08/2019 CLINICAL DATA:  NG tube placement EXAM: PORTABLE ABDOMEN - 1 VIEW COMPARISON:  10/08/2019 FINDINGS: NG tube is located in the left lung, lower lobe. Recommend removal and replacement.  Cardiomegaly. Bibasilar atelectasis. Mild vascular congestion. IMPRESSION: NG tube within the left lung in the left lower lobe. Recommend removal and replacement. Nurse has reportedly already removed the NG tube. Electronically Signed   By: Charlett Nose M.D.   On: 10/08/2019 17:50   DG Naso G Tube Plc W/Fl W/Rad  Result Date: 10/09/2019 CLINICAL DATA:  NG tube placement request. EXAM: NASO G TUBE PLACEMENT WITH FL AND WITH RAD CONTRAST:  None. FLUOROSCOPY TIME:  Fluoroscopy Time:  0 minutes 30 seconds. Radiation Exposure Index (if provided by the fluoroscopic device): 19.4 mGy Number of Acquired Spot Images: 3. COMPARISON:  Abdomen 10/08/2019 FINDINGS: NG tube was placed under fluoroscopic guidance with tip placed into the stomach. There no complications. The patient was extremely combative. IMPRESSION:  Successful fluoroscopically directed NG tube placement. Electronically Signed   By: Maisie Fushomas  Register   On: 10/09/2019 08:51    Scheduled Meds: . enoxaparin (LOVENOX) injection  40 mg Subcutaneous Q24H  . feeding supplement  1 Container Oral Q breakfast  . feeding supplement (ENSURE ENLIVE)  237 mL Oral BID BM  . levETIRAcetam  1,500 mg Oral BID  . loratadine  10 mg Oral Daily  . metoCLOPramide  5 mg Oral TID AC & HS  . multivitamin with minerals  1 tablet Oral Daily  . pantoprazole sodium  40 mg Per Tube BID  . phenobarbital  48.6 mg Oral TID  . phenytoin  300 mg Oral QHS  . sodium chloride flush  10-40 mL Intracatheter Q12H   Continuous Infusions: . dextrose 5% lactated ringers 50 mL/hr at 10/10/19 1400     LOS: 5 days   Time spent: 35 minutes.  Arnetha CourserSumayya Kellyn Mccary, MD [facility]  If 7PM-7AM, please contact night-coverage [email or web address]  10/10/2019, 2:37 PM   This record has been created using Conservation officer, historic buildingsDragon voice recognition software. Errors have been sought and corrected,but may not always be located. Such creation errors do not reflect on the standard of care.

## 2019-10-10 NOTE — Consult Note (Addendum)
Subjective:   CC: Gastric outlet obstruction  HPI:  Mariah Jimenez is a 74 y.o. female who was consulted by [facility] for issue above.  Symptoms were first noted several weeks ago.  Intermittent nausea vomiting with UTI, thought she was improving with no obvious cause for the nausea vomiting when ready to be discharged for a UTI.  Redeveloped nausea and vomiting and plain films noted possible gastric outlet obstruction.  History obtained from chart review, secondary to patient's developmental delay, and inability to answer my questions.    Past Medical History:  has a past medical history of Mental retardation and Seizures (HCC).  Past Surgical History:  Past Surgical History:  Procedure Laterality Date  . NO PAST SURGERIES    . TIBIA IM NAIL INSERTION Right 04/09/2016   Procedure: INTRAMEDULLARY (IM) NAIL TIBIAL;  Surgeon: Christena Flake, MD;  Location: [facility] ORS;  Service: Orthopedics;  Laterality: Right;    Family History: Family history is unknown by patient.  Social History:  reports that she has never smoked. She has never used smokeless tobacco. She reports that she does not drink alcohol and does not use drugs.  Current Medications:  Medications Prior to Admission  Medication Sig Dispense Refill  . levETIRAcetam (KEPPRA) 500 MG tablet Take 1,500 mg by mouth 2 (two) times daily.    Marland Kitchen loratadine (CLARITIN) 10 MG tablet Take 10 mg by mouth daily.    Marland Kitchen PHENobarbital (LUMINAL) 16.2 MG tablet Take 3 tablets (48.6 mg total) by mouth 3 (three) times daily.    . phenytoin (DILANTIN) 100 MG ER capsule Take 300 mg by mouth at bedtime.    . promethazine (PHENERGAN) 25 MG tablet Take 25-50 mg by mouth every 8 (eight) hours as needed.    . SPS 15 GM/60ML suspension Take 60 mLs by mouth every Wednesday.    . Triamcinolone Acetonide (TRIAMCINOLONE 0.1 % CREAM : EUCERIN) CREA Apply 1 application topically 3 (three) times daily.     Marland Kitchen acetaminophen (TYLENOL) 325 MG tablet Take 650 mg by mouth every 6  (six) hours as needed.      Allergies:  Allergies as of 10/04/2019  . (No Known Allergies)    ROS:  Unable to obtain secondary to patient status    Objective:     BP 123/71 (BP Location: Right Arm)   Pulse 98   Temp 97.8 F (36.6 C) (Oral)   Resp 20   Ht 5\' 3"  (1.6 m)   Wt 61.4 kg   SpO2 100%   BMI 23.96 kg/m   Constitutional :  alert, cooperative, appears stated age and no distress  Lymphatics/Throat:  no asymmetry, masses, or scars  Respiratory:  clear to auscultation bilaterally  Cardiovascular:  regular rate and rhythm  Gastrointestinal: soft, non-tender; bowel sounds normal; no masses,  no organomegaly. NG in place, copious gastric contents  Musculoskeletal: Steady movement  Skin: Cool and moist   Psychiatric: Normal affect, non-agitated, not confused       LABS:  CMP Latest Ref Rng & Units 10/10/2019 10/09/2019 10/07/2019  Glucose 70 - 99 mg/dL 10/09/2019) 99 462(V)  BUN 8 - 23 mg/dL 7(L) 8 16  Creatinine 035(K - 1.00 mg/dL 0.93 8.18 2.99  Sodium 135 - 145 mmol/L 144 142 141  Potassium 3.5 - 5.1 mmol/L 3.5 3.5 3.6  Chloride 98 - 111 mmol/L 109 108 108  CO2 22 - 32 mmol/L 26 24 23   Calcium 8.9 - 10.3 mg/dL 3.71) ) 6.9(C)  Total Protein 6.5 -  8.1 g/dL - - -  Total Bilirubin 0.3 - 1.2 mg/dL - - -  Alkaline Phos 38 - 126 U/L - - -  AST 15 - 41 U/L - - -  ALT 0 - 44 U/L - - -   CBC Latest Ref Rng & Units 10/09/2019 10/04/2019 08/19/2019  WBC 4.0 - 10.5 K/uL 7.0 9.2 8.4  Hemoglobin 12.0 - 15.0 g/dL 10.8(L) 11.4(L) 10.9(L)  Hematocrit 36 - 46 % 33.1(L) 35.1(L) 34.8(L)  Platelets 150 - 400 K/uL 353 365 270    RADS: CLINICAL DATA:  Nausea, vomiting, and diarrhea.  EXAM: CT ABDOMEN AND PELVIS WITH CONTRAST  TECHNIQUE: Multidetector CT imaging of the abdomen and pelvis was performed using the standard protocol following bolus administration of intravenous contrast.  CONTRAST:  OMNIPAQUE IOHEXOL 350 MG/ML SOLN  COMPARISON:  Abdominal radiograph  dated 08/18/2019  FINDINGS: Lower chest: Tiny bilateral pleural effusions with slight bibasilar atelectasis. Cardiomegaly.  Hepatobiliary: Liver parenchyma is normal. There is a tiny amount of pericholecystic fluid and slight pericholecystic haziness with the gallbladder is not distended in the wall is not thickened. No visible stones. No biliary ductal dilatation.  Pancreas: Unremarkable. No pancreatic ductal dilatation or surrounding inflammatory changes.  Spleen: Normal in size without focal abnormality.  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 Right adrenal gland is normal. Small benign-appearing cysts in both kidneys. No hydronephrosis. There is thickening of the bladder wall haziness around the bladder with enhancement of the mucosa, suggestive of cystitis.  Stomach/Bowel: Stomach is within normal limits. Appendix appears normal. No evidence of bowel wall thickening, distention, or inflammatory changes.  Vascular/Lymphatic: No significant vascular findings are present. No enlarged abdominal or pelvic lymph nodes.  Reproductive: Uterus and bilateral adnexa are unremarkable.  Other: No abdominal wall hernia or abnormality. No abdominopelvic ascites.  Musculoskeletal: No acute or significant osseous findings.  IMPRESSION: 1. Findings consistent with cystitis. 2. Tiny amount of pericholecystic fluid and slight pericholecystic haziness without gallbladder wall thickening or visible stones. This could represent early cholecystitis. 3. Tiny bilateral pleural effusions with slight bibasilar atelectasis. 4. 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   Electronically Signed   By: Francene Boyers M.D.   On: 10/04/2019 14:13  CLINICAL DATA:  Nausea and vomiting.  EXAM: ABDOMEN - 1 VIEW  COMPARISON:  Radiograph 08/18/2019 and CT scan 10/04/2019  FINDINGS: The stomach is distended and filled with air and debris.  Possible gastric outlet obstruction or gastroparesis. No findings suspicious for small bowel obstruction or free air.  IMPRESSION: Distended stomach. Possible gastric outlet obstruction or gastroparesis.   Electronically Signed   By: Rudie Meyer M.D.   On: 10/08/2019 11:06 Assessment:   gastic outlet obstruction, possible intermittent gastric volvulus?  Other differential includes intrinsic obstruction such as neoplastic process.  Plan:   NG tube placement successful, patient acting comfortable, labs and vitals stable as well.  Pending EGD by GI.  Doubt any acute emergent issue such as volvulus with ischemia, necrosis, since it has been over 24hrs since initially noted on plain films.  Will await EGD results for now.  Continue supportive care with NG tube decompression and n.p.o. status.

## 2019-10-10 NOTE — Transfer of Care (Signed)
Immediate Anesthesia Transfer of Care Note  Patient: Mariah Jimenez  Procedure(s) Performed: ESOPHAGOGASTRODUODENOSCOPY (EGD) WITH PROPOFOL (N/A )  Patient Location: PACU  Anesthesia Type:General  Level of Consciousness: drowsy  Airway & Oxygen Therapy: Patient Spontanous Breathing and Patient connected to face mask oxygen  Post-op Assessment: Report given to RN and Post -op Vital signs reviewed and stable  Post vital signs: Reviewed and stable  Last Vitals:  Vitals Value Taken Time  BP 105/49 10/10/19 1213  Temp    Pulse 105 10/10/19 1213  Resp 17 10/10/19 1213  SpO2 99 % 10/10/19 1213  Vitals shown include unvalidated device data.  Last Pain:  Vitals:   10/10/19 0954  TempSrc: Temporal  PainSc: 0-No pain         Complications: No complications documented.

## 2019-10-10 NOTE — Progress Notes (Signed)
Nutrition Follow Up Note   DOCUMENTATION CODES:   Morbid obesity  INTERVENTION:   Boost Breeze po TID, each supplement provides 250 kcal and 9 grams of protein  MVI daily   NUTRITION DIAGNOSIS:   Inadequate oral intake related to acute illness (nausea, vomiting) as evidenced by per patient/family report.  GOAL:   Patient will meet greater than or equal to 90% of their needs -not met   MONITOR:   PO intake, Diet advancement, Labs, Weight trends, Supplement acceptance  ASSESSMENT:   74 year old female admitted for intractable nausea and vomiting and UTI with past medical history of intellectual disability and seizure disorder presented from nursing facility with 3 day history of nausea, vomiting, and single episode of diarrhea.   Pt s/p EGD today; noted to have white nummular lesions in esophageal mucosa, gastritis and non-obstructing gastric polyp; biopsies taken.   Unable to speak with pt as pt is non-verbal. Per chart, pt eating 50-100% of meals up until 6/27. Pt was scheduled to discharge on 6/27 but developed projectile vomiting. There was concern for gastric outlet obstruction on KUB. NGT placed by IR yesterday with high output but was removed during EGD today. No evidence of GOO on EGD. Pt with possible gastroparesis; pt initiated on reglan today. Pt initiated on clear liquid diet. RD will increase Boost Breeze to three times daily and d/c Ensure Enlive. Plan is for diet advancement as tolerated; can switch to Ensure once diet advanced.   Weight from 6/29 appears inaccurate; would recommend obtain new weight.     Medications reviewed and include: lovenox, reglan, protonix, LRS w/ 5% dextrose _0 /hr  Labs reviewed: BUN 7(L), Mg 1.6(L) Hgb 10.8(L), Hct 33.1(L)  Diet Order:   Diet Order            Diet clear liquid Room service appropriate? Yes; Fluid consistency: Thin  Diet effective now                EDUCATION NEEDS:   No education needs have been identified  at this time  Skin:  Skin Assessment: Skin Integrity Issues: Skin Integrity Issues:: Stage II Stage II: sacrum  Last BM:  6/28- type 6  Height:   Ht Readings from Last 1 Encounters:  10/10/19 _1  (1.6 m)    Weight:   Wt Readings from Last 1 Encounters:  10/09/19 61.4 kg    BMI:  Body mass index is 23.96 kg/m.  Estimated Nutritional Needs:   Kcal:  2200-2500kcal/day  Protein:  110-125g/day  Fluid:  1.6-1.8L/day  Koleen Distance MS, RD, LDN Please refer to [facility] for RD and/or RD on-call/weekend/after hours pager

## 2019-10-10 NOTE — Anesthesia Procedure Notes (Signed)
Procedure Name: Intubation Date/Time: 10/10/2019 11:37 AM Performed by: Elmarie Mainland, CRNA Pre-anesthesia Checklist: Patient identified, Emergency Drugs available, Suction available and Patient being monitored Patient Re-evaluated:Patient Re-evaluated prior to induction Oxygen Delivery Method: Circle system utilized Preoxygenation: Pre-oxygenation with 100% oxygen Induction Type: IV induction and Rapid sequence Laryngoscope Size: McGraph and 3 Grade View: Grade I Tube type: Oral Tube size: 7.0 mm Number of attempts: 1 Airway Equipment and Method: Stylet,  Oral airway and Video-laryngoscopy Placement Confirmation: ETT inserted through vocal cords under direct vision,  positive ETCO2 and breath sounds checked- equal and bilateral Secured at: 23 cm Tube secured with: Tape Dental Injury: Teeth and Oropharynx as per pre-operative assessment

## 2019-10-10 NOTE — Op Note (Signed)
[facility] Gastroenterology Patient Name: Mariah Jimenez Procedure Date: 10/10/2019 11:29 AM MRN: [id number] Account #: [id number] Date of Birth: 1946-01-28 Admit Type: Outpatient Age: 74 Room: [facility] ENDO ROOM 3 Gender: Female Note Status: Finalized Procedure:             Upper GI endoscopy Indications:           Abnormal abdominal x-ray of the GI tract, Nausea with                         vomiting Providers:             Tlaloc Taddei B. Maximino Greenland MD, MD Referring MD:          Sallye Lat Md, MD (Referring MD) Medicines:             Monitored Anesthesia Care Complications:         No immediate complications. Procedure:             Pre-Anesthesia Assessment:                        - The risks and benefits of the procedure and the                         sedation options and risks were discussed with the                         patient. All questions were answered and informed                         consent was obtained.                        - Patient identification and proposed procedure were                         verified prior to the procedure.                        - ASA Grade Assessment: III - A patient with severe                         systemic disease.                        After obtaining informed consent, the endoscope was                         passed under direct vision. Throughout the procedure,                         the patient's blood pressure, pulse, and oxygen                         saturations were monitored continuously. The Endoscope                         was introduced through the mouth, and advanced to the                         second part of  duodenum. The upper GI endoscopy was                         accomplished with ease. The patient tolerated the                         procedure well. Findings:      White nummular lesions were noted in the mid esophagus. Biopsies were       taken with a cold forceps for histology.      The exam  of the esophagus was otherwise normal.      Patchy mild inflammation was found in the gastric body.      A single 6 mm semi-pendunculated polyp with no bleeding and no stigmata       of recent bleeding was found in the gastric antrum. Biopsies were taken       with a cold forceps for histology. This was not obstructing the duodenal       bulb opening and was not on a big stalk and is not suspected to be       causing gastric outlet obstruction.      Mildly erythematous mucosa without bleeding was found in the gastric       body. This was from NG tube trauma. Since no evidence of Gastric outlet       obstruction was present, and since NG tube inadvertently came out upon       removal of the EGD scope, it was discontinued and completely removed      The examined duodenum was normal. Impression:            - White nummular lesions in esophageal mucosa.                         Biopsied.                        - Gastritis.                        - A single gastric polyp. Biopsied.                        - Erythematous mucosa in the gastric body.                        - Normal examined duodenum. Recommendation:        - Await pathology results.                        - Perform an H. pylori serology today.                        - PPI once daily for 30-60 days                        - Return patient to hospital ward for ongoing care.                        - Continue present medications.                        - The findings and recommendations were discussed with  the patient.                        - The findings and recommendations were discussed with                         the patient's family.                        - Return to my office in 2 weeks. Procedure Code(s):     --- Professional ---                        [phone], Esophagogastroduodenoscopy, flexible,                         transoral; with biopsy, single or multiple Diagnosis Code(s):     ---  Professional ---                        K22.8, Other specified diseases of esophagus                        K29.70, Gastritis, unspecified, without bleeding                        K31.7, Polyp of stomach and duodenum                        K31.89, Other diseases of stomach and duodenum                        R11.2, Nausea with vomiting, unspecified                        R93.3, Abnormal findings on diagnostic imaging of                         other parts of digestive tract CPT copyright 2019 American Medical Association. All rights reserved. The codes documented in this report are preliminary and upon coder review may  be revised to meet current compliance requirements.  Melodie Bouillon, MD Michel Bickers B. Maximino Greenland MD, MD 10/10/2019 12:14:33 PM This report has been signed electronically. Number of Addenda: 0 Note Initiated On: 10/10/2019 11:29 AM Estimated Blood Loss:  Estimated blood loss: none.      [facility]

## 2019-10-11 ENCOUNTER — Encounter: Payer: Self-pay | Admitting: Gastroenterology

## 2019-10-11 DIAGNOSIS — N3090 Cystitis, unspecified without hematuria: Secondary | ICD-10-CM | POA: Diagnosis not present

## 2019-10-11 DIAGNOSIS — G40909 Epilepsy, unspecified, not intractable, without status epilepticus: Secondary | ICD-10-CM | POA: Diagnosis not present

## 2019-10-11 DIAGNOSIS — Z79899 Other long term (current) drug therapy: Secondary | ICD-10-CM | POA: Diagnosis not present

## 2019-10-11 DIAGNOSIS — E669 Obesity, unspecified: Secondary | ICD-10-CM | POA: Diagnosis not present

## 2019-10-11 DIAGNOSIS — Z66 Do not resuscitate: Secondary | ICD-10-CM | POA: Diagnosis not present

## 2019-10-11 DIAGNOSIS — K3184 Gastroparesis: Secondary | ICD-10-CM | POA: Diagnosis not present

## 2019-10-11 DIAGNOSIS — Z7401 Bed confinement status: Secondary | ICD-10-CM | POA: Diagnosis not present

## 2019-10-11 DIAGNOSIS — F79 Unspecified intellectual disabilities: Secondary | ICD-10-CM | POA: Diagnosis not present

## 2019-10-11 DIAGNOSIS — E871 Hypo-osmolality and hyponatremia: Secondary | ICD-10-CM | POA: Diagnosis not present

## 2019-10-11 DIAGNOSIS — Z6841 Body Mass Index (BMI) 40.0 and over, adult: Secondary | ICD-10-CM | POA: Diagnosis not present

## 2019-10-11 DIAGNOSIS — K317 Polyp of stomach and duodenum: Secondary | ICD-10-CM | POA: Diagnosis not present

## 2019-10-11 DIAGNOSIS — B0089 Other herpesviral infection: Secondary | ICD-10-CM | POA: Diagnosis not present

## 2019-10-11 DIAGNOSIS — K2950 Unspecified chronic gastritis without bleeding: Secondary | ICD-10-CM | POA: Diagnosis not present

## 2019-10-11 DIAGNOSIS — L89152 Pressure ulcer of sacral region, stage 2: Secondary | ICD-10-CM | POA: Diagnosis not present

## 2019-10-11 DIAGNOSIS — Z20822 Contact with and (suspected) exposure to covid-19: Secondary | ICD-10-CM | POA: Diagnosis not present

## 2019-10-11 DIAGNOSIS — E860 Dehydration: Secondary | ICD-10-CM | POA: Diagnosis not present

## 2019-10-11 DIAGNOSIS — R112 Nausea with vomiting, unspecified: Secondary | ICD-10-CM | POA: Diagnosis not present

## 2019-10-11 DIAGNOSIS — K208 Other esophagitis without bleeding: Secondary | ICD-10-CM | POA: Diagnosis not present

## 2019-10-11 DIAGNOSIS — E876 Hypokalemia: Secondary | ICD-10-CM | POA: Diagnosis not present

## 2019-10-11 DIAGNOSIS — R000 Tachycardia, unspecified: Secondary | ICD-10-CM | POA: Diagnosis not present

## 2019-10-11 LAB — BASIC METABOLIC PANEL
Anion gap: 7 (ref 5–15)
BUN: 6 mg/dL — ABNORMAL LOW (ref 8–23)
CO2: 26 mmol/L (ref 22–32)
Calcium: 8.5 mg/dL — ABNORMAL LOW (ref 8.9–10.3)
Chloride: 110 mmol/L (ref 98–111)
Creatinine, Ser: 0.71 mg/dL (ref 0.44–1.00)
GFR calc Af Amer: >60 mL/min (ref >60)
GFR calc non Af Amer: >60 mL/min (ref >60)
Glucose, Bld: 95 mg/dL (ref 70–99)
Potassium: 3.3 mmol/L — ABNORMAL LOW (ref 3.5–5.1)
Sodium: 143 mmol/L (ref 135–145)

## 2019-10-11 LAB — HIV ANTIBODY (ROUTINE TESTING W REFLEX): HIV Screen 4th Generation wRfx: NONREACTIVE

## 2019-10-11 LAB — HEPATIC FUNCTION PANEL
ALT: 18 U/L (ref 0–44)
AST: 19 U/L (ref 15–41)
Albumin: 2.3 g/dL — ABNORMAL LOW (ref 3.5–5.0)
Alkaline Phosphatase: 153 U/L — ABNORMAL HIGH (ref 38–126)
Bilirubin, Direct: 0.1 mg/dL (ref 0.0–0.2)
Indirect Bilirubin: 0.4 mg/dL (ref 0.3–0.9)
Total Bilirubin: 0.5 mg/dL (ref 0.3–1.2)
Total Protein: 7 g/dL (ref 6.5–8.1)

## 2019-10-11 LAB — MAGNESIUM: Magnesium: 1.5 mg/dL — ABNORMAL LOW (ref 1.7–2.4)

## 2019-10-11 MED ORDER — MAGNESIUM SULFATE 4 GM/100ML IV SOLN
4.0000 g | Freq: Once | INTRAVENOUS | Status: AC
  Administered 2019-10-11: 4 g via INTRAVENOUS
  Filled 2019-10-11: qty 100

## 2019-10-11 MED ORDER — DEXTROSE 5 % IV SOLN
300.0000 mg | Freq: Three times a day (TID) | INTRAVENOUS | Status: DC
  Administered 2019-10-11: 13:00:00 300 mg via INTRAVENOUS
  Filled 2019-10-11 (×4): qty 6

## 2019-10-11 MED ORDER — POTASSIUM CHLORIDE 20 MEQ PO PACK
40.0000 meq | PACK | Freq: Once | ORAL | Status: AC
  Administered 2019-10-11: 16:00:00 40 meq via ORAL
  Filled 2019-10-11: qty 2

## 2019-10-11 MED ORDER — DEXTROSE 5 % IV SOLN
500.0000 mg | Freq: Three times a day (TID) | INTRAVENOUS | Status: DC
  Administered 2019-10-11 – 2019-10-14 (×8): 500 mg via INTRAVENOUS
  Filled 2019-10-11 (×10): qty 10

## 2019-10-11 NOTE — Consult Note (Signed)
NAME: Mariah Jimenez  DOB: 10-11-1945  MRN: [id number]  Date/Time: 10/11/2019 7:24 PM  REQUESTING PROVIDER: Dr.Chatarjee Subjective:  REASON FOR CONSULT: Herpes esophagitis ?No history available from patient and chart reviewed. Mariah Jimenez is a 74 y.o. female with a history of significant mental disability and seizure disorder presented to the emergency room on 10/04/2019 from [facility] health care with a 3-day history of nausea and vomiting and a single episode of diarrhea.  She was unable to take her seizure medications because of that.As per the chart she had an outpatient abdominal x-ray that was concerning for small bowel obstruction so she was sent into the emergency room for evaluation.  In the ED vitals were temperature of 97.6, blood pressure of 144 x 87,Heart rate of 99, pulse ox of 95%, and respiratory rate of 20. Labs revealed a hemoglobin of 11.4, WBC of 9.2, platelet of 365,Creatinine of 0.61,    xray abdomen showed distended stomach filled with debris. It was decompressed with NG tube. 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 I am asked to see the patient for the same  Past Medical History:  Diagnosis Date  . Mental retardation   . Seizures (HCC)     Past Surgical History:  Procedure Laterality Date  . ESOPHAGOGASTRODUODENOSCOPY (EGD) WITH PROPOFOL N/A 10/10/2019   Procedure: ESOPHAGOGASTRODUODENOSCOPY (EGD) WITH PROPOFOL;  Surgeon: Pasty Spillers, MD;  Location: [facility] ENDOSCOPY;  Service: Endoscopy;  Laterality: N/A;  . NO PAST SURGERIES    . TIBIA IM NAIL INSERTION Right 04/09/2016   Procedure: INTRAMEDULLARY (IM) NAIL TIBIAL;  Surgeon: Christena Flake, MD;  Location: [facility] ORS;  Service: Orthopedics;  Laterality: Right;    Social History   Socioeconomic History  . Marital status: Single    Spouse name: Not on file  . Number of children: Not on file   . Years of education: Not on file  . Highest education level: Not on file  Occupational History  . Not on file  Tobacco Use  . Smoking status: Never Smoker  . Smokeless tobacco: Never Used  Substance and Sexual Activity  . Alcohol use: No  . Drug use: No  . Sexual activity: Not on file  Other Topics Concern  . Not on file  Social History Narrative  . Not on file   Social Determinants of Health   Financial Resource Strain:   . Difficulty of Paying Living Expenses:   Food Insecurity:   . Worried About Programme researcher, broadcasting/film/video in the Last Year:   . Barista in the Last Year:   Transportation Needs:   . Freight forwarder (Medical):   Marland Kitchen Lack of Transportation (Non-Medical):   Physical Activity:   . Days of Exercise per Week:   . Minutes of Exercise per Session:   Stress:   . Feeling of Stress :   Social Connections:   . Frequency of Communication with Friends and Family:   . Frequency of Social Gatherings with Friends and Family:   . Attends Religious Services:   . Active Member of Clubs or Organizations:   . Attends Banker Meetings:   Marland Kitchen Marital Status:   Intimate Partner Violence:   . Fear of Current or Ex-Partner:   . Emotionally Abused:   Marland Kitchen Physically Abused:   . Sexually Abused:     Family History  Family history unknown: Yes  No Known Allergies  ? Current Facility-Administered Medications  Medication Dose Route Frequency Provider Last Rate Last Admin  . acetaminophen (TYLENOL) tablet 650 mg  650 mg Oral Q6H PRN Andris Baumann, MD       Or  . acetaminophen (TYLENOL) suppository 650 mg  650 mg Rectal Q6H PRN Andris Baumann, MD      . acyclovir (ZOVIRAX) 500 mg in dextrose 5 % 100 mL IVPB  500 mg Intravenous Q8H Solveig Fangman, MD      . dextrose 5 % in lactated ringers infusion   Intravenous Continuous Coralie Keens, MD 50 mL/hr at 10/11/19 1019 New Bag at 10/11/19 1019  . enoxaparin (LOVENOX) injection 40 mg  40 mg  Subcutaneous Q24H Andris Baumann, MD   40 mg at 10/10/19 2052  . feeding supplement (BOOST / RESOURCE BREEZE) liquid 1 Container  1 Container Oral TID BM Arnetha Courser, MD   1 Container at 10/11/19 1320  . levETIRAcetam (KEPPRA) 100 MG/ML solution 1,500 mg  1,500 mg Oral BID Arrien, York Ram, MD   1,500 mg at 10/11/19 0915  . loratadine (CLARITIN) tablet 10 mg  10 mg Oral Daily Andris Baumann, MD   10 mg at 10/11/19 0913  . metoCLOPramide (REGLAN) tablet 5 mg  5 mg Oral TID AC & HS Arnetha Courser, MD   5 mg at 10/11/19 1613  . multivitamin with minerals tablet 1 tablet  1 tablet Oral Daily Arrien, York Ram, MD   1 tablet at 10/11/19 0913  . ondansetron (ZOFRAN) tablet 4 mg  4 mg Oral Q6H PRN Andris Baumann, MD       Or  . ondansetron [facility]) injection 4 mg  4 mg Intravenous Q6H PRN Andris Baumann, MD      . pantoprazole sodium (PROTONIX) 40 mg/20 mL oral suspension 40 mg  40 mg Per Tube BID Arrien, York Ram, MD   40 mg at 10/11/19 1114  . PHENobarbital (LUMINAL) tablet 48.6 mg  48.6 mg Oral TID Coralie Keens, MD   48.6 mg at 10/11/19 1612  . phenytoin (DILANTIN) ER capsule 300 mg  300 mg Oral QHS Arrien, York Ram, MD   300 mg at 10/10/19 2059  . sodium chloride flush (NS) 0.9 % injection 10-40 mL  10-40 mL Intracatheter Q12H Andris Baumann, MD   10 mL at 10/11/19 0915  . sodium chloride flush (NS) 0.9 % injection 10-40 mL  10-40 mL Intracatheter PRN Andris Baumann, MD         Abtx:  Anti-infectives (From admission, onward)   Start     Dose/Rate Route Frequency Ordered Stop   10/11/19 2200  acyclovir (ZOVIRAX) 500 mg in dextrose 5 % 100 mL IVPB     Discontinue     500 mg 110 mL/hr over 60 Minutes Intravenous Every 8 hours 10/11/19 1923     10/11/19 1400  acyclovir (ZOVIRAX) 300 mg in dextrose 5 % 100 mL IVPB  Status:  Discontinued        300 mg 106 mL/hr over 60 Minutes Intravenous Every 8 hours 10/11/19 1226 10/11/19 1923   10/07/19 1200   amoxicillin-clavulanate (AUGMENTIN) 875-125 MG per tablet 1 tablet  Status:  Discontinued        1 tablet Oral Every 12 hours 10/07/19 1148 10/09/19 1209   10/07/19 0000  amoxicillin-clavulanate (AUGMENTIN) 875-125 MG tablet     Discontinue     1 tablet Oral Every 12 hours 10/07/19  1216 10/12/19 2359   10/05/19 2000  cefTRIAXone (ROCEPHIN) 1 g in sodium chloride 0.9 % 100 mL IVPB  Status:  Discontinued        1 g 200 mL/hr over 30 Minutes Intravenous Every 24 hours 10/04/19 2027 10/07/19 1148   10/04/19 2000  cefTRIAXone (ROCEPHIN) 1 g in sodium chloride 0.9 % 100 mL IVPB        1 g 200 mL/hr over 30 Minutes Intravenous  Once 10/04/19 1958 10/04/19 2150   10/04/19 1600  fosfomycin (MONUROL) packet 3 g        3 g Oral  Once 10/04/19 1548 10/04/19 1638   10/04/19 1530  cefTRIAXone (ROCEPHIN) 1 g in sodium chloride 0.9 % 100 mL IVPB  Status:  Discontinued        1 g 200 mL/hr over 30 Minutes Intravenous  Once 10/04/19 1524 10/04/19 1548      REVIEW OF SYSTEMS:  NA  Objective:  VITALS:  BP 103/60 (BP Location: Right Wrist)   Pulse 93   Temp 98.5 F (36.9 C)   Resp 17   Ht 5\' 3"  (1.6 m)   Wt 61.4 kg   SpO2 96%   BMI 23.96 kg/m  PHYSICAL EXAM:  General: lethargic, on calling her name she tried to say something which was uninteligible Lower lip hypertrophied. Tongue out of the mouth, Obese -wt  in Epic is wrong- she very likely weighs > 100 Kg Head: Normocephalic, without obvious abnormality, atraumatic. Eyes: Conjunctivae clear, anicteric sclerae. Pupils are equal ENT cannot examine Cannot examine the interior of the mouth Neck: Supple,  Back: did not examine Lungs: b/l air entry Heart: s1s2 Abdomen: Soft,  distended. Bowel sounds normal. No masses Extremities: edematous Skin: No rashes or lesions. Or bruising Lymph: Cervical, supraclavicular normal. Neurologic: cannot be assessed Pertinent Labs Lab Results CBC    Component Value Date/Time   WBC 7.0 10/09/2019 0503    RBC 3.99 10/09/2019 0503   HGB 10.8 (L) 10/09/2019 0503   HCT 33.1 (L) 10/09/2019 0503   PLT 353 10/09/2019 0503   MCV 83.0 10/09/2019 0503   MCH 27.1 10/09/2019 0503   MCHC 32.6 10/09/2019 0503   RDW 16.5 (H) 10/09/2019 0503   LYMPHSABS 1.4 07/06/2019 1211   MONOABS 0.3 07/06/2019 1211   EOSABS 0.2 07/06/2019 1211   BASOSABS 0.1 07/06/2019 1211    CMP Latest Ref Rng & Units 10/11/2019 10/10/2019 10/09/2019  Glucose 70 - 99 mg/dL 95 161(W115(H) 99  BUN 8 - 23 mg/dL 6(L) 7(L) 8  Creatinine 0.44 - 1.00 mg/dL 9.600.71 4.540.61 0.980.54  Sodium 135 - 145 mmol/L 143 144 142  Potassium 3.5 - 5.1 mmol/L 3.3(L) 3.5 3.5  Chloride 98 - 111 mmol/L 110 109 108  CO2 22 - 32 mmol/L 26 26 24   Calcium 8.9 - 10.3 mg/dL 1.1(B8.5(L) 1.4(N8.8(L) 8.2(N8.7(L)  Total Protein 6.5 - 8.1 g/dL - - -  Total Bilirubin 0.3 - 1.2 mg/dL - - -  Alkaline Phos 38 - 126 U/L - - -  AST 15 - 41 U/L - - -  ALT 0 - 44 U/L - - -      Microbiology: Recent Results (from the past 240 hour(s))  Urine culture     Status: Abnormal   Collection Time: 10/04/19 12:44 PM   Specimen: Urine, Random  Result Value Ref Range Status   Specimen Description   Final    URINE, RANDOM Performed at [facility], [address] [place], [place]  [address]    Special Requests   Final    NONE Performed at [facility], [address] [place], [place] [address]    Culture (A)  Final    >=100,000 COLONIES/mL MULTIPLE SPECIES PRESENT, SUGGEST RECOLLECTION   Report Status 10/06/2019 FINAL  Final  SARS Coronavirus 2 by RT PCR (hospital order, performed in [facility] hospital lab) Nasopharyngeal Nasopharyngeal Swab     Status: None   Collection Time: 10/04/19  8:41 PM   Specimen: Nasopharyngeal Swab  Result Value Ref Range Status   SARS Coronavirus 2 NEGATIVE NEGATIVE Final    Comment: (NOTE) SARS-CoV-2 target nucleic acids are NOT DETECTED.  The SARS-CoV-2 RNA is generally detectable in upper and lower respiratory specimens during the acute  phase of infection. The lowest concentration of SARS-CoV-2 viral copies this assay can detect is 250 copies / mL. A negative result does not preclude SARS-CoV-2 infection and should not be used as the sole basis for treatment or other patient management decisions.  A negative result may occur with improper specimen collection / handling, submission of specimen other than nasopharyngeal swab, presence of viral mutation(s) within the areas targeted by this assay, and inadequate number of viral copies (<250 copies / mL). A negative result must be combined with clinical observations, patient history, and epidemiological information.  Fact Sheet for Patients:   BoilerBrush.com.cy  Fact Sheet for Healthcare Providers: https://pope.com/  This test is not yet approved or  cleared by the [place] FDA and has been authorized for detection and/or diagnosis of SARS-CoV-2 by FDA under an Emergency Use Authorization (EUA).  This EUA will remain in effect (meaning this test can be used) for the duration of the COVID-19 declaration under Section 564(b)(1) of the Act, 21 U.S.C. section 360bbb-3(b)(1), unless the authorization is terminated or revoked sooner.  Performed at [facility], [address] [place], [place] [address]     IMAGING RESULTS:  I have personally reviewed the films ? 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 She weighs more than 60kg- likely 100kg  So dose will be 500mg  Q8- asked the nurse to get the correct weight May need fluconazole as there is possible candida esophagitis as well HIV checked today and it is negative ? ___________________________________________________ Discussed with care  team Note:  This document was prepared using Dragon voice recognition software and may include unintentional dictation errors.

## 2019-10-11 NOTE — Progress Notes (Addendum)
Mariah Bouillon, MD [address], [place], [place], [address] [address], [place], [place], [address] Phone: [phone]  Fax: [phone]   Subjective:  No further nausea vomiting.  Objective: Exam: Vital signs in last 24 hours: Vitals:   10/10/19 2053 10/11/19 0408 10/11/19 0724 10/11/19 1147  BP: 124/60 (!) 111/56 128/64 124/64  Pulse: (!) 101 100 (!) 101 93  Resp: 16 16 18 18   Temp: 97.6 F (36.4 C) 99 F (37.2 C) 97.8 F (36.6 C) 98.5 F (36.9 C)  TempSrc: Oral  Oral   SpO2: 99% 96% 98% 96%  Weight:      Height:       Weight change:   Intake/Output Summary (Last 24 hours) at 10/11/2019 1545 Last data filed at 10/11/2019 1143 Gross per 24 hour  Intake 240 ml  Output 200 ml  Net 40 ml    General: No acute distress Abd: Soft, NT/ND, No HSM Skin: Warm, no rashes Neck: Supple, Trachea midline   Lab Results: Lab Results  Component Value Date   WBC 7.0 10/09/2019   HGB 10.8 (L) 10/09/2019   HCT 33.1 (L) 10/09/2019   MCV 83.0 10/09/2019   PLT 353 10/09/2019   Micro Results: Recent Results (from the past 240 hour(s))  Urine culture     Status: Abnormal   Collection Time: 10/04/19 12:44 PM   Specimen: Urine, Random  Result Value Ref Range Status   Specimen Description   Final    URINE, RANDOM Performed at [facility], [address], [place], [place]    Special Requests   Final    NONE Performed at [facility], [address] [place], [place]    Culture (A)  Final    >=100,000 COLONIES/mL MULTIPLE SPECIES PRESENT, SUGGEST RECOLLECTION   Report Status 10/06/2019 FINAL  Final  SARS Coronavirus 2 by RT PCR (hospital order, performed in [facility] hospital lab) Nasopharyngeal Nasopharyngeal Swab     Status: None   Collection Time: 10/04/19  8:41 PM   Specimen: Nasopharyngeal Swab  Result Value Ref Range Status   SARS Coronavirus 2 NEGATIVE NEGATIVE Final    Comment:  (NOTE) SARS-CoV-2 target nucleic acids are NOT DETECTED.  The SARS-CoV-2 RNA is generally detectable in upper and lower respiratory specimens during the acute phase of infection. The lowest concentration of SARS-CoV-2 viral copies this assay can detect is 250 copies / mL. A negative result does not preclude SARS-CoV-2 infection and should not be used as the sole basis for treatment or other patient management decisions.  A negative result may occur with improper specimen collection / handling, submission of specimen other than nasopharyngeal swab, presence of viral mutation(s) within the areas targeted by this assay, and inadequate number of viral copies (<250 copies / mL). A negative result must be combined with clinical observations, patient history, and epidemiological information.  Fact Sheet for Patients:   10/06/19  Fact Sheet for Healthcare Providers: BoilerBrush.com.cy  This test is not yet approved or  cleared by the https://pope.com/ FDA and has been authorized for detection and/or diagnosis of SARS-CoV-2 by FDA under an Emergency Use Authorization (EUA).  This EUA will remain in effect (meaning this test can be used) for the duration of the COVID-19 declaration under Section 564(b)(1) of the Act, 21 U.S.C. section 360bbb-3(b)(1), unless the authorization is terminated or revoked sooner.  Performed at [facility], [address], [place], [place]  Studies/Results: No results found. Medications:  Scheduled Meds: . enoxaparin (LOVENOX) injection  40 mg Subcutaneous Q24H  . feeding supplement  1 Container Oral TID BM  . levETIRAcetam  1,500 mg Oral BID  . loratadine  10 mg Oral Daily  . metoCLOPramide  5 mg Oral TID AC & HS  . multivitamin with minerals  1 tablet Oral Daily  . pantoprazole sodium  40 mg Per Tube BID  . phenobarbital  48.6 mg Oral TID  . phenytoin  300 mg Oral QHS  .  potassium chloride  40 mEq Oral Once  . sodium chloride flush  10-40 mL Intracatheter Q12H   Continuous Infusions: . acyclovir 300 mg (10/11/19 1324)  . dextrose 5% lactated ringers 50 mL/hr at 10/11/19 1019  . 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  Results discussed with primary attending, Dr. Luberta Robertson.  ID consult was recommended and they have recommended treatment.  Chronic gastritis noted on gastric biopsies.  H. Pylori addendum pending  HIV testing recommended if not done recently    LOS: 6 days   Mariah Bouillon, MD 10/11/2019, 3:45 PM

## 2019-10-11 NOTE — Progress Notes (Signed)
PROGRESS NOTE    Mariah Jimenez  QQP:[id number]  DOB: 1945-11-02  DOA: 10/04/2019 PCP: Randell Loop, [facility]:   Hospital course:  74 year old female with intellectual disability who lives in a nursing home and seizure disorder was admitted 10/04/2019 with voluminous vomiting.  Patient was treated with NG tube which revealed a very large amount of material which was decompressed.  Patient was seen by general surgery who did not feel that volvulus was likely.  She was seen by GI for possible gastric outlet obstruction and underwent EGD on 10/10/2019.  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  Subjective:  Patient is unable to provide a meaningful history but she does respond and vocalize what sounds like yes when I ask her her name.  She does track me with her eyes.  Patient sister Mariah Jimenez was in the room who notes that she has been tolerating po without vomiting since yesterday.   Objective: Vitals:   10/10/19 2053 10/11/19 0408 10/11/19 0724 10/11/19 1147  BP: 124/60 (!) 111/56 128/64 124/64  Pulse: (!) 101 100 (!) 101 93  Resp: 16 16 18 18   Temp: 97.6 F (36.4 C) 99 F (37.2 C) 97.8 F (36.6 C) 98.5 F (36.9 C)  TempSrc: Oral  Oral   SpO2: 99% 96% 98% 96%  Weight:      Height:        Intake/Output Summary (Last 24 hours) at 10/11/2019 1350 Last data filed at 10/11/2019 1143 Gross per 24 hour  Intake 240 ml  Output 200 ml  Net 40 ml   Filed Weights   10/04/19 1239 10/09/19 1033  Weight: 135 kg 61.4 kg     Exam:  General: Obese patient with large protuberant lower lip lying in bed being fed by the nurse in no acute distress.  She is awake and alert and does follow me with her eyes but is unable to be meaningfully vocal. Eyes: sclera anicteric, conjuctiva mild injection bilaterally CVS: S1-S2, regular  Respiratory:  decreased air entry bilaterally secondary to decreased inspiratory effort, rales at  bases  GI: NABS, soft, NT  LE: No edema.  Neuro: Patient is alert.  She is moving all of her extremities without difficulty. Psych: Nonverbal patient with severe cognitive delay.  Assessment & Plan:   Herpes esophagitis Start acyclovir per pharmacy protocol. ID consultation initiated and is pending. I am not sure if this is the cause of her profound gastroparesis/GOO.  Vomiting/gastric outlet obstruction/gastroparesis.  She has responded well to conservative management with NG tube. Is presently tolerating small amounts of p.o. without further vomiting since yesterday. EGD done 10/10/2019 did not show any gastric outlet obstruction or other mechanical cause. She did have some gastritis. General surgery saw patient and not believe this was related to volvulus. Advance diet to full liquids. Continue IV Protonix for gastritis. Continue Reglan  UTI Status post treatment with ceftriaxone for multiple organisms seen on urine culture.  Seizure disorder.  Continue home dose of Keppra, phenobarbital and phenytoin.  Cognitive impairment/swallow dysfunction.  Patient is bedridden, nonverbal and completely dependent for all her ADLs for the past 2 years. She was evaluated by speech therapy and they were recommending dysphagia 1 diet. Patient's Sister 10/12/2019 is very attentive and is at bedside a lot.  Stage II sacral ulcer, present on admission. Continue local wound care.  Hypokalemia/hypomagnesemia Appreciate pharmacy assistance with repletion.  Obesity class 3.  BMI is 45 Obesity complicates all levels of care both  inpatient and outpatient.   DVT prophylaxis: Lovenox Code Status: DNR Family Communication: Discussed with Sister Mariah Jimenez Disposition Plan:   Patient is from: SNF  Anticipated Discharge Location: SNF  Barriers to Discharge: Initiation of treatment of herpes esophagitis with acyclovir started today  Is patient medically stable for Discharge: Not  yet   Consultants:  GI  General surgery  Procedures:  EGD done 10/10/2019  Antimicrobials:  Ceftriaxone  Acyclovir   Data Reviewed:  Basic Metabolic Panel: Recent Labs  Lab 10/05/19 0425 10/07/19 0421 10/09/19 0503 10/10/19 0519 10/11/19 0605  NA 139 141 142 144 143  K 3.4* 3.6 3.5 3.5 3.3*  CL 104 108 108 109 110  CO2 21* 23 24 26 26   GLUCOSE 103* 104* 99 115* 95  BUN 25* 16 8 7* 6*  CREATININE 0.74 0.61 0.54 0.61 0.71  CALCIUM 8.4* 8.4* 8.7* 8.8* 8.5*  MG  --  2.1 1.8 1.6* 1.5*   Liver Function Tests: No results for input(s): AST, ALT, ALKPHOS, BILITOT, PROT, ALBUMIN in the last 168 hours. No results for input(s): LIPASE, AMYLASE in the last 168 hours. No results for input(s): AMMONIA in the last 168 hours. CBC: Recent Labs  Lab 10/09/19 0503  WBC 7.0  HGB 10.8*  HCT 33.1*  MCV 83.0  PLT 353   Cardiac Enzymes: No results for input(s): CKTOTAL, CKMB, CKMBINDEX, TROPONINI in the last 168 hours. BNP (last 3 results) No results for input(s): PROBNP in the last 8760 hours. CBG: No results for input(s): GLUCAP in the last 168 hours.  Recent Results (from the past 240 hour(s))  Urine culture     Status: Abnormal   Collection Time: 10/04/19 12:44 PM   Specimen: Urine, Random  Result Value Ref Range Status   Specimen Description   Final    URINE, RANDOM Performed at [facility], [address] [place], [place]    Special Requests   Final    NONE Performed at [facility], [address] [place], [place]    Culture (A)  Final    >=100,000 COLONIES/mL MULTIPLE SPECIES PRESENT, SUGGEST RECOLLECTION   Report Status 10/06/2019 FINAL  Final  SARS Coronavirus 2 by RT PCR (hospital order, performed in [facility] hospital lab) Nasopharyngeal Nasopharyngeal Swab     Status: None   Collection Time: 10/04/19  8:41 PM   Specimen: Nasopharyngeal Swab  Result Value Ref Range Status   SARS Coronavirus 2 NEGATIVE  NEGATIVE Final    Comment: (NOTE) SARS-CoV-2 target nucleic acids are NOT DETECTED.  The SARS-CoV-2 RNA is generally detectable in upper and lower respiratory specimens during the acute phase of infection. The lowest concentration of SARS-CoV-2 viral copies this assay can detect is 250 copies / mL. A negative result does not preclude SARS-CoV-2 infection and should not be used as the sole basis for treatment or other patient management decisions.  A negative result may occur with improper specimen collection / handling, submission of specimen other than nasopharyngeal swab, presence of viral mutation(s) within the areas targeted by this assay, and inadequate number of viral copies (<250 copies / mL). A negative result must be combined with clinical observations, patient history, and epidemiological information.  Fact Sheet for Patients:   10/06/19  Fact Sheet for Healthcare Providers: BoilerBrush.com.cy  This test is not yet approved or  cleared by the https://pope.com/ FDA and has been authorized for detection and/or diagnosis of SARS-CoV-2 by FDA under an Emergency Use Authorization (EUA).  This EUA will remain in effect (meaning this test can be used) for the duration of the COVID-19 declaration under Section 564(b)(1) of the Act, 21 U.S.C. section 360bbb-3(b)(1), unless the authorization is terminated or revoked sooner.  Performed at [facility], [address] [place], [place] [address]       Studies: DG Abd 1 View  Result Date: 10/09/2019 CLINICAL DATA:  NG tube EXAM: ABDOMEN - 1 VIEW COMPARISON:  10/08/2019, 10/09/2019 FINDINGS: Esophageal tube is looped within the stomach with the tip projecting over the gastric body. Nonobstructed gas pattern. IMPRESSION: Esophageal tube tip overlies the gastric body. Electronically Signed   By: Jasmine Pang M.D.   On: 10/09/2019 15:40     Scheduled Meds: .  enoxaparin (LOVENOX) injection  40 mg Subcutaneous Q24H  . feeding supplement  1 Container Oral TID BM  . levETIRAcetam  1,500 mg Oral BID  . loratadine  10 mg Oral Daily  . metoCLOPramide  5 mg Oral TID AC & HS  . multivitamin with minerals  1 tablet Oral Daily  . pantoprazole sodium  40 mg Per Tube BID  . phenobarbital  48.6 mg Oral TID  . phenytoin  300 mg Oral QHS  . sodium chloride flush  10-40 mL Intracatheter Q12H   Continuous Infusions: . acyclovir 300 mg (10/11/19 1324)  . dextrose 5% lactated ringers 50 mL/hr at 10/11/19 1019    Principal Problem:   Intractable nausea and vomiting Active Problems:   Seizure disorder (HCC)   UTI (urinary tract infection)   Intellectual disability   Pressure injury of skin   Dehydration   Class 3 obesity   Gastric polyp     Asha Grumbine Orma Flaming, [facility]  If 7PM-7AM, please contact night-coverage [email or web address] Password [email or web address] 10/11/2019, 1:50 PM    LOS: 6 days

## 2019-10-11 NOTE — H&P (Addendum)
Pharmacy Antibiotic Note  Mariah Jimenez is a 74 y.o. female admitted on 10/04/2019 with herpes esophagitis as revealed by biopsy with accompanying white lesions in the esophagus.  Pharmacy has been consulted for acyclovir dosing.  ID is consulted.  Plan: Will start acyclovir 300 mg IV Q8 hours (~5 mg/kg).  Pharmacy will follow renal function and continue to adjust per protocol.  Height: 5\' 3"  (160 cm) Weight: 61.4 kg (135 lb 4.4 oz) IBW/kg (Calculated) : 52.4  Temp (24hrs), Avg:98 F (36.7 C), Min:97.4 F (36.3 C), Max:99 F (37.2 C)  Recent Labs  Lab 10/04/19 1244 10/04/19 1244 10/05/19 0425 10/07/19 0421 10/09/19 0503 10/10/19 0519 10/11/19 0605  WBC 9.2  --   --   --  7.0  --   --   CREATININE 0.79   < > 0.74 0.61 0.54 0.61 0.71  LATICACIDVEN 1.0  --   --   --   --   --   --    < > = values in this interval not displayed.    Estimated Creatinine Clearance: 52.6 mL/min (by C-G formula based on SCr of 0.71 mg/dL).    No Known Allergies  Antimicrobials this admission: Fosfomycin 6/24 x 1 Ceftriaxone 6/24 > 6/26 Augmentin 6/27 > 6/28  Dose adjustments this admission: None  Microbiology results: 6/24 Ucx: multiple species present 6/30 surgical pathology: HSV esophagitis  Thank you for allowing pharmacy to be a part of this patient's care.  7/30, PharmD 10/11/2019 12:29 PM

## 2019-10-11 NOTE — Consult Note (Signed)
PHARMACY CONSULT NOTE - FOLLOW UP  Pharmacy Consult for Electrolyte Monitoring and Replacement   Recent Labs: Potassium (mmol/L)  Date Value  10/11/2019 3.3 (L)   Magnesium (mg/dL)  Date Value  13/24/4010 1.5 (L)   Calcium (mg/dL)  Date Value  27/25/3664 8.5 (L)   Albumin (g/dL)  Date Value  40/34/7425 2.6 (L)   Sodium (mmol/L)  Date Value  10/11/2019 143     Assessment: Pharmacy consulted to manage potassium and magnesium in this 74 year old female admitted with nausea and vomiting.  She currently is tolerating small amounts of PO, and has not vomited since yesterday.  Goal of Therapy:  Electrolytes WNL  Plan:  Will give potassium 40 mEq x1, and magnesium 4 g IV x 1. Electrolytes with AM labs.  Cherly Hensen ,PharmD Clinical Pharmacist 10/11/2019 3:38 PM

## 2019-10-11 NOTE — Progress Notes (Signed)
Subjective:  CC: Mariah Jimenez is a 74 y.o. female  Hospital stay day 6, 1 Day Post-Op vomiting and EGD  HPI: No acute issue reported.  Tolerating clears.  ROS:  Unable to obtain secondary to patient status  Objective:   Temp:  [97.1 F (36.2 C)-99 F (37.2 C)] 97.8 F (36.6 C) (07/01 0724) Pulse Rate:  [96-105] 101 (07/01 0724) Resp:  [16-18] 18 (07/01 0724) BP: (105-142)/(49-69) 128/64 (07/01 0724) SpO2:  [96 %-100 %] 98 % (07/01 0724)     Height: 5\' 3"  (160 cm) Weight: 61.4 kg BMI (Calculated): 23.97   Intake/Output this shift:   Intake/Output Summary (Last 24 hours) at 10/11/2019 12/12/2019 Last data filed at 10/10/2019 1213 Gross per 24 hour  Intake 300 ml  Output --  Net 300 ml    Constitutional :  alert, cooperative, appears stated age and no distress  Respiratory:  clear to auscultation bilaterally  Cardiovascular:  regular rate and rhythm  Gastrointestinal: soft, non-tender; bowel sounds normal; no masses,  no organomegaly.   Skin: Cool and moist.   Psychiatric: Normal affect, non-agitated, not confused       LABS:  CMP Latest Ref Rng & Units 10/11/2019 10/10/2019 10/09/2019  Glucose 70 - 99 mg/dL 95 10/11/2019) 99  BUN 8 - 23 mg/dL 6(L) 7(L) 8  Creatinine 0.44 - 1.00 mg/dL 371(G 6.26 9.48  Sodium 135 - 145 mmol/L 143 144 142  Potassium 3.5 - 5.1 mmol/L 3.3(L) 3.5 3.5  Chloride 98 - 111 mmol/L 110 109 108  CO2 22 - 32 mmol/L 26 26 24   Calcium 8.9 - 10.3 mg/dL 5.46) ) 2.7(O)  Total Protein 6.5 - 8.1 g/dL - - -  Total Bilirubin 0.3 - 1.2 mg/dL - - -  Alkaline Phos 38 - 126 U/L - - -  AST 15 - 41 U/L - - -  ALT 0 - 44 U/L - - -   CBC Latest Ref Rng & Units 10/09/2019 10/04/2019 08/19/2019  WBC 4.0 - 10.5 K/uL 7.0 9.2 8.4  Hemoglobin 12.0 - 15.0 g/dL 10.8(L) 11.4(L) 10.9(L)  Hematocrit 36 - 46 % 33.1(L) 35.1(L) 34.8(L)  Platelets 150 - 400 K/uL 353 365 270    RADS: n/a Assessment:   N/V. S/p EGD.  No acute surgical issues.  Surgery will sign off.

## 2019-10-12 DIAGNOSIS — K2990 Gastroduodenitis, unspecified, without bleeding: Secondary | ICD-10-CM | POA: Diagnosis not present

## 2019-10-12 DIAGNOSIS — K2970 Gastritis, unspecified, without bleeding: Secondary | ICD-10-CM | POA: Diagnosis not present

## 2019-10-12 DIAGNOSIS — B0089 Other herpesviral infection: Secondary | ICD-10-CM | POA: Diagnosis not present

## 2019-10-12 DIAGNOSIS — K208 Other esophagitis without bleeding: Secondary | ICD-10-CM | POA: Diagnosis not present

## 2019-10-12 LAB — HELPER T-LYMPH-CD4 (ARMC ONLY)
% CD 4 Pos. Lymph.: 43.9 % (ref 30.8–58.5)
Absolute CD 4 Helper: 702 /uL (ref 359–1519)
Basophils Absolute: 0 10*3/uL (ref 0.0–0.2)
Basos: 1 %
EOS (ABSOLUTE): 0.2 10*3/uL (ref 0.0–0.4)
Eos: 3 %
Hematocrit: 29.8 % — ABNORMAL LOW (ref 34.0–46.6)
Hemoglobin: 9.9 g/dL — ABNORMAL LOW (ref 11.1–15.9)
Immature Grans (Abs): 0.1 10*3/uL (ref 0.0–0.1)
Immature Granulocytes: 2 %
Lymphocytes Absolute: 1.6 10*3/uL (ref 0.7–3.1)
Lymphs: 29 %
MCH: 27.3 pg (ref 26.6–33.0)
MCHC: 33.2 g/dL (ref 31.5–35.7)
MCV: 82 fL (ref 79–97)
Monocytes Absolute: 0.3 10*3/uL (ref 0.1–0.9)
Monocytes: 5 %
Neutrophils Absolute: 3.3 10*3/uL (ref 1.4–7.0)
Neutrophils: 60 %
Platelets: 275 10*3/uL (ref 150–450)
RBC: 3.63 x10E6/uL — ABNORMAL LOW (ref 3.77–5.28)
RDW: 15.1 % (ref 11.7–15.4)
WBC: 5.5 10*3/uL (ref 3.4–10.8)

## 2019-10-12 LAB — MAGNESIUM: Magnesium: 2.2 mg/dL (ref 1.7–2.4)

## 2019-10-12 LAB — BASIC METABOLIC PANEL
Anion gap: 9 (ref 5–15)
BUN: <5 mg/dL — ABNORMAL LOW (ref 8–23)
CO2: 25 mmol/L (ref 22–32)
Calcium: 8.5 mg/dL — ABNORMAL LOW (ref 8.9–10.3)
Chloride: 109 mmol/L (ref 98–111)
Creatinine, Ser: 0.54 mg/dL (ref 0.44–1.00)
GFR calc Af Amer: >60 mL/min (ref >60)
GFR calc non Af Amer: >60 mL/min (ref >60)
Glucose, Bld: 106 mg/dL — ABNORMAL HIGH (ref 70–99)
Potassium: 3.8 mmol/L (ref 3.5–5.1)
Sodium: 143 mmol/L (ref 135–145)

## 2019-10-12 LAB — SURGICAL PATHOLOGY

## 2019-10-12 MED ORDER — ENOXAPARIN SODIUM 40 MG/0.4ML ~~LOC~~ SOLN
40.0000 mg | Freq: Two times a day (BID) | SUBCUTANEOUS | Status: DC
  Administered 2019-10-12 – 2019-10-16 (×9): 40 mg via SUBCUTANEOUS
  Filled 2019-10-12 (×8): qty 0.4

## 2019-10-12 NOTE — Progress Notes (Signed)
Pharmacy Antibiotic Note  Mariah Jimenez is a 74 y.o. female admitted on 10/04/2019 with herpes esophagitis as revealed by biopsy with accompanying white lesions in the esophagus.  Pharmacy has been consulted for acyclovir dosing.  ID is consulted.  Plan: acyclovir 500 mg IV Q8 hours (~5 mg/kg, wt=129 kg).  See ID note.  Pharmacy will follow renal function and continue to adjust per protocol.  Height: 5\' 3"  (160 cm) Weight: 129.1 kg (284 lb 9.8 oz) IBW/kg (Calculated) : 52.4  Temp (24hrs), Avg:98.2 F (36.8 C), Min:97.5 F (36.4 C), Max:99.2 F (37.3 C)  Recent Labs  Lab 10/07/19 0421 10/09/19 0503 10/10/19 0519 10/11/19 0605 10/12/19 0434  WBC  --  7.0  --   --   --   CREATININE 0.61 0.54 0.61 0.71 0.54    Estimated Creatinine Clearance: 83.4 mL/min (by C-G formula based on SCr of 0.54 mg/dL).    No Known Allergies  Antimicrobials this admission: Fosfomycin 6/24 x 1 Ceftriaxone 6/24 > 6/26 Augmentin 6/27 > 6/28 Acyclovir 7/1 >  Dose adjustments this admission: None  Microbiology results: 6/24 Ucx: multiple species present 6/30 surgical pathology: HSV esophagitis  Thank you for allowing pharmacy to be a part of this patient's care.  Aneyah Lortz A, PharmD 10/12/2019 9:34 AM

## 2019-10-12 NOTE — Consult Note (Signed)
PHARMACY CONSULT NOTE - FOLLOW UP  Pharmacy Consult for Electrolyte Monitoring and Replacement   Recent Labs: Potassium (mmol/L)  Date Value  10/12/2019 3.8   Magnesium (mg/dL)  Date Value  24/49/7530 2.2   Calcium (mg/dL)  Date Value  08/20/209 8.5 (L)   Albumin (g/dL)  Date Value  17/35/6701 2.3 (L)   Sodium (mmol/L)  Date Value  10/12/2019 143     Assessment: Pharmacy consulted to manage potassium and magnesium in this 75 year old female admitted with nausea and vomiting.  She currently is tolerating small amounts of PO, and has not vomited since yesterday. Note: HSV esophagitis, Chronic gastritis, dysphagia 1 diet  Goal of Therapy:  Electrolytes WNL  Plan:  7/2 @0434   K 3.8 Mag 2.2 Scr 0.54  -no supplementation at this time -will f/u electrolytes in am   ,PharmD Clinical Pharmacist 10/12/2019 9:03 AM

## 2019-10-12 NOTE — Progress Notes (Signed)
Melodie Bouillon, MD [address], [place], [place], [address] [address], [place], [place], [address] Phone: [phone]  Fax: [phone]   Subjective: Patient eating well this morning.  No nausea or vomiting.  Sister at bedside   Objective: Exam: Vital signs in last 24 hours: Vitals:   10/11/19 1942 10/12/19 0156 10/12/19 0611 10/12/19 0836  BP: (!) 100/51 122/85 (!) 122/57 (!) 87/62  Pulse: 92 93 95 94  Resp: 16 16 20 16   Temp: 99.2 F (37.3 C) 97.6 F (36.4 C) (!) 97.5 F (36.4 C) 98.2 F (36.8 C)  TempSrc: Axillary Oral Oral   SpO2: 96% 100% 97% 98%  Weight: 129.1 kg     Height:       Weight change:   Intake/Output Summary (Last 24 hours) at 10/12/2019 1048 Last data filed at 10/12/2019 0300 Gross per 24 hour  Intake 2086.88 ml  Output 450 ml  Net 1636.88 ml    General: No acute distress,  Abd: Soft, NT/ND, No HSM Skin: Warm, no rashes Neck: Supple, Trachea midline   Lab Results: Lab Results  Component Value Date   WBC 5.5 10/11/2019   HGB 9.9 (L) 10/11/2019   HCT 29.8 (L) 10/11/2019   MCV 82 10/11/2019   PLT 275 10/11/2019   Micro Results: Recent Results (from the past 240 hour(s))  Urine culture     Status: Abnormal   Collection Time: 10/04/19 12:44 PM   Specimen: Urine, Random  Result Value Ref Range Status   Specimen Description   Final    URINE, RANDOM Performed at [facility], [address], [place], [place]    Special Requests   Final    NONE Performed at [facility], [address] [place], [place]    Culture (A)  Final    >=100,000 COLONIES/mL MULTIPLE SPECIES PRESENT, SUGGEST RECOLLECTION   Report Status 10/06/2019 FINAL  Final  SARS Coronavirus 2 by RT PCR (hospital order, performed in [facility] hospital lab) Nasopharyngeal Nasopharyngeal Swab     Status: None   Collection Time: 10/04/19  8:41 PM   Specimen: Nasopharyngeal Swab  Result Value Ref Range  Status   SARS Coronavirus 2 NEGATIVE NEGATIVE Final    Comment: (NOTE) SARS-CoV-2 target nucleic acids are NOT DETECTED.  The SARS-CoV-2 RNA is generally detectable in upper and lower respiratory specimens during the acute phase of infection. The lowest concentration of SARS-CoV-2 viral copies this assay can detect is 250 copies / mL. A negative result does not preclude SARS-CoV-2 infection and should not be used as the sole basis for treatment or other patient management decisions.  A negative result may occur with improper specimen collection / handling, submission of specimen other than nasopharyngeal swab, presence of viral mutation(s) within the areas targeted by this assay, and inadequate number of viral copies (<250 copies / mL). A negative result must be combined with clinical observations, patient history, and epidemiological information.  Fact Sheet for Patients:   10/06/19  Fact Sheet for Healthcare Providers: BoilerBrush.com.cy  This test is not yet approved or  cleared by the https://pope.com/ FDA and has been authorized for detection and/or diagnosis of SARS-CoV-2 by FDA under an Emergency Use Authorization (EUA).  This EUA will remain in effect (meaning this test can be used) for the duration of the COVID-19 declaration under Section 564(b)(1) of the Act, 21 U.S.C. section 360bbb-3(b)(1), unless the authorization is terminated or revoked sooner.  Performed at  [facility], [address], [place], [place] [address]    Studies/Results: No results found. Medications:  Scheduled Meds: . enoxaparin (LOVENOX) injection  40 mg Subcutaneous Q12H  . feeding supplement  1 Container Oral TID BM  . levETIRAcetam  1,500 mg Oral BID  . loratadine  10 mg Oral Daily  . metoCLOPramide  5 mg Oral TID AC & HS  . multivitamin with minerals  1 tablet Oral Daily  . pantoprazole sodium  40 mg Per Tube BID  .  phenobarbital  48.6 mg Oral TID  . phenytoin  300 mg Oral QHS  . sodium chloride flush  10-40 mL Intracatheter Q12H   Continuous Infusions: . acyclovir 500 mg (10/12/19 6568)  . dextrose 5% lactated ringers 50 mL/hr at 10/11/19 1019   PRN Meds:.acetaminophen **OR** acetaminophen, ondansetron **OR** ondansetron (ZOFRAN) IV, sodium chloride flush   Assessment: Principal Problem:   Intractable nausea and vomiting Active Problems:   Seizure disorder (HCC)   UTI (urinary tract infection)   Intellectual disability   Pressure injury of skin   Dehydration   Class 3 obesity   Gastric polyp    Plan: Treatment for HSV esophagitis as per ID H. pylori negative on gastric biopsies GI service will sign off at this time Please page with any questions or concerns   LOS: 7 days   Melodie Bouillon, MD 10/12/2019, 10:48 AM

## 2019-10-12 NOTE — Anesthesia Postprocedure Evaluation (Signed)
Anesthesia Post Note  Patient: Neomi Laidler  Procedure(s) Performed: ESOPHAGOGASTRODUODENOSCOPY (EGD) WITH PROPOFOL (N/A )  Patient location during evaluation: Endoscopy Anesthesia Type: General Level of consciousness: awake and alert Pain management: pain level controlled Vital Signs Assessment: post-procedure vital signs reviewed and stable Respiratory status: spontaneous breathing, nonlabored ventilation, respiratory function stable and patient connected to nasal cannula oxygen Cardiovascular status: blood pressure returned to baseline and stable Postop Assessment: no apparent nausea or vomiting Anesthetic complications: no   No complications documented.   Last Vitals:  Vitals:   10/11/19 1942 10/12/19 0156  BP: (!) 100/51 122/85  Pulse: 92 93  Resp: 16 16  Temp: 37.3 C 36.4 C  SpO2: 96% 100%    Last Pain:  Vitals:   10/12/19 0156  TempSrc: Oral  PainSc:                  Lenard Simmer

## 2019-10-12 NOTE — Progress Notes (Signed)
Anticoagulation monitoring(Lovenox):  73yo  F ordered Lovenox 40 mg Q24h  Filed Weights   10/04/19 1239 10/09/19 1033 10/11/19 1942  Weight: 135 kg (297 lb 9.9 oz) 61.4 kg (135 lb 4.4 oz) 129.1 kg (284 lb 9.8 oz)   BMI 50   Lab Results  Component Value Date   CREATININE 0.54 10/12/2019   CREATININE 0.71 10/11/2019   CREATININE 0.61 10/10/2019   Estimated Creatinine Clearance: 83.4 mL/min (by C-G formula based on SCr of 0.54 mg/dL). Hemoglobin & Hematocrit     Component Value Date/Time   HGB 10.8 (L) 10/09/2019 0503   HCT 33.1 (L) 10/09/2019 0503     Per Protocol for Patient with estCrcl > 30 ml/min and BMI > 40, will transition to Lovenox 40 mg Q12h.     Bari Mantis PharmD Clinical Pharmacist 10/12/2019

## 2019-10-12 NOTE — Progress Notes (Signed)
ID Pt awake and more alert No distress Apparently ate bettertoday Swallowing better Patient Vitals for the past 24 hrs:  BP Temp Temp src Pulse Resp SpO2 Weight  10/12/19 1151 132/71 98.3 F (36.8 C) -- 83 16 98 % --  10/12/19 0836 (!) 87/62 98.2 F (36.8 C) -- 94 16 98 % --  10/12/19 0611 (!) 122/57 (!) 97.5 F (36.4 C) Oral 95 20 97 % --  10/12/19 0156 122/85 97.6 F (36.4 C) Oral 93 16 100 % --  10/11/19 1942 (!) 100/51 99.2 F (37.3 C) Axillary 92 16 96 % 129.1 kg  10/11/19 1611 103/60 -- -- 93 17 96 % --   Chest b/l air entry Hss1s2 Abd soft  Labs CBC Latest Ref Rng & Units 10/11/2019 10/09/2019 10/04/2019  WBC 3.4 - 10.8 x10E3/uL 5.5 7.0 9.2  Hemoglobin 11.1 - 15.9 g/dL 1.7(G) 10.8(L) 11.4(L)  Hematocrit 34.0 - 46.6 % 29.8(L) 33.1(L) 35.1(L)  Platelets 150 - 450 x10E3/uL 275 353 365    CMP Latest Ref Rng & Units 10/12/2019 10/11/2019 10/10/2019  Glucose 70 - 99 mg/dL 017(C) 95 944(H)  BUN 8 - 23 mg/dL <6(P) 6(L) 7(L)  Creatinine 0.44 - 1.00 mg/dL 5.91 6.38 4.66  Sodium 135 - 145 mmol/L 143 143 144  Potassium 3.5 - 5.1 mmol/L 3.8 3.3(L) 3.5  Chloride 98 - 111 mmol/L 109 110 109  CO2 22 - 32 mmol/L 25 26 26   Calcium 8.9 - 10.3 mg/dL ) 5.9(D) 3.5(T)  Total Protein 6.5 - 8.1 g/dL - 7.0 -  Total Bilirubin 0.3 - 1.2 mg/dL - 0.5 -  Alkaline Phos 38 - 126 U/L - 153(H) -  AST 15 - 41 U/L - 19 -  ALT 0 - 44 U/L - 18 -    Impression/recommendation Herpes esophagitis  causing dysphagia/odynophagia as indicated by inability to eat  And  vomiting  On Iv acyclovir 500mg  IV Q8. Will need until 10/20/19. Once able to swallow better and also the gastric distension/SB distension has resolved can be switched to Po acyclovir 400mg  Three times a day. Follow creatinine when on acyclovir No evidence of immune compromised state except for her neurodisability and being bed bound HIV neg cd4 cells normal at 702 ID will sign off  Call if needed

## 2019-10-12 NOTE — Progress Notes (Signed)
PROGRESS NOTE    Mariah Jimenez  TOI:[id number]  DOB: 04-08-1946  DOA: 10/04/2019 PCP: Randell Loop, [facility]:   Hospital course:  74 year old female with intellectual disability who lives in a nursing home and seizure disorder was admitted 10/04/2019 with voluminous vomiting.  Patient was treated with NG tube which revealed a very large amount of material which was decompressed.  Patient was seen by general surgery who did not feel that volvulus was likely.  She was seen by GI for possible gastric outlet obstruction and underwent EGD on 10/10/2019.  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  Subjective:  Patient is much more awake and alert today.  She does try to speak although not very understandably.  Patient's sister at bedside also agrees that she is more alert.  She also notes that she has been eating better and is without any further vomiting.  Objective: Vitals:   10/12/19 0611 10/12/19 0836 10/12/19 1151 10/12/19 1730  BP: (!) 122/57 (!) 87/62 132/71 (!) 144/88  Pulse: 95 94 83 94  Resp: 20 16 16 18   Temp: (!) 97.5 F (36.4 C) 98.2 F (36.8 C) 98.3 F (36.8 C) 98.3 F (36.8 C)  TempSrc: Oral     SpO2: 97% 98% 98% 100%  Weight:      Height:        Intake/Output Summary (Last 24 hours) at 10/12/2019 1812 Last data filed at 10/12/2019 0300 Gross per 24 hour  Intake 2086.88 ml  Output 250 ml  Net 1836.88 ml   Filed Weights   10/04/19 1239 10/09/19 1033 10/11/19 1942  Weight: 135 kg 61.4 kg 129.1 kg     Exam:  General: Obese patient with large protuberant lower lip lying in bed being fed by the nurse in no acute distress.  She is awake and alert and does follow me with her eyes but is unable to be meaningfully vocal. Eyes: sclera anicteric, conjuctiva mild injection bilaterally CVS: S1-S2, regular  Respiratory:  decreased air entry bilaterally secondary to decreased inspiratory effort, rales  at bases  GI: NABS, soft, NT  LE: No edema.  Neuro: Patient is alert.  She is moving all of her extremities without difficulty. Psych: Nonverbal patient with severe cognitive delay.  Assessment & Plan:   Herpes esophagitis Start acyclovir per pharmacy protocol. Appreciate ID consultation, will continue acyclovir until 10/20/2019 Can switch from IV to oral once her gastric distention has resolved/improved.   I am not sure if this is the cause of her profound gastroparesis/GOO.  Vomiting/gastric outlet obstruction/gastroparesis.  She has responded well to conservative management with NG tube. Is presently tolerating  p.o. without further vomiting. Will advance diet to soft solids/bland EGD done 10/10/2019 did not show any gastric outlet obstruction or other mechanical cause. She did have some gastritis. Continue IV Protonix for gastritis. Continue Reglan  UTI Status post treatment with ceftriaxone for multiple organisms seen on urine culture.  Seizure disorder.  Continue home dose of Keppra, phenobarbital and phenytoin.  Cognitive impairment/swallow dysfunction.  Patient is bedridden, nonverbal and completely dependent for all her ADLs for the past 2 years. She was evaluated by speech therapy and they were recommending dysphagia 1 diet. Patient's Sister 10/12/2019 is very attentive and is at bedside a lot.  Stage II sacral ulcer, present on admission. Continue local wound care.  Hypokalemia/hypomagnesemia Appreciate pharmacy assistance with repletion.  Obesity class 3.  BMI is 45 Obesity complicates all levels of care both inpatient  and outpatient.   DVT prophylaxis: Lovenox Code Status: DNR Family Communication: Discussed with Sister [name] Disposition Plan:   Patient is from: SNF  Anticipated Discharge Location: SNF  Barriers to Discharge: Initiation of treatment of herpes esophagitis with acyclovir started today  Is patient medically stable for Discharge: Not  yet   Consultants:  GI  General surgery  Procedures:  EGD done 10/10/2019  Antimicrobials:  Ceftriaxone  Acyclovir   Data Reviewed:  Basic Metabolic Panel: Recent Labs  Lab 10/07/19 0421 10/09/19 0503 10/10/19 0519 10/11/19 0605 10/12/19 0434  NA 141 142 144 143 143  K 3.6 3.5 3.5 3.3* 3.8  CL 108 108 109 110 109  CO2 23 24 26 26 25   GLUCOSE 104* 99 115* 95 106*  BUN 16 8 7* 6* <5*  CREATININE 0.61 0.54 0.61 0.71 0.54  CALCIUM 8.4* 8.7* 8.8* 8.5* 8.5*  MG 2.1 1.8 1.6* 1.5* 2.2   Liver Function Tests: Recent Labs  Lab 10/11/19 0605  AST 19  ALT 18  ALKPHOS 153*  BILITOT 0.5  PROT 7.0  ALBUMIN 2.3*   No results for input(s): LIPASE, AMYLASE in the last 168 hours. No results for input(s): AMMONIA in the last 168 hours. CBC: Recent Labs  Lab 10/09/19 0503 10/11/19 1238  WBC 7.0 5.5  NEUTROABS  --  3.3  HGB 10.8* 9.9*  HCT 33.1* 29.8*  MCV 83.0 82  PLT 353 275   Cardiac Enzymes: No results for input(s): CKTOTAL, CKMB, CKMBINDEX, TROPONINI in the last 168 hours. BNP (last 3 results) No results for input(s): PROBNP in the last 8760 hours. CBG: No results for input(s): GLUCAP in the last 168 hours.  Recent Results (from the past 240 hour(s))  Urine culture     Status: Abnormal   Collection Time: 10/04/19 12:44 PM   Specimen: Urine, Random  Result Value Ref Range Status   Specimen Description   Final    URINE, RANDOM Performed at [facility], [address], [place], [place]    Special Requests   Final    NONE Performed at [facility], [address] [place], [place]    Culture (A)  Final    >=100,000 COLONIES/mL MULTIPLE SPECIES PRESENT, SUGGEST RECOLLECTION   Report Status 10/06/2019 FINAL  Final  SARS Coronavirus 2 by RT PCR (hospital order, performed in [facility] hospital lab) Nasopharyngeal Nasopharyngeal Swab     Status: None   Collection Time: 10/04/19  8:41 PM   Specimen:  Nasopharyngeal Swab  Result Value Ref Range Status   SARS Coronavirus 2 NEGATIVE NEGATIVE Final    Comment: (NOTE) SARS-CoV-2 target nucleic acids are NOT DETECTED.  The SARS-CoV-2 RNA is generally detectable in upper and lower respiratory specimens during the acute phase of infection. The lowest concentration of SARS-CoV-2 viral copies this assay can detect is 250 copies / mL. A negative result does not preclude SARS-CoV-2 infection and should not be used as the sole basis for treatment or other patient management decisions.  A negative result may occur with improper specimen collection / handling, submission of specimen other than nasopharyngeal swab, presence of viral mutation(s) within the areas targeted by this assay, and inadequate number of viral copies (<250 copies / mL). A negative result must be combined with clinical observations, patient history, and epidemiological information.  Fact Sheet for Patients:   10/06/19  Fact Sheet for Healthcare Providers: BoilerBrush.com.cy  This test is not yet approved or  cleared by the https://pope.com/  States FDA and has been authorized for detection and/or diagnosis of SARS-CoV-2 by FDA under an Emergency Use Authorization (EUA).  This EUA will remain in effect (meaning this test can be used) for the duration of the COVID-19 declaration under Section 564(b)(1) of the Act, 21 U.S.C. section 360bbb-3(b)(1), unless the authorization is terminated or revoked sooner.  Performed at [facility], [address] [place], [place] [address]       Studies: No results found.   Scheduled Meds:  enoxaparin (LOVENOX) injection  40 mg Subcutaneous Q12H   feeding supplement  1 Container Oral TID BM   levETIRAcetam  1,500 mg Oral BID   loratadine  10 mg Oral Daily   metoCLOPramide  5 mg Oral TID AC & HS   multivitamin with minerals  1 tablet Oral Daily   pantoprazole sodium   40 mg Per Tube BID   phenobarbital  48.6 mg Oral TID   phenytoin  300 mg Oral QHS   sodium chloride flush  10-40 mL Intracatheter Q12H   Continuous Infusions:  acyclovir 500 mg (10/12/19 1420)   dextrose 5% lactated ringers 50 mL/hr at 10/12/19 1419    Principal Problem:   Intractable nausea and vomiting Active Problems:   Seizure disorder (HCC)   UTI (urinary tract infection)   Intellectual disability   Pressure injury of skin   Dehydration   Class 3 obesity   Gastric polyp     Marvens Hollars Orma Flaming, [facility]  If 7PM-7AM, please contact night-coverage [email or web address] Password [email or web address] 10/12/2019, 6:12 PM    LOS: 7 days

## 2019-10-13 DIAGNOSIS — K208 Other esophagitis without bleeding: Secondary | ICD-10-CM | POA: Diagnosis not present

## 2019-10-13 DIAGNOSIS — B0089 Other herpesviral infection: Secondary | ICD-10-CM | POA: Diagnosis not present

## 2019-10-13 LAB — MAGNESIUM: Magnesium: 1.7 mg/dL (ref 1.7–2.4)

## 2019-10-13 LAB — BASIC METABOLIC PANEL
Anion gap: 9 (ref 5–15)
BUN: <5 mg/dL — ABNORMAL LOW (ref 8–23)
CO2: 27 mmol/L (ref 22–32)
Calcium: 8.4 mg/dL — ABNORMAL LOW (ref 8.9–10.3)
Chloride: 105 mmol/L (ref 98–111)
Creatinine, Ser: 0.51 mg/dL (ref 0.44–1.00)
GFR calc Af Amer: >60 mL/min (ref >60)
GFR calc non Af Amer: >60 mL/min (ref >60)
Glucose, Bld: 107 mg/dL — ABNORMAL HIGH (ref 70–99)
Potassium: 3.6 mmol/L (ref 3.5–5.1)
Sodium: 141 mmol/L (ref 135–145)

## 2019-10-13 MED ORDER — MAGNESIUM SULFATE 2 GM/50ML IV SOLN
2.0000 g | Freq: Once | INTRAVENOUS | Status: AC
  Administered 2019-10-13: 2 g via INTRAVENOUS
  Filled 2019-10-13: qty 50

## 2019-10-13 NOTE — Consult Note (Signed)
PHARMACY CONSULT NOTE - FOLLOW UP  Pharmacy Consult for Electrolyte Monitoring and Replacement   Recent Labs: Potassium (mmol/L)  Date Value  10/13/2019 3.6   Magnesium (mg/dL)  Date Value  13/11/6576 1.7   Calcium (mg/dL)  Date Value  46/96/2952 8.4 (L)   Albumin (g/dL)  Date Value  84/13/2440 2.3 (L)   Sodium (mmol/L)  Date Value  10/13/2019 141     Assessment: Pharmacy consulted to manage potassium and magnesium in this 74 year old female admitted with nausea and vomiting.  She currently is tolerating small amounts of PO, and has not vomited since yesterday. Note: HSV esophagitis, Chronic gastritis, dysphagia 1 diet  Goal of Therapy:  Electrolytes WNL  Plan:  Will order Mg 2g IV x 1.   -will f/u electrolytes in am   Ronnald Ramp ,PharmD, BCPS Clinical Pharmacist 10/13/2019 8:45 AM
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
## 2019-10-13 NOTE — Progress Notes (Signed)
PROGRESS NOTE    Mariah Jimenez  QQI:[id number]  DOB: January 12, 1946  DOA: 10/04/2019 PCP: Randell Loop, [facility]:   Hospital course:  74 year old female with intellectual disability who lives in a nursing home and seizure disorder was admitted 10/04/2019 with voluminous vomiting.  Patient was treated with NG tube which revealed a very large amount of material which was decompressed.  Patient was seen by general surgery who did not feel that volvulus was likely.  She was seen by GI for possible gastric outlet obstruction and underwent EGD on 10/10/2019. 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  Subjective:  Patient continues to be even more alert than yesterday.  She was try to vocalize a lot but unfortunately I was unable to understand her.  She was vocalizing and I set her up in bed and she stopped and seemed to be happier.  She did not say anything when I asked her whether she had pain in her abdomen or not.   Objective: Vitals:   10/12/19 2008 10/13/19 0418 10/13/19 0821 10/13/19 1216  BP: (!) 141/80 137/69 121/63 135/78  Pulse: 95 100 (!) 101 88  Resp: 16 16 16 18   Temp: (!) 97.3 F (36.3 C) 97.9 F (36.6 C) 98.9 F (37.2 C) 98.2 F (36.8 C)  TempSrc:    Axillary  SpO2: 100% 98% 98% 99%  Weight:      Height:        Intake/Output Summary (Last 24 hours) at 10/13/2019 1229 Last data filed at 10/13/2019 0300 Gross per 24 hour  Intake 240 ml  Output 1600 ml  Net -1360 ml   Filed Weights   10/04/19 1239 10/09/19 1033 10/11/19 1942  Weight: 135 kg 61.4 kg 129.1 kg     Exam:  General: Obese patient with large protuberant lower lip lying in bed vocalizing loudly but not meaningfully.  She does not appear to be in any distress.   Eyes: sclera anicteric, conjuctiva mild injection bilaterally CVS: S1-S2, regular  Respiratory:  decreased air entry bilaterally secondary to difficulty  with auscultation given body habitus. GI: NABS, firm but not hard, less distended than yesterday. LE: No edema.  Neuro: Patient is alert.  She is moving all of her extremities without difficulty. Psych: Nonverbal patient with severe cognitive delay.  Assessment & Plan:   Vomiting/gastric outlet obstruction/gastroparesis.  She has responded well to conservative management with NG tube. Is presently tolerating  p.o. without further vomiting. Will advance diet to soft solids/bland If patient tolerates bland diet today can consider changing from IV acyclovir to p.o. prior to discharge. EGD done 10/10/2019 did not show any gastric outlet obstruction or other mechanical cause. She did have some gastritis. Continue IV Protonix for gastritis. Continue Reglan  Herpes esophagitis Start acyclovir per pharmacy protocol. Appreciate ID consultation, will continue acyclovir until 10/20/2019 Can switch from IV to oral once her gastric distention has resolved/improved.   I am not sure if this is the cause of her profound gastroparesis/GOO.  UTI Status post treatment with ceftriaxone for multiple organisms seen on urine culture.  Seizure disorder.  Continue home dose of Keppra, phenobarbital and phenytoin.  Cognitive impairment/swallow dysfunction.  Patient is bedridden, nonverbal and completely dependent for all her ADLs for the past 2 years. She was evaluated by speech therapy and they were recommending dysphagia 1 diet. Patient's Sister 12/21/2019 is very attentive and is at bedside a lot.  Stage II sacral ulcer, present  on admission. Continue local wound care.  Hypokalemia/hypomagnesemia Appreciate pharmacy assistance with repletion.  Obesity class 3.  BMI is 45 Obesity complicates all levels of care both inpatient and outpatient.   DVT prophylaxis: Lovenox Code Status: DNR Family Communication: Discussed with Sister [name] Disposition Plan:   Patient is from: SNF  Anticipated  Discharge Location: SNF  Barriers to Discharge: Initiation of treatment of herpes esophagitis with acyclovir started today  Is patient medically stable for Discharge: Not yet   Consultants:  GI  General surgery  Procedures:  EGD done 10/10/2019  Antimicrobials:  Ceftriaxone  Acyclovir   Data Reviewed:  Basic Metabolic Panel: Recent Labs  Lab 10/09/19 0503 10/10/19 0519 10/11/19 0605 10/12/19 0434 10/13/19 0522  NA 142 144 143 143 141  K 3.5 3.5 3.3* 3.8 3.6  CL 108 109 110 109 105  CO2 24 26 26 25 27   GLUCOSE 99 115* 95 106* 107*  BUN 8 7* 6* <5* <5*  CREATININE 0.54 0.61 0.71 0.54 0.51  CALCIUM 8.7* 8.8* 8.5* 8.5* 8.4*  MG 1.8 1.6* 1.5* 2.2 1.7   Liver Function Tests: Recent Labs  Lab 10/11/19 0605  AST 19  ALT 18  ALKPHOS 153*  BILITOT 0.5  PROT 7.0  ALBUMIN 2.3*   No results for input(s): LIPASE, AMYLASE in the last 168 hours. No results for input(s): AMMONIA in the last 168 hours. CBC: Recent Labs  Lab 10/09/19 0503 10/11/19 1238  WBC 7.0 5.5  NEUTROABS  --  3.3  HGB 10.8* 9.9*  HCT 33.1* 29.8*  MCV 83.0 82  PLT 353 275   Cardiac Enzymes: No results for input(s): CKTOTAL, CKMB, CKMBINDEX, TROPONINI in the last 168 hours. BNP (last 3 results) No results for input(s): PROBNP in the last 8760 hours. CBG: No results for input(s): GLUCAP in the last 168 hours.  Recent Results (from the past 240 hour(s))  Urine culture     Status: Abnormal   Collection Time: 10/04/19 12:44 PM   Specimen: Urine, Random  Result Value Ref Range Status   Specimen Description   Final    URINE, RANDOM Performed at [facility], [address], [place], [place]    Special Requests   Final    NONE Performed at [facility], [address] [place], [place]    Culture (A)  Final    >=100,000 COLONIES/mL MULTIPLE SPECIES PRESENT, SUGGEST RECOLLECTION   Report Status 10/06/2019 FINAL  Final  SARS Coronavirus 2 by RT  PCR (hospital order, performed in [facility] hospital lab) Nasopharyngeal Nasopharyngeal Swab     Status: None   Collection Time: 10/04/19  8:41 PM   Specimen: Nasopharyngeal Swab  Result Value Ref Range Status   SARS Coronavirus 2 NEGATIVE NEGATIVE Final    Comment: (NOTE) SARS-CoV-2 target nucleic acids are NOT DETECTED.  The SARS-CoV-2 RNA is generally detectable in upper and lower respiratory specimens during the acute phase of infection. The lowest concentration of SARS-CoV-2 viral copies this assay can detect is 250 copies / mL. A negative result does not preclude SARS-CoV-2 infection and should not be used as the sole basis for treatment or other patient management decisions.  A negative result may occur with improper specimen collection / handling, submission of specimen other than nasopharyngeal swab, presence of viral mutation(s) within the areas targeted by this assay, and inadequate number of viral copies (<250 copies / mL). A negative result must be combined with clinical observations, patient history, and epidemiological  information.  Fact Sheet for Patients:   BoilerBrush.com.cy  Fact Sheet for Healthcare Providers: https://pope.com/  This test is not yet approved or  cleared by the [place] FDA and has been authorized for detection and/or diagnosis of SARS-CoV-2 by FDA under an Emergency Use Authorization (EUA).  This EUA will remain in effect (meaning this test can be used) for the duration of the COVID-19 declaration under Section 564(b)(1) of the Act, 21 U.S.C. section 360bbb-3(b)(1), unless the authorization is terminated or revoked sooner.  Performed at [facility], [address] [place], [place] [address]       Studies: No results found.   Scheduled Meds: . enoxaparin (LOVENOX) injection  40 mg Subcutaneous Q12H  . feeding supplement  1 Container Oral TID BM  . levETIRAcetam  1,500  mg Oral BID  . loratadine  10 mg Oral Daily  . metoCLOPramide  5 mg Oral TID AC & HS  . multivitamin with minerals  1 tablet Oral Daily  . pantoprazole sodium  40 mg Per Tube BID  . phenobarbital  48.6 mg Oral TID  . phenytoin  300 mg Oral QHS  . sodium chloride flush  10-40 mL Intracatheter Q12H   Continuous Infusions: . acyclovir 500 mg (10/13/19 0530)  . dextrose 5% lactated ringers 50 mL/hr at 10/12/19 1419  . magnesium sulfate bolus IVPB 2 g (10/13/19 1144)    Principal Problem:   Herpes simplex esophagitis Active Problems:   Seizure disorder (HCC)   UTI (urinary tract infection)   Intractable nausea and vomiting   Intellectual disability   Pressure injury of skin   Dehydration   Class 3 obesity   Gastric polyp     Keylen Eckenrode Orma Flaming, [facility]  If 7PM-7AM, please contact night-coverage [email or web address] Password [email or web address] 10/13/2019, 12:29 PM    LOS: 8 days

## 2019-10-14 DIAGNOSIS — B0089 Other herpesviral infection: Secondary | ICD-10-CM | POA: Diagnosis not present

## 2019-10-14 DIAGNOSIS — K208 Other esophagitis without bleeding: Secondary | ICD-10-CM | POA: Diagnosis not present

## 2019-10-14 LAB — BASIC METABOLIC PANEL
Anion gap: 8 (ref 5–15)
BUN: 7 mg/dL — ABNORMAL LOW (ref 8–23)
CO2: 29 mmol/L (ref 22–32)
Calcium: 8.5 mg/dL — ABNORMAL LOW (ref 8.9–10.3)
Chloride: 106 mmol/L (ref 98–111)
Creatinine, Ser: 0.59 mg/dL (ref 0.44–1.00)
GFR calc Af Amer: >60 mL/min (ref >60)
GFR calc non Af Amer: >60 mL/min (ref >60)
Glucose, Bld: 96 mg/dL (ref 70–99)
Potassium: 3.8 mmol/L (ref 3.5–5.1)
Sodium: 143 mmol/L (ref 135–145)

## 2019-10-14 LAB — MAGNESIUM: Magnesium: 1.9 mg/dL (ref 1.7–2.4)

## 2019-10-14 MED ORDER — ACYCLOVIR 200 MG PO CAPS
400.0000 mg | ORAL_CAPSULE | Freq: Three times a day (TID) | ORAL | Status: DC
  Administered 2019-10-14 – 2019-10-15 (×3): 400 mg via ORAL
  Filled 2019-10-14 (×5): qty 2

## 2019-10-14 MED ORDER — PANTOPRAZOLE SODIUM 40 MG PO TBEC
40.0000 mg | DELAYED_RELEASE_TABLET | Freq: Every day | ORAL | Status: DC
  Administered 2019-10-15 – 2019-10-16 (×2): 40 mg via ORAL
  Filled 2019-10-14 (×2): qty 1

## 2019-10-14 NOTE — Plan of Care (Signed)
  Problem: Clinical Measurements: Goal: Ability to maintain clinical measurements within normal limits will improve Outcome: Progressing Goal: Respiratory complications will improve Outcome: Progressing   Problem: Nutrition: Goal: Adequate nutrition will be maintained Outcome: Progressing   Problem: Coping: Goal: Level of anxiety will decrease Outcome: Progressing   Problem: Safety: Goal: Ability to remain free from injury will improve Outcome: Progressing   Problem: Skin Integrity: Goal: Risk for impaired skin integrity will decrease Outcome: Progressing

## 2019-10-14 NOTE — Progress Notes (Signed)
PROGRESS NOTE    Mariah Jimenez  MVE:[id number]  DOB: [birth year]  DOA: 10/04/2019 PCP: Randell Loop, [facility]:   Hospital course:  74 year old female with intellectual disability who lives in a nursing home and seizure disorder was admitted 10/04/2019 with voluminous vomiting.  Patient was treated with NG tube which revealed a very large amount of material which was decompressed.  Patient was seen by general surgery who did not feel that volvulus was likely.  She was seen by GI for possible gastric outlet obstruction and underwent EGD on 10/10/2019. 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  Subjective:  Patient appears much improved.  Her niece was at bedside holding FaceTime phone in front of her face.  Patient was laughing and appeared to be enjoying communicating with the person on the other side.  Niece feels that patient is back to baseline with good p.o. intake.  Patient does admit that she had diarrhea yesterday but does not think she has had any today as far as I can tell with her nodding.  Discussed with nurses who noted to voluminous episodes of loose stool but none since then.   Objective: Vitals:   10/13/19 2104 10/13/19 2346 10/14/19 0410 10/14/19 0718  BP: (!) 144/66 (!) 114/59 (!) 119/56 125/90  Pulse: (!) 107 (!) 104 (!) 107 (!) 108  Resp: 14 15 15 20   Temp: 98.1 F (36.7 C) 97.8 F (36.6 C) 98.1 F (36.7 C) 97.6 F (36.4 C)  TempSrc:      SpO2: 100% 99% 100%   Weight:      Height:        Intake/Output Summary (Last 24 hours) at 10/14/2019 1418 Last data filed at 10/14/2019 1100 Gross per 24 hour  Intake 240 ml  Output 2350 ml  Net -2110 ml   Filed Weights   10/04/19 1239 10/09/19 1033 10/11/19 1942  Weight: 135 kg 61.4 kg 129.1 kg     Exam:  General: Obese patient with large protuberant lower lip sitting up in bed eating and appearing to be laughing and  happily communicating with family over FaceTime.  Eyes: sclera anicteric, conjuctiva mild injection bilaterally CVS: S1-S2, regular  Respiratory:  decreased air entry bilaterally secondary to difficulty with auscultation given body habitus. GI: NABS, softer than yesterday, nontender to palpation. LE: No edema.  Neuro: Patient is alert.  She is moving all of her extremities without difficulty. Psych: Nonverbal patient with cognitive delay.  Assessment & Plan:   Vomiting/gastric outlet obstruction/gastroparesis.  Patient is doing well.  She is eating soft solids with gusto no further vomiting. She has had large bowel movements yesterday but none today so her gut seems to have opened up. Will switch patient over to oral acyclovir and Protonix to see how she tolerates that prior to discharge. EGD done 10/10/2019 did not show any gastric outlet obstruction or other mechanical cause. Continue Reglan  Herpes esophagitis We will switch IV acyclovir over to oral in anticipation of discharge if she tolerates it. Appreciate ID consultation, will continue acyclovir until 10/20/2019 I am not sure if this is the cause of her profound gastroparesis/GOO but clinically she has improved significantly with treatment.  UTI Status post treatment with ceftriaxone for multiple organisms seen on urine culture.  Seizure disorder.  Continue home dose of Keppra, phenobarbital and phenytoin.  Cognitive impairment/swallow dysfunction.  Patient is bedridden, nonverbal and completely dependent for all her ADLs for the past 2  years. She was evaluated by speech therapy and they were recommending dysphagia 1 diet. Patient's Sister [name] is very attentive and is at bedside a lot.  Stage II sacral ulcer, present on admission. Continue local wound care.  Hypokalemia/hypomagnesemia Appreciate pharmacy assistance with repletion.  Obesity class 3.  BMI is 45 Obesity complicates all levels of care both  inpatient and outpatient.   DVT prophylaxis: Lovenox Code Status: DNR Family Communication: Patient's niece was at bedside throughout. Disposition Plan:   Patient is from: SNF  Anticipated Discharge Location: SNF  Barriers to Discharge: Voluminous diarrhea yesterday, bowel obstruction seems to be resolving, we are switching over to p.o. acyclovir today.  Is patient medically stable for Discharge: Probably tomorrow.   Consultants:  GI  General surgery  Procedures:  EGD done 10/10/2019  Antimicrobials:  Ceftriaxone  Acyclovir   Data Reviewed:  Basic Metabolic Panel: Recent Labs  Lab 10/10/19 0519 10/11/19 0605 10/12/19 0434 10/13/19 0522 10/14/19 0527  NA 144 143 143 141 143  K 3.5 3.3* 3.8 3.6 3.8  CL 109 110 109 105 106  CO2 26 26 25 27 29   GLUCOSE 115* 95 106* 107* 96  BUN 7* 6* <5* <5* 7*  CREATININE 0.61 0.71 0.54 0.51 0.59  CALCIUM 8.8* 8.5* 8.5* 8.4* 8.5*  MG 1.6* 1.5* 2.2 1.7 1.9   Liver Function Tests: Recent Labs  Lab 10/11/19 0605  AST 19  ALT 18  ALKPHOS 153*  BILITOT 0.5  PROT 7.0  ALBUMIN 2.3*   No results for input(s): LIPASE, AMYLASE in the last 168 hours. No results for input(s): AMMONIA in the last 168 hours. CBC: Recent Labs  Lab 10/09/19 0503 10/11/19 1238  WBC 7.0 5.5  NEUTROABS  --  3.3  HGB 10.8* 9.9*  HCT 33.1* 29.8*  MCV 83.0 82  PLT 353 275   Cardiac Enzymes: No results for input(s): CKTOTAL, CKMB, CKMBINDEX, TROPONINI in the last 168 hours. BNP (last 3 results) No results for input(s): PROBNP in the last 8760 hours. CBG: No results for input(s): GLUCAP in the last 168 hours.  Recent Results (from the past 240 hour(s))  SARS Coronavirus 2 by RT PCR (hospital order, performed in [facility] hospital lab) Nasopharyngeal Nasopharyngeal Swab     Status: None   Collection Time: 10/04/19  8:41 PM   Specimen: Nasopharyngeal Swab  Result Value Ref Range Status   SARS Coronavirus 2 NEGATIVE NEGATIVE Final     Comment: (NOTE) SARS-CoV-2 target nucleic acids are NOT DETECTED.  The SARS-CoV-2 RNA is generally detectable in upper and lower respiratory specimens during the acute phase of infection. The lowest concentration of SARS-CoV-2 viral copies this assay can detect is 250 copies / mL. A negative result does not preclude SARS-CoV-2 infection and should not be used as the sole basis for treatment or other patient management decisions.  A negative result may occur with improper specimen collection / handling, submission of specimen other than nasopharyngeal swab, presence of viral mutation(s) within the areas targeted by this assay, and inadequate number of viral copies (<250 copies / mL). A negative result must be combined with clinical observations, patient history, and epidemiological information.  Fact Sheet for Patients:   10/06/19  Fact Sheet for Healthcare Providers: BoilerBrush.com.cy  This test is not yet approved or  cleared by the https://pope.com/ FDA and has been authorized for detection and/or diagnosis of SARS-CoV-2 by FDA under an Emergency Use Authorization (EUA).  This EUA will remain in effect (meaning this test  can be used) for the duration of the COVID-19 declaration under Section 564(b)(1) of the Act, 21 U.S.C. section 360bbb-3(b)(1), unless the authorization is terminated or revoked sooner.  Performed at [facility], [address], [place], [place] [address]       Studies: No results found.   Scheduled Meds: . enoxaparin (LOVENOX) injection  40 mg Subcutaneous Q12H  . feeding supplement  1 Container Oral TID BM  . levETIRAcetam  1,500 mg Oral BID  . loratadine  10 mg Oral Daily  . metoCLOPramide  5 mg Oral TID AC & HS  . multivitamin with minerals  1 tablet Oral Daily  . pantoprazole sodium  40 mg Per Tube BID  . phenobarbital  48.6 mg Oral TID  . phenytoin  300 mg Oral QHS  . sodium  chloride flush  10-40 mL Intracatheter Q12H   Continuous Infusions: . acyclovir 500 mg (10/14/19 0606)  . dextrose 5% lactated ringers 50 mL/hr at 10/13/19 1340    Principal Problem:   Herpes simplex esophagitis Active Problems:   Seizure disorder (HCC)   UTI (urinary tract infection)   Intractable nausea and vomiting   Intellectual disability   Pressure injury of skin   Dehydration   Class 3 obesity   Gastric polyp     Ashelyn Mccravy Orma Flaming, [facility]  If 7PM-7AM, please contact night-coverage [email or web address] Password [email or web address] 10/14/2019, 2:18 PM    LOS: 9 days

## 2019-10-14 NOTE — Consult Note (Signed)
PHARMACY CONSULT NOTE - FOLLOW UP  Pharmacy Consult for Electrolyte Monitoring and Replacement   Recent Labs: Potassium (mmol/L)  Date Value  10/14/2019 3.8   Magnesium (mg/dL)  Date Value  74/25/9563 1.9   Calcium (mg/dL)  Date Value  87/56/4332 8.5 (L)   Albumin (g/dL)  Date Value  95/18/8416 2.3 (L)   Sodium (mmol/L)  Date Value  10/14/2019 143     Assessment: Pharmacy consulted to manage potassium and magnesium in this 74 year old female admitted with nausea and vomiting.  She currently is tolerating small amounts of PO.  Note: HSV esophagitis, Chronic gastritis, dysphagia 1 diet  Goal of Therapy:  Electrolytes WNL  Plan:  No replacement warranted this morning.   Will F/U in AM and continue order replacement as needed.    Gardner Candle ,PharmD, BCPS Clinical Pharmacist 10/14/2019 7:03 AM

## 2019-10-14 NOTE — Progress Notes (Addendum)
SLP F/U Note  Patient Details Name: Mariah Jimenez MRN: [id number] DOB: 1946/03/19   Cancelled treatment:       Reason Eval/Treat Not Completed:  (chart reviewed; consulted NSG re: pt's status). Per chart, pt's oral diet has upgraded from clear liquids to GI Soft post EGD on 10/10/2019 (dx'd vomiting/gastric outlet obstruction/gastroparesis, Herpes esophagitis). Per NSG, pt is tolerating her oral diet w/out overt s/s of aspiration noted; wbc normal and afebrile. Pt is on room air. Pt is Edentulous at baseline so diet consistency of GI Soft diet was modified to mech soft w/ minced meats, gravies as she has been recommended previously.  NSG to reconsult if any new needs arise. Continue to recommend general aspiration precautions w/ all oral intake, pill swallowing. NSG asked about a handle/grip for utensils for pt's self-feeding - directed NSG to OT for such. NSG agreed.     Mariah Som, MS, CCC-SLP Mariah Jimenez 10/14/2019, 2:24 PM

## 2019-10-14 NOTE — Progress Notes (Signed)
Occupational Therapy Treatment Patient Details Name: Mariah Jimenez MRN: [id number] DOB: [birth year] Today's Date: 10/14/2019    History of present illness Pt. is a 74 year old female with past medical history for intellectual disability and seizures.  She reported 3 days of nausea and vomiting, not able to tolerate p.o.  Outpatient evaluation with abdominal x-ray showed possible small bowel obstruction she was sent to the hospital for further evaluation.   OT comments  Pt. Required hand over hand assist with perform gentle AAROM ROM for BUE's.  Pt. Was assisted with repositioning BUEs on pillows. Pt. Was alert, and presented  intermittent initiation. Pt. is total assist for basic ADLs. Pt. Continues to  benefit from OT services for ADL training, positioning, and pt. education/caregiver education about ADL, and UE functioning, safety, and positioning. Pt. Could benefit from LTC/SNL level of care.   Follow Up Recommendations  Home health [facility]    Equipment Recommendations       Recommendations for Other Services      Precautions / Restrictions         Mobility Bed Mobility               General bed mobility comments: deferred  Transfers                 General transfer comment: deferred. Pt. is bed bound    Balance                                           ADL either performed or assessed with clinical judgement   ADL Overall ADL's : Needs assistance/impaired                                       General ADL Comments: Total A withall ADLs, and IADLs     Vision       Perception     Praxis      Cognition Arousal/Alertness: Lethargic   Overall Cognitive Status: No family/caregiver present to determine baseline cognitive functioning                                 General Comments: Alert intermittent vocalizations        Exercises     Shoulder Instructions       General Comments       Pertinent Vitals/ Pain       Pain Assessment: No/denies pain  Home Living                                          Prior Functioning/Environment              Frequency  Min 1X/week        Progress Toward Goals  OT Goals(current goals can now be found in the care plan section)  Progress towards OT goals: Progressing toward goals  Acute Rehab OT Goals OT Goal Formulation: Patient unable to participate in goal setting Time For Goal Achievement: 10/19/19 Potential to Achieve Goals: Fair  Plan Discharge plan remains appropriate    Co-evaluation  AM-PAC OT "6 Clicks" Daily Activity     Outcome Measure   Help from another person eating meals?: Total Help from another person taking care of personal grooming?: Total Help from another person toileting, which includes using toliet, bedpan, or urinal?: Total Help from another person bathing (including washing, rinsing, drying)?: Total Help from another person to put on and taking off regular upper body clothing?: Total Help from another person to put on and taking off regular lower body clothing?: Total 6 Click Score: 6    End of Session    OT Visit Diagnosis: Adult, failure to thrive (R62.7);Feeding difficulties (R63.3)   Activity Tolerance Patient tolerated treatment well   Patient Left in bed;with bed alarm set;Other (comment)   Nurse Communication Other (comment)        Time: 2202-5427 OT Time Calculation (min): 15 min  Charges: OT General Charges $OT Visit: 1 Visit OT Treatments $Self Care/Home Management : 8-22 mins  Olegario Messier, MS, OTR/L   Olegario Messier 10/14/2019, 1:06 PM

## 2019-10-15 DIAGNOSIS — B0089 Other herpesviral infection: Secondary | ICD-10-CM | POA: Diagnosis not present

## 2019-10-15 DIAGNOSIS — K208 Other esophagitis without bleeding: Principal | ICD-10-CM

## 2019-10-15 LAB — BASIC METABOLIC PANEL
Anion gap: 8 (ref 5–15)
BUN: 7 mg/dL — ABNORMAL LOW (ref 8–23)
CO2: 29 mmol/L (ref 22–32)
Calcium: 8.6 mg/dL — ABNORMAL LOW (ref 8.9–10.3)
Chloride: 105 mmol/L (ref 98–111)
Creatinine, Ser: 0.59 mg/dL (ref 0.44–1.00)
GFR calc Af Amer: >60 mL/min (ref >60)
GFR calc non Af Amer: >60 mL/min (ref >60)
Glucose, Bld: 86 mg/dL (ref 70–99)
Potassium: 4.1 mmol/L (ref 3.5–5.1)
Sodium: 142 mmol/L (ref 135–145)

## 2019-10-15 LAB — MAGNESIUM: Magnesium: 1.8 mg/dL (ref 1.7–2.4)

## 2019-10-15 LAB — SARS CORONAVIRUS 2 BY RT PCR (HOSPITAL ORDER, PERFORMED IN ~~LOC~~ HOSPITAL LAB): SARS Coronavirus 2: NEGATIVE

## 2019-10-15 LAB — CBC
HCT: 31.8 % — ABNORMAL LOW (ref 36.0–46.0)
Hemoglobin: 10.5 g/dL — ABNORMAL LOW (ref 12.0–15.0)
MCH: 27.8 pg (ref 26.0–34.0)
MCHC: 33 g/dL (ref 30.0–36.0)
MCV: 84.1 fL (ref 80.0–100.0)
Platelets: 294 10*3/uL (ref 150–400)
RBC: 3.78 MIL/uL — ABNORMAL LOW (ref 3.87–5.11)
RDW: 17 % — ABNORMAL HIGH (ref 11.5–15.5)
WBC: 5 10*3/uL (ref 4.0–10.5)
nRBC: 0 % (ref 0.0–0.2)

## 2019-10-15 MED ORDER — MAGNESIUM SULFATE IN D5W 1-5 GM/100ML-% IV SOLN
1.0000 g | Freq: Once | INTRAVENOUS | Status: AC
  Administered 2019-10-15: 11:00:00 1 g via INTRAVENOUS
  Filled 2019-10-15: qty 100

## 2019-10-15 MED ORDER — FAMCICLOVIR 500 MG PO TABS: 250.0000 mg | ORAL_TABLET | Freq: Three times a day (TID) | ORAL | Status: DC

## 2019-10-15 MED ORDER — LORAZEPAM 2 MG/ML IJ SOLN
2.0000 mg | INTRAMUSCULAR | Status: DC | PRN
  Administered 2019-10-15: 14:00:00 2 mg via INTRAVENOUS
  Filled 2019-10-15: qty 1

## 2019-10-15 MED ORDER — ENSURE ENLIVE PO LIQD
237.0000 mL | Freq: Two times a day (BID) | ORAL | Status: DC
  Administered 2019-10-15 – 2019-10-16 (×4): 237 mL via ORAL

## 2019-10-15 MED ORDER — LORAZEPAM 2 MG/ML IJ SOLN
INTRAMUSCULAR | Status: AC
  Administered 2019-10-15: 13:00:00 2 mg
  Filled 2019-10-15: qty 1

## 2019-10-15 MED ORDER — VALACYCLOVIR HCL 500 MG PO TABS
1000.0000 mg | ORAL_TABLET | Freq: Two times a day (BID) | ORAL | Status: DC
  Filled 2019-10-15: qty 2

## 2019-10-15 MED ORDER — METOCLOPRAMIDE HCL 5 MG PO TABS: 5.0000 mg | ORAL_TABLET | Freq: Three times a day (TID) | ORAL | Status: DC | PRN

## 2019-10-15 MED ORDER — ACYCLOVIR 200 MG PO CAPS: 400.0000 mg | ORAL_CAPSULE | Freq: Three times a day (TID) | ORAL | 0 refills | Status: DC

## 2019-10-15 NOTE — TOC Progression Note (Addendum)
Transition of Care [facility]) - Progression Note    Patient Details  Name: Mariah Jimenez MRN: [id number] Date of Birth: 05/17/1945  Transition of [facility]) CM/SW Contact  Payslee Bateson, Lemar Livings, LCSW Phone Number: 10/15/2019, 10:11 AM  Clinical Narrative:   According to MD pt is medically stable to return to [facility] where she is a long term care pt. Sister in agreement for her to return today and have spoken with kelly-coordinator to inform of plan for today. She will need a new COVID test-MD has ordered and will work on transfer today.      Barriers to Discharge: No Barriers Identified  Expected Discharge Plan and Services           Expected Discharge Date: 10/07/19                                     Social Determinants of Health (SDOH) Interventions    Readmission Risk Interventions No flowsheet data found.

## 2019-10-15 NOTE — Progress Notes (Signed)
ID Pt was to be transferred to NH when she had seizures this morning. Concern for acyclovir induced neurotoxicity She does not have renal faliure She was getting acyclovir for herpes esophagitis  The dose of acyclovir 5mg /kg IV q 8was conservative for her weight of 130KG . She was only getting 500mg  IV every 8 hrs until yesterday and switched to 400mg  Po Q 8 She has h/o seizures and been on three different siezure med Dilantin Keppra Phenobarbitol She was admitted on 10/04/19 with N/V  And had  /gastric distension/and questionable gastric outlet obstruction necessitating NG tube and then endoscopy on 10/10/19  Pt had one dose of IV Levetiracetam ( keppra) 1500mg  one dose on 10/05/19 and since then on PO  She has been on PO phenobarbital since admission She received one dose of 300mg  of dilantin IV on 6/25 - since then it has been PO . With gastric distension possible gastroparesis not sure how much of the antiepleptic meds were absorbed. She also was on NG suction for 24 hrs- will recommendchecking  the levels of the antiseizure meds to make sure they are therapeutic.   Also will recommend checking another abdominal Xray to make sure there is no gastric dilatation/gastroparesis .  Will Dc acyclovir .  May   start  famciclovir 250mg  TID after getting the levels of antiseizure meds . If the level is low then we know that the seizures may be likely due to that.  Discussed the management with Dr.Amin

## 2019-10-15 NOTE — Discharge Summary (Addendum)
Physician Discharge Summary  Mariah Jimenez FAO:[id number] DOB: 1945/05/07 DOA: 10/04/2019  PCP: [facility] Loop, [facility]  Admit date: 10/04/2019 Discharge date: 10/16/2019  Admitted From: SNF Disposition:  SNF  Recommendations for Outpatient Follow-up:  1. Follow up with PCP in 1-2 weeks 2. Please obtain BMP/CBC in one week 3. Please follow up on the following pending results: Keppra and Dilantin levels.  Home Health:No Equipment/Devices: Wheelchair Discharge Condition: Stable CODE STATUS: DNR Diet recommendation:  Regular / Dysphagia   Brief/Interim Summary: 74 year old female with intellectual disability who lives in a nursing home and seizure disorder was admitted 10/04/2019 with voluminous vomiting.  Patient was treated with NG tube which revealed a very large amount of material which was decompressed.  Patient was seen by general surgery who did not feel that volvulus was likely.  She was seen by GI for possible gastric outlet obstruction and underwent EGD on 10/10/2019. 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 Patient improved clinically and now back to her baseline.  She was able to tolerate p.o. intake well.  EGD biopsies were negative for any malignancy or H. pylori.  She was initially treated with IV acyclovir which was transitioned to p.o. patient was initially discharged yesterday then she developed a seizure resulted in keeping the patient for another day.  Acyclovir can also cause seizures although less likely in patients with normal renal function.  We did send Keppra and Dilantin levels although patients were receiving them regularly while in the hospital.  Acyclovir was discontinued and she will take famciclovir for another 5 days to complete the course.  There was some concern of UTI on UA.  Unable to obtain any urinary symptoms because of her advanced cognitive impairment and being nonverbal.  She was  treated with ceftriaxone and completed the course.  Urine culture with multiple organisms.  Patient to have stage II sacral ulcers which were present on admission.  Wound care was obtained and she needs regular dressing change to prevent infection at her facility.  Patient is morbidly obese which can also guard her prognosis.  She will continue rest of her home medications.  Discharge Diagnoses:  Principal Problem:   Herpes simplex esophagitis Active Problems:   Seizure disorder (HCC)   UTI (urinary tract infection)   Intractable nausea and vomiting   Intellectual disability   Pressure injury of skin   Dehydration   Class 3 obesity   Gastric polyp   Discharge Instructions  Discharge Instructions    Diet - low sodium heart healthy   Complete by: As directed    Discharge instructions   Complete by: As directed    Follow up with primary care in 7 days.   Discharge wound care:   Complete by: As directed    Keep the pressure injury sites clean and apply gel dressings.   Discharge wound care:   Complete by: As directed    Patient will need persistent wound care for her pressure injury   Increase activity slowly   Complete by: As directed    Increase activity slowly   Complete by: As directed    Increase activity slowly   Complete by: As directed    No wound care   Complete by: As directed      Allergies as of 10/16/2019   No Known Allergies     Medication List    TAKE these medications   acetaminophen 325 MG tablet Commonly known as: TYLENOL Take 650  mg by mouth every 6 (six) hours as needed.   famciclovir 500 MG tablet Commonly known as: FAMVIR Take 0.5 tablets (250 mg total) by mouth every 8 (eight) hours for 5 days.   feeding supplement (ENSURE ENLIVE) Liqd Take 237 mLs by mouth 2 (two) times daily between meals.   levETIRAcetam 500 MG tablet Commonly known as: KEPPRA Take 1,500 mg by mouth 2 (two) times daily.   loratadine 10 MG tablet Commonly known  as: CLARITIN Take 10 mg by mouth daily.   metoCLOPramide 5 MG tablet Commonly known as: REGLAN Take 1 tablet (5 mg total) by mouth every 8 (eight) hours as needed for nausea or vomiting.   multivitamin with minerals Tabs tablet Take 1 tablet by mouth daily.   omeprazole 20 MG capsule Commonly known as: PRILOSEC Take 1 capsule (20 mg total) by mouth daily.   ondansetron 4 MG tablet Commonly known as: Zofran Take 1 tablet (4 mg total) by mouth daily as needed.   phenobarbital 16.2 MG tablet Commonly known as: LUMINAL Take 3 tablets (48.6 mg total) by mouth 3 (three) times daily.   phenytoin 100 MG ER capsule Commonly known as: DILANTIN Take 300 mg by mouth at bedtime.   promethazine 25 MG tablet Commonly known as: PHENERGAN Take 25-50 mg by mouth every 8 (eight) hours as needed.   SPS 15 GM/60ML suspension Generic drug: sodium polystyrene Take 60 mLs by mouth every Wednesday.   triamcinolone 0.1 % cream : eucerin Crea Apply 1 application topically 3 (three) times daily.            Discharge Care Instructions  (From admission, onward)         Start     Ordered   10/16/19 0000  Discharge wound care:       Comments: Patient will need persistent wound care for her pressure injury   10/16/19 1303   10/15/19 0000  Discharge wound care:       Comments: Keep the pressure injury sites clean and apply gel dressings.   10/15/19 1127          Contact information for follow-up providers    [place], [facility] Follow up in 1 week(s).   Contact information: [address] [place] [address] [phone]            Contact information for after-discharge care    Destination    [facility] Preferred [facility] .   Service: Skilled Nursing Contact information: [address] [place] [id number] [phone]                 No Known Allergies  Consultations:  GI  Surgery  ID  Procedures/Studies: DG  Chest 2 View  Result Date: 10/04/2019 CLINICAL DATA:  Nausea vomiting EXAM: CHEST - 2 VIEW COMPARISON:  08/18/2019 FINDINGS: Limited lateral view due to soft tissue artifact. Cardiomegaly with central vascular congestion. Patchy atelectasis at the left base. No definitive effusion. No pneumothorax. IMPRESSION: Cardiomegaly with central vascular congestion. Probable patchy atelectasis left base Electronically Signed   By: Jasmine PangKim  Fujinaga M.D.   On: 10/04/2019 17:57   DG Abd 1 View  Result Date: 10/09/2019 CLINICAL DATA:  NG tube EXAM: ABDOMEN - 1 VIEW COMPARISON:  10/08/2019, 10/09/2019 FINDINGS: Esophageal tube is looped within the stomach with the tip projecting over the gastric body. Nonobstructed gas pattern. IMPRESSION: Esophageal tube tip overlies the gastric body. Electronically Signed   By: Jasmine PangKim  Fujinaga M.D.   On:  10/09/2019 15:40   DG Abd 1 View  Result Date: 10/08/2019 CLINICAL DATA:  Nausea and vomiting. EXAM: ABDOMEN - 1 VIEW COMPARISON:  Radiograph 08/18/2019 and CT scan 10/04/2019 FINDINGS: The stomach is distended and filled with air and debris. Possible gastric outlet obstruction or gastroparesis. No findings suspicious for small bowel obstruction or free air. IMPRESSION: Distended stomach. Possible gastric outlet obstruction or gastroparesis. Electronically Signed   By: Rudie Meyer M.D.   On: 10/08/2019 11:06   CT ABDOMEN PELVIS W CONTRAST  Result Date: 10/04/2019 CLINICAL DATA:  Nausea, vomiting, and diarrhea. EXAM: CT ABDOMEN AND PELVIS WITH CONTRAST TECHNIQUE: Multidetector CT imaging of the abdomen and pelvis was performed using the standard protocol following bolus administration of intravenous contrast. CONTRAST:  OMNIPAQUE IOHEXOL 350 MG/ML SOLN COMPARISON:  Abdominal radiograph dated 08/18/2019 FINDINGS: Lower chest: Tiny bilateral pleural effusions with slight bibasilar atelectasis. Cardiomegaly. Hepatobiliary: Liver parenchyma is normal. There is a tiny amount of  pericholecystic fluid and slight pericholecystic haziness with the gallbladder is not distended in the wall is not thickened. No visible stones. No biliary ductal dilatation. Pancreas: Unremarkable. No pancreatic ductal dilatation or surrounding inflammatory changes. Spleen: Normal in size without focal abnormality. 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 Right adrenal gland is normal. Small benign-appearing cysts in both kidneys. No hydronephrosis. There is thickening of the bladder wall haziness around the bladder with enhancement of the mucosa, suggestive of cystitis. Stomach/Bowel: Stomach is within normal limits. Appendix appears normal. No evidence of bowel wall thickening, distention, or inflammatory changes. Vascular/Lymphatic: No significant vascular findings are present. No enlarged abdominal or pelvic lymph nodes. Reproductive: Uterus and bilateral adnexa are unremarkable. Other: No abdominal wall hernia or abnormality. No abdominopelvic ascites. Musculoskeletal: No acute or significant osseous findings. IMPRESSION: 1. Findings consistent with cystitis. 2. Tiny amount of pericholecystic fluid and slight pericholecystic haziness without gallbladder wall thickening or visible stones. This could represent early cholecystitis. 3. Tiny bilateral pleural effusions with slight bibasilar atelectasis. 4. 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 Electronically Signed   By: Francene Boyers M.D.   On: 10/04/2019 14:13   DG Abd Portable 1V  Result Date: 10/08/2019 CLINICAL DATA:  NG tube placement EXAM: PORTABLE ABDOMEN - 1 VIEW COMPARISON:  10/08/2019 FINDINGS: NG tube is located in the left lung, lower lobe. Recommend removal and replacement. Cardiomegaly. Bibasilar atelectasis. Mild vascular congestion. IMPRESSION: NG tube within the left lung in the left lower lobe. Recommend removal and replacement. Nurse has reportedly already removed the NG tube. Electronically  Signed   By: Charlett Nose M.D.   On: 10/08/2019 17:50   DG Naso G Tube Plc W/Fl W/Rad  Result Date: 10/09/2019 CLINICAL DATA:  NG tube placement request. EXAM: NASO G TUBE PLACEMENT WITH FL AND WITH RAD CONTRAST:  None. FLUOROSCOPY TIME:  Fluoroscopy Time:  0 minutes 30 seconds. Radiation Exposure Index (if provided by the fluoroscopic device): 19.4 mGy Number of Acquired Spot Images: 3. COMPARISON:  Abdomen 10/08/2019 FINDINGS: NG tube was placed under fluoroscopic guidance with tip placed into the stomach. There no complications. The patient was extremely combative. IMPRESSION: Successful fluoroscopically directed NG tube placement. Electronically Signed   By: Maisie Fus  Register   On: 10/09/2019 08:51   US ABDOMEN LIMITED RUQ  Result Date: 10/04/2019 CLINICAL DATA:  Pain and nausea EXAM: ULTRASOUND ABDOMEN LIMITED RIGHT UPPER QUADRANT COMPARISON:  CT 10/04/2019 FINDINGS: Gallbladder: No gallstones or wall thickening visualized. No sonographic Murphy sign noted by sonographer.  Common bile duct: Diameter: 3.2 mm Liver: No focal lesion identified. Within normal limits in parenchymal echogenicity. Portal vein is patent on color Doppler imaging with normal direction of blood flow towards the liver. Other: None. IMPRESSION: Ultrasound appearance of the gallbladder is within normal limits. Electronically Signed   By: Jasmine Pang M.D.   On: 10/04/2019 15:16    Subjective: Patient was resting comfortably when seen today.  Sister was in the room.  According to sister she did eat her breakfast and appears back to her baseline.  Talked with the daughter on phone.  Discharge Exam: Vitals:   10/16/19 0744 10/16/19 1135  BP: (!) 143/80 (!) 136/58  Pulse: 96 89  Resp: 20 (!) 21  Temp: 97.7 F (36.5 C) (!) 97.4 F (36.3 C)  SpO2: 100% 100%   Vitals:   10/16/19 0155 10/16/19 0619 10/16/19 0744 10/16/19 1135  BP: (!) 119/54 (!) 111/52 (!) 143/80 (!) 136/58  Pulse: 68 93 96 89  Resp: 17 20 20  (!) 21   Temp: 98.3 F (36.8 C) 97.6 F (36.4 C) 97.7 F (36.5 C) (!) 97.4 F (36.3 C)  TempSrc: Oral  Oral Oral  SpO2: 96% 100% 100% 100%  Weight:      Height:        General: Pt is awake, not in acute distress.  Nonverbal at baseline. Cardiovascular: RRR, S1/S2 +, no rubs, no gallops Respiratory: CTA bilaterally, no wheezing, no rhonchi Abdominal: Soft, NT, ND, bowel sounds + Extremities: no edema, no cyanosis   The results of significant diagnostics from this hospitalization (including imaging, microbiology, ancillary and laboratory) are listed below for reference.    Microbiology: Recent Results (from the past 240 hour(s))  SARS Coronavirus 2 by RT PCR (hospital order, performed in [facility] hospital lab) Nasopharyngeal Nasopharyngeal Swab     Status: None   Collection Time: 10/15/19 11:31 AM   Specimen: Nasopharyngeal Swab  Result Value Ref Range Status   SARS Coronavirus 2 NEGATIVE NEGATIVE Final    Comment: (NOTE) SARS-CoV-2 target nucleic acids are NOT DETECTED.  The SARS-CoV-2 RNA is generally detectable in upper and lower respiratory specimens during the acute phase of infection. The lowest concentration of SARS-CoV-2 viral copies this assay can detect is 250 copies / mL. A negative result does not preclude SARS-CoV-2 infection and should not be used as the sole basis for treatment or other patient management decisions.  A negative result may occur with improper specimen collection / handling, submission of specimen other than nasopharyngeal swab, presence of viral mutation(s) within the areas targeted by this assay, and inadequate number of viral copies (<250 copies / mL). A negative result must be combined with clinical observations, patient history, and epidemiological information.  Fact Sheet for Patients:   12/16/19  Fact Sheet for Healthcare Providers: BoilerBrush.com.cy  This test is not yet approved  or  cleared by the https://pope.com/ FDA and has been authorized for detection and/or diagnosis of SARS-CoV-2 by FDA under an Emergency Use Authorization (EUA).  This EUA will remain in effect (meaning this test can be used) for the duration of the COVID-19 declaration under Section 564(b)(1) of the Act, 21 U.S.C. section 360bbb-3(b)(1), unless the authorization is terminated or revoked sooner.  Performed at [facility], [address] [place], [place]      Labs: BNP (last 3 results) Recent Labs    08/18/19 1337 10/05/19 0038  BNP 32.0 35.9   Basic Metabolic Panel: Recent Labs  Lab  10/11/19 4315 10/11/19 4008 10/12/19 0434 10/13/19 0522 10/14/19 0527 10/15/19 0600 10/16/19 0656  NA 143  --  143 141 143 142  --   K 3.3*   < > 3.8 3.6 3.8 4.1 3.7  CL 110  --  109 105 106 105  --   CO2 26  --  25 27 29 29   --   GLUCOSE 95  --  106* 107* 96 86  --   BUN 6*  --  <5* <5* 7* 7*  --   CREATININE 0.71  --  0.54 0.51 0.59 0.59  --   CALCIUM 8.5*  --  8.5* 8.4* 8.5* 8.6*  --   MG 1.5*   < > 2.2 1.7 1.9 1.8 2.0   < > = values in this interval not displayed.   Liver Function Tests: Recent Labs  Lab 10/11/19 0605  AST 19  ALT 18  ALKPHOS 153*  BILITOT 0.5  PROT 7.0  ALBUMIN 2.3*   No results for input(s): LIPASE, AMYLASE in the last 168 hours. No results for input(s): AMMONIA in the last 168 hours. CBC: Recent Labs  Lab 10/11/19 1238 10/15/19 0600  WBC 5.5 5.0  NEUTROABS 3.3  --   HGB 9.9* 10.5*  HCT 29.8* 31.8*  MCV 82 84.1  PLT 275 294   Cardiac Enzymes: No results for input(s): CKTOTAL, CKMB, CKMBINDEX, TROPONINI in the last 168 hours. BNP: Invalid input(s): POCBNP CBG: No results for input(s): GLUCAP in the last 168 hours. D-Dimer No results for input(s): DDIMER in the last 72 hours. Hgb A1c No results for input(s): HGBA1C in the last 72 hours. Lipid Profile No results for input(s): CHOL, HDL, LDLCALC, TRIG, CHOLHDL, LDLDIRECT in  the last 72 hours. Thyroid function studies No results for input(s): TSH, T4TOTAL, T3FREE, THYROIDAB in the last 72 hours.  Invalid input(s): FREET3 Anemia work up No results for input(s): VITAMINB12, FOLATE, FERRITIN, TIBC, IRON, RETICCTPCT in the last 72 hours. Urinalysis    Component Value Date/Time   COLORURINE YELLOW (A) 10/04/2019 1244   APPEARANCEUR HAZY (A) 10/04/2019 1244   LABSPEC >1.046 (H) 10/04/2019 1244   PHURINE 7.0 10/04/2019 1244   GLUCOSEU NEGATIVE 10/04/2019 1244   HGBUR SMALL (A) 10/04/2019 1244   BILIRUBINUR NEGATIVE 10/04/2019 1244   KETONESUR 5 (A) 10/04/2019 1244   PROTEINUR 30 (A) 10/04/2019 1244   UROBILINOGEN 0.2 05/20/2013 0437   NITRITE NEGATIVE 10/04/2019 1244   LEUKOCYTESUR LARGE (A) 10/04/2019 1244   Sepsis Labs Invalid input(s): PROCALCITONIN,  WBC,  LACTICIDVEN Microbiology Recent Results (from the past 240 hour(s))  SARS Coronavirus 2 by RT PCR (hospital order, performed in [facility] hospital lab) Nasopharyngeal Nasopharyngeal Swab     Status: None   Collection Time: 10/15/19 11:31 AM   Specimen: Nasopharyngeal Swab  Result Value Ref Range Status   SARS Coronavirus 2 NEGATIVE NEGATIVE Final    Comment: (NOTE) SARS-CoV-2 target nucleic acids are NOT DETECTED.  The SARS-CoV-2 RNA is generally detectable in upper and lower respiratory specimens during the acute phase of infection. The lowest concentration of SARS-CoV-2 viral copies this assay can detect is 250 copies / mL. A negative result does not preclude SARS-CoV-2 infection and should not be used as the sole basis for treatment or other patient management decisions.  A negative result may occur with improper specimen collection / handling, submission of specimen other than nasopharyngeal swab, presence of viral mutation(s) within the areas targeted by this assay, and inadequate number of viral copies (<  250 copies / mL). A negative result must be combined with clinical observations,  patient history, and epidemiological information.  Fact Sheet for Patients:   BoilerBrush.com.cy  Fact Sheet for Healthcare Providers: https://pope.com/  This test is not yet approved or  cleared by the [place] FDA and has been authorized for detection and/or diagnosis of SARS-CoV-2 by FDA under an Emergency Use Authorization (EUA).  This EUA will remain in effect (meaning this test can be used) for the duration of the COVID-19 declaration under Section 564(b)(1) of the Act, 21 U.S.C. section 360bbb-3(b)(1), unless the authorization is terminated or revoked sooner.  Performed at [facility], [address] [place], [place] [address]     Time coordinating discharge: Over 30 minutes  SIGNED:  Arnetha Courser, MD  [facility] 10/16/2019, 1:07 PM  If 7PM-7AM, please contact night-coverage [email or web address]  This record has been created using Conservation officer, historic buildings. Errors have been sought and corrected,but may not always be located. Such creation errors do not reflect on the standard of care.

## 2019-10-15 NOTE — [place] FL2 (Signed)
[place] MEDICAID FL2 LEVEL OF CARE SCREENING TOOL     IDENTIFICATION  Patient Name: Mariah Jimenez Birthdate: 1945-11-04 Sex: female Admission Date (Current Location): 10/04/2019  [place] and [place] Number:  [address] [id number] Facility and Address:  [facility], [address], [place], [place] [address]      Provider Number: [id number]  Attending Physician Name and Address:  Arnetha Courser, MD  Relative Name and Phone Number:  [name]-sster [phone]    Current Level of Care: Hospital Recommended Level of Care: Skilled Nursing Facility Prior Approval Number:    Date Approved/Denied:   PASRR Number: [id number]  Discharge Plan: SNF    Current Diagnoses: Patient Active Problem List   Diagnosis Date Noted  . 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 Gastric polyp   . Class 3 obesity 10/07/2019  . Sinus tachycardia 10/05/2019  . Pressure injury of skin 10/05/2019  . Dehydration 10/05/2019  . Intractable nausea and vomiting 10/04/2019  . Intellectual disability 10/04/2019  . Elevated lipase 10/04/2019  . Seizure disorder (HCC) 08/18/2019  . UTI (urinary tract infection) 08/18/2019  . Hypothermia 08/18/2019  . Acute respiratory failure with hypoxia (HCC) 08/18/2019  . Palliative care encounter 10/06/2018  . Debility 10/06/2018  . Tibia/fibula fracture 04/09/2016    Orientation RESPIRATION BLADDER Height & Weight     Self  Normal Incontinent Weight: 284 lb 9.8 oz (129.1 kg) Height:  5\' 3"  (160 cm)  BEHAVIORAL SYMPTOMS/MOOD NEUROLOGICAL BOWEL NUTRITION STATUS      Incontinent Diet (soft diet thin liquids)  AMBULATORY STATUS COMMUNICATION OF NEEDS Skin   Total Care Verbally Normal                       Personal Care Assistance Level of Assistance  Bathing, Dressing, Feeding Bathing Assistance: Maximum assistance Feeding assistance: Limited assistance Dressing Assistance: Maximum assistance     Functional Limitations Info              SPECIAL CARE FACTORS FREQUENCY                       Contractures Contractures Info: Not present    Additional Factors Info  Code Status, Allergies Code Status Info: DNR Allergies Info: NKDA           Current Medications (10/15/2019):  This is the current hospital active medication list Current Facility-Administered Medications  Medication Dose Route Frequency Provider Last Rate Last Admin  . acetaminophen (TYLENOL) tablet 650 mg  650 mg Oral Q6H PRN 12/16/2019, MD       Or  . acetaminophen (TYLENOL) suppository 650 mg  650 mg Rectal Q6H PRN Andris Baumann, MD      . acyclovir (ZOVIRAX) 200 MG capsule 400 mg  400 mg Oral TID Andris Baumann Tublu, MD   400 mg at 10/14/19 2211  . dextrose 5 % in lactated ringers infusion   Intravenous Continuous 2212, MD 50 mL/hr at 10/13/19 1340 New Bag at 10/13/19 1340  . enoxaparin (LOVENOX) injection 40 mg  40 mg Subcutaneous Q12H 12/14/19 Tublu, MD   40 mg at 10/14/19 2211  . feeding supplement (BOOST / RESOURCE BREEZE) liquid 1 Container  1 Container Oral TID BM 2212, MD   1 Container at 10/14/19 1737  . levETIRAcetam (KEPPRA) 100 MG/ML solution 1,500 mg  1,500 mg Oral BID Arrien, 12/15/19, MD   1,500 mg at 10/14/19 2211  . loratadine (CLARITIN) tablet  10 mg  10 mg Oral Daily Andris Baumann, MD   10 mg at 10/14/19 0940  . magnesium sulfate IVPB 1 g 100 mL  1 g Intravenous Once Arnetha Courser, MD      . metoCLOPramide (REGLAN) tablet 5 mg  5 mg Oral TID AC & HS Arnetha Courser, MD   5 mg at 10/14/19 2210  . multivitamin with minerals tablet 1 tablet  1 tablet Oral Daily Arrien, York Ram, MD   1 tablet at 10/14/19 [phone]  . ondansetron (ZOFRAN) tablet 4 mg  4 mg Oral Q6H PRN Andris Baumann, MD       Or  . ondansetron [facility]) injection 4 mg  4 mg Intravenous Q6H PRN Andris Baumann, MD      . pantoprazole (PROTONIX) EC tablet 40 mg  40 mg Oral Daily Leandro Reasoner Tublu, MD      . PHENobarbital (LUMINAL) tablet 48.6 mg  48.6 mg Oral TID Coralie Keens, MD   48.6 mg at 10/14/19 2210  . phenytoin (DILANTIN) ER capsule 300 mg  300 mg Oral QHS Arrien, York Ram, MD   300 mg at 10/14/19 2211  . sodium chloride flush (NS) 0.9 % injection 10-40 mL  10-40 mL Intracatheter Q12H Andris Baumann, MD   10 mL at 10/14/19 2212  . sodium chloride flush (NS) 0.9 % injection 10-40 mL  10-40 mL Intracatheter PRN Andris Baumann, MD   10 mL at 10/12/19 2233     Discharge Medications: Please see discharge summary for a list of discharge medications.  Relevant Imaging Results:  Relevant Lab Results:   Additional Information SSN: [id number]  Oria Klimas, Lemar Livings, LCSW

## 2019-10-15 NOTE — TOC Progression Note (Signed)
Transition of Care [facility]) - Progression Note    Patient Details  Name: Mariah Jimenez MRN: [id number] Date of Birth: Nov 06, 1945  Transition of Care [facility]) CM/SW Contact  Rain Friedt, Lemar Livings, LCSW Phone Number: 10/15/2019, 1:51 PM  Clinical Narrative:  Medical issues have cancelled discharge for today. Have let EMS and [facility] know. Will await medical stability for transfer.       Barriers to Discharge: No Barriers Identified  Expected Discharge Plan and Services           Expected Discharge Date: 10/15/19                                     Social Determinants of Health (SDOH) Interventions    Readmission Risk Interventions No flowsheet data found.

## 2019-10-15 NOTE — Consult Note (Signed)
PHARMACY CONSULT NOTE - FOLLOW UP  Pharmacy Consult for Electrolyte Monitoring and Replacement   Recent Labs: Potassium (mmol/L)  Date Value  10/15/2019 4.1   Magnesium (mg/dL)  Date Value  04/04/4974 1.8   Calcium (mg/dL)  Date Value  30/08/1100 8.6 (L)   Albumin (g/dL)  Date Value  02/26/3566 2.3 (L)   Sodium (mmol/L)  Date Value  10/15/2019 142     Assessment: Pharmacy consulted to manage potassium and magnesium in this 74 year old female admitted with nausea and vomiting.  She currently is tolerating small amounts of PO.  Note: HSV esophagitis, Chronic gastritis, dysphagia 1 diet  Goal of Therapy:  Electrolytes WNL  Plan:  K 4.1 Mag 1.8 Scr 0.59 Would like goal Mag closer to or > 2.0. Will order Magnesium 1 gm IV x 1  Will F/U in AM and continue order replacement as needed.    Angelique Blonder ,PharmD, BCPS Clinical Pharmacist 10/15/2019 8:51 AM

## 2019-10-15 NOTE — TOC Transition Note (Signed)
Transition of Care [facility]) - CM/SW Discharge Note   Patient Details  Name: Mariah Jimenez MRN: [id number] Date of Birth: [birth year]  Transition of Care [facility]) CM/SW Contact:  Lucy Chris, LCSW Phone Number: 10/15/2019, 12:31 PM   Clinical Narrative:  Pt medically stable to return back to  healthcare center going to room 20B. Sister-[name] aware of plan. Will call EMS once DC paperwork and COVID test back. Bedside RN to call report to [phone].      Final next level of care: Skilled Nursing Facility Barriers to Discharge: No Barriers Identified   Patient Goals and CMS Choice        Discharge Placement   Existing PASRR number confirmed : 10/08/19          Patient chooses bed at: [facility] Patient to be transferred to facility by: EMS Name of family member notified: [name]-sister Patient and family notified of of transfer: 10/15/19  Discharge Plan and Services                                     Social Determinants of Health (SDOH) Interventions     Readmission Risk Interventions No flowsheet data found.

## 2019-10-16 DIAGNOSIS — M2550 Pain in unspecified joint: Secondary | ICD-10-CM | POA: Diagnosis not present

## 2019-10-16 DIAGNOSIS — I959 Hypotension, unspecified: Secondary | ICD-10-CM | POA: Diagnosis not present

## 2019-10-16 DIAGNOSIS — K208 Other esophagitis without bleeding: Secondary | ICD-10-CM | POA: Diagnosis not present

## 2019-10-16 DIAGNOSIS — B0089 Other herpesviral infection: Secondary | ICD-10-CM | POA: Diagnosis not present

## 2019-10-16 DIAGNOSIS — R404 Transient alteration of awareness: Secondary | ICD-10-CM | POA: Diagnosis not present

## 2019-10-16 DIAGNOSIS — R1111 Vomiting without nausea: Secondary | ICD-10-CM | POA: Diagnosis not present

## 2019-10-16 DIAGNOSIS — Z7401 Bed confinement status: Secondary | ICD-10-CM | POA: Diagnosis not present

## 2019-10-16 LAB — POTASSIUM: Potassium: 3.7 mmol/L (ref 3.5–5.1)

## 2019-10-16 LAB — MAGNESIUM: Magnesium: 2 mg/dL (ref 1.7–2.4)

## 2019-10-16 MED ORDER — FAMCICLOVIR 500 MG PO TABS: 250.0000 mg | ORAL_TABLET | Freq: Three times a day (TID) | ORAL | 0 refills | Status: DC

## 2019-10-16 MED ORDER — POTASSIUM CHLORIDE CRYS ER 20 MEQ PO TBCR
20.0000 meq | EXTENDED_RELEASE_TABLET | Freq: Once | ORAL | Status: AC
  Administered 2019-10-16: 20 meq via ORAL
  Filled 2019-10-16: qty 1

## 2019-10-16 MED ORDER — FAMCICLOVIR 500 MG PO TABS
250.0000 mg | ORAL_TABLET | Freq: Three times a day (TID) | ORAL | Status: DC
  Filled 2019-10-16 (×2): qty 0.5

## 2019-10-16 NOTE — TOC Progression Note (Addendum)
Transition of Care [facility]) - Progression Note    Patient Details  Name: Mariah Jimenez MRN: [id number] Date of Birth: 1945-12-14  Transition of Care [facility]) CM/SW Contact  Kuuipo Anzaldo, Lemar Livings, LCSW Phone Number: 10/16/2019, 3:03 PM  Clinical Narrative:   Call from [name]-sister who wants pt to go to [facility] instead of [facility]. Have contacted [facility] and sent information to them. He reports will take 10 minutes to decide if can accept pt. Await return call and will need to re-schedule EMS  3:25 Pm Called [facility] who reports they are running the Medicaid to make sure covered. He is aware pt has left and he will coordinate with [facility] to get her from there if offers bed. Pt's sister-[name] is aware of this and in agreement    Barriers to Discharge: No Barriers Identified  Expected Discharge Plan and Services           Expected Discharge Date: 10/16/19                                     Social Determinants of Health (SDOH) Interventions    Readmission Risk Interventions No flowsheet data found.

## 2019-10-16 NOTE — Progress Notes (Signed)
74 year old female with intellectual disability who lives in a nursing home and seizure disorder was admitted 10/04/2019 with voluminous vomiting. Patient was treated with NG tube which revealed a very large amount of material which was decompressed. Patient was seen by general surgery who did not feel that volvulus was likely. She was seen by GI for possible gastric outlet obstruction and underwent EGD on 10/10/2019. 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 Patient improved clinically and now back to her baseline.  She was able to tolerate p.o. intake well.  Patient was discharged yesterday with acyclovir to complete the course till 10/20/2019.  Just before leaving she developed a seizure.  We were able to break seizure with 1 dose of Ativan.    According to literature acyclovir can also cause seizures and even status epilepticus which is more common in patients with renal dysfunction but can also occur in patients with normal renal function.  Seizures normally occurs after 1 to 5 days of therapy which this patient was in. There was also some concern about Keppra and Dilantin levels as patient did develop some gastric distention requiring NG tube.  She was given IV meds during that time and then transition to p.o.  We did order Keppra and Dilantin levels with pending results as they take couple of days.  Acyclovir was discontinued and she was started on famciclovir.  When saw today patient was resting comfortably.  Patient is nonverbal at baseline.  Exam was within normal limits and remain the same as prior.  Per sister she was again looks back to her baseline.  It ate her breakfast.  No more seizures since yesterday.  Patient is being discharged today back to her facility with famciclovir instead of acyclovir.

## 2019-10-16 NOTE — TOC Progression Note (Signed)
Transition of Care [facility]) - Progression Note    Patient Details  Name: Mariah Jimenez MRN: [id number] Date of Birth: Aug 12, 1945  Transition of Care [facility]) CM/SW Contact  Nahun Kronberg, Lemar Livings, LCSW Phone Number: 10/16/2019, 12:41 PM  Clinical Narrative:  Pt is medically stable to go to [facility] today. Have left message for [name]-sister and awaiting MD to change DC date on DC summary. Bedside RN aware of plan and will have ready to go once MD paperwork changed. Report to [id number]. Going to room 20 B.       Barriers to Discharge: No Barriers Identified  Expected Discharge Plan and Services           Expected Discharge Date: 10/15/19                                     Social Determinants of Health (SDOH) Interventions    Readmission Risk Interventions No flowsheet data found.

## 2019-10-16 NOTE — Consult Note (Signed)
PHARMACY CONSULT NOTE - FOLLOW UP  Pharmacy Consult for Electrolyte Monitoring and Replacement   Recent Labs: Potassium (mmol/L)  Date Value  10/16/2019 3.7   Magnesium (mg/dL)  Date Value  15/61/5379 2.0   Calcium (mg/dL)  Date Value  43/27/6147 8.6 (L)   Albumin (g/dL)  Date Value  01/08/5746 2.3 (L)   Sodium (mmol/L)  Date Value  10/15/2019 142     Assessment: Pharmacy consulted to manage potassium and magnesium in this 74 year old female admitted with nausea and vomiting.  She currently is tolerating small amounts of PO.  Note: HSV esophagitis, Chronic gastritis, dysphagia 1 diet  Goal of Therapy:  Electrolytes WNL  Plan:  Will order KCL x 1 dose this morning.   Will F/U in AM and continue order replacement as needed.   Gardner Candle ,PharmD, BCPS Clinical Pharmacist 10/16/2019 7:58 AM

## 2019-10-17 DIAGNOSIS — R279 Unspecified lack of coordination: Secondary | ICD-10-CM | POA: Diagnosis not present

## 2019-10-17 DIAGNOSIS — R569 Unspecified convulsions: Secondary | ICD-10-CM | POA: Diagnosis not present

## 2019-10-17 DIAGNOSIS — Z743 Need for continuous supervision: Secondary | ICD-10-CM | POA: Diagnosis not present

## 2019-10-17 DIAGNOSIS — R531 Weakness: Secondary | ICD-10-CM | POA: Diagnosis not present

## 2019-10-17 LAB — PHENYTOIN LEVEL, FREE AND TOTAL
Phenytoin, Free: NOT DETECTED ug/mL (ref 1.0–2.0)
Phenytoin, Total: 2.4 ug/mL — ABNORMAL LOW (ref 10.0–20.0)

## 2019-10-19 ENCOUNTER — Non-Acute Institutional Stay: Payer: Medicare Other | Admitting: Primary Care

## 2019-10-19 ENCOUNTER — Other Ambulatory Visit: Payer: Self-pay

## 2019-10-19 DIAGNOSIS — R569 Unspecified convulsions: Secondary | ICD-10-CM

## 2019-10-19 DIAGNOSIS — Z515 Encounter for palliative care: Secondary | ICD-10-CM

## 2019-10-19 LAB — LEVETIRACETAM LEVEL: Levetiracetam Lvl: 66.7 ug/mL — ABNORMAL HIGH (ref 10.0–40.0)

## 2019-10-19 NOTE — Progress Notes (Signed)
[facility] Consult Note Telephone: [phone]  Fax: [phone]  PATIENT NAME: Mariah Jimenez [name] [address] [phone] (home)  DOB: 1946-02-10 MRN: [id number]  PRIMARY CARE PROVIDER:    Dr. Lenor Coffin  REFERRING PROVIDER:   Dr Lenor Coffin  RESPONSIBLE PARTY:   Extended Emergency Contact Information Primary Emergency Contact: Canal,ruby Mobile Phone: [phone] Relation: Sister  I met with patient in the facility.  ASSESSMENT AND RECOMMENDATIONS:   1. Advance Care Planning/Goals of Care: Goals include to maximize quality of life and symptom management. DNR on record. Spoke with sister [name]. She is caregiver and POA.  States they moved her to [facility] due to seizure activity from late medication administration at previous facility.  2. Symptom Management:   Seizure Activity: Has to be on steady dose so she will not break through.Recommend dosing at same times daily and observe for seizure activity.   Skin Care: Feet were swollen with sores and no nail care in > a year per sister . These have improved.  Today on my inspection I did not see any issues. I would recommend daily foot care including moisturizer, and routine podiatry.  Covid: Not currently immunized but sister states she will be given the vaccine. No s/sx today.  3. Family /Caregiver/Community Supports:  Sister Bertram Millard is POA. She has some health issues. Lost another sister recently.   4. Cognitive / Functional decline: A and O x 1. Unable to converse purposefully with this Probation officer. Dependent in all adls, iadls.   5. Follow up Palliative Care Visit: Palliative care will continue to follow for goals of care clarification and symptom management. Return 4 weeks or prn.  I spent 35 minutes providing this consultation,  from 1100 to 1135. More than 50% of the time in this consultation was spent coordinating communication.   HISTORY OF PRESENT  ILLNESS:  Mariah Jimenez is a 74 y.o. year old female with multiple medical problems including obesity, limited cognition, seizure history. Palliative Care was asked to follow this patient by consultation request of Dr Tamala Julian and Family members to help address advance care planning and goals of care. This is the initial visit.  CODE STATUS: DNR  PPS: 40%  HOSPICE ELIGIBILITY/DIAGNOSIS: no  PAST MEDICAL HISTORY:  Past Medical History:  Diagnosis Date  . Mental retardation   . Seizures ([place])     SOCIAL HX:  Social History   Tobacco Use  . Smoking status: Never Smoker  . Smokeless tobacco: Never Used  Substance Use Topics  . Alcohol use: No    ALLERGIES: No Known Allergies   PERTINENT MEDICATIONS:  Outpatient Encounter Medications as of 10/19/2019  Medication Sig  . acetaminophen (TYLENOL) 325 MG tablet Take 650 mg by mouth every 6 (six) hours as needed.  . famciclovir (FAMVIR) 500 MG tablet Take 0.5 tablets (250 mg total) by mouth every 8 (eight) hours for 5 days.  . feeding supplement, ENSURE ENLIVE, (ENSURE ENLIVE) LIQD Take 237 mLs by mouth 2 (two) times daily between meals.  . levETIRAcetam (KEPPRA) 500 MG tablet Take 1,500 mg by mouth 2 (two) times daily.  Marland Kitchen loratadine (CLARITIN) 10 MG tablet Take 10 mg by mouth daily.  . metoCLOPramide (REGLAN) 5 MG tablet Take 1 tablet (5 mg total) by mouth every 8 (eight) hours as needed for nausea or vomiting.  . Multiple Vitamin (MULTIVITAMIN WITH MINERALS) TABS tablet Take 1 tablet by mouth daily.  Marland Kitchen omeprazole (PRILOSEC) 20 MG capsule Take  1 capsule (20 mg total) by mouth daily.  . ondansetron (ZOFRAN) 4 MG tablet Take 1 tablet (4 mg total) by mouth daily as needed.  Marland Kitchen PHENobarbital (LUMINAL) 16.2 MG tablet Take 3 tablets (48.6 mg total) by mouth 3 (three) times daily.  . phenytoin (DILANTIN) 100 MG ER capsule Take 300 mg by mouth at bedtime.  . promethazine (PHENERGAN) 25 MG tablet Take 25-50 mg by mouth every 8 (eight) hours as needed.   . SPS 15 GM/60ML suspension Take 60 mLs by mouth every Wednesday.  . Triamcinolone Acetonide (TRIAMCINOLONE 0.1 % CREAM : EUCERIN) CREA Apply 1 application topically 3 (three) times daily.    No facility-administered encounter medications on file as of 10/19/2019.    PHYSICAL EXAM / ROS:   Current and past weights: 284 lbs General: NAD, frail appearing, obese Eyes: Lids without exudate EMT: hears normal tones, moist oral mucosa Cardiovascular: S1S2, reg rate,  no chest pain reported, no pitting edema  Pulmonary: no cough, no increased SOB, room air Abdomen: recent esophageal herpes infection, rx with anti viral. Appetite good, endorses  occ constipation, incontinent of bowel GU: denies dysuria, incontinent of urine MSK:  no joint and ROM abnormalities, non ambulatory, bed bound Skin: no rashes or wounds reported or observed Neurological: Weakness, limitations due to autism, denies pain  Eliezer Lofts, NP [facility]  COVID-19 PATIENT SCREENING TOOL  Person answering questions: ____________Staff_______ _____   1.  Is the patient or any family member in the home showing any signs or symptoms regarding respiratory infection?               Person with Symptom- __________NA_________________  a. Fever                                                                          Yes___ No___          ___________________  b. Shortness of breath                                                    Yes___ No___          ___________________ c. Cough/congestion                                       Yes___  No___         ___________________ d. Body aches/pains                                                         Yes___ No___        ____________________ e. Gastrointestinal symptoms (diarrhea, nausea)           Yes___ No___        ____________________  2. Within the past 14 days, has anyone living in the home had any  contact with someone with or under investigation for COVID-19?    Yes___ No_X_    Person __________________

## 2019-10-22 DIAGNOSIS — B009 Herpesviral infection, unspecified: Secondary | ICD-10-CM | POA: Diagnosis not present

## 2019-10-22 DIAGNOSIS — N390 Urinary tract infection, site not specified: Secondary | ICD-10-CM | POA: Diagnosis not present

## 2019-10-22 DIAGNOSIS — K209 Esophagitis, unspecified without bleeding: Secondary | ICD-10-CM | POA: Diagnosis not present

## 2019-10-22 DIAGNOSIS — R569 Unspecified convulsions: Secondary | ICD-10-CM | POA: Diagnosis not present

## 2019-10-23 DIAGNOSIS — D649 Anemia, unspecified: Secondary | ICD-10-CM | POA: Diagnosis not present

## 2019-10-23 DIAGNOSIS — I10 Essential (primary) hypertension: Secondary | ICD-10-CM | POA: Diagnosis not present

## 2019-11-09 DIAGNOSIS — Z23 Encounter for immunization: Secondary | ICD-10-CM | POA: Diagnosis not present

## 2019-11-14 DIAGNOSIS — R569 Unspecified convulsions: Secondary | ICD-10-CM | POA: Diagnosis not present

## 2019-11-14 DIAGNOSIS — R1310 Dysphagia, unspecified: Secondary | ICD-10-CM | POA: Diagnosis not present

## 2019-11-14 DIAGNOSIS — E669 Obesity, unspecified: Secondary | ICD-10-CM | POA: Diagnosis not present

## 2019-11-14 DIAGNOSIS — R4182 Altered mental status, unspecified: Secondary | ICD-10-CM | POA: Diagnosis not present

## 2019-11-14 DIAGNOSIS — R609 Edema, unspecified: Secondary | ICD-10-CM | POA: Diagnosis not present

## 2019-12-06 ENCOUNTER — Other Ambulatory Visit: Payer: Self-pay

## 2019-12-06 ENCOUNTER — Non-Acute Institutional Stay: Payer: Medicare Other | Admitting: Primary Care

## 2019-12-06 DIAGNOSIS — R569 Unspecified convulsions: Secondary | ICD-10-CM

## 2019-12-06 DIAGNOSIS — Z515 Encounter for palliative care: Secondary | ICD-10-CM

## 2019-12-06 DIAGNOSIS — R5381 Other malaise: Secondary | ICD-10-CM | POA: Diagnosis not present

## 2019-12-06 NOTE — Progress Notes (Signed)
[facility] Palliative [facility] Consult Note Telephone: [phone]  Fax: [phone]  PATIENT NAME: Mariah Jimenez [place] [address] [phone] (home)  DOB: 1946-01-03 MRN: [id number]  PRIMARY CARE PROVIDER:    Marisa Hua, MD,  [place] [address] [phone]  REFERRING PROVIDER:   Marisa Hua, MD [place],  [place] [address] [phone]  RESPONSIBLE PARTY:   Extended Emergency Contact Information Primary Emergency Contact: Gibeau,ruby Mobile Phone: [phone] Relation: Sister  I met face to face with patient in the facility.  ASSESSMENT AND RECOMMENDATIONS:   1. Advance Care Planning/Goals of Care: Goals include to maximize quality of life and symptom management. Has DNR, no MOST. Reached out to [place], [place]. No answer, messages full and no ability to leave a message.   2. Symptom Management:  Patient was alert, interactive but min. Verbal. Staff understands some words and pt is interactive with them. Today she presents in no distress, Staff states she's more relaxed and allowing personal care and support. She moved from another facility a few months ago. She is now acclimating to new surroundings.  3. Follow up Palliative Care Visit: Palliative care will continue to follow for goals of care clarification and symptom management. Return 8 weeks or prn.  4. Family /Caregiver/Community Supports: Unable to reach, lives in [place]. Cognitive / Functional decline: A and Ox 1, interacts with staff. Dependent in all adls  And ialds.  I spent 25 minutes providing this consultation,  from 1500 to 1525. More than 50% of the time in this consultation was spent coordinating communication.   CHIEF COMPLAINT:  Seizure disorder HISTORY OF PRESENT ILLNESS:  Avarae Zwart is a 74 y.o. year old female with multiple medical problems including obesity, limited cognition, seizure history..  Palliative Care was asked to follow this patient by consultation request of Marisa Hua, MD to help address advance care planning and goals of care. This is a follow up visit.  CODE STATUS: DNR  PPS: 40%  HOSPICE ELIGIBILITY/DIAGNOSIS: TBD  PAST MEDICAL HISTORY:  Past Medical History:  Diagnosis Date  . Mental retardation   . Seizures ([place])     SOCIAL HX:  Social History   Tobacco Use  . Smoking status: Never Smoker  . Smokeless tobacco: Never Used  Substance Use Topics  . Alcohol use: No   FAMILY HX:  Family History  Family history unknown: Yes    ALLERGIES: No Known Allergies   PERTINENT MEDICATIONS:  Outpatient Encounter Medications as of 12/06/2019  Medication Sig  . acetaminophen (TYLENOL) 325 MG tablet Take 650 mg by mouth every 6 (six) hours as needed.  . levETIRAcetam (KEPPRA) 500 MG tablet Take 1,500 mg by mouth 2 (two) times daily.  Marland Kitchen loratadine (CLARITIN) 10 MG tablet Take 10 mg by mouth daily.  . metoCLOPramide (REGLAN) 5 MG tablet Take 1 tablet (5 mg total) by mouth every 8 (eight) hours as needed for nausea or vomiting.  Marland Kitchen omeprazole (PRILOSEC) 20 MG capsule Take 1 capsule (20 mg total) by mouth daily.  . ondansetron (ZOFRAN) 4 MG tablet Take 1 tablet (4 mg total) by mouth daily as needed.  Marland Kitchen PHENobarbital (LUMINAL) 16.2 MG tablet Take 3 tablets (48.6 mg total) by mouth 3 (three) times daily.  . phenytoin (DILANTIN) 100 MG ER capsule Take 300 mg by mouth at bedtime.  . SPS 15 GM/60ML suspension Take 60 mLs by mouth every Wednesday.  . Triamcinolone Acetonide (TRIAMCINOLONE  0.1 % CREAM : EUCERIN) CREA Apply 1 application topically 3 (three) times daily.    No facility-administered encounter medications on file as of 12/06/2019.    PHYSICAL EXAM / ROS:   Current and past weights: stable General: NAD, frail appearing, obese Cardiovascular: no chest pain reported, no edema  Pulmonary: no cough, no increased SOB, room air Abdomen: appetite good,  denies constipation, incontinent of bowel GU: denies dysuria, incontinent of urine MSK:  ++ joint and ROM abnormalities, non ambulatory Skin: no rashes or wounds reported Neurological: Weakness, MR, denies seizures  Jason Coop, NP , DNP, MPH, [facility]  COVID-19 PATIENT SCREENING TOOL  Person answering questions: ____________staff____ _____   1.  Is the patient or any family member in the home showing any signs or symptoms regarding respiratory infection?               Person with Symptom- __________NA_________________  a. Fever                                                                          Yes___ No___          ___________________  b. Shortness of breath                                                    Yes___ No___          ___________________ c. Cough/congestion                                       Yes___  No___         ___________________ d. Body aches/pains                                                         Yes___ No___        ____________________ e. Gastrointestinal symptoms (diarrhea, nausea)           Yes___ No___        ____________________  2. Within the past 14 days, has anyone living in the home had any contact with someone with or under investigation for COVID-19?    Yes___ No_X_   Person __________________

## 2019-12-12 DIAGNOSIS — F79 Unspecified intellectual disabilities: Secondary | ICD-10-CM | POA: Diagnosis not present

## 2019-12-12 DIAGNOSIS — D649 Anemia, unspecified: Secondary | ICD-10-CM | POA: Diagnosis not present

## 2019-12-12 DIAGNOSIS — E669 Obesity, unspecified: Secondary | ICD-10-CM | POA: Diagnosis not present

## 2019-12-12 DIAGNOSIS — R05 Cough: Secondary | ICD-10-CM | POA: Diagnosis not present

## 2019-12-12 DIAGNOSIS — Z79899 Other long term (current) drug therapy: Secondary | ICD-10-CM | POA: Diagnosis not present

## 2019-12-12 DIAGNOSIS — R945 Abnormal results of liver function studies: Secondary | ICD-10-CM | POA: Diagnosis not present

## 2019-12-12 DIAGNOSIS — R609 Edema, unspecified: Secondary | ICD-10-CM | POA: Diagnosis not present

## 2019-12-12 DIAGNOSIS — R569 Unspecified convulsions: Secondary | ICD-10-CM | POA: Diagnosis not present

## 2019-12-12 DIAGNOSIS — G40909 Epilepsy, unspecified, not intractable, without status epilepticus: Secondary | ICD-10-CM | POA: Diagnosis not present

## 2019-12-12 DIAGNOSIS — J069 Acute upper respiratory infection, unspecified: Secondary | ICD-10-CM | POA: Diagnosis not present

## 2019-12-24 DIAGNOSIS — Z79899 Other long term (current) drug therapy: Secondary | ICD-10-CM | POA: Diagnosis not present

## 2020-01-01 DIAGNOSIS — T420X5A Adverse effect of hydantoin derivatives, initial encounter: Secondary | ICD-10-CM | POA: Diagnosis not present

## 2020-01-02 DIAGNOSIS — F79 Unspecified intellectual disabilities: Secondary | ICD-10-CM | POA: Diagnosis not present

## 2020-01-02 DIAGNOSIS — Z7189 Other specified counseling: Secondary | ICD-10-CM | POA: Diagnosis not present

## 2020-01-18 DIAGNOSIS — F79 Unspecified intellectual disabilities: Secondary | ICD-10-CM | POA: Diagnosis not present

## 2020-01-23 ENCOUNTER — Other Ambulatory Visit: Payer: Self-pay

## 2020-01-23 ENCOUNTER — Non-Acute Institutional Stay: Payer: Medicare Other | Admitting: Primary Care

## 2020-01-23 DIAGNOSIS — N184 Chronic kidney disease, stage 4 (severe): Secondary | ICD-10-CM | POA: Diagnosis not present

## 2020-01-23 DIAGNOSIS — F79 Unspecified intellectual disabilities: Secondary | ICD-10-CM | POA: Diagnosis not present

## 2020-01-23 DIAGNOSIS — E119 Type 2 diabetes mellitus without complications: Secondary | ICD-10-CM | POA: Diagnosis not present

## 2020-01-23 DIAGNOSIS — K219 Gastro-esophageal reflux disease without esophagitis: Secondary | ICD-10-CM | POA: Diagnosis not present

## 2020-01-23 DIAGNOSIS — G40909 Epilepsy, unspecified, not intractable, without status epilepticus: Secondary | ICD-10-CM

## 2020-01-23 DIAGNOSIS — Z515 Encounter for palliative care: Secondary | ICD-10-CM

## 2020-01-23 DIAGNOSIS — I10 Essential (primary) hypertension: Secondary | ICD-10-CM | POA: Diagnosis not present

## 2020-01-23 DIAGNOSIS — F78A9 Other genetic related intellectual disability: Secondary | ICD-10-CM | POA: Diagnosis not present

## 2020-01-23 NOTE — Progress Notes (Signed)
[facility] Consult Note Telephone: [phone]  Fax: [phone]  PATIENT NAME: Mariah Jimenez Mariah Jimenez [id number] [phone] (home)  DOB: 27-Sep-1945 MRN: [id number]  PRIMARY CARE PROVIDER:    Alvester Morin, MD [address]. [place] [address] [phone]  REFERRING PROVIDER:   Alvester Morin, MD [address]. [place],  [place] [address]  RESPONSIBLE PARTY:   Extended Emergency Contact Information Primary Emergency Contact: Holden,ruby Mobile Phone: [phone] Relation: Sister  I met face to face with patient in the facility.   ASSESSMENT AND RECOMMENDATIONS:   1. Advance Care Planning/Goals of Care: Goals include to maximize quality of life and symptom management. ACP on file with DNR.Sister did not change any advance care directives   2. Symptom Management:   I met with [name] in her nursing home room. She was in her usual state of health. She was alert and attempted some verbalization although it is unclear to personnel and this Probation officer her intentions. She did appear comfortable and in no distress. She is eating well per staff and has no skin breakdown. She has no constipation and appears not to have pain. I provided education to the staff for nonverbal signs of pain such as grimacing moaning or irritability.  I spoke with her sister who is her POA by phone. She states that she had visited yesterday and that she felt her sister was doing well. I encouraged her to call with any questions or concerns and let her know that I will revisit in two months to monitor I'm going debilitating disease process.   3. Follow up Palliative Care Visit: Palliative care will continue to follow for goals of care clarification and symptom management. Return 8 weeks or prn.  4. Family /Caregiver/Community Supports:  Sister is POA, lives in [place]  5. Cognitive / Functional decline:   Alert. Not able to interact. Dependent in all alds and iadls.  I spent 25 minutes providing this consultation,  from 1045 to 1110. More than 50% of the time in this consultation was spent coordinating communication.   CHIEF COMPLAINT: debility, goals of care  HISTORY OF PRESENT ILLNESS:  Mariah Jimenez is a 74 y.o. year old female  with debility, seizure history. She is a resident in LTC due to total dependence on others for care.   Palliative Care was asked to follow this patient by consultation request of Slade-Hartman, Ivette Loyal* to help address advance care planning and goals of care. This is a follow up visit.  CODE STATUS: DNR  PPS: 40%  HOSPICE ELIGIBILITY/DIAGNOSIS: no  ROS from Staff  General: NAD Cardiovascular: denies edema Pulmonary: denies  cough, denies increased SOB Abdomen: endorses good appetite, denies constipation, endorses incontinence of bowel GU: denies dysuria, endorses incontinence of urine MSK:  endorses ROM limitations, no falls reported Skin: denies rashes or wounds Neurological: endorses weakness, denies pain on PAINAD, denies insomnia Psych: Endorses positive mood  Physical Exam: Current and past weights:298 lbs,7 lb weight gain in 2 months Constitutional: NAD General :frail appearing, Obese  EYES: anicteric sclera,  lids intact, no discharge  ENMT: intact hearing,oral mucous membranes moist CV: S1S2, RRR, no LE edema Pulmonary: rhonchi in anterior lobes, lungs clear in bases, no increased work of breathing, no cough, no audible wheezes, room air Abdomen: intake 100%, normo-active BS +  4 quadrants, soft and non tender, no ascites GU: deferred MSK: decreased ROM in all extremities, no contractures of LE, non ambulatory Skin: warm  and dry, no rashes or wounds on visible skin Neuro: Weakness, severe cognitive impairment, Psych: non -anxious affect, A and O x 1  CURRENT PROBLEM LIST:  Patient Active Problem List   Diagnosis Date Noted  . 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 Gastric polyp   . Class 3 obesity 10/07/2019  . Sinus tachycardia 10/05/2019  . Pressure injury of skin 10/05/2019  . Dehydration 10/05/2019  . Intractable nausea and vomiting 10/04/2019  . Intellectual disability 10/04/2019  . Elevated lipase 10/04/2019  . Seizure disorder ([facility]) 08/18/2019  . UTI (urinary tract infection) 08/18/2019  . Hypothermia 08/18/2019  . Acute respiratory failure with hypoxia ([facility]) 08/18/2019  . Palliative care encounter 10/06/2018  . Debility 10/06/2018  . Tibia/fibula fracture 04/09/2016   PAST MEDICAL HISTORY:  Past Medical History:  Diagnosis Date  . Mental retardation   . Seizures ([facility])     SOCIAL HX:  Social History   Tobacco Use  . Smoking status: Never Smoker  . Smokeless tobacco: Never Used  Substance Use Topics  . Alcohol use: No   FAMILY HX:  Family History  Family history unknown: Yes    ALLERGIES: No Known Allergies   PERTINENT MEDICATIONS:  Outpatient Encounter Medications as of 01/23/2020  Medication Sig  . acetaminophen (TYLENOL) 325 MG tablet Take 650 mg by mouth every 6 (six) hours as needed.  . diphenhydrAMINE (BENADRYL) 25 MG tablet Take 25 mg by mouth every 12 (twelve) hours as needed for itching.  Marland Kitchen ipratropium-albuterol (DUONEB) 0.5-2.5 (3) MG/3ML SOLN Take 3 mLs by nebulization every 4 (four) hours as needed. PRN wheezing, SOB  . levETIRAcetam (KEPPRA) 500 MG tablet Take 1,500 mg by mouth 2 (two) times daily.  Marland Kitchen loratadine (CLARITIN) 10 MG tablet Take 10 mg by mouth daily.  . metoCLOPramide (REGLAN) 5 MG tablet Take 1 tablet (5 mg total) by mouth every 8 (eight) hours as needed for nausea or vomiting.  . Multiple Vitamin (MULTIVITAMIN WITH MINERALS) TABS tablet Take 1 tablet by mouth daily.  Marland Kitchen omeprazole (PRILOSEC) 20 MG capsule Take 1 capsule (20 mg total) by mouth daily.  . ondansetron (ZOFRAN) 4 MG tablet Take 1 tablet (4 mg total) by mouth daily as needed.  Marland Kitchen PHENobarbital  (LUMINAL) 16.2 MG tablet Take 3 tablets (48.6 mg total) by mouth 3 (three) times daily.  . phenytoin (DILANTIN) 100 MG ER capsule Take 400 mg by mouth at bedtime.   . phenytoin (DILANTIN) 100 MG ER capsule Take 200 mg by mouth daily.  . SPS 15 GM/60ML suspension Take 60 mLs by mouth every Wednesday.  . Triamcinolone Acetonide (TRIAMCINOLONE 0.1 % CREAM : EUCERIN) CREA Apply 1 application topically 3 (three) times daily.    No facility-administered encounter medications on file as of 01/23/2020.      Jason Coop, NP , DNP, MPH, AGPCNP-BC, ACHPN  COVID-19 PATIENT SCREENING TOOL  Person answering questions: ____________staff______ _____   1.  Is the patient or any family member in the home showing any signs or symptoms regarding respiratory infection?               Person with Symptom- __________NA_________________  a. Fever  Yes___ No___          ___________________  b. Shortness of breath                                                    Yes___ No___          ___________________ c. Cough/congestion                                       Yes___  No___         ___________________ d. Body aches/pains                                                         Yes___ No___        ____________________ e. Gastrointestinal symptoms (diarrhea, nausea)           Yes___ No___        ____________________  2. Within the past 14 days, has anyone living in the home had any contact with someone with or under investigation for COVID-19?    Yes___ No_X_   Person __________________

## 2020-01-29 DIAGNOSIS — Z23 Encounter for immunization: Secondary | ICD-10-CM | POA: Diagnosis not present

## 2020-02-18 DIAGNOSIS — F71 Moderate intellectual disabilities: Secondary | ICD-10-CM | POA: Diagnosis not present

## 2020-03-12 DIAGNOSIS — I739 Peripheral vascular disease, unspecified: Secondary | ICD-10-CM | POA: Diagnosis not present

## 2020-03-12 DIAGNOSIS — F78A9 Other genetic related intellectual disability: Secondary | ICD-10-CM | POA: Diagnosis not present

## 2020-03-12 DIAGNOSIS — R609 Edema, unspecified: Secondary | ICD-10-CM | POA: Diagnosis not present

## 2020-03-12 DIAGNOSIS — R198 Other specified symptoms and signs involving the digestive system and abdomen: Secondary | ICD-10-CM | POA: Diagnosis not present

## 2020-03-12 DIAGNOSIS — E118 Type 2 diabetes mellitus with unspecified complications: Secondary | ICD-10-CM | POA: Diagnosis not present

## 2020-03-12 DIAGNOSIS — B351 Tinea unguium: Secondary | ICD-10-CM | POA: Diagnosis not present

## 2020-03-17 DIAGNOSIS — F71 Moderate intellectual disabilities: Secondary | ICD-10-CM | POA: Diagnosis not present

## 2020-03-18 ENCOUNTER — Non-Acute Institutional Stay: Payer: Medicare Other | Admitting: Primary Care

## 2020-03-18 ENCOUNTER — Other Ambulatory Visit: Payer: Self-pay

## 2020-03-18 DIAGNOSIS — F79 Unspecified intellectual disabilities: Secondary | ICD-10-CM

## 2020-03-18 DIAGNOSIS — Z515 Encounter for palliative care: Secondary | ICD-10-CM | POA: Diagnosis not present

## 2020-03-18 DIAGNOSIS — G40909 Epilepsy, unspecified, not intractable, without status epilepticus: Secondary | ICD-10-CM

## 2020-03-18 NOTE — Progress Notes (Signed)
[facility] Consult Note Telephone: [phone]  Fax: [phone]     Date of encounter: 03/18/20 PATIENT NAME: Mariah Jimenez [id number] [phone] (home)  DOB: [birth year] MRN: [id number]  PRIMARY CARE PROVIDER:    Alvester Morin, MD,  [address]. [place] [address] [phone]  REFERRING PROVIDER:   Alvester Morin, MD [address]. [place],  [place] [address] [phone]  RESPONSIBLE PARTY:   Extended Emergency Contact Information Primary Emergency Contact: Jimenez,Mariah Mobile Phone: [phone] Relation: Sister  I met face to face with patient  in facility. Palliative Care was asked to follow this patient by consultation request of Slade-Hartman, Mariah Jimenez* to help address advance care planning and goals of care. This is a follow up  visit.   ASSESSMENT AND RECOMMENDATIONS:   1. Advance Care Planning/Goals of Care: Goals include to maximize quality of life and symptom management. Our advance care planning conversation included a discussion about:     Exploration of personal, cultural or spiritual beliefs that might influence medical decisions   Exploration of goals of care in the event of a sudden injury or illness   DNR on record  2. Symptom Management:   I saw Mariah Jimenez in her room. She was awake and alert. I was not able to understand her speech. She seemed as if however she was comfortable. No distress she was relaxed and her breathing was normal rhythm and right. Physical exam was within normal limits please see below. I talked with her sister who did state some concern about her sleeping more and being less interactive. Mariah Jimenez stated she would see her sister next week, as her birthday is coming up. She stated that she understood her sleeping more could be her slowing down with her illness and her age. I will continue to check in for treatable  signs of decline versus natural disease progression. Staff endorse  that she's sleeping more but eating at her baseline and does not appear uncomfortable.  3. Follow up Palliative Care Visit: Palliative care will continue to follow for goals of care clarification and symptom management. Return 73 weeks or prn.  4. Family /Caregiver/Community Supports: Sister Bertram Millard is only relative. Lives in [place].  5. Cognitive / Functional decline: A and O x 1, non verbal. Dependent in all alds and iadls.   I spent 25 minutes providing this consultation,  from 1000 to 1025. More than 50% of the time in this consultation was spent coordinating communication.   CODE STATUS:DNR  PPS: 40%  HOSPICE ELIGIBILITY/DIAGNOSIS: TBD  Subjective:  CHIEF COMPLAINT: debility  HISTORY OF PRESENT ILLNESS:  Mariah Jimenez is a 74 y.o. year old female  with developmental delays, debility. Staff states she is sleeping more. She must be fed.   We are asked to consult around advance care planning and complex medical decision making.   History obtained from review of EMR, discussion with primary team, and  interview with family, caregiver  and/or Mariah Jimenez. Records reviewed and summarized above.   CURRENT PROBLEM LIST:  Patient Active Problem List   Diagnosis Date Noted  . 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 Gastric polyp   . Class 3 obesity 10/07/2019  . Sinus tachycardia 10/05/2019  . Pressure injury of skin 10/05/2019  . Dehydration 10/05/2019  . Intractable nausea and vomiting 10/04/2019  . Intellectual disability 10/04/2019  . Elevated lipase 10/04/2019  . Seizure disorder ([facility]) 08/18/2019  . UTI (urinary tract  infection) 08/18/2019  . Hypothermia 08/18/2019  . Acute respiratory failure with hypoxia ([facility]) 08/18/2019  . Palliative care encounter 10/06/2018  . Debility 10/06/2018  . Tibia/fibula fracture 04/09/2016   PAST MEDICAL HISTORY:  Active Ambulatory Problems    Diagnosis Date Noted  .  Tibia/fibula fracture 04/09/2016  . Palliative care encounter 10/06/2018  . Debility 10/06/2018  . Seizure disorder ([facility]) 08/18/2019  . UTI (urinary tract infection) 08/18/2019  . Hypothermia 08/18/2019  . Acute respiratory failure with hypoxia ([facility]) 08/18/2019  . Intractable nausea and vomiting 10/04/2019  . Intellectual disability 10/04/2019  . Elevated lipase 10/04/2019  . Sinus tachycardia 10/05/2019  . Pressure injury of skin 10/05/2019  . Dehydration 10/05/2019  . Class 3 obesity 10/07/2019  . Gastric polyp   . 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 No Resolved Ambulatory Problems   Past Medical History:  Diagnosis Date  . Mental retardation   . Seizures ([facility])    SOCIAL HX:  Social History   Tobacco Use  . Smoking status: Never Smoker  . Smokeless tobacco: Never Used  Substance Use Topics  . Alcohol use: No   FAMILY HX:  Family History  Family history unknown: Yes      ALLERGIES: No Known Allergies   PERTINENT MEDICATIONS:  Outpatient Encounter Medications as of 03/18/2020  Medication Sig  . acetaminophen (TYLENOL) 325 MG tablet Take 650 mg by mouth every 6 (six) hours as needed.  . diphenhydrAMINE (BENADRYL) 25 MG tablet Take 25 mg by mouth every 12 (twelve) hours as needed for itching.  Marland Kitchen ipratropium-albuterol (DUONEB) 0.5-2.5 (73) MG/3ML SOLN Take 3 mLs by nebulization every 4 (four) hours as needed. PRN wheezing, SOB  . levETIRAcetam (KEPPRA) 500 MG tablet Take 1,500 mg by mouth 2 (two) times daily.  Marland Kitchen loratadine (CLARITIN) 10 MG tablet Take 10 mg by mouth daily.  . Multiple Vitamin (MULTIVITAMIN WITH MINERALS) TABS tablet Take 1 tablet by mouth daily.  Marland Kitchen omeprazole (PRILOSEC) 20 MG capsule Take 1 capsule (20 mg total) by mouth daily.  Marland Kitchen PHENobarbital (LUMINAL) 16.2 MG tablet Take 3 tablets (48.6 mg total) by mouth 3 (three) times daily.  . phenytoin (DILANTIN) 100 MG ER capsule Take 400 mg by mouth  at bedtime.   . phenytoin (DILANTIN) 100 MG ER capsule Take 200 mg by mouth daily.  . polyethylene glycol (MIRALAX / GLYCOLAX) 17 g packet Take 17 g by mouth daily as needed for moderate constipation.  . SPS 15 GM/60ML suspension Take 60 mLs by mouth every Wednesday.  . Triamcinolone Acetonide (TRIAMCINOLONE 0.1 % CREAM : EUCERIN) CREA Apply 1 application topically 3 (three) times daily.   . [DISCONTINUED] metoCLOPramide (REGLAN) 5 MG tablet Take 1 tablet (5 mg total) by mouth every 8 (eight) hours as needed for nausea or vomiting.  . [DISCONTINUED] ondansetron (ZOFRAN) 4 MG tablet Take 1 tablet (4 mg total) by mouth daily as needed.   No facility-administered encounter medications on file as of 03/18/2020.     Objective: ROS/staff report General: NAD ENMT: denies dysphagia Cardiovascular: denies chest pain Pulmonary: denies  cough, denies increased SOB Abdomen: endorses fair appetite, endorses occ  constipation, endorses incontinence of bowel GU:  endorses incontinence of urine MSK:  endorses ROM limitations, no falls reported Skin: denies rashes or wounds Neurological: endorses weakness, occ  pain, denies insomnia , sleeping more Psych: unable to interact with this writer Heme/lymph/immuno: denies bruises, abnormal bleeding  Physical Exam: Current  and past weights:296 lbs, wt gain of 2 lbs Constitutional: NAD General: frail appearing, obese  EYES: anicteric sclera,lids intact, no discharge  ENMT: intact hearing,oral mucous membranes moist, lip protrusion, edentulous CV: S1S2, RRR, no LE edema Pulmonary: LCTA, no increased work of breathing, no cough, no audible wheezes, room air Abdomen: intake 75-100%, normo-active BS +  4 quadrants, soft and non tender, no ascites GU: deferred MSK:  decreased ROM in all extremities, no contractures of LE, non ambulatory Skin: warm and dry, no rashes or wounds on visible skin Neuro: Generalized weakness, severe cognitive impairment Psych:  non-anxious affect, A and O x 1 Hem/lymph/immuno: no widespread bruising   Thank you for the opportunity to participate in the care of Mariah Jimenez.  The palliative care team will continue to follow. Please call our office at [phone] if we can be of additional assistance.  Jason Coop, NP , DNP, MPH, AGPCNP-BC, ACHPN  COVID-19 PATIENT SCREENING TOOL  Person answering questions: ____________staff______ _____   1.  Is the patient or any family member in the home showing any signs or symptoms regarding respiratory infection?               Person with Symptom- __________NA_________________  a. Fever                                                                          Yes___ No___          ___________________  b. Shortness of breath                                                    Yes___ No___          ___________________ c. Cough/congestion                                       Yes___  No___         ___________________ d. Body aches/pains                                                         Yes___ No___        ____________________ e. Gastrointestinal symptoms (diarrhea, nausea)           Yes___ No___        ____________________  2. Within the past 14 days, has anyone living in the home had any contact with someone with or under investigation for COVID-19?    Yes___ No_X_   Person __________________

## 2020-03-20 DIAGNOSIS — Z7189 Other specified counseling: Secondary | ICD-10-CM | POA: Diagnosis not present

## 2020-03-20 DIAGNOSIS — F79 Unspecified intellectual disabilities: Secondary | ICD-10-CM | POA: Diagnosis not present

## 2020-03-20 DIAGNOSIS — N184 Chronic kidney disease, stage 4 (severe): Secondary | ICD-10-CM | POA: Diagnosis not present

## 2020-03-20 DIAGNOSIS — E119 Type 2 diabetes mellitus without complications: Secondary | ICD-10-CM | POA: Diagnosis not present

## 2020-03-20 DIAGNOSIS — G4089 Other seizures: Secondary | ICD-10-CM | POA: Diagnosis not present

## 2020-03-20 DIAGNOSIS — I10 Essential (primary) hypertension: Secondary | ICD-10-CM | POA: Diagnosis not present

## 2020-03-20 DIAGNOSIS — K219 Gastro-esophageal reflux disease without esophagitis: Secondary | ICD-10-CM | POA: Diagnosis not present

## 2020-05-16 DIAGNOSIS — I10 Essential (primary) hypertension: Secondary | ICD-10-CM | POA: Diagnosis not present

## 2020-05-16 DIAGNOSIS — N184 Chronic kidney disease, stage 4 (severe): Secondary | ICD-10-CM | POA: Diagnosis not present

## 2020-05-16 DIAGNOSIS — G4089 Other seizures: Secondary | ICD-10-CM | POA: Diagnosis not present

## 2020-05-16 DIAGNOSIS — K219 Gastro-esophageal reflux disease without esophagitis: Secondary | ICD-10-CM | POA: Diagnosis not present

## 2020-05-16 DIAGNOSIS — E119 Type 2 diabetes mellitus without complications: Secondary | ICD-10-CM | POA: Diagnosis not present

## 2020-05-21 ENCOUNTER — Other Ambulatory Visit: Payer: Self-pay

## 2020-05-21 ENCOUNTER — Non-Acute Institutional Stay: Payer: Medicare Other | Admitting: Primary Care

## 2020-05-21 DIAGNOSIS — G40909 Epilepsy, unspecified, not intractable, without status epilepticus: Secondary | ICD-10-CM | POA: Diagnosis not present

## 2020-05-21 DIAGNOSIS — R569 Unspecified convulsions: Secondary | ICD-10-CM | POA: Diagnosis not present

## 2020-05-21 DIAGNOSIS — Z515 Encounter for palliative care: Secondary | ICD-10-CM

## 2020-05-21 DIAGNOSIS — R5381 Other malaise: Secondary | ICD-10-CM | POA: Diagnosis not present

## 2020-05-21 NOTE — Progress Notes (Signed)
[facility] Pall[facility] Consult Note Telephone: [phone]  Fax: [phone]   TELEHEALTH VISIT STATEMENT Due to the COVID-19 crisis, this visit was done via telemedicine from my office. It was initiated and consented to by this patient and/or family.   Date of encounter: 05/21/20 PATIENT NAME: Mariah Jimenez [address] [place] [address] [phone] (home)  DOB: [birth year] MRN: [id number]  PRIMARY CARE PROVIDER:    Keane Police, MD,  [phone] [place] [address] [phone]  REFERRING PROVIDER:   Keane Police, MD [phone] [place],  [place] [address] [phone]  RESPONSIBLE PARTY:   Extended Emergency Contact Information Primary Emergency Contact: Peters,ruby Mobile Phone: [phone] Relation: Sister   Palliative Care was asked to follow this patient by consultation request of Keane Police* to help address advance care planning and goals of care. This is a follow up  visit.   ASSESSMENT AND RECOMMENDATIONS:   1. Advance Care Planning/Goals of Care: Goals include to maximize quality of life and symptom management. DNR on record.  2. Symptom Management:  Patient is at her baseline, She eats at her usual level. There have been no falls or infections. She appears comfortable to the staff.   3. Follow up Palliative Care Visit: Palliative care will continue to follow for goals of care clarification and symptom management. Return 6-8 weeks or prn.  4. Family /Caregiver/Community Supports:  Family is POA. Lives in LTC.   5. Cognitive / Functional decline: A and O x 1, dependent in all alds.   I spent 15 minutes providing this consultation,  from 1500 to 1515. More than 50% of the time in this consultation was spent in counseling and care coordination.  CODE STATUS: DNR  PPS: 40%  HOSPICE ELIGIBILITY/DIAGNOSIS: TBD  Subjective:  CHIEF COMPLAINT:  debility  HISTORY OF PRESENT ILLNESS:  Mariah Jimenez is a 75 y.o. year old female  with h/o seizure disorder, developmental delay, debility.  We are asked to consult around advance care planning and complex medical decision making.   Review and summarization of old Epic records shows or history from other than patient.  History obtained from review of EMR, discussion with primary team, and  interview with family, caregiver  and/or Mariah Jimenez. Records reviewed and summarized above.   CURRENT PROBLEM LIST:  Patient Active Problem List   Diagnosis Date Noted  . 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 Gastric polyp   . Class 3 obesity 10/07/2019  . Sinus tachycardia 10/05/2019  . Pressure injury of skin 10/05/2019  . Dehydration 10/05/2019  . Intractable nausea and vomiting 10/04/2019  . Intellectual disability 10/04/2019  . Elevated lipase 10/04/2019  . Seizure disorder ([facility]) 08/18/2019  . UTI (urinary tract infection) 08/18/2019  . Hypothermia 08/18/2019  . Acute respiratory failure with hypoxia ([facility]) 08/18/2019  . Palliative care encounter 10/06/2018  . Debility 10/06/2018  . Tibia/fibula fracture 04/09/2016   PAST MEDICAL HISTORY:  Active Ambulatory Problems    Diagnosis Date Noted  . Tibia/fibula fracture 04/09/2016  . Palliative care encounter 10/06/2018  . Debility 10/06/2018  . Seizure disorder ([facility]) 08/18/2019  . UTI (urinary tract infection) 08/18/2019  . Hypothermia 08/18/2019  . Acute respiratory failure with hypoxia ([facility]) 08/18/2019  . Intractable nausea and vomiting 10/04/2019  . Intellectual disability 10/04/2019  . Elevated lipase 10/04/2019  . Sinus tachycardia 10/05/2019  . Pressure injury of skin 10/05/2019  . Dehydration 10/05/2019  . Class 3 obesity 10/07/2019  . Gastric polyp   .  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 No Resolved Ambulatory Problems   Past Medical History:  Diagnosis Date   . Mental retardation   . Seizures ([facility])    SOCIAL HX:  Social History   Tobacco Use  . Smoking status: Never Smoker  . Smokeless tobacco: Never Used  Substance Use Topics  . Alcohol use: No   FAMILY HX:  Family History  Family history unknown: Yes      ALLERGIES: No Known Allergies   PERTINENT MEDICATIONS:  Outpatient Encounter Medications as of 05/21/2020  Medication Sig  . acetaminophen (TYLENOL) 325 MG tablet Take 650 mg by mouth every 6 (six) hours as needed.  . diphenhydrAMINE (BENADRYL) 25 MG tablet Take 25 mg by mouth every 12 (twelve) hours as needed for itching.  Marland Kitchen ipratropium-albuterol (DUONEB) 0.5-2.5 (3) MG/3ML SOLN Take 3 mLs by nebulization every 4 (four) hours as needed. PRN wheezing, SOB  . levETIRAcetam (KEPPRA) 500 MG tablet Take 1,500 mg by mouth 2 (two) times daily.  Marland Kitchen loratadine (CLARITIN) 10 MG tablet Take 10 mg by mouth daily.  . Multiple Vitamin (MULTIVITAMIN WITH MINERALS) TABS tablet Take 1 tablet by mouth daily.  Marland Kitchen omeprazole (PRILOSEC) 20 MG capsule Take 1 capsule (20 mg total) by mouth daily.  Marland Kitchen PHENobarbital (LUMINAL) 16.2 MG tablet Take 3 tablets (48.6 mg total) by mouth 3 (three) times daily.  . phenytoin (DILANTIN) 100 MG ER capsule Take 400 mg by mouth at bedtime.   . phenytoin (DILANTIN) 100 MG ER capsule Take 200 mg by mouth daily.  . polyethylene glycol (MIRALAX / GLYCOLAX) 17 g packet Take 17 g by mouth daily as needed for moderate constipation.  . SPS 15 GM/60ML suspension Take 60 mLs by mouth every Wednesday.  . Triamcinolone Acetonide (TRIAMCINOLONE 0.1 % CREAM : EUCERIN) CREA Apply 1 application topically 3 (three) times daily.    No facility-administered encounter medications on file as of 05/21/2020.    Objective: ROS  General: NAD EYES: denies vision changes, wears glasses ENMT: denies dysphagia Cardiovascular: denies chest pain Pulmonary: denies  cough, denies increased SOB Abdomen: endorses fair appetite, endorses  constipation, endorses continence of bowel GU: denies dysuria, endorses continence of urine MSK:  endorses ROM limitations, no falls reported Skin: denies rashes or wounds Neurological: endorses weakness, denies pain, denies insomnia Psych: Endorses positive mood Heme/lymph/immuno: denies bruises, abnormal bleeding  Current and past weights: 291 lbs, 7/21 = 284 lb, gain of 7 lbs Constitutional: HR 101 RR 18 BP 164/80  Physical Exam: Deferred  Thank you for the opportunity to participate in the care of Ms. Ahuja.  The palliative care team will continue to follow. Please call our office at [phone] if we can be of additional assistance.  Eliezer Lofts, NP , DNP, MPH, AGPCNP-BC, [facility]

## 2020-06-11 DIAGNOSIS — R945 Abnormal results of liver function studies: Secondary | ICD-10-CM | POA: Diagnosis not present

## 2020-06-11 DIAGNOSIS — Z79899 Other long term (current) drug therapy: Secondary | ICD-10-CM | POA: Diagnosis not present

## 2020-06-11 DIAGNOSIS — D649 Anemia, unspecified: Secondary | ICD-10-CM | POA: Diagnosis not present

## 2020-06-12 DIAGNOSIS — Z79899 Other long term (current) drug therapy: Secondary | ICD-10-CM | POA: Diagnosis not present

## 2020-06-30 DIAGNOSIS — Z79899 Other long term (current) drug therapy: Secondary | ICD-10-CM | POA: Diagnosis not present

## 2020-07-17 DIAGNOSIS — N184 Chronic kidney disease, stage 4 (severe): Secondary | ICD-10-CM | POA: Diagnosis not present

## 2020-07-17 DIAGNOSIS — G40909 Epilepsy, unspecified, not intractable, without status epilepticus: Secondary | ICD-10-CM | POA: Diagnosis not present

## 2020-07-17 DIAGNOSIS — E119 Type 2 diabetes mellitus without complications: Secondary | ICD-10-CM | POA: Diagnosis not present

## 2020-07-17 DIAGNOSIS — N760 Acute vaginitis: Secondary | ICD-10-CM | POA: Diagnosis not present

## 2020-07-22 DIAGNOSIS — Z79899 Other long term (current) drug therapy: Secondary | ICD-10-CM | POA: Diagnosis not present

## 2020-07-24 DIAGNOSIS — Z23 Encounter for immunization: Secondary | ICD-10-CM | POA: Diagnosis not present

## 2020-07-30 DIAGNOSIS — Z79899 Other long term (current) drug therapy: Secondary | ICD-10-CM | POA: Diagnosis not present

## 2020-08-08 DIAGNOSIS — I10 Essential (primary) hypertension: Secondary | ICD-10-CM | POA: Diagnosis not present

## 2020-08-08 DIAGNOSIS — K219 Gastro-esophageal reflux disease without esophagitis: Secondary | ICD-10-CM | POA: Diagnosis not present

## 2020-08-08 DIAGNOSIS — G40909 Epilepsy, unspecified, not intractable, without status epilepticus: Secondary | ICD-10-CM | POA: Diagnosis not present

## 2020-08-08 DIAGNOSIS — N184 Chronic kidney disease, stage 4 (severe): Secondary | ICD-10-CM | POA: Diagnosis not present

## 2020-08-12 ENCOUNTER — Encounter: Payer: Self-pay | Admitting: Radiology

## 2020-08-12 ENCOUNTER — Emergency Department: Payer: Medicare Other

## 2020-08-12 ENCOUNTER — Inpatient Hospital Stay
Admission: EM | Admit: 2020-08-12 | Discharge: 2020-08-14 | DRG: 101 | Disposition: A | Discharge status: Skilled Nursing Facility | Payer: Medicare Other | Source: Skilled Nursing Facility | Attending: Internal Medicine | Admitting: Internal Medicine

## 2020-08-12 ENCOUNTER — Other Ambulatory Visit: Payer: Self-pay

## 2020-08-12 DIAGNOSIS — E876 Hypokalemia: Secondary | ICD-10-CM | POA: Diagnosis not present

## 2020-08-12 DIAGNOSIS — E66813 Obesity, class 3: Secondary | ICD-10-CM | POA: Diagnosis present

## 2020-08-12 DIAGNOSIS — J9811 Atelectasis: Secondary | ICD-10-CM | POA: Diagnosis present

## 2020-08-12 DIAGNOSIS — G40909 Epilepsy, unspecified, not intractable, without status epilepticus: Principal | ICD-10-CM

## 2020-08-12 DIAGNOSIS — R221 Localized swelling, mass and lump, neck: Secondary | ICD-10-CM | POA: Diagnosis not present

## 2020-08-12 DIAGNOSIS — J320 Chronic maxillary sinusitis: Secondary | ICD-10-CM | POA: Diagnosis not present

## 2020-08-12 DIAGNOSIS — G9340 Encephalopathy, unspecified: Secondary | ICD-10-CM | POA: Diagnosis not present

## 2020-08-12 DIAGNOSIS — Z66 Do not resuscitate: Secondary | ICD-10-CM | POA: Diagnosis not present

## 2020-08-12 DIAGNOSIS — R569 Unspecified convulsions: Secondary | ICD-10-CM | POA: Diagnosis not present

## 2020-08-12 DIAGNOSIS — R400 Somnolence: Secondary | ICD-10-CM | POA: Diagnosis not present

## 2020-08-12 DIAGNOSIS — R404 Transient alteration of awareness: Secondary | ICD-10-CM | POA: Diagnosis not present

## 2020-08-12 DIAGNOSIS — Z6841 Body Mass Index (BMI) 40.0 and over, adult: Secondary | ICD-10-CM | POA: Diagnosis not present

## 2020-08-12 DIAGNOSIS — F840 Autistic disorder: Secondary | ICD-10-CM | POA: Diagnosis not present

## 2020-08-12 DIAGNOSIS — F79 Unspecified intellectual disabilities: Secondary | ICD-10-CM | POA: Diagnosis not present

## 2020-08-12 DIAGNOSIS — E669 Obesity, unspecified: Secondary | ICD-10-CM | POA: Diagnosis present

## 2020-08-12 DIAGNOSIS — I959 Hypotension, unspecified: Secondary | ICD-10-CM | POA: Diagnosis present

## 2020-08-12 DIAGNOSIS — Z20822 Contact with and (suspected) exposure to covid-19: Secondary | ICD-10-CM | POA: Diagnosis not present

## 2020-08-12 DIAGNOSIS — Z79899 Other long term (current) drug therapy: Secondary | ICD-10-CM | POA: Diagnosis not present

## 2020-08-12 DIAGNOSIS — N390 Urinary tract infection, site not specified: Secondary | ICD-10-CM | POA: Diagnosis not present

## 2020-08-12 DIAGNOSIS — R220 Localized swelling, mass and lump, head: Secondary | ICD-10-CM | POA: Diagnosis not present

## 2020-08-12 DIAGNOSIS — I10 Essential (primary) hypertension: Secondary | ICD-10-CM | POA: Diagnosis not present

## 2020-08-12 DIAGNOSIS — R000 Tachycardia, unspecified: Secondary | ICD-10-CM | POA: Diagnosis not present

## 2020-08-12 LAB — URINALYSIS, COMPLETE (UACMP) WITH MICROSCOPIC
Bilirubin Urine: NEGATIVE
Glucose, UA: NEGATIVE mg/dL
Ketones, ur: NEGATIVE mg/dL
Nitrite: NEGATIVE
Protein, ur: NEGATIVE mg/dL
Specific Gravity, Urine: 1.015 (ref 1.005–1.030)
WBC, UA: >50 WBC/hpf — ABNORMAL HIGH (ref 0–5)
pH: 5 (ref 5.0–8.0)

## 2020-08-12 LAB — URINE DRUG SCREEN, QUALITATIVE (ARMC ONLY)
Amphetamines, Ur Screen: NOT DETECTED
Barbiturates, Ur Screen: POSITIVE — AB
Benzodiazepine, Ur Scrn: NOT DETECTED
Cannabinoid 50 Ng, Ur ~~LOC~~: NOT DETECTED
Cocaine Metabolite,Ur ~~LOC~~: NOT DETECTED
MDMA (Ecstasy)Ur Screen: NOT DETECTED
Methadone Scn, Ur: NOT DETECTED
Opiate, Ur Screen: NOT DETECTED
Phencyclidine (PCP) Ur S: NOT DETECTED
Tricyclic, Ur Screen: POSITIVE — AB

## 2020-08-12 LAB — COMPREHENSIVE METABOLIC PANEL
ALT: 19 U/L (ref 0–44)
AST: 21 U/L (ref 15–41)
Albumin: 2.8 g/dL — ABNORMAL LOW (ref 3.5–5.0)
Alkaline Phosphatase: 196 U/L — ABNORMAL HIGH (ref 38–126)
Anion gap: 8 (ref 5–15)
BUN: 14 mg/dL (ref 8–23)
CO2: 26 mmol/L (ref 22–32)
Calcium: 8.5 mg/dL — ABNORMAL LOW (ref 8.9–10.3)
Chloride: 108 mmol/L (ref 98–111)
Creatinine, Ser: 0.5 mg/dL (ref 0.44–1.00)
GFR, Estimated: >60 mL/min (ref >60)
Glucose, Bld: 126 mg/dL — ABNORMAL HIGH (ref 70–99)
Potassium: 3.3 mmol/L — ABNORMAL LOW (ref 3.5–5.1)
Sodium: 142 mmol/L (ref 135–145)
Total Bilirubin: 0.3 mg/dL (ref 0.3–1.2)
Total Protein: 7.4 g/dL (ref 6.5–8.1)

## 2020-08-12 LAB — CBC WITH DIFFERENTIAL/PLATELET
Abs Immature Granulocytes: 0.05 10*3/uL (ref 0.00–0.07)
Basophils Absolute: 0 10*3/uL (ref 0.0–0.1)
Basophils Relative: 1 %
Eosinophils Absolute: 0.2 10*3/uL (ref 0.0–0.5)
Eosinophils Relative: 3 %
HCT: 33.4 % — ABNORMAL LOW (ref 36.0–46.0)
Hemoglobin: 10.8 g/dL — ABNORMAL LOW (ref 12.0–15.0)
Immature Granulocytes: 1 %
Lymphocytes Relative: 31 %
Lymphs Abs: 1.8 10*3/uL (ref 0.7–4.0)
MCH: 29 pg (ref 26.0–34.0)
MCHC: 32.3 g/dL (ref 30.0–36.0)
MCV: 89.5 fL (ref 80.0–100.0)
Monocytes Absolute: 0.2 10*3/uL (ref 0.1–1.0)
Monocytes Relative: 4 %
Neutro Abs: 3.7 10*3/uL (ref 1.7–7.7)
Neutrophils Relative %: 60 %
Platelets: 307 10*3/uL (ref 150–400)
RBC: 3.73 MIL/uL — ABNORMAL LOW (ref 3.87–5.11)
RDW: 15.5 % (ref 11.5–15.5)
WBC: 6 10*3/uL (ref 4.0–10.5)
nRBC: 0 % (ref 0.0–0.2)

## 2020-08-12 LAB — BLOOD GAS, VENOUS
Acid-Base Excess: 3.2 mmol/L — ABNORMAL HIGH (ref 0.0–2.0)
Bicarbonate: 30.1 mmol/L — ABNORMAL HIGH (ref 20.0–28.0)
O2 Saturation: 73.4 %
Patient temperature: 37
pCO2, Ven: 57 mmHg (ref 44.0–60.0)
pH, Ven: 7.33 (ref 7.250–7.430)
pO2, Ven: 42 mmHg (ref 32.0–45.0)

## 2020-08-12 LAB — CBC
HCT: 32.3 % — ABNORMAL LOW (ref 36.0–46.0)
Hemoglobin: 10.2 g/dL — ABNORMAL LOW (ref 12.0–15.0)
MCH: 28.5 pg (ref 26.0–34.0)
MCHC: 31.6 g/dL (ref 30.0–36.0)
MCV: 90.2 fL (ref 80.0–100.0)
Platelets: 288 10*3/uL (ref 150–400)
RBC: 3.58 MIL/uL — ABNORMAL LOW (ref 3.87–5.11)
RDW: 15.2 % (ref 11.5–15.5)
WBC: 7.2 10*3/uL (ref 4.0–10.5)
nRBC: 0 % (ref 0.0–0.2)

## 2020-08-12 LAB — CBG MONITORING, ED: Glucose-Capillary: 150 mg/dL — ABNORMAL HIGH (ref 70–99)

## 2020-08-12 LAB — RESP PANEL BY RT-PCR (FLU A&B, COVID) ARPGX2
Influenza A by PCR: NEGATIVE
Influenza B by PCR: NEGATIVE
SARS Coronavirus 2 by RT PCR: NEGATIVE

## 2020-08-12 LAB — PHENYTOIN LEVEL, TOTAL: Phenytoin Lvl: 25.4 ug/mL — ABNORMAL HIGH (ref 10.0–20.0)

## 2020-08-12 LAB — PHENOBARBITAL LEVEL: Phenobarbital: 42.6 ug/mL — ABNORMAL HIGH (ref 15.0–30.0)

## 2020-08-12 LAB — CK: Total CK: 56 U/L (ref 38–234)

## 2020-08-12 MED ORDER — SODIUM CHLORIDE 0.9 % IV SOLN
1.0000 g | Freq: Once | INTRAVENOUS | Status: AC
  Administered 2020-08-12: 1 g via INTRAVENOUS
  Filled 2020-08-12: qty 10

## 2020-08-12 MED ORDER — SODIUM CHLORIDE 0.9 % IV SOLN
200.0000 mg | Freq: Three times a day (TID) | INTRAVENOUS | Status: DC
  Administered 2020-08-12: 200 mg via INTRAVENOUS
  Filled 2020-08-12 (×3): qty 4

## 2020-08-12 MED ORDER — BISACODYL 10 MG RE SUPP
10.0000 mg | Freq: Every day | RECTAL | Status: DC | PRN
  Filled 2020-08-12: qty 1

## 2020-08-12 MED ORDER — SODIUM CHLORIDE 0.9 % IV SOLN
2000.0000 mg | Freq: Once | INTRAVENOUS | Status: AC
  Administered 2020-08-12: 2000 mg via INTRAVENOUS
  Filled 2020-08-12: qty 20

## 2020-08-12 MED ORDER — FLEET ENEMA 7-19 GM/118ML RE ENEM: 1.0000 | ENEMA | Freq: Once | RECTAL | Status: DC | PRN

## 2020-08-12 MED ORDER — SODIUM CHLORIDE 0.9 % IV SOLN
200.0000 mg | Freq: Three times a day (TID) | INTRAVENOUS | Status: DC
  Administered 2020-08-13 – 2020-08-14 (×4): 200 mg via INTRAVENOUS
  Filled 2020-08-12 (×7): qty 4

## 2020-08-12 MED ORDER — IOHEXOL 300 MG/ML  SOLN
75.0000 mL | Freq: Once | INTRAMUSCULAR | Status: AC | PRN
  Administered 2020-08-12: 75 mL via INTRAVENOUS

## 2020-08-12 MED ORDER — SODIUM CHLORIDE 0.9 % IV BOLUS
1000.0000 mL | Freq: Once | INTRAVENOUS | Status: AC
  Administered 2020-08-12: 1000 mL via INTRAVENOUS

## 2020-08-12 MED ORDER — PHENOBARBITAL SODIUM 65 MG/ML IJ SOLN
48.6000 mg | Freq: Three times a day (TID) | INTRAMUSCULAR | Status: DC
  Administered 2020-08-13: 48.6 mg via INTRAVENOUS
  Filled 2020-08-12: qty 0.75

## 2020-08-12 MED ORDER — POTASSIUM CHLORIDE 10 MEQ/100ML IV SOLN: 10.0000 meq | INTRAVENOUS | Status: AC

## 2020-08-12 MED ORDER — POTASSIUM CHLORIDE IN NACL 20-0.9 MEQ/L-% IV SOLN
INTRAVENOUS | Status: DC
  Filled 2020-08-12 (×5): qty 1000

## 2020-08-12 MED ORDER — SODIUM CHLORIDE 0.9 % IV SOLN
1.0000 g | INTRAVENOUS | Status: DC
  Administered 2020-08-13: 18:00:00 1 g via INTRAVENOUS
  Filled 2020-08-12: qty 10
  Filled 2020-08-12: qty 1

## 2020-08-12 MED ORDER — IPRATROPIUM-ALBUTEROL 0.5-2.5 (3) MG/3ML IN SOLN: 3.0000 mL | RESPIRATORY_TRACT | Status: DC | PRN

## 2020-08-12 MED ORDER — ACETAMINOPHEN 325 MG PO TABS: 650.0000 mg | ORAL_TABLET | Freq: Four times a day (QID) | ORAL | Status: DC | PRN

## 2020-08-12 MED ORDER — ACETAMINOPHEN 650 MG RE SUPP: 650.0000 mg | Freq: Four times a day (QID) | RECTAL | Status: DC | PRN

## 2020-08-12 MED ORDER — ENOXAPARIN SODIUM 80 MG/0.8ML IJ SOSY
0.5000 mg/kg | PREFILLED_SYRINGE | INTRAMUSCULAR | Status: DC
  Administered 2020-08-13 (×2): 65 mg via SUBCUTANEOUS
  Filled 2020-08-12 (×4): qty 0.65

## 2020-08-12 MED ORDER — LEVETIRACETAM IN NACL 1500 MG/100ML IV SOLN
1500.0000 mg | Freq: Two times a day (BID) | INTRAVENOUS | Status: DC
  Administered 2020-08-13 – 2020-08-14 (×3): 1500 mg via INTRAVENOUS
  Filled 2020-08-12 (×4): qty 100

## 2020-08-12 NOTE — ED Notes (Signed)
Light green resent

## 2020-08-12 NOTE — H&P (Addendum)
History and Physical:    Liberti Appleton   RUE:[id number] DOB: 02/22/1946 DOA: 08/12/2020  Referring MD/provider: Dr. Erma Heritage PCP: Keane Police, MD   Patient coming from: [facility]  Chief Complaint: Recurrent seizure.  History is entirely per ED documentation.  I attempted to call patient Sister Katheren Jimmerson however was unable to reach her.  History of Present Illness:   Henya Aguallo is an 75 y.o. female with PMH significant for autism/intellectual disability who is nonverbal, presently lives in a nursing home and history of seizure disorder was in her usual state of health until earlier today when she was noted to have recurrent seizure today which lasted from 74 20-11 45.  She was treated with Ativan 1 mg IM x2 with resolution of the seizure.  Patient's mental status however has not returned back to normal so she was sent to the ED for further treatment.  ED Course:  The patient was noted to be quite somnolent, more somnolent than baseline per patient's sister who was at bedside.  Laboratory data was unremarkable.  Dilantin level was slightly elevated.  Patient was given 2 g Keppra load IV.  Patient is admitted for observation given prolonged somnolence.  ROS:   ROS   Review of Systems: Patient unable to provide review of systems  Past Medical History:   Past Medical History:  Diagnosis Date  . Mental retardation   . Seizures (HCC)     Past Surgical History:   Past Surgical History:  Procedure Laterality Date  . ESOPHAGOGASTRODUODENOSCOPY (EGD) WITH PROPOFOL N/A 10/10/2019   Procedure: ESOPHAGOGASTRODUODENOSCOPY (EGD) WITH PROPOFOL;  Surgeon: Pasty Spillers, MD;  Location: [facility];  Service: Endoscopy;  Laterality: N/A;  . NO PAST SURGERIES    . TIBIA IM NAIL INSERTION Right 04/09/2016   Procedure: INTRAMEDULLARY (IM) NAIL TIBIAL;  Surgeon: Christena Flake, MD;  Location: [facility] OR[facility];  Service: Orthopedics;  Laterality: Right;    Social History:    Social History   Socioeconomic History  . Marital status: Single    Spouse name: Not on file  . Number of children: Not on file  . Years of education: Not on file  . Highest education level: Not on file  Occupational History  . Not on file  Tobacco Use  . Smoking status: Never Smoker  . Smokeless tobacco: Never Used  Substance and Sexual Activity  . Alcohol use: No  . Drug use: No  . Sexual activity: Not on file  Other Topics Concern  . Not on file  Social History Narrative  . Not on file   Social Determinants of Health   Financial Resource Strain: Not on file  Food Insecurity: Not on file  Transportation Needs: Not on file  Physical Activity: Not on file  Stress: Not on file  Social Connections: Not on file  Intimate Partner Violence: Not on file    Allergies   Patient has no known allergies.  Family history:   Family History  Family history unknown: Yes    Current Medications:   Prior to Admission medications   Medication Sig Start Date End Date Taking? Authorizing Provider  acetaminophen (TYLENOL) 325 MG tablet Take 650 mg by mouth every 6 (six) hours as needed.   Yes [provider]  diphenhydrAMINE (BENADRYL) 25 MG tablet Take 25 mg by mouth at bedtime.   Yes [provider]  ipratropium-albuterol (DUONEB) 0.5-2.5 (3) MG/3ML SOLN Take 3 mLs by nebulization every 4 (four) hours as needed. PRN wheezing,  SOB   Yes [provider]  levETIRAcetam (KEPPRA) 750 MG tablet Take 1,500 mg by mouth 2 (two) times daily.   Yes [provider]  loratadine (CLARITIN) 10 MG tablet Take 10 mg by mouth daily.   Yes [provider]  Multiple Vitamin (MULTIVITAMIN WITH MINERALS) TABS tablet Take 1 tablet by mouth daily.   Yes [provider]  omeprazole (PRILOSEC) 20 MG capsule Take 1 capsule (20 mg total) by mouth daily. 10/07/19 03/18/20 Yes Arrien, York RamMauricio Daniel, MD  PHENobarbital (LUMINAL) 16.2 MG tablet Take 3  tablets (48.6 mg total) by mouth 3 (three) times daily. 08/21/19  Yes Sreenath, Sudheer B, MD  phenytoin (DILANTIN) 100 MG ER capsule Take 400 mg by mouth at bedtime.    Yes [provider]  phenytoin (DILANTIN) 100 MG ER capsule Take 200 mg by mouth daily.   Yes [provider]  SPS 15 GM/60ML suspension Take 60 mLs by mouth every Wednesday. 06/16/19  Yes [provider]    Physical Exam:   Vitals:   08/12/20 1304 08/12/20 1306 08/12/20 1320 08/12/20 1431  BP: 97/70   (!) 103/42  Pulse:    89  Resp: 18   16  Temp:    98.4 F (36.9 C)  TempSrc:    Oral  SpO2:  96%  98%  Weight:   129 kg   Height:   5\' 3"  (1.6 m)      Physical Exam: Blood pressure (!) 103/42, pulse 89, temperature 98.4 F (36.9 C), temperature source Oral, resp. rate 16, height 5\' 3"  (1.6 m), weight 129 kg, SpO2 98 %. Gen: Obese, chronically ill-appearing female lying flat in bed in no respiratory distress.  Eyes: sclera anicteric, conjuctiva mildly injected bilaterally CVS: distant heart sounds  Respiratory:  decreased air entry likely secondary to decreased inspiratory effort GI: NABS, soft, NT  LE: Chronic brawny edema, flaccid lower extremities Neuro: Patient is quite somnolent.  Her GCS is 8. Psych: GCS 8   Data Review:    Labs: Basic Metabolic Panel: Recent Labs  Lab 08/12/20 1455  NA 142  K 3.3*  CL 108  CO2 26  GLUCOSE 126*  BUN 14  CREATININE 0.50  CALCIUM 8.5*   Liver Function Tests: Recent Labs  Lab 08/12/20 1455  AST 21  ALT 19  ALKPHOS 196*  BILITOT 0.3  PROT 7.4  ALBUMIN 2.8*   No results for input(s): LIPASE, AMYLASE in the last 168 hours. No results for input(s): AMMONIA in the last 168 hours. CBC: Recent Labs  Lab 08/12/20 1323  WBC 6.0  NEUTROABS 3.7  HGB 10.8*  HCT 33.4*  MCV 89.5  PLT 307   Cardiac Enzymes: Recent Labs  Lab 08/12/20 1455  CKTOTAL 56    BNP (last 3 results) No results for input(s): PROBNP in the last 8760  hours. CBG: Recent Labs  Lab 08/12/20 1239  GLUCAP 150*    Urinalysis    Component Value Date/Time   COLORURINE YELLOW (A) 08/12/2020 1456   APPEARANCEUR CLOUDY (A) 08/12/2020 1456   LABSPEC 1.015 08/12/2020 1456   PHURINE 5.0 08/12/2020 1456   GLUCOSEU NEGATIVE 08/12/2020 1456   HGBUR SMALL (A) 08/12/2020 1456   BILIRUBINUR NEGATIVE 08/12/2020 1456   KETONESUR NEGATIVE 08/12/2020 1456   PROTEINUR NEGATIVE 08/12/2020 1456   UROBILINOGEN 0.2 05/20/2013 0437   NITRITE NEGATIVE 08/12/2020 1456   LEUKOCYTESUR MODERATE (A) 08/12/2020 1456      Radiographic Studies: CT Head Wo Contrast  Result  Date: 08/12/2020 CLINICAL DATA:  Seizure, nontraumatic. EXAM: CT HEAD WITHOUT CONTRAST TECHNIQUE: Contiguous axial images were obtained from the base of the skull through the vertex without intravenous contrast. COMPARISON:  CT Aug 18, 2019. FINDINGS: Brain: No evidence of acute infarction, hemorrhage, hydrocephalus, extra-axial collection or mass lesion/mass effect. Similar chronic cerebellar atrophy. Vascular: Calcific atherosclerosis. No hyperdense vessel identified. Skull: Diffuse calvarial thickening. Sinuses/Orbits: Mucosal thickening of scattered ethmoid air cells with frothy secretions in the left frontal sinus. Other: Small left mastoid effusion. IMPRESSION: 1. No evidence of acute intracranial abnormality. MRI could provide more sensitive evaluation for an anatomic epileptogenic abnormality, if clinically indicated. 2. Chronic cerebellar atrophy and diffuse calvarial thickening, possibly the sequela of long-term anti seizure medication. 3. Small left mastoid effusion and left frontoethmoidal sinus disease, detailed above. Electronically Signed   By: Feliberto Harts MD   On: 08/12/2020 13:56   CT Soft Tissue Neck W Contrast  Result Date: 08/12/2020 CLINICAL DATA:  Suspect epiglottitis or tonsillitis. Neck and submandibular swelling. Recent seizure. EXAM: CT NECK WITH CONTRAST TECHNIQUE:  Multidetector CT imaging of the neck was performed using the standard protocol following the bolus administration of intravenous contrast. CONTRAST:  49mL OMNIPAQUE IOHEXOL 300 MG/ML  SOLN COMPARISON:  CT head 08/12/2020 FINDINGS: Pharynx and larynx: Normal. No mass or swelling. Salivary glands: No inflammation, mass, or stone. Thyroid: Negative thyroid Lymph nodes: No enlarged lymph nodes in the neck. Vascular: Normal vascular enhancement. Limited intracranial: Negative Visualized orbits: Negative Mastoids and visualized paranasal sinuses: Mild mucosal edema in the ethmoid sinuses and left maxillary sinus. No air-fluid level. Mild left mastoid effusion. Right mastoid clear Skeleton: Cervical degenerative changes and anterior spurring. No acute skeletal abnormality. Upper chest: Lung apices clear bilaterally. Other: Mass lesion in the right anterior neck above the thyroid and appears attached to the strap muscles. The mass measures 2.5 cm is predominately fat in density with a rim of surrounding soft tissue. No edema in the adjacent fat. IMPRESSION: Negative for peritonsillar abscess or airway narrowing. 2.5 cm fat containing mass in the right anterior neck with a benign appearance. Electronically Signed   By: Marlan Palau M.D.   On: 08/12/2020 16:13   DG Chest Portable 1 View  Result Date: 08/12/2020 CLINICAL DATA:  Seizure EXAM: PORTABLE CHEST 1 VIEW COMPARISON:  10/04/2019 FINDINGS: Low lung volumes. Basilar atelectasis. No effusion, infiltrate or pneumothorax. Mild venous congestion fracture. IMPRESSION: Low lung volumes and venous congestion. No fracture or pneumothorax. Electronically Signed   By: Genevive Bi M.D.   On: 08/12/2020 13:50    EKG: Ordered and pending    Assessment/Plan:   Principal Problem:   Seizure disorder (HCC) Active Problems:   Intellectual disability   Class 3 obesity  75 year old female with known seizure disorder and cognitive delay is admitted with prolonged  postictal state after a witnessed seizure earlier today.  Somnolence likely secondary to prolonged postictal state Will monitor and assess patient overnight. Will need to discuss patient's baseline mental status with sister in the morning.  Attempt to contact sister today was unsuccessful. Neurology consult placed for any recommendations for med changes if warranted. I have contacted pharmacy to change Dilantin, Keppra and phenobarbital to IV since patient is presently obtunded and n.p.o. Other causes of prolonged somnolence include medication and infection. Patient had received Ativan 2 g in the field prior to coming here. In terms of infection, patient is afebrile without leukocytosis Dust x-ray is negative and abdominal exam is benign. She does have an  abnormal UA that is not a clean-catch as noted below.  Hypotension H&H are at baseline. Will hydrate and treat with empiric ceftriaxone pending repeat UA  Abnormal UA with multiple squames. Abnormal UA with multiple squames despite attempts at catheterization. Discussed with the nurse who notes that even doing a straight cath on the patient is difficult given weight of thighs Will attempt to repeat UA. In the meanwhile however, will continue empiric treatment with ceftriaxone 1 gram every 12 as started in the ED given low normal blood pressures.  Hypokalemia Will replete and recheck  Neck mass Patient noted to have a benign fat-containing mass in her neck as seen on CT today This was discussed with ENT who noted there is no further work-up necessary   Other information:    DVT prophylaxis: Lovenox ordered. Code Status: DNR Family Communication: None, attempts to contact Elissa Hefty were unsuccessful Disposition Plan: [facility] Consults called: Neurology Admission status: Observation  Tyiesha Brackney Tublu Caryl Fate [facility]  If 7PM-7AM, please contact night-coverage [email or web address]

## 2020-08-12 NOTE — ED Notes (Signed)
Attempted to give report to the floor 

## 2020-08-12 NOTE — ED Notes (Signed)
Message MD regarding blood pressure

## 2020-08-12 NOTE — Consult Note (Signed)
Neurology Consultation Reason for Consult: Breakthrough seizures Requesting Physician: Leandro Reasoner   CC: Seizures  History is obtained from: Chart review given patient's baseline intellectual disability, supplemented by family at bedside, with attempt to reach facility for additional information  HPI: Mariah Jimenez is a 75 y.o. female with a past medical history significant for intellectual disability, seizures.  Per chart review patient has had a history of seizures since age 2, with a recent baseline frequency of about once every 1 to 2 months (March 2021, May 2021, June 2021) at her last outpatient visit on 09/2019.    She was last admitted 08/18/2019 for breakthrough seizures.  At the time she was intubated but self extubated while in the ED.  It was unclear if all prescribed medications have been given to patient at her facility.  EEG was notable for generalized spike and wave discharges as well as generalized spikes predominantly during sleep.  Phenobarbital was increased from 27.3 mg 3 times daily to 48.6 mg 3 times daily (level was 10.5 on admission), Keppra was continued at 1500 mg twice daily and Dilantin at 300 mg nightly.  She was also noted to have a UTI which could have been lowering her seizure threshold.  Notably, October 15, 2019 Free phenytoin was undetectable and total levels were low at 2.4 with elevated Keppra levels at 66.7 -- these levels were checked after her last outpatient visit with neurology at which time there was some concern about proper medication ministration at her facility, and lack of clarity about exactly what she was taking.  Per reviewing palliative care notes, she was still on phenytoin 300 mg nightly in August but then in October 2022 her dose was increased to 200 mg phenytoin in the morning and 400 mg nightly.  Work-up here has revealed questionable urinary tract infection (contaminated sample, difficulty obtaining clean-catch/fully sample due to patient's  body habitus and baseline intellectual disability).  Family notes that at this time she is almost to her normal self (95% towards her baseline).  They are most concerned about diffuse edema, which they do feel has improved over the course of her hospitalization, as well as a neck mass found to be benign fat on CT neck. They also note a more general decline in the last year or so in that her speech is no longer intelligible to them though she still attempts to talk.   Further history was provided by the facility.  They noted her phenytoin levels were 30.9 on 3/21 (unclear if any intervention was made at that time), then 17.3 on 4/12 and 18.5 on 4/20.  They note her phenobarbital level was 38.5 on 3/4 and phenobarbital was therefore held for 2 to 3 days without any change in dose on resuming it.  ROS:  Unable to obtain due to baseline mental status.   Past Medical History:  Diagnosis Date  . Mental retardation   . Seizures (HCC)    Past Surgical History:  Procedure Laterality Date  . ESOPHAGOGASTRODUODENOSCOPY (EGD) WITH PROPOFOL N/A 10/10/2019   Procedure: ESOPHAGOGASTRODUODENOSCOPY (EGD) WITH PROPOFOL;  Surgeon: Pasty Spillers, MD;  Location: [facility];  Service: Endoscopy;  Laterality: N/A;  . NO PAST SURGERIES    . TIBIA IM NAIL INSERTION Right 04/09/2016   Procedure: INTRAMEDULLARY (IM) NAIL TIBIAL;  Surgeon: Christena Flake, MD;  Location: [facility];  Service: Orthopedics;  Laterality: Right;   Current Outpatient Medications  Medication Instructions  . acetaminophen (TYLENOL) 650 mg, Oral, Every 6 hours PRN  .  diphenhydrAMINE (BENADRYL) 25 mg, Oral, Daily at bedtime  . ipratropium-albuterol (DUONEB) 0.5-2.5 (3) MG/3ML SOLN 3 mLs, Nebulization, Every 4 hours PRN, PRN wheezing, SOB   . levETIRAcetam (KEPPRA) 1,500 mg, Oral, 2 times daily  . loratadine (CLARITIN) 10 mg, Oral, Daily  . Multiple Vitamin (MULTIVITAMIN WITH MINERALS) TABS tablet 1 tablet, Oral, Daily  . omeprazole  (PRILOSEC) 20 mg, Oral, Daily  . phenobarbital (LUMINAL) 48.6 mg, Oral, 3 times daily  . phenytoin (DILANTIN) 400 mg, Oral, Daily at bedtime  . phenytoin (DILANTIN) 200 mg, Oral, Daily  . SPS 15 GM/60ML suspension 60 mLs, Oral, Every Wed    Family History  Family history unknown: Yes    Social History:  reports that she has never smoked. She has never used smokeless tobacco. She reports that she does not drink alcohol and does not use drugs.   Exam: Current vital signs: BP (!) 109/50   Pulse 75   Temp 98.4 F (36.9 C) (Oral)   Resp 13   Ht 5\' 3"  (1.6 m)   Wt 129 kg   SpO2 100%   BMI 50.38 kg/m  Vital signs in last 24 hours: Temp:  [97.2 F (36.2 C)-98.4 F (36.9 C)] 98.4 F (36.9 C) (05/03 1431) Pulse Rate:  [73-114] 75 (05/03 1900) Resp:  [13-30] 13 (05/03 1900) BP: (79-114)/(39-70) 109/50 (05/03 1900) SpO2:  [96 %-100 %] 100 % (05/03 1900) Weight:  03-24-2006 kg] 129 kg (05/03 1320)   Physical Exam  Constitutional: Appears well-developed and well-nourished.  Psych: Calm and cooperative in mimicking. Eyes: No scleral injection HENT: No oropharyngeal obstruction.  Lip protrudes and hangs down.  Some gingival hypertrophy MSK: no joint deformities.  Diffuse edema in all 4 extremities Cardiovascular: Normal rate and regular rhythm.  Respiratory: Effort normal, non-labored breathing GI: Soft.  No distension. There is no tenderness.  Skin: Warm dry and intact visible skin  Neuro: Mental Status: Sleepy but awakens with light stimulation.  Unintelligible speech.  Mimics but does not clearly follow commands.  More interested in interacting with family than examiner Cranial Nerves: II: Blinks to threat, pupils are equal, round, and reactive to light 4 to 2 mm  III,IV, VI: EOMI to tracking examiner briefly V: Facial sensation is symmetric to eyelash brush  VII: Facial movement is symmetric.  VIII: hearing is intact to voice X: Uvula elevates symmetrically XI: Unable to  assess secondary to mental status XII: Unable to assess secondary to mental status Motor: Tone is normal. Bulk is low in the lower extremities.  Mild pronation of the bilateral upper extremities without significant drift.  Trace movement of the bilateral lower extremities, bedbound at baseline Sensory: Equally responsive to stimulation in all 4 extremities Deep Tendon Reflexes: 2+ and symmetric in the biceps, unable to elicit at the patellae.  Plantars: Toes are downgoing bilaterally.  Cerebellar: Able to give a high-five bilaterally  I have reviewed labs in epic and the results pertinent to this consultation are: Antiseizure medication levels as detailed above in HPI UA with small hemoglobin pigment, moderate leukocytes, many bacteria, amorphous crystals, greater than 50 white blood cells, negative nitrates, positive squamous cells Respiratory viral panel negative Chest x-ray with low lung volumes, bibasilar atelectasis and mild venous congestion but no clear infectious process UDS notable for positive barbiturates (appropriate given phenobarb) and tricyclic  I have reviewed the images obtained: Head CT personally reviewed, agree with radiology read: 1. No evidence of acute intracranial abnormality. MRI could provide more sensitive evaluation for an  anatomic epileptogenic abnormality, if clinically indicated. 2. Chronic cerebellar atrophy and diffuse calvarial thickening, possibly the sequela of long-term anti seizure medication. 3. Small left mastoid effusion and left frontoethmoidal sinus disease, detailed above.   Impression: Breakthrough seizure with supratherapeutic antiseizure medication levels which may have contributed to seizure activity.  Given half-life of phenobarbital is roughly 8 hours, and her level last night was 42.6 with a similarly elevated level in early March, she needs a dose reduction of this medication when it is resumed.  It should again be held for  approximately 3 days given its half-life.  Given her phenytoin was only minimally supratherapeutic and phenytoin and phenobarbital have complex drug drug interactions, will change only phenobarbital dose at this time and plan to recheck levels next week  Recommendations: -Hold phenobarbital until 5/7, then start 2 tablets of 16.2 three times a day (32.4 mg 3 times daily) starting with the evening dose on 5/7 -Continue phenytoin at 200 mg in the morning, 400 mg nightly -Continue Keppra 1500 mg twice daily -Recheck phenobarbital and phenytoin levels (trough levels) in 1 week with further adjustments as needed; recommend close follow-up with [facility] clinic (Dr. Malvin Johns).  Please note given the complex interactions of these 2 medications, repeat level checked early the week of May 9 is recommended -Recommend completing UTI treatment in case this is contributing to her symptoms with antibiotics per primary team   Brooke Dare MD-PhD [facility] [phone] [facility] coverage for [facility] is from 8 AM to 4 AM in-house and 4 PM to 8 PM by telephone/video. 8 PM to 8 AM emergent questions or overnight urgent questions should be addressed to Teleneurology On-call or [facility] neurohospitalist; contact information can be found on AMION

## 2020-08-12 NOTE — Care Plan (Signed)
Patient presenting with breakthrough seizures, for which full neurology consult will be performed tomorrow.-Given elevated Dilantin level in the past that Dilantin toxicity can lead to seizures, will hold Dilantin overnight and recheck level in the morning.  Full recommendations to follow full evaluation  Brooke Dare MD-PhD [facility] [phone] [facility] coverage for [facility] is from 8 AM to 4 AM in-house and 4 PM to 8 PM by telephone/video. 8 PM to 8 AM emergent questions or overnight urgent questions should be addressed to Teleneurology On-call or [facility] neurohospitalist; contact information can be found on AMION

## 2020-08-12 NOTE — ED Notes (Signed)
Patient transported to CT 

## 2020-08-12 NOTE — ED Notes (Signed)
Lab at bedside

## 2020-08-12 NOTE — ED Notes (Signed)
Assisting primary RN, pt movign arms in bed, intermittently opens eyes not in correlation with stimuli. Samyra, RN messaged, however waiting for [facility] to respond in relation to pt MEWS score and acuity for transfer to floor at this time. Charge RN made aware. MD Cecil Cranker about NS with KCL and K infusion orders, K infusions dc'd. Awaiting AC response at this time. Pt breathing is regular and unlabored, snoring noted O2 sat 100%

## 2020-08-12 NOTE — ED Notes (Signed)
Called lab to draw blood.

## 2020-08-12 NOTE — ED Notes (Signed)
Sister at bedside  marjean imperato  [phone]  Stated pt is autistic

## 2020-08-12 NOTE — ED Provider Notes (Addendum)
[facility] Emergency Department Provider Note  ____________________________________________   Event Date/Time   First MD Initiated Contact with Patient 08/12/20 1242     (approximate)  I have reviewed the triage vital signs and the nursing notes.   HISTORY  Chief Complaint Seizures    HPI Mariah Jimenez is a 75 y.o. female with history of cognitive delay, seizure disorder, here with witnessed seizure-like activity.   Patient reportedly entered a generalized seizure at around 1120 the last until 51.  Facility gave 1 mg IM Ativan x2.  Patient has had resolution of seizure but has been very drowsy since then.  Patient has not responded.  She has cognitive delay but is normally somewhat more responsive.  She is DNR.  History of seizure disorder on 3 antiepileptics.  Unknown if she has had any recent illness.  No reported recent medication changes.  Remainder of history limited.       Past Medical History:  Diagnosis Date  . Mental retardation   . Seizures [facility])     Patient Active Problem List   Diagnosis Date Noted  . 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 Gastric polyp   . Class 3 obesity 10/07/2019  . Sinus tachycardia 10/05/2019  . Pressure injury of skin 10/05/2019  . Dehydration 10/05/2019  . Intractable nausea and vomiting 10/04/2019  . Intellectual disability 10/04/2019  . Elevated lipase 10/04/2019  . Seizure disorder (HCC) 08/18/2019  . UTI (urinary tract infection) 08/18/2019  . Hypothermia 08/18/2019  . Acute respiratory failure with hypoxia (HCC) 08/18/2019  . Palliative care encounter 10/06/2018  . Debility 10/06/2018  . Tibia/fibula fracture 04/09/2016    Past Surgical History:  Procedure Laterality Date  . ESOPHAGOGASTRODUODENOSCOPY (EGD) WITH PROPOFOL N/A 10/10/2019   Procedure: ESOPHAGOGASTRODUODENOSCOPY (EGD) WITH PROPOFOL;  Surgeon: Pasty Spillers, MD;  Location: [facility] ENDOSCOPY;  Service: Endoscopy;  Laterality:  N/A;  . NO PAST SURGERIES    . TIBIA IM NAIL INSERTION Right 04/09/2016   Procedure: INTRAMEDULLARY (IM) NAIL TIBIAL;  Surgeon: Christena Flake, MD;  Location: [facility] ORS;  Service: Orthopedics;  Laterality: Right;    Prior to Admission medications   Medication Sig Start Date End Date Taking? Authorizing Provider  acetaminophen (TYLENOL) 325 MG tablet Take 650 mg by mouth every 6 (six) hours as needed.   Yes [provider]  diphenhydrAMINE (BENADRYL) 25 MG tablet Take 25 mg by mouth at bedtime.   Yes [provider]  ipratropium-albuterol (DUONEB) 0.5-2.5 (3) MG/3ML SOLN Take 3 mLs by nebulization every 4 (four) hours as needed. PRN wheezing, SOB   Yes [provider]  levETIRAcetam (KEPPRA) 750 MG tablet Take 1,500 mg by mouth 2 (two) times daily.   Yes [provider]  loratadine (CLARITIN) 10 MG tablet Take 10 mg by mouth daily.   Yes [provider]  Multiple Vitamin (MULTIVITAMIN WITH MINERALS) TABS tablet Take 1 tablet by mouth daily.   Yes [provider]  omeprazole (PRILOSEC) 20 MG capsule Take 1 capsule (20 mg total) by mouth daily. 10/07/19 03/18/20 Yes Arrien, York Ram, MD  PHENobarbital (LUMINAL) 16.2 MG tablet Take 3 tablets (48.6 mg total) by mouth 3 (three) times daily. 08/21/19  Yes Sreenath, Sudheer B, MD  phenytoin (DILANTIN) 100 MG ER capsule Take 400 mg by mouth at bedtime.    Yes [provider]  phenytoin (DILANTIN) 100 MG ER capsule Take 200 mg by mouth daily.   Yes [provider]  SPS 15 GM/60ML suspension  Take 60 mLs by mouth every Wednesday. 06/16/19  Yes [provider]    Allergies Patient has no known allergies.  Family History  Family history unknown: Yes    Social History Social History   Tobacco Use  . Smoking status: Never Smoker  . Smokeless tobacco: Never Used  Substance Use Topics  . Alcohol use: No  . Drug use: No    Review of Systems  Review of Systems   Unable to perform ROS: Patient unresponsive     ____________________________________________  PHYSICAL EXAM:      VITAL SIGNS: ED Triage Vitals  Enc Vitals Group     BP --      Pulse Rate 08/12/20 1249 (!) 114     Resp 08/12/20 1249 (!) 30     Temp 08/12/20 1249 (!) 97.2 F (36.2 C)     Temp Source 08/12/20 1249 Rectal     SpO2 --      Weight 08/12/20 1250 284 lb 6.3 oz (129 kg)     Height --      Head Circumference --      Peak Flow --      Pain Score --      Pain Loc --      Pain Edu? --      Excl. in GC? --      Physical Exam Vitals and nursing note reviewed.  Constitutional:      General: She is not in acute distress.    Appearance: She is well-developed.  HENT:     Head: Normocephalic and atraumatic.  Eyes:     Conjunctiva/sclera: Conjunctivae normal.  Cardiovascular:     Rate and Rhythm: Normal rate and regular rhythm.     Heart sounds: Normal heart sounds.  Pulmonary:     Effort: Pulmonary effort is normal. No respiratory distress.     Breath sounds: No wheezing.  Abdominal:     General: There is no distension.  Musculoskeletal:     Cervical back: Neck supple.  Skin:    General: Skin is warm.     Capillary Refill: Capillary refill takes less than 2 seconds.     Findings: No rash.  Neurological:     Mental Status: She is lethargic.     Motor: No abnormal muscle tone.     Comments: Winces and slightly withdraws to painful stimuli bilateral upper and lower extremities.  Does not follow commands.  Drowsy.       ____________________________________________   LABS (all labs ordered are listed, but only abnormal results are displayed)  Labs Reviewed  CBC WITH DIFFERENTIAL/PLATELET - Abnormal; Notable for the following components:      Result Value   RBC 3.73 (*)    Hemoglobin 10.8 (*)    HCT 33.4 (*)    All other components within normal limits  PHENYTOIN LEVEL, TOTAL - Abnormal; Notable for the following components:   Phenytoin Lvl 25.4 (*)     All other components within normal limits  URINALYSIS, COMPLETE (UACMP) WITH MICROSCOPIC - Abnormal; Notable for the following components:   Color, Urine YELLOW (*)    APPearance CLOUDY (*)    Hgb urine dipstick SMALL (*)    Leukocytes,Ua MODERATE (*)    WBC, UA >50 (*)    Bacteria, UA MANY (*)    All other components within normal limits  BLOOD GAS, VENOUS - Abnormal; Notable for the following components:   Bicarbonate 30.1 (*)    Acid-Base Excess 3.2 (*)  All other components within normal limits  URINE DRUG SCREEN, QUALITATIVE ([facility] ONLY) - Abnormal; Notable for the following components:   Tricyclic, Ur Screen POSITIVE (*)    Barbiturates, Ur Screen POSITIVE (*)    All other components within normal limits  COMPREHENSIVE METABOLIC PANEL - Abnormal; Notable for the following components:   Potassium 3.3 (*)    Glucose, Bld 126 (*)    Calcium 8.5 (*)    Albumin 2.8 (*)    Alkaline Phosphatase 196 (*)    All other components within normal limits  CBG MONITORING, ED - Abnormal; Notable for the following components:   Glucose-Capillary 150 (*)    All other components within normal limits  URINE CULTURE  CK  PHENOBARBITAL LEVEL  LEVETIRACETAM LEVEL    ____________________________________________  EKG:  ________________________________________  RADIOLOGY All imaging, including plain films, CT scans, and ultrasounds, independently reviewed by me, and interpretations confirmed via formal radiology reads.  ED MD interpretation:   CT head: No acute abnormality left sinus disease Chest x-ray: No acute abnormality  Official radiology report(s): CT Head Wo Contrast  Result Date: 08/12/2020 CLINICAL DATA:  Seizure, nontraumatic. EXAM: CT HEAD WITHOUT CONTRAST TECHNIQUE: Contiguous axial images were obtained from the base of the skull through the vertex without intravenous contrast. COMPARISON:  CT Aug 18, 2019. FINDINGS: Brain: No evidence of acute infarction, hemorrhage,  hydrocephalus, extra-axial collection or mass lesion/mass effect. Similar chronic cerebellar atrophy. Vascular: Calcific atherosclerosis. No hyperdense vessel identified. Skull: Diffuse calvarial thickening. Sinuses/Orbits: Mucosal thickening of scattered ethmoid air cells with frothy secretions in the left frontal sinus. Other: Small left mastoid effusion. IMPRESSION: 1. No evidence of acute intracranial abnormality. MRI could provide more sensitive evaluation for an anatomic epileptogenic abnormality, if clinically indicated. 2. Chronic cerebellar atrophy and diffuse calvarial thickening, possibly the sequela of long-term anti seizure medication. 3. Small left mastoid effusion and left frontoethmoidal sinus disease, detailed above. Electronically Signed   By: Feliberto Harts MD   On: 08/12/2020 13:56   CT Soft Tissue Neck W Contrast  Result Date: 08/12/2020 CLINICAL DATA:  Suspect epiglottitis or tonsillitis. Neck and submandibular swelling. Recent seizure. EXAM: CT NECK WITH CONTRAST TECHNIQUE: Multidetector CT imaging of the neck was performed using the standard protocol following the bolus administration of intravenous contrast. CONTRAST:  40mL OMNIPAQUE IOHEXOL 300 MG/ML  SOLN COMPARISON:  CT head 08/12/2020 FINDINGS: Pharynx and larynx: Normal. No mass or swelling. Salivary glands: No inflammation, mass, or stone. Thyroid: Negative thyroid Lymph nodes: No enlarged lymph nodes in the neck. Vascular: Normal vascular enhancement. Limited intracranial: Negative Visualized orbits: Negative Mastoids and visualized paranasal sinuses: Mild mucosal edema in the ethmoid sinuses and left maxillary sinus. No air-fluid level. Mild left mastoid effusion. Right mastoid clear Skeleton: Cervical degenerative changes and anterior spurring. No acute skeletal abnormality. Upper chest: Lung apices clear bilaterally. Other: Mass lesion in the right anterior neck above the thyroid and appears attached to the strap muscles. The  mass measures 2.5 cm is predominately fat in density with a rim of surrounding soft tissue. No edema in the adjacent fat. IMPRESSION: Negative for peritonsillar abscess or airway narrowing. 2.5 cm fat containing mass in the right anterior neck with a benign appearance. Electronically Signed   By: Marlan Palau M.D.   On: 08/12/2020 16:13   DG Chest Portable 1 View  Result Date: 08/12/2020 CLINICAL DATA:  Seizure EXAM: PORTABLE CHEST 1 VIEW COMPARISON:  10/04/2019 FINDINGS: Low lung volumes. Basilar atelectasis. No effusion, infiltrate or pneumothorax. Mild  venous congestion fracture. IMPRESSION: Low lung volumes and venous congestion. No fracture or pneumothorax. Electronically Signed   By: Genevive Bi M.D.   On: 08/12/2020 13:50    ____________________________________________  PROCEDURES   Procedure(s) performed (including Critical Care):  .Critical Care Performed by: Shaune Pollack, MD Authorized by: Shaune Pollack, MD   Critical care provider statement:    Critical care time (minutes):  35   Critical care time was exclusive of:  Separately billable procedures and treating other patients and teaching time   Critical care was necessary to treat or prevent imminent or life-threatening deterioration of the following conditions:  Cardiac failure, circulatory failure, respiratory failure and CNS failure or compromise   Critical care was time spent personally by me on the following activities:  Development of treatment plan with patient or surrogate, discussions with consultants, evaluation of patient's response to treatment, examination of patient, obtaining history from patient or surrogate, ordering and performing treatments and interventions, ordering and review of laboratory studies, ordering and review of radiographic studies, pulse oximetry, re-evaluation of patient's condition and review of old charts   I assumed direction of critical care for this patient from another provider in  my specialty: no      ____________________________________________  INITIAL IMPRESSION / MDM / ASSESSMENT AND PLAN / ED COURSE  As part of my medical decision making, I reviewed the following data within the electronic MEDICAL RECORD NUMBER Nursing notes reviewed and incorporated, Old chart reviewed, Notes from prior ED visits, and [place] Controlled Substance Database       *Mariah Jimenez was evaluated in Emergency Department on 08/12/2020 for the symptoms described in the history of present illness. She was evaluated in the context of the global COVID-19 pandemic, which necessitated consideration that the patient might be at risk for infection with the SARS-CoV-2 virus that causes COVID-19. Institutional protocols and algorithms that pertain to the evaluation of patients at risk for COVID-19 are in a state of rapid change based on information released by regulatory bodies including the CDC and federal and state organizations. These policies and algorithms were followed during the patient's care in the ED.  Some ED evaluations and interventions may be delayed as a result of limited staffing during the pandemic.*     Medical Decision Making: 75 year old female here with confusion after witnessed seizure.  Suspect prolonged postictal state in the setting of seizure and receiving Ativan.  Patient has autism, chronic seizures with similar presentations in the past.  Lab work reviewed, does show possible UTI although she has squamous cells.  Will plan to repeat this.  Otherwise, lab work is overall reassuring.  CMP unremarkable.  Phenytoin slightly elevated.  Of note, patient family believes that patient's neck has been more swollen over the last several weeks, and she has been having some changes in her voice.  CT neck obtained and shows fat-containing mass in the right anterior neck with likely benign appearance.  Will run this by ENT but suspect this can be managed as an outpatient.  Admit to medicine for  prolonged postictal confusion, possible UTI, enecephalopathy. ____________________________________________  FINAL CLINICAL IMPRESSION(S) / ED DIAGNOSES  Final diagnoses:  Seizure (HCC)  Encephalopathy  Neck mass     MEDICATIONS GIVEN DURING THIS VISIT:  Medications  cefTRIAXone (ROCEPHIN) 1 g in sodium chloride 0.9 % 100 mL IVPB (has no administration in time range)  levETIRAcetam (KEPPRA) 2,000 mg in sodium chloride 0.9 % 250 mL IVPB (0 mg Intravenous Stopped 08/12/20 1454)  iohexol (  OMNIPAQUE) 300 MG/ML solution 75 mL (75 mLs Intravenous Contrast Given 08/12/20 1545)     ED Discharge Orders    None       Note:  This document was prepared using Dragon voice recognition software and may include unintentional dictation errors.   Shaune Pollack, MD 08/12/20 1620    Shaune Pollack, MD 09/11/20 [phone]

## 2020-08-12 NOTE — Progress Notes (Signed)
Attempted to get report from listed ED RN at [phone]. No answer. Physiological scientist.

## 2020-08-12 NOTE — ED Triage Notes (Signed)
[facility] ems from [facility] s/p seizure that per report started at 1120 and lasted until 1145. Facility gave 1 mg ativan IM x 2 last at 1145. Pt with hx seizures.

## 2020-08-12 NOTE — ED Notes (Signed)
IV team at bedside 

## 2020-08-13 DIAGNOSIS — I959 Hypotension, unspecified: Secondary | ICD-10-CM | POA: Diagnosis not present

## 2020-08-13 DIAGNOSIS — Z6841 Body Mass Index (BMI) 40.0 and over, adult: Secondary | ICD-10-CM | POA: Diagnosis not present

## 2020-08-13 DIAGNOSIS — Z20822 Contact with and (suspected) exposure to covid-19: Secondary | ICD-10-CM | POA: Diagnosis not present

## 2020-08-13 DIAGNOSIS — F840 Autistic disorder: Secondary | ICD-10-CM | POA: Diagnosis not present

## 2020-08-13 DIAGNOSIS — Z66 Do not resuscitate: Secondary | ICD-10-CM | POA: Diagnosis not present

## 2020-08-13 DIAGNOSIS — J9811 Atelectasis: Secondary | ICD-10-CM | POA: Diagnosis not present

## 2020-08-13 DIAGNOSIS — R221 Localized swelling, mass and lump, neck: Secondary | ICD-10-CM | POA: Diagnosis not present

## 2020-08-13 DIAGNOSIS — F79 Unspecified intellectual disabilities: Secondary | ICD-10-CM | POA: Diagnosis not present

## 2020-08-13 DIAGNOSIS — R569 Unspecified convulsions: Secondary | ICD-10-CM | POA: Diagnosis present

## 2020-08-13 DIAGNOSIS — N390 Urinary tract infection, site not specified: Secondary | ICD-10-CM | POA: Diagnosis not present

## 2020-08-13 DIAGNOSIS — R400 Somnolence: Secondary | ICD-10-CM | POA: Diagnosis not present

## 2020-08-13 DIAGNOSIS — G9340 Encephalopathy, unspecified: Secondary | ICD-10-CM | POA: Diagnosis not present

## 2020-08-13 DIAGNOSIS — Z79899 Other long term (current) drug therapy: Secondary | ICD-10-CM | POA: Diagnosis not present

## 2020-08-13 DIAGNOSIS — E876 Hypokalemia: Secondary | ICD-10-CM | POA: Diagnosis not present

## 2020-08-13 DIAGNOSIS — G40909 Epilepsy, unspecified, not intractable, without status epilepticus: Secondary | ICD-10-CM | POA: Diagnosis not present

## 2020-08-13 LAB — CBC
HCT: 33.9 % — ABNORMAL LOW (ref 36.0–46.0)
Hemoglobin: 10.7 g/dL — ABNORMAL LOW (ref 12.0–15.0)
MCH: 28.8 pg (ref 26.0–34.0)
MCHC: 31.6 g/dL (ref 30.0–36.0)
MCV: 91.1 fL (ref 80.0–100.0)
Platelets: 284 10*3/uL (ref 150–400)
RBC: 3.72 MIL/uL — ABNORMAL LOW (ref 3.87–5.11)
RDW: 15.4 % (ref 11.5–15.5)
WBC: 6.3 10*3/uL (ref 4.0–10.5)
nRBC: 0 % (ref 0.0–0.2)

## 2020-08-13 LAB — BASIC METABOLIC PANEL
Anion gap: 7 (ref 5–15)
BUN: 10 mg/dL (ref 8–23)
CO2: 27 mmol/L (ref 22–32)
Calcium: 8.5 mg/dL — ABNORMAL LOW (ref 8.9–10.3)
Chloride: 110 mmol/L (ref 98–111)
Creatinine, Ser: 0.46 mg/dL (ref 0.44–1.00)
GFR, Estimated: >60 mL/min (ref >60)
Glucose, Bld: 112 mg/dL — ABNORMAL HIGH (ref 70–99)
Potassium: 4 mmol/L (ref 3.5–5.1)
Sodium: 144 mmol/L (ref 135–145)

## 2020-08-13 MED ORDER — PHENOBARBITAL SODIUM 65 MG/ML IJ SOLN
48.6000 mg | Freq: Three times a day (TID) | INTRAMUSCULAR | Status: DC
  Filled 2020-08-13: qty 0.75

## 2020-08-13 MED ORDER — POTASSIUM CHLORIDE 10 MEQ/100ML IV SOLN
10.0000 meq | INTRAVENOUS | Status: AC
Start: 2020-08-13 — End: 2020-08-13
  Administered 2020-08-13 (×2): 10 meq via INTRAVENOUS
  Filled 2020-08-13 (×2): qty 100

## 2020-08-13 MED ORDER — FUROSEMIDE 10 MG/ML IJ SOLN
20.0000 mg | Freq: Once | INTRAMUSCULAR | Status: AC
  Administered 2020-08-13: 20 mg via INTRAVENOUS
  Filled 2020-08-13: qty 2

## 2020-08-13 NOTE — Evaluation (Signed)
Clinical/Bedside Swallow Evaluation Patient Details  Name: Mariah Jimenez MRN: [id number] Date of Birth: November 24, 1945  Today's Date: 08/13/2020 Time: SLP Start Time (ACUTE ONLY): 1550 SLP Stop Time (ACUTE ONLY): 1620 SLP Time Calculation (min) (ACUTE ONLY): 30 min  Past Medical History:  Past Medical History:  Diagnosis Date  . Mental retardation   . Seizures (HCC)    Past Surgical History:  Past Surgical History:  Procedure Laterality Date  . ESOPHAGOGASTRODUODENOSCOPY (EGD) WITH PROPOFOL N/A 10/10/2019   Procedure: ESOPHAGOGASTRODUODENOSCOPY (EGD) WITH PROPOFOL;  Surgeon: Pasty Spillers, MD;  Location: [facility] ENDOSCOPY;  Service: Endoscopy;  Laterality: N/A;  . NO PAST SURGERIES    . TIBIA IM NAIL INSERTION Right 04/09/2016   Procedure: INTRAMEDULLARY (IM) NAIL TIBIAL;  Surgeon: Christena Flake, MD;  Location: [facility] ORS;  Service: Orthopedics;  Laterality: Right;   HPI:  Pt is a 75 year old female with PMH significant for autism/intellectual disability and seizures who presents from her nursing home with seizure activity. Head CT was negative for any acute intracranial abnormality; chest x-ray revealed low lung volumes.   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 These characteristics are unchanged. Pt was able to consume thin liquids via straw and puree without any overt s/s of aspiration or acute dysphagia. When consuming applesauce, she uses an anterior lingual movement with multiple swallows. Given this and current medical condition along with pt's inability to follow directions or effectively communicate, more advanced solids were not assessed. At this time, recommend dysphagia 1 diet with thin liquids (may use straw) and medicine crushed in puree. ST will follow for further diet advancement. SLP Visit Diagnosis: Dysphagia, oropharyngeal phase (R13.12)    Aspiration Risk  Moderate  aspiration risk    Diet Recommendation Dysphagia 1 (Puree);Thin liquid   Liquid Administration via: Straw Medication Administration: Crushed with puree Supervision: Staff to assist with self feeding;Full supervision/cueing for compensatory strategies Compensations: Minimize environmental distractions;Slow rate;Small sips/bites Postural Changes: Seated upright at 90 degrees    Other  Recommendations Oral Care Recommendations: Oral care BID   Follow up Recommendations  (TBD)      Frequency and Duration min 2x/week  2 weeks       Prognosis Prognosis for Safe Diet Advancement: Fair Barriers to Reach Goals: Cognitive deficits;Severity of deficits      Swallow Study   General Date of Onset: 08/12/20 HPI: Pt is a 75 year old female with PMH significant for autism/intellectual disability and seizures who presents from her nursing home with seizure activity. Head CT was negative for any acute intracranial abnormality; chest x-ray revealed low lung volumes. Type of Study: Bedside Swallow Evaluation Previous Swallow Assessment: multiple, most recent recommendation dysphagia 3 with thin liquids Diet Prior to this Study: NPO Temperature Spikes Noted: No Respiratory Status: Nasal cannula History of Recent Intubation: No Behavior/Cognition: Alert;Cooperative Oral Cavity Assessment: Within Functional Limits Oral Cavity - Dentition: Edentulous Self-Feeding Abilities: Total assist Patient Positioning: Upright in bed Baseline Vocal Quality: Not observed Volitional Cough: Cognitively unable to elicit Volitional Swallow: Unable to elicit    Oral/Motor/Sensory Function Overall Oral Motor/Sensory Function: Other (comment) (see clinical impression statement)   Ice Chips Ice chips: Not tested   Thin Liquid Thin Liquid: Within functional limits Presentation: Straw    Nectar Thick Nectar Thick Liquid: Not tested   Honey Thick Honey Thick Liquid: Not tested   Puree Puree: Within functional  limits Presentation: Spoon  Solid     Solid: Not tested     Mariah Jimenez B. Dreama Saa M.S., CCC-SLP, [facility] Speech-Language Pathologist Rehabilitation Services Office [phone]  Rucha Wissinger Dreama Saa 08/13/2020,4:29 PM

## 2020-08-13 NOTE — Progress Notes (Signed)
Initial Nutrition Assessment  DOCUMENTATION CODES:  Morbid obesity  INTERVENTION:   Advance diet as mental status allows per SLP  Will add nutrition supplements pending diet advancement and intake trends  NUTRITION DIAGNOSIS:  Inadequate oral intake related to inability to eat as evidenced by NPO status.  GOAL:  Patient will meet greater than or equal to 90% of their needs  MONITOR:  PO intake,Diet advancement  REASON FOR ASSESSMENT:  Malnutrition Screening Tool    ASSESSMENT:  Pt brought to ED with recurrent seizures at her facility. PMH significant for autism/intellectual disability who is nonverbal at baseline and seizure disorder.  Pt sleeping soundly at the time of visit, did not wake to name being called. No family present at bedside to provide a hx, pt nonverbal at baseline. Pt currently NPO due to decreased alertness. Will monitor for diet advancement and recommend nutrition supplements as appropriate. If pt remains NPO for a prolonged period of time, consider NGT placement for EN feeds.   Relevant Continuous Infusions: . 0.9 % NaCl with KCl 20 mEq / L 50 mL/hr at 08/13/20 1340  . potassium chloride 10 mEq (08/13/20 1357)   Relevant PRN Meds: bisacodyl, sodium phosphate  NUTRITION - FOCUSED PHYSICAL EXAM: Defer at this time - pt not responsive to verbal stimuli  Diet Order:   Diet Order            Diet NPO time specified  Diet effective now                EDUCATION NEEDS:  Not appropriate for education at this time  Skin:  Skin Assessment: Reviewed RN Assessment  Last BM:  unknown  Height:  Ht Readings from Last 1 Encounters:  08/12/20 5\' 3"  (1.6 m)   Weight:  Wt Readings from Last 1 Encounters:  08/12/20 129 kg   Ideal Body Weight:  52.3 kg  BMI:  Body mass index is 50.38 kg/m.  Estimated Nutritional Needs:   Kcal:  1800-2000 kcal  Protein:  90-100 g  Fluid:  >1800 mL/d  10/12/20, RD, LDN Clinical Dietitian Pager on Amion

## 2020-08-13 NOTE — Progress Notes (Signed)
PROGRESS NOTE    Mariah Jimenez  JIR:[id number] DOB: 1946/02/19 DOA: 08/12/2020 PCP: Keane Police, MD   Chief Complaint  Patient presents with  . Seizures   Brief Narrative: 49 old female with history of autism/intellectual disability, nonverbal living in nursing home with history of seizure disorder noted to have recurrent seizure at the facility.  She was treated with Ativan 1 mg IM x2 with resolution of the seizure however patient mental status did not return back to normal so was sent to the ED for further management. In the ED patient was quite somnolent more than baseline per sister lab data unremarkable Dilantin level slightly elevated given 2 g Keppra load admitted and neurology consulted.  Subjective: Trying to mumble words, sister and niece at bedside on 2l [place]. Moving UE wll, legs slightly  swollen   Assessment & Plan:  Breakthrough seizures: Discussed with neurology.  Phenobarb level high 42.6 on admission. Currently patient is on Keppra, phenobarbital (starting 5/5) and Dilantin q8hr. Continue plan as per neurology. Currently n.p.o. speech eval requested if okay with neurology  UTI continue ceftriaxone follow-up urine culture  Intellectual disability/autism/cognitive impairment continue supportive measures.  Morbid obesity with BMI 50 will definitely benefit with weight loss.   Hypotension on admission up to 79/39 - has resolved since.  Leg edema: Leg still swollen but appears better from before.Likely chronic.Stop IV fluid once able to tale po otherwise continue gentle rate.  Try Lasix if blood pressure allows x1 Filed Weights   08/12/20 1250 08/12/20 1320  Weight: 129 kg 129 kg   Hypokalemia repleted  Neck mass noted to be benign fat-containing mass in the CT, this was discussed with ENT on admission and noted there is no further work-up necessary.  Diet Order            Diet NPO time specified  Diet effective now               Patient's Body  mass index is 50.38 kg/m. Pressure Injury 10/04/19 Sacrum Right Stage 2 -  Partial thickness loss of dermis presenting as a shallow open injury with a red, pink wound bed without slough. (Active)  10/04/19 2200  Location: Sacrum  Location Orientation: Right  Staging: Stage 2 -  Partial thickness loss of dermis presenting as a shallow open injury with a red, pink wound bed without slough.  Wound Description (Comments):   Present on Admission: Yes    DVT prophylaxis: Lovenox Code Status:   Code Status: DNR  Family Communication: plan of care discussed with patient's sister and niece at bedside.  Status is: Observation Remains hospitalized for ongoing monitoring of blood level and introduction of AED and for IV antibiotic for UTI.   Dispo: The patient is from: SNF              Anticipated d/c is to: SNF              Patient currently is not medically stable to d/c.   Difficult to place patient No Unresulted Labs (From admission, onward)          Start     Ordered   08/19/20 0500  Creatinine, serum  (enoxaparin (LOVENOX)    CrCl >/= 30 ml/min)  Weekly,   STAT     Comments: while on enoxaparin therapy    08/12/20 1731   08/13/20 0500  Phenytoin level, free and total  Tomorrow morning,   R        08/12/20 2105  08/12/20 1548  Urine culture  ONCE - STAT,   STAT        08/12/20 1547   08/12/20 1530  Levetiracetam level  Once,   R        08/12/20 1530          Medications reviewed: Scheduled Meds: . enoxaparin (LOVENOX) injection  0.5 mg/kg Subcutaneous Q24H  . [START ON 08/14/2020] PHENObarbital  48.6 mg Intravenous TID   Continuous Infusions: . 0.9 % NaCl with KCl 20 mEq / L 100 mL/hr at 08/13/20 0413  . cefTRIAXone (ROCEPHIN)  IV    . levETIRAcetam 1,500 mg (08/13/20 0419)  . phenytoin (DILANTIN) IV 200 mg (08/13/20 1014)    Consultants:see note  Procedures:see note  Antimicrobials: Anti-infectives (From admission, onward)   Start     Dose/Rate Route Frequency Ordered  Stop   08/13/20 1600  cefTRIAXone (ROCEPHIN) 1 g in sodium chloride 0.9 % 100 mL IVPB        1 g 200 mL/hr over 30 Minutes Intravenous Every 24 hours 08/12/20 1746     08/12/20 1600  cefTRIAXone (ROCEPHIN) 1 g in sodium chloride 0.9 % 100 mL IVPB        1 g 200 mL/hr over 30 Minutes Intravenous  Once 08/12/20 1547 08/12/20 1737     Culture/Microbiology    Component Value Date/Time   SDES  10/04/2019 1244    URINE, RANDOM Performed at [facility], [address] [place], [place] [address]    [facility]  10/04/2019 1244    NONE Performed at [facility], [address] [place], [place] [address]    CULT (A) 10/04/2019 1244    >=100,000 COLONIES/mL MULTIPLE SPECIES PRESENT, SUGGEST RECOLLECTION   REPTSTATUS 10/06/2019 FINAL 10/04/2019 1244    Other culture-see note  Objective: Vitals: Today's Vitals   08/12/20 2109 08/12/20 2340 08/13/20 0322 08/13/20 0755  BP: 126/82 (!) 107/57 101/83 113/85  Pulse: 74 68 74 74  Resp: 20 18 20 18   Temp: 97.9 F (36.6 C)  (!) 97.5 F (36.4 C) 98 F (36.7 C)  TempSrc:   Oral Oral  SpO2: 100% 100% 100% 100%  Weight:      Height:        Intake/Output Summary (Last 24 hours) at 08/13/2020 1124 Last data filed at 08/13/2020 0605 Gross per 24 hour  Intake --  Output 450 ml  Net -450 ml   Filed Weights   08/12/20 1250 08/12/20 1320  Weight: 129 kg 129 kg   Weight change:   Intake/Output from previous day: 05/03 0701 - 05/04 0700 In: -  Out: 450 [Urine:450] Intake/Output this shift: No intake/output data recorded. Filed Weights   08/12/20 1250 08/12/20 1320  Weight: 129 kg 129 kg    Examination: General exam: Alert awake nonverbal follows minimal commands, obess HEENT:Oral mucosa moist, Ear/Nose WNL grossly,dentition normal. Respiratory system: bilaterally diminished,no use of accessory muscle, non tender. Cardiovascular system: S1 & S2 +,No JVD. Gastrointestinal system: Abdomen soft, NT,ND,  BS+. Nervous System:Alert, awake, moving extremities Extremities: ankle edema, distal peripheral pulses palpable.  Skin: No rashes,no icterus. MSK: Normal muscle bulk,tone, power  Data Reviewed: I have personally reviewed following labs and imaging studies CBC: Recent Labs  Lab 08/12/20 1323 08/12/20 1923 08/13/20 0439  WBC 6.0 7.2 6.3  NEUTROABS 3.7  --   --   HGB 10.8* 10.2* 10.7*  HCT 33.4* 32.3* 33.9*  MCV 89.5 90.2 91.1  PLT 307 288 284   Basic Metabolic  Panel: Recent Labs  Lab 08/12/20 1455 08/13/20 0439  NA 142 144  K 3.3* 4.0  CL 108 110  CO2 26 27  GLUCOSE 126* 112*  BUN 14 10  CREATININE 0.50 0.46  CALCIUM 8.5* 8.5*   GFR: Estimated Creatinine Clearance: 82.1 mL/min (by C-G formula based on SCr of 0.46 mg/dL). Liver Function Tests: Recent Labs  Lab 08/12/20 1455  AST 21  ALT 19  ALKPHOS 196*  BILITOT 0.3  PROT 7.4  ALBUMIN 2.8*   No results for input(s): LIPASE, AMYLASE in the last 168 hours. No results for input(s): AMMONIA in the last 168 hours. Coagulation Profile: No results for input(s): INR, PROTIME in the last 168 hours. Cardiac Enzymes: Recent Labs  Lab 08/12/20 1455  CKTOTAL 56   BNP (last 3 results) No results for input(s): PROBNP in the last 8760 hours. HbA1C: No results for input(s): HGBA1C in the last 72 hours. CBG: Recent Labs  Lab 08/12/20 1239  GLUCAP 150*   Lipid Profile: No results for input(s): CHOL, HDL, LDLCALC, TRIG, CHOLHDL, LDLDIRECT in the last 72 hours. Thyroid Function Tests: No results for input(s): TSH, T4TOTAL, FREET4, T3FREE, THYROIDAB in the last 72 hours. Anemia Panel: No results for input(s): VITAMINB12, FOLATE, FERRITIN, TIBC, IRON, RETICCTPCT in the last 72 hours. Sepsis Labs: No results for input(s): PROCALCITON, LATICACIDVEN in the last 168 hours.  Recent Results (from the past 240 hour(s))  Resp Panel by RT-PCR (Flu A&B, Covid) Nasopharyngeal Swab     Status: None   Collection Time: 08/12/20   4:58 PM   Specimen: Nasopharyngeal Swab; Nasopharyngeal(NP) swabs in vial transport medium  Result Value Ref Range Status   SARS Coronavirus 2 by RT PCR NEGATIVE NEGATIVE Final    Comment: (NOTE) SARS-CoV-2 target nucleic acids are NOT DETECTED.  The SARS-CoV-2 RNA is generally detectable in upper respiratory specimens during the acute phase of infection. The lowest concentration of SARS-CoV-2 viral copies this assay can detect is 138 copies/mL. A negative result does not preclude SARS-Cov-2 infection and should not be used as the sole basis for treatment or other patient management decisions. A negative result may occur with  improper specimen collection/handling, submission of specimen other than nasopharyngeal swab, presence of viral mutation(s) within the areas targeted by this assay, and inadequate number of viral copies(<138 copies/mL). A negative result must be combined with clinical observations, patient history, and epidemiological information. The expected result is Negative.  Fact Sheet for Patients:  BloggerCourse.com  Fact Sheet for Healthcare Providers:  SeriousBroker.it  This test is no t yet approved or cleared by the [place] FDA and  has been authorized for detection and/or diagnosis of SARS-CoV-2 by FDA under an Emergency Use Authorization (EUA). This EUA will remain  in effect (meaning this test can be used) for the duration of the COVID-19 declaration under Section 564(b)(1) of the Act, 21 U.S.C.section 360bbb-3(b)(1), unless the authorization is terminated  or revoked sooner.       Influenza A by PCR NEGATIVE NEGATIVE Final   Influenza B by PCR NEGATIVE NEGATIVE Final    Comment: (NOTE) The Xpert Xpress SARS-CoV-2/FLU/RSV plus assay is intended as an aid in the diagnosis of influenza from Nasopharyngeal swab specimens and should not be used as a sole basis for treatment. Nasal washings and aspirates  are unacceptable for Xpert Xpress SARS-CoV-2/FLU/RSV testing.  Fact Sheet for Patients: BloggerCourse.com  Fact Sheet for Healthcare Providers: SeriousBroker.it  This test is not yet approved or cleared by the [place]  FDA and has been authorized for detection and/or diagnosis of SARS-CoV-2 by FDA under an Emergency Use Authorization (EUA). This EUA will remain in effect (meaning this test can be used) for the duration of the COVID-19 declaration under Section 564(b)(1) of the Act, 21 U.S.C. section 360bbb-3(b)(1), unless the authorization is terminated or revoked.  Performed at [facility], [address] [place], [place] [id number]      Radiology Studies: CT Head Wo Contrast  Result Date: 08/12/2020 CLINICAL DATA:  Seizure, nontraumatic. EXAM: CT HEAD WITHOUT CONTRAST TECHNIQUE: Contiguous axial images were obtained from the base of the skull through the vertex without intravenous contrast. COMPARISON:  CT Aug 18, 2019. FINDINGS: Brain: No evidence of acute infarction, hemorrhage, hydrocephalus, extra-axial collection or mass lesion/mass effect. Similar chronic cerebellar atrophy. Vascular: Calcific atherosclerosis. No hyperdense vessel identified. Skull: Diffuse calvarial thickening. Sinuses/Orbits: Mucosal thickening of scattered ethmoid air cells with frothy secretions in the left frontal sinus. Other: Small left mastoid effusion. IMPRESSION: 1. No evidence of acute intracranial abnormality. MRI could provide more sensitive evaluation for an anatomic epileptogenic abnormality, if clinically indicated. 2. Chronic cerebellar atrophy and diffuse calvarial thickening, possibly the sequela of long-term anti seizure medication. 3. Small left mastoid effusion and left frontoethmoidal sinus disease, detailed above. Electronically Signed   By: Feliberto HartsFrederick S Jones MD   On: 08/12/2020 13:56   CT Soft Tissue Neck W Contrast  Result  Date: 08/12/2020 CLINICAL DATA:  Suspect epiglottitis or tonsillitis. Neck and submandibular swelling. Recent seizure. EXAM: CT NECK WITH CONTRAST TECHNIQUE: Multidetector CT imaging of the neck was performed using the standard protocol following the bolus administration of intravenous contrast. CONTRAST:  75mL OMNIPAQUE IOHEXOL 300 MG/ML  SOLN COMPARISON:  CT head 08/12/2020 FINDINGS: Pharynx and larynx: Normal. No mass or swelling. Salivary glands: No inflammation, mass, or stone. Thyroid: Negative thyroid Lymph nodes: No enlarged lymph nodes in the neck. Vascular: Normal vascular enhancement. Limited intracranial: Negative Visualized orbits: Negative Mastoids and visualized paranasal sinuses: Mild mucosal edema in the ethmoid sinuses and left maxillary sinus. No air-fluid level. Mild left mastoid effusion. Right mastoid clear Skeleton: Cervical degenerative changes and anterior spurring. No acute skeletal abnormality. Upper chest: Lung apices clear bilaterally. Other: Mass lesion in the right anterior neck above the thyroid and appears attached to the strap muscles. The mass measures 2.5 cm is predominately fat in density with a rim of surrounding soft tissue. No edema in the adjacent fat. IMPRESSION: Negative for peritonsillar abscess or airway narrowing. 2.5 cm fat containing mass in the right anterior neck with a benign appearance. Electronically Signed   By: Marlan Palauharles  Clark M.D.   On: 08/12/2020 16:13   DG Chest Portable 1 View  Result Date: 08/12/2020 CLINICAL DATA:  Seizure EXAM: PORTABLE CHEST 1 VIEW COMPARISON:  10/04/2019 FINDINGS: Low lung volumes. Basilar atelectasis. No effusion, infiltrate or pneumothorax. Mild venous congestion fracture. IMPRESSION: Low lung volumes and venous congestion. No fracture or pneumothorax. Electronically Signed   By: Genevive BiStewart  Edmunds M.D.   On: 08/12/2020 13:50     LOS: 0 days   Lanae Boastamesh Vannia Pola, MD [facility]  08/13/2020, 11:24 AM

## 2020-08-13 NOTE — Progress Notes (Signed)
[facility] [address] [employer] [facility]) Hospital Liaison note:  This patient is currently enrolled in [facility] outpatient-based Palliative Care. Will continue to follow for disposition.  Please call with any outpatient palliative questions or concerns.  Thank you, Abran Cantor, LPN [facility] Liaison [phone]

## 2020-08-14 DIAGNOSIS — R4182 Altered mental status, unspecified: Secondary | ICD-10-CM | POA: Diagnosis not present

## 2020-08-14 DIAGNOSIS — Z7401 Bed confinement status: Secondary | ICD-10-CM | POA: Diagnosis not present

## 2020-08-14 DIAGNOSIS — M2550 Pain in unspecified joint: Secondary | ICD-10-CM | POA: Diagnosis not present

## 2020-08-14 DIAGNOSIS — R404 Transient alteration of awareness: Secondary | ICD-10-CM | POA: Diagnosis not present

## 2020-08-14 DIAGNOSIS — G40909 Epilepsy, unspecified, not intractable, without status epilepticus: Secondary | ICD-10-CM | POA: Diagnosis not present

## 2020-08-14 DIAGNOSIS — R569 Unspecified convulsions: Secondary | ICD-10-CM | POA: Diagnosis not present

## 2020-08-14 LAB — URINE CULTURE

## 2020-08-14 LAB — BASIC METABOLIC PANEL
Anion gap: 6 (ref 5–15)
BUN: 12 mg/dL (ref 8–23)
CO2: 28 mmol/L (ref 22–32)
Calcium: 8.5 mg/dL — ABNORMAL LOW (ref 8.9–10.3)
Chloride: 111 mmol/L (ref 98–111)
Creatinine, Ser: 0.43 mg/dL — ABNORMAL LOW (ref 0.44–1.00)
GFR, Estimated: >60 mL/min (ref >60)
Glucose, Bld: 75 mg/dL (ref 70–99)
Potassium: 4.4 mmol/L (ref 3.5–5.1)
Sodium: 145 mmol/L (ref 135–145)

## 2020-08-14 LAB — PHENYTOIN LEVEL, FREE AND TOTAL
Phenytoin, Free: 3 ug/mL — ABNORMAL HIGH (ref 1.0–2.0)
Phenytoin, Total: 23.6 ug/mL — ABNORMAL HIGH (ref 10.0–20.0)

## 2020-08-14 LAB — LEVETIRACETAM LEVEL: Levetiracetam Lvl: 90.9 ug/mL — ABNORMAL HIGH (ref 10.0–40.0)

## 2020-08-14 MED ORDER — CEPHALEXIN 500 MG PO CAPS: 500.0000 mg | ORAL_CAPSULE | Freq: Four times a day (QID) | ORAL | 0 refills | Status: AC

## 2020-08-14 MED ORDER — PHENOBARBITAL 16.2 MG PO TABS: 32.4000 mg | ORAL_TABLET | Freq: Three times a day (TID) | ORAL | Status: DC

## 2020-08-14 NOTE — TOC Initial Note (Signed)
Transition of Care [facility]) - Initial/Assessment Note    Patient Details  Name: Mariah Jimenez MRN: [id number] Date of Birth: Jan 05, 1946  Transition of Care [facility]) CM/SW Contact:    Allayne Butcher, RN Phone Number: 08/14/2020, 11:37 AM  Clinical Narrative:                 Patient admitted for seizure, has a history of seizures.  Patient is from [facility] long term [facility] has been there since July of 2021.  Patient has been medically cleared for discharge today.  Patient will return to [facility] today.  Patient is going to room C1 bed A.  Bedside RN has called report- EMS transport has been arranged.  Patient is second on the list for pick up.  Family updated on discharge by MD.  Expected Discharge Plan: Skilled Nursing Facility Barriers to Discharge: Barriers Resolved   Patient Goals and CMS Choice Patient states their goals for this hospitalization and ongoing recovery are:: Will return to [facility]      Expected Discharge Plan and Services Expected Discharge Plan: Skilled Nursing Facility   Discharge Planning Services: CM Consult   Living arrangements for the past 2 months: Skilled Nursing Facility Expected Discharge Date: 08/14/20               DME Arranged: N/A DME Agency: NA       HH Arranged: NA HH Agency: NA        Prior Living Arrangements/Services Living arrangements for the past 2 months: Skilled Nursing Facility Lives with:: Facility Resident Patient language and need for interpreter reviewed:: Yes Do you feel safe going back to the place where you live?: Yes      Need for Family Participation in Patient Care: Yes (Comment) Care giver support system in place?: Yes (comment) (family)   Criminal Activity/Legal Involvement Pertinent to Current Situation/Hospitalization: No - Comment as needed  Activities of Daily Living   ADL Screening (condition at time of admission) Is the patient deaf or have difficulty hearing?: No Does the patient have difficulty seeing,  even when wearing glasses/contacts?: No Does the patient have difficulty concentrating, remembering, or making decisions?: Yes Does the patient have difficulty dressing or bathing?: Yes Does the patient have difficulty walking or climbing stairs?: Yes  Permission Sought/Granted Permission sought to share information with : Case Manager,Facility Contact Representative,Family Supports Permission granted to share information with : Yes, Verbal Permission Granted  Share Information with NAME: Mariah Jimenez  Permission granted to share info w AGENCY: [facility]  Permission granted to share info w Relationship: Sister     Emotional Assessment Appearance:: Appears stated age Attitude/Demeanor/Rapport: Lethargic   Orientation: : Oriented to Self Alcohol / Substance Use: Not Applicable Psych Involvement: No (comment)  Admission diagnosis:  Seizure (HCC) [R56.9] Neck mass [R22.1] Encephalopathy [G93.40] Seizure disorder (HCC) [G40.909] Patient Active Problem List   Diagnosis Date Noted  . 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 Gastric polyp   . Class 3 obesity 10/07/2019  . Sinus tachycardia 10/05/2019  . Pressure injury of skin 10/05/2019  . Dehydration 10/05/2019  . Intractable nausea and vomiting 10/04/2019  . Intellectual disability 10/04/2019  . Elevated lipase 10/04/2019  . Seizure disorder (HCC) 08/18/2019  . UTI (urinary tract infection) 08/18/2019  . Hypothermia 08/18/2019  . Acute respiratory failure with hypoxia (HCC) 08/18/2019  . Palliative care encounter 10/06/2018  . Debility 10/06/2018  . Tibia/fibula fracture 04/09/2016   PCP:  Keane Police, MD Pharmacy:   [facility] -  [place], [place] - [address] [place] [address] Phone: [phone] Fax: [phone]     Social Determinants of Health (SDOH) Interventions    Readmission Risk Interventions No flowsheet data found.

## 2020-08-14 NOTE — [place] FL2 (Signed)
[place] MEDICAID FL2 LEVEL OF CARE SCREENING TOOL     IDENTIFICATION  Patient Name: Mariah Jimenez Birthdate: 1945/07/16 Sex: female Admission Date (Current Location): 08/12/2020  [place] and [place] Number:  Chiropodist and Address:  [facility], [address] [place], [place] [address]      Provider Number: [id number]  Attending Physician Name and Address:  Lanae Boast, MD  Relative Name and Phone Number:  Ionna Avis (sister) [phone]    Current Level of Care: Hospital Recommended Level of Care: Skilled Nursing Facility Prior Approval Number:    Date Approved/Denied:   PASRR Number:    Discharge Plan: SNF    Current Diagnoses: Patient Active Problem List   Diagnosis Date Noted  . 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 Gastric polyp   . Class 3 obesity 10/07/2019  . Sinus tachycardia 10/05/2019  . Pressure injury of skin 10/05/2019  . Dehydration 10/05/2019  . Intractable nausea and vomiting 10/04/2019  . Intellectual disability 10/04/2019  . Elevated lipase 10/04/2019  . Seizure disorder (HCC) 08/18/2019  . UTI (urinary tract infection) 08/18/2019  . Hypothermia 08/18/2019  . Acute respiratory failure with hypoxia (HCC) 08/18/2019  . Palliative care encounter 10/06/2018  . Debility 10/06/2018  . Tibia/fibula fracture 04/09/2016    Orientation RESPIRATION BLADDER Height & Weight     Self  O2 ([place]ryan 2L) Incontinent Weight: 129 kg Height:  5\' 3"  (160 cm)  BEHAVIORAL SYMPTOMS/MOOD NEUROLOGICAL BOWEL NUTRITION STATUS    Convulsions/Seizures Incontinent Diet (Dysphagia 1 thin liquids)  AMBULATORY STATUS COMMUNICATION OF NEEDS Skin   Total Care Non-Verbally Normal                       Personal Care Assistance Level of Assistance  Total care       Total Care Assistance: Maximum assistance   Functional Limitations Info  Sight,Hearing Sight Info: Impaired Hearing Info: Impaired      SPECIAL CARE FACTORS  FREQUENCY                       Contractures Contractures Info: Not present    Additional Factors Info  Code Status,Allergies Code Status Info: DNR Allergies Info: NKA           Current Medications (08/14/2020):  This is the current hospital active medication list Current Facility-Administered Medications  Medication Dose Route Frequency Provider Last Rate Last Admin  . 0.9 % NaCl with KCl 20 mEq/ L  infusion   Intravenous Continuous 10/14/2020, MD 50 mL/hr at 08/14/20 0733 Infusion Verify at 08/14/20 0733  . acetaminophen (TYLENOL) tablet 650 mg  650 mg Oral Q6H PRN 10/14/20, MD       Or  . acetaminophen (TYLENOL) suppository 650 mg  650 mg Rectal Q6H PRN Pieter Partridge, MD      . bisacodyl (DULCOLAX) suppository 10 mg  10 mg Rectal Daily PRN Pieter Partridge Tublu, MD      . cefTRIAXone (ROCEPHIN) 1 g in sodium chloride 0.9 % 100 mL IVPB  1 g Intravenous Q24H Leandro Reasoner, MD   Stopped at 08/13/20 1811  . enoxaparin (LOVENOX) injection 65 mg  0.5 mg/kg Subcutaneous Q24H 10/13/20 Tublu, MD   65 mg at 08/13/20 2126  . ipratropium-albuterol (DUONEB) 0.5-2.5 (3) MG/3ML nebulizer solution 3 mL  3 mL Nebulization Q4H PRN 2127 Tublu, MD      . levETIRAcetam (KEPPRA) IVPB 1500 mg/ 100  mL premix  1,500 mg Intravenous Q12H Pieter Partridge, MD   Stopped at 08/14/20 0530  . phenytoin (DILANTIN) 200 mg in sodium chloride 0.9 % 100 mL IVPB  200 mg Intravenous TID Brooke Dare L, MD 208 mL/hr at 08/14/20 0733 Infusion Verify at 08/14/20 0733  . sodium phosphate (FLEET) 7-19 GM/118ML enema 1 enema  1 enema Rectal Once PRN Luberta Robertson Raynaldo Opitz, MD         Discharge Medications: Please see discharge summary for a list of discharge medications.  Relevant Imaging Results:  Relevant Lab Results:   Additional Information SSN: [id number]  Allayne Butcher, RN

## 2020-08-14 NOTE — Discharge Summary (Signed)
Physician Discharge Summary  Mariah Jimenez TOI:[id number] DOB: Jun 22, 1945 DOA: 08/12/2020  PCP: Keane Police, MD  Admit date: 08/12/2020 Discharge date: 08/14/2020  Admitted From: SNF Disposition:  SNF  Recommendations for Outpatient Follow-up:  1. Follow up with PCP in 1  weeks 2. Recheck phenobarbital and phenytoin levels (trough levels) in 1 week with further adjustments as needed; recommend close follow-up with [facility] clinic (Dr. Malvin Johns).  Please note given the complex interactions of these 2 medications, repeat level checked early the week of May 9 is recommended  2. Please obtain BMP/CBC in one week 3. Please follow up on the following pending results:  Home Health:NO  Equipment/Devices: NONE  Discharge Condition: Stable Code Status:   Code Status: DNR Diet recommendation:  Diet Order            DIET - DYS 1 Room service appropriate? Yes; Fluid consistency: Thin  Diet effective now                  Brief/Interim Summary: 51 old female with history of autism/intellectual disability, nonverbal living in nursing home with history of seizure disorder noted to have recurrent seizure at the facility.  She was treated with Ativan 1 mg IM x2 with resolution of the seizure however patient mental status did not return back to normal so was sent to the ED for further management. In the ED patient was quite somnolent more than baseline per sister lab data unremarkable Dilantin level slightly elevated given 2 g Keppra load admitted and neurology consulted. Patient at this time is returned back to baseline.  Patient's AED were adjusted by neurology see below and will need close follow-up to monitor lab. Seen by speech therapist and placed on dysphagia 1 diet thin consistency.   Discharge Diagnoses:   Breakthrough seizures: Discussed with neurology.  Phenobarb level high 42.6 on admission.  Dilantin high at 25.4.  on 5/3.  Per neurology follow recommendation has been lately  seen by speech and diet changed to dysphagia 1 diet.  Mental status has returned to baseline.  Neurology recommendation for discharge Hold phenobarbital until 5/7, then start 2 tablets of 16.2 three times a day (32.4 mg 3 times daily) starting with the evening dose on 5/7 -Continue phenytoin at 200 mg in the morning, 400 mg nightly -Continue Keppra 1500 mg twice daily -Recheck phenobarbital and phenytoin levels (trough levels) in 1 week with further adjustments as needed; recommend close follow-up with [name] clinic (Dr. Malvin Johns).  Please note given the complex interactions of these 2 medications, repeat level checked early the week of May 9 is recommended  UTI  complete 3 days of antibiotics.  Multiple species present on culture   Intellectual disability/autism/cognitive impairment continue supportive measures.  Morbid obesity with BMI 50 will definitely benefit with weight loss.   Hypotension on admission up to 79/39 - has resolved since.  Leg edema:  Keep on Lasix 20 mg daily times week and follow-up labs as outpatient. Fu w/ pcp Hypokalemia repleted  Neck mass noted to be benign fat-containing mass in the CT, this was discussed with ENT on admission and noted there is no further work-up necessary.  Advised to follow-up outpatient with PCP  Pressure Ulcer: Pressure Injury 10/04/19 Sacrum Right Stage 2 -  Partial thickness loss of dermis presenting as a shallow open injury with a red, pink wound bed without slough. (Active)  10/04/19 2200  Location: Sacrum  Location Orientation: Right  Staging: Stage 2 -  Partial thickness loss of  dermis presenting as a shallow open injury with a red, pink wound bed without slough.  Wound Description (Comments):   Present on Admission: Yes   Consults:  Neurology  Subjective: Patient more alert awake ate well this morning with dysphagia 1 diet.  Sister at the bedside. At baseline mental status.  Discharge Exam: Vitals:   08/14/20 0516  08/14/20 0848  BP: 125/64 (!) 120/59  Pulse: 78 73  Resp: 19 17  Temp: (!) 97.4 F (36.3 C) (!) 97.1 F (36.2 C)  SpO2: 100% 100%   General: Pt is alert, awake, not in acute distress, morbidly obese Cardiovascular: RRR, S1/S2 +, no rubs, no gallops Respiratory: CTA bilaterally, no wheezing, no rhonchi Abdominal: Soft, NT, ND, bowel sounds + Extremities: no edema, no cyanosis  Discharge Instructions  Discharge Instructions    Discharge instructions   Complete by: As directed    Recheck phenobarbital and phenytoin levels (trough levels) in 1 week with further adjustments as needed; recommend close follow-up with [facility] clinic (Dr. Malvin Johns).  Please note given the complex interactions of these 2 medications, repeat level checked early the week of May 9 is recommended.  Please call call MD or return to ER for similar or worsening recurring problem that brought you to hospital or if any fever,nausea/vomiting,abdominal pain, uncontrolled pain, chest pain,  shortness of breath or any other alarming symptoms.  Please follow-up your doctor as instructed in a week time and call the office for appointment.  Please avoid alcohol, smoking, or any other illicit substance and maintain healthy habits including taking your regular medications as prescribed.  You were cared for by a hospitalist during your hospital stay. If you have any questions about your discharge medications or the care you received while you were in the hospital after you are discharged, you can call the unit and ask to speak with the hospitalist on call if the hospitalist that took care of you is not available.  Once you are discharged, your primary care physician will handle any further medical issues. Please note that NO REFILLS for any discharge medications will be authorized once you are discharged, as it is imperative that you return to your primary care physician (or establish a relationship with a primary care physician if  you do not have one) for your aftercare needs so that they can reassess your need for medications and monitor your lab values   Increase activity slowly   Complete by: As directed      Allergies as of 08/14/2020   No Known Allergies     Medication List    TAKE these medications   acetaminophen 325 MG tablet Commonly known as: TYLENOL Take 650 mg by mouth every 6 (six) hours as needed.   cephALEXin 500 MG capsule Commonly known as: KEFLEX Take 1 capsule (500 mg total) by mouth 4 (four) times daily for 2 days.   diphenhydrAMINE 25 MG tablet Commonly known as: BENADRYL Take 25 mg by mouth at bedtime.   ipratropium-albuterol 0.5-2.5 (3) MG/3ML Soln Commonly known as: DUONEB Take 3 mLs by nebulization every 4 (four) hours as needed. PRN wheezing, SOB   levETIRAcetam 750 MG tablet Commonly known as: KEPPRA Take 1,500 mg by mouth 2 (two) times daily.   loratadine 10 MG tablet Commonly known as: CLARITIN Take 10 mg by mouth daily.   multivitamin with minerals Tabs tablet Take 1 tablet by mouth daily.   omeprazole 20 MG capsule Commonly known as: PRILOSEC Take 1 capsule (20 mg  total) by mouth daily.   phenobarbital 16.2 MG tablet Commonly known as: LUMINAL Take 2 tablets (32.4 mg total) by mouth 3 (three) times daily. START EVENING of 08/16/20 Start taking on: Aug 16, 2020 What changed:   how much to take  additional instructions  These instructions start on Aug 16, 2020. If you are unsure what to do until then, ask your doctor or other care provider.   phenytoin 100 MG ER capsule Commonly known as: DILANTIN Take 400 mg by mouth at bedtime.   phenytoin 100 MG ER capsule Commonly known as: DILANTIN Take 200 mg by mouth daily.   SPS 15 GM/60ML suspension Generic drug: sodium polystyrene Take 60 mLs by mouth every Wednesday.       Follow-up Information    Keane Police, MD Follow up in 1 week(s).   Specialty: Family Medicine Contact information: [address] [place] [address] [phone]        Morene Crocker, MD Follow up in 4 day(s).   Specialty: Neurology Why:  Recheck phenobarbital and phenytoin levels (trough levels) in 1 week with further adjustments as needed; recommend close follow-up with [facility] clinic (Dr. Malvin Johns).  Please note given the complex interactions of these 2 medications, repeat level checke Contact information: [address] [facility] [place] [address] [phone]              No Known Allergies  The results of significant diagnostics from this hospitalization (including imaging, microbiology, ancillary and laboratory) are listed below for reference.    Microbiology: Recent Results (from the past 240 hour(s))  Urine culture     Status: Abnormal   Collection Time: 08/12/20  2:56 PM   Specimen: Urine, Random  Result Value Ref Range Status   Specimen Description   Final    URINE, RANDOM Performed at [facility], [address], [place], [place] [address]    Special Requests   Final    NONE Performed at [facility], [address] [place], [place] [address]    Culture MULTIPLE SPECIES PRESENT, SUGGEST RECOLLECTION (A)  Final   Report Status 08/14/2020 FINAL  Final  Resp Panel by RT-PCR (Flu A&B, Covid) Nasopharyngeal Swab     Status: None   Collection Time: 08/12/20  4:58 PM   Specimen: Nasopharyngeal Swab; Nasopharyngeal(NP) swabs in vial transport medium  Result Value Ref Range Status   SARS Coronavirus 2 by RT PCR NEGATIVE NEGATIVE Final    Comment: (NOTE) SARS-CoV-2 target nucleic acids are NOT DETECTED.  The SARS-CoV-2 RNA is generally detectable in upper respiratory specimens during the acute phase of infection. The lowest concentration of SARS-CoV-2 viral copies this assay can detect is 138 copies/mL. A negative result does not preclude SARS-Cov-2 infection and should not be used as the sole basis for treatment  or other patient management decisions. A negative result may occur with  improper specimen collection/handling, submission of specimen other than nasopharyngeal swab, presence of viral mutation(s) within the areas targeted by this assay, and inadequate number of viral copies(<138 copies/mL). A negative result must be combined with clinical observations, patient history, and epidemiological information. The expected result is Negative.  Fact Sheet for Patients:  BloggerCourse.com  Fact Sheet for Healthcare Providers:  SeriousBroker.it  This test is no t yet approved or cleared by the [place] FDA and  has been authorized for detection and/or diagnosis of SARS-CoV-2 by FDA under an Emergency Use Authorization (EUA). This EUA will remain  in effect (meaning this test can be used) for the duration of the COVID-19 declaration under Section 564(b)(1) of the Act, 21 U.S.C.section 360bbb-3(b)(1), unless the authorization is terminated  or revoked sooner.       Influenza A by PCR NEGATIVE NEGATIVE Final   Influenza B by PCR NEGATIVE NEGATIVE Final    Comment: (NOTE) The Xpert Xpress SARS-CoV-2/FLU/RSV plus assay is intended as an aid in the diagnosis of influenza from Nasopharyngeal swab specimens and should not be used as a sole basis for treatment. Nasal washings and aspirates are unacceptable for Xpert Xpress SARS-CoV-2/FLU/RSV testing.  Fact Sheet for Patients: BloggerCourse.com  Fact Sheet for Healthcare Providers: SeriousBroker.it  This test is not yet approved or cleared by the [place] FDA and has been authorized for detection and/or diagnosis of SARS-CoV-2 by FDA under an Emergency Use Authorization (EUA). This EUA will remain in effect (meaning this test can be used) for the duration of the COVID-19 declaration under Section 564(b)(1) of the Act, 21 U.S.C. section  360bbb-3(b)(1), unless the authorization is terminated or revoked.  Performed at [facility], [address], [place], [place] [address]     Procedures/Studies: CT Head Wo Contrast  Result Date: 08/12/2020 CLINICAL DATA:  Seizure, nontraumatic. EXAM: CT HEAD WITHOUT CONTRAST TECHNIQUE: Contiguous axial images were obtained from the base of the skull through the vertex without intravenous contrast. COMPARISON:  CT Aug 18, 2019. FINDINGS: Brain: No evidence of acute infarction, hemorrhage, hydrocephalus, extra-axial collection or mass lesion/mass effect. Similar chronic cerebellar atrophy. Vascular: Calcific atherosclerosis. No hyperdense vessel identified. Skull: Diffuse calvarial thickening. Sinuses/Orbits: Mucosal thickening of scattered ethmoid air cells with frothy secretions in the left frontal sinus. Other: Small left mastoid effusion. IMPRESSION: 1. No evidence of acute intracranial abnormality. MRI could provide more sensitive evaluation for an anatomic epileptogenic abnormality, if clinically indicated. 2. Chronic cerebellar atrophy and diffuse calvarial thickening, possibly the sequela of long-term anti seizure medication. 3. Small left mastoid effusion and left frontoethmoidal sinus disease, detailed above. Electronically Signed   By: Feliberto Harts MD   On: 08/12/2020 13:56   CT Soft Tissue Neck W Contrast  Result Date: 08/12/2020 CLINICAL DATA:  Suspect epiglottitis or tonsillitis. Neck and submandibular swelling. Recent seizure. EXAM: CT NECK WITH CONTRAST TECHNIQUE: Multidetector CT imaging of the neck was performed using the standard protocol following the bolus administration of intravenous contrast. CONTRAST:  75mL OMNIPAQUE IOHEXOL 300 MG/ML  SOLN COMPARISON:  CT head 08/12/2020 FINDINGS: Pharynx and larynx: Normal. No mass or swelling. Salivary glands: No inflammation, mass, or stone. Thyroid: Negative thyroid Lymph nodes: No enlarged lymph nodes in the neck. Vascular:  Normal vascular enhancement. Limited intracranial: Negative Visualized orbits: Negative Mastoids and visualized paranasal sinuses: Mild mucosal edema in the ethmoid sinuses and left maxillary sinus. No air-fluid level. Mild left mastoid effusion. Right mastoid clear Skeleton: Cervical degenerative changes and anterior spurring. No acute skeletal abnormality. Upper chest: Lung apices clear bilaterally. Other: Mass lesion in the right anterior neck above the thyroid and appears attached to the strap muscles. The mass measures 2.5 cm is predominately fat in density with a rim of surrounding soft tissue. No edema in the adjacent fat. IMPRESSION: Negative for peritonsillar abscess or airway narrowing. 2.5 cm fat containing mass in the right anterior neck with a benign appearance. Electronically Signed   By: Marlan Palau M.D.   On: 08/12/2020 16:13   DG Chest Portable 1 View  Result Date: 08/12/2020 CLINICAL DATA:  Seizure EXAM: PORTABLE CHEST  1 VIEW COMPARISON:  10/04/2019 FINDINGS: Low lung volumes. Basilar atelectasis. No effusion, infiltrate or pneumothorax. Mild venous congestion fracture. IMPRESSION: Low lung volumes and venous congestion. No fracture or pneumothorax. Electronically Signed   By: Genevive BiStewart  Edmunds M.D.   On: 08/12/2020 13:50    Labs: BNP (last 3 results) Recent Labs    08/18/19 1337 10/05/19 0038  BNP 32.0 35.9   Basic Metabolic Panel: Recent Labs  Lab 08/12/20 1455 08/13/20 0439 08/14/20 0333  NA 142 144 145  K 3.3* 4.0 4.4  CL 108 110 111  CO2 26 27 28   GLUCOSE 126* 112* 75  BUN 14 10 12   CREATININE 0.50 0.46 0.43*  CALCIUM 8.5* 8.5* 8.5*   Liver Function Tests: Recent Labs  Lab 08/12/20 1455  AST 21  ALT 19  ALKPHOS 196*  BILITOT 0.3  PROT 7.4  ALBUMIN 2.8*   No results for input(s): LIPASE, AMYLASE in the last 168 hours. No results for input(s): AMMONIA in the last 168 hours. CBC: Recent Labs  Lab 08/12/20 1323 08/12/20 1923 08/13/20 0439  WBC 6.0  7.2 6.3  NEUTROABS 3.7  --   --   HGB 10.8* 10.2* 10.7*  HCT 33.4* 32.3* 33.9*  MCV 89.5 90.2 91.1  PLT 307 288 284   Cardiac Enzymes: Recent Labs  Lab 08/12/20 1455  CKTOTAL 56   BNP: Invalid input(s): POCBNP CBG: Recent Labs  Lab 08/12/20 1239  GLUCAP 150*   D-Dimer No results for input(s): DDIMER in the last 72 hours. Hgb A1c No results for input(s): HGBA1C in the last 72 hours. Lipid Profile No results for input(s): CHOL, HDL, LDLCALC, TRIG, CHOLHDL, LDLDIRECT in the last 72 hours. Thyroid function studies No results for input(s): TSH, T4TOTAL, T3FREE, THYROIDAB in the last 72 hours.  Invalid input(s): FREET3 Anemia work up No results for input(s): VITAMINB12, FOLATE, FERRITIN, TIBC, IRON, RETICCTPCT in the last 72 hours. Urinalysis    Component Value Date/Time   COLORURINE YELLOW (A) 08/12/2020 1456   APPEARANCEUR CLOUDY (A) 08/12/2020 1456   LABSPEC 1.015 08/12/2020 1456   PHURINE 5.0 08/12/2020 1456   GLUCOSEU NEGATIVE 08/12/2020 1456   HGBUR SMALL (A) 08/12/2020 1456   BILIRUBINUR NEGATIVE 08/12/2020 1456   KETONESUR NEGATIVE 08/12/2020 1456   PROTEINUR NEGATIVE 08/12/2020 1456   UROBILINOGEN 0.2 05/20/2013 0437   NITRITE NEGATIVE 08/12/2020 1456   LEUKOCYTESUR MODERATE (A) 08/12/2020 1456   Sepsis Labs Invalid input(s): PROCALCITONIN,  WBC,  LACTICIDVEN Microbiology Recent Results (from the past 240 hour(s))  Urine culture     Status: Abnormal   Collection Time: 08/12/20  2:56 PM   Specimen: Urine, Random  Result Value Ref Range Status   Specimen Description   Final    URINE, RANDOM Performed at [facility], [address] [place], [place] [address]    Special Requests   Final    NONE Performed at [facility], [address] [place], [place] [address]    Culture MULTIPLE SPECIES PRESENT, SUGGEST RECOLLECTION (A)  Final   Report Status 08/14/2020 FINAL  Final  Resp Panel by RT-PCR (Flu A&B, Covid) Nasopharyngeal Swab      Status: None   Collection Time: 08/12/20  4:58 PM   Specimen: Nasopharyngeal Swab; Nasopharyngeal(NP) swabs in vial transport medium  Result Value Ref Range Status   SARS Coronavirus 2 by RT PCR NEGATIVE NEGATIVE Final    Comment: (NOTE) SARS-CoV-2 target nucleic acids are NOT DETECTED.  The SARS-CoV-2 RNA is generally detectable in upper  respiratory specimens during the acute phase of infection. The lowest concentration of SARS-CoV-2 viral copies this assay can detect is 138 copies/mL. A negative result does not preclude SARS-Cov-2 infection and should not be used as the sole basis for treatment or other patient management decisions. A negative result may occur with  improper specimen collection/handling, submission of specimen other than nasopharyngeal swab, presence of viral mutation(s) within the areas targeted by this assay, and inadequate number of viral copies(<138 copies/mL). A negative result must be combined with clinical observations, patient history, and epidemiological information. The expected result is Negative.  Fact Sheet for Patients:  BloggerCourse.com  Fact Sheet for Healthcare Providers:  SeriousBroker.it  This test is no t yet approved or cleared by the [place] FDA and  has been authorized for detection and/or diagnosis of SARS-CoV-2 by FDA under an Emergency Use Authorization (EUA). This EUA will remain  in effect (meaning this test can be used) for the duration of the COVID-19 declaration under Section 564(b)(1) of the Act, 21 U.S.C.section 360bbb-3(b)(1), unless the authorization is terminated  or revoked sooner.       Influenza A by PCR NEGATIVE NEGATIVE Final   Influenza B by PCR NEGATIVE NEGATIVE Final    Comment: (NOTE) The Xpert Xpress SARS-CoV-2/FLU/RSV plus assay is intended as an aid in the diagnosis of influenza from Nasopharyngeal swab specimens and should not be used as a sole basis  for treatment. Nasal washings and aspirates are unacceptable for Xpert Xpress SARS-CoV-2/FLU/RSV testing.  Fact Sheet for Patients: BloggerCourse.com  Fact Sheet for Healthcare Providers: SeriousBroker.it  This test is not yet approved or cleared by the [place] FDA and has been authorized for detection and/or diagnosis of SARS-CoV-2 by FDA under an Emergency Use Authorization (EUA). This EUA will remain in effect (meaning this test can be used) for the duration of the COVID-19 declaration under Section 564(b)(1) of the Act, 21 U.S.C. section 360bbb-3(b)(1), unless the authorization is terminated or revoked.  Performed at [facility], [address] [place], [place] [address]      Time coordinating discharge: 25 minutes  SIGNED: Lanae Boast, MD  [facility] 08/14/2020, 11:09 AM  If 7PM-7AM, please contact night-coverage [email or web address]

## 2020-08-14 NOTE — Progress Notes (Signed)
  Speech Language Pathology Treatment: Dysphagia  Patient Details Name: Mariah Jimenez MRN: [id number] DOB: Jul 18, 1945 Today's Date: 08/14/2020 Time: 2641-5830 SLP Time Calculation (min) (ACUTE ONLY): 18 min  Assessment / Plan / Recommendation Clinical Impression  Skilled treatment session focused on management of pt's dysphagia. Pt's sister present and reports that pt's baseline diet was puree. Pt is awake and continues to consume puree with thin liquids via straw without any overt s/s of aspiration. Per pt's sister, pt is at baseline abilities. ST intervention is no longer indicated.     HPI HPI: Pt is a 75 year old female with PMH significant for autism/intellectual disability and seizures who presents from her nursing home with seizure activity. Head CT was negative for any acute intracranial abnormality; chest x-ray revealed low lung volumes.      SLP Plan  All goals met       Recommendations  Diet recommendations: Dysphagia 1 (puree);Thin liquid Liquids provided via: Straw Medication Administration: Crushed with puree Supervision: Staff to assist with self feeding;Full supervision/cueing for compensatory strategies Compensations: Minimize environmental distractions;Slow rate;Small sips/bites Postural Changes and/or Swallow Maneuvers: Seated upright 90 degrees                Oral Care Recommendations: Oral care BID Follow up Recommendations: Skilled Nursing facility SLP Visit Diagnosis: Dysphagia, oropharyngeal phase (R13.12) Plan: All goals met       GO               Nikoloz Huy B. Rutherford Nail M.S., CCC-SLP, [place] Office [phone]  [place] Arney Rutherford Nail 08/14/2020, 10:35 AM

## 2020-08-14 NOTE — TOC Transition Note (Signed)
Transition of Care [facility]) - CM/SW Discharge Note   Patient Details  Name: Chevelle Coulson MRN: [id number] Date of Birth: 12-31-1945  [facility] CM/SW Contact:  Allayne Butcher, RN Phone Number: 08/14/2020, 11:41 AM   Clinical Narrative:    Ready for discharge back to LTC facility [facility].    Final next level of care: Skilled Nursing Facility Barriers to Discharge: Barriers Resolved   Patient Goals and CMS Choice Patient states their goals for this hospitalization and ongoing recovery are:: Will return to [facility]TC      Discharge Placement              Patient chooses bed at: [facility] Patient to be transferred to facility by: [facility] Name of family member notified: [name] Patient and family notified of of transfer: 08/14/20  Discharge Plan and Services   Discharge Planning Services: CM Consult            DME Arranged: N/A DME Agency: NA       HH Arranged: NA HH Agency: NA        Social Determinants of Health (SDOH) Interventions     Readmission Risk Interventions No flowsheet data found.

## 2020-08-15 LAB — URINE CULTURE: Culture: 10000 — AB

## 2020-08-17 DIAGNOSIS — I639 Cerebral infarction, unspecified: Secondary | ICD-10-CM | POA: Diagnosis not present

## 2020-08-17 DIAGNOSIS — R404 Transient alteration of awareness: Secondary | ICD-10-CM | POA: Diagnosis not present

## 2020-08-17 DIAGNOSIS — I440 Atrioventricular block, first degree: Secondary | ICD-10-CM | POA: Diagnosis not present

## 2020-08-17 DIAGNOSIS — F840 Autistic disorder: Secondary | ICD-10-CM | POA: Diagnosis not present

## 2020-08-17 DIAGNOSIS — R4182 Altered mental status, unspecified: Secondary | ICD-10-CM | POA: Diagnosis not present

## 2020-08-17 DIAGNOSIS — Z20822 Contact with and (suspected) exposure to covid-19: Secondary | ICD-10-CM | POA: Diagnosis not present

## 2020-08-17 DIAGNOSIS — Z79899 Other long term (current) drug therapy: Secondary | ICD-10-CM | POA: Diagnosis not present

## 2020-08-17 DIAGNOSIS — F79 Unspecified intellectual disabilities: Secondary | ICD-10-CM | POA: Diagnosis not present

## 2020-08-17 DIAGNOSIS — R29818 Other symptoms and signs involving the nervous system: Secondary | ICD-10-CM | POA: Diagnosis not present

## 2020-08-17 DIAGNOSIS — Z5181 Encounter for therapeutic drug level monitoring: Secondary | ICD-10-CM | POA: Diagnosis not present

## 2020-08-19 DIAGNOSIS — G40909 Epilepsy, unspecified, not intractable, without status epilepticus: Secondary | ICD-10-CM | POA: Diagnosis not present

## 2020-08-19 DIAGNOSIS — N184 Chronic kidney disease, stage 4 (severe): Secondary | ICD-10-CM | POA: Diagnosis not present

## 2020-08-19 DIAGNOSIS — R5381 Other malaise: Secondary | ICD-10-CM | POA: Diagnosis not present

## 2020-08-19 DIAGNOSIS — E1122 Type 2 diabetes mellitus with diabetic chronic kidney disease: Secondary | ICD-10-CM | POA: Diagnosis not present

## 2020-09-10 DIAGNOSIS — R945 Abnormal results of liver function studies: Secondary | ICD-10-CM | POA: Diagnosis not present

## 2020-09-10 DIAGNOSIS — Z79899 Other long term (current) drug therapy: Secondary | ICD-10-CM | POA: Diagnosis not present

## 2020-09-10 DIAGNOSIS — D649 Anemia, unspecified: Secondary | ICD-10-CM | POA: Diagnosis not present

## 2020-09-10 DIAGNOSIS — Z5181 Encounter for therapeutic drug level monitoring: Secondary | ICD-10-CM | POA: Diagnosis not present

## 2020-09-12 ENCOUNTER — Other Ambulatory Visit: Payer: Self-pay

## 2020-09-12 ENCOUNTER — Non-Acute Institutional Stay: Payer: Medicare Other | Admitting: Primary Care

## 2020-09-12 DIAGNOSIS — Z515 Encounter for palliative care: Secondary | ICD-10-CM

## 2020-09-12 DIAGNOSIS — F79 Unspecified intellectual disabilities: Secondary | ICD-10-CM

## 2020-09-12 DIAGNOSIS — R5381 Other malaise: Secondary | ICD-10-CM

## 2020-09-12 NOTE — Progress Notes (Addendum)
Therapist, nutritional Palliative Care Consult Note Telephone: [phone]  Fax: [phone]    Date of encounter: 09/12/20 PATIENT NAME: Mariah Jimenez [address] [place] [address]   [phone] (home)  DOB: [birth year] MRN: [id number] PRIMARY CARE PROVIDER:    Keane Police, MD,  [phone] [place] [address] [phone]  REFERRING PROVIDER:   Keane Police, MD [phone] [place],  [place] [address] [phone]  RESPONSIBLE PARTY:    Contact Information    Name Relation Home Work Mobile   [place] Sister   [phone]      I met face to face with patient in her facility. Palliative Care was asked to follow this patient by consultation request of  Slade-Hartman, Alvino Chapel* to address advance care planning and complex medical decision making. This is a follow up visit.                                   ASSESSMENT AND PLAN / RECOMMENDATIONS:   Advance Care Planning/Goals of Care: Goals include to maximize quality of life and symptom management.   CODE STATUS: DNR  Symptom Management/Plan:  Met with patient in her nursing home room. She was sitting in high [place]'s feeding herself pured diet for lunch. She was eating well and stated she liked her food. She has severe developmental delays and can only converse in simple sentences.   Recent admission and ED for seizures. Staff do not report recent seizure activity and pt is to follow with neurology for med blood levels. Staff denied any concerns and she is being maintained well with a current medication regimen as well as pured diet.   She recently has changed rooms due to facility renovations and has a new roommate. I spoke with her sister and power of attorney Annamarie Major. [name] stated she thought her sister was well and did not have any concerns for advance care planning or symptom management at this time.  Will continue to follow 2 to 3 times a  year to monitor for palliative needs and as a resource to family.   Follow up Palliative Care Visit: Palliative care will continue to follow for complex medical decision making, advance care planning, and clarification of goals. Return 12-16 weeks or prn.  I spent 25 minutes providing this consultation. More than 50% of the time in this consultation was spent in counseling and care coordination.  PPS: 40%  HOSPICE ELIGIBILITY/DIAGNOSIS: no  Chief Complaint: debility  HISTORY OF PRESENT ILLNESS:  Mariah Jimenez is a 75 y.o. year old female  with debility,  Seizure disorder.  History obtained from review of EMR, discussion with primary team, and interview with family, facility staff/caregiver and/or Ms. Stumpo.  I reviewed available labs, medications, imaging, studies and related documents from the EMR.  Records reviewed and summarized above.   ROS/STAFF  General: NAD ENMT: denies dysphagia Pulmonary: denies cough, denies increased SOB Abdomen: endorses good appetite, denies constipation, endorses incontinence of bowel GU: denies dysuria, endorses incontinence of urine MSK:  endorses weakness,  no falls reported Skin: denies rashes or wounds Neurological: denies pain, denies insomnia Psych: Endorses positive mood Heme/lymph/immuno: denies bruises, abnormal bleeding  Physical Exam: Current and past weights: 268 lbs, loss in 3 months = 7% Constitutional: NAD General: frail appearing, obese EYES: anicteric sclera, lids intact, no discharge  ENMT: intact hearing, oral mucous membranes moist, edentulous,  CV: no LE edema  Pulmonary:  no increased work of breathing, no cough, room air Abdomen: intake 100%,  no ascites MSK: mild  sarcopenia, moves all extremities, non ambulatory Skin: warm and dry, no rashes or wounds on visible skin Neuro:  moderate generalized weakness,  severe cognitive impairment Psych: non-anxious affect, A and O x 1 Hem/lymph/immuno: no widespread  bruising  Outpatient Encounter Medications as of 09/12/2020  Medication Sig  . acetaminophen (TYLENOL) 325 MG tablet Take 650 mg by mouth every 6 (six) hours as needed.  . diphenhydrAMINE (BENADRYL) 25 MG tablet Take 25 mg by mouth at bedtime.  Marland Kitchen ipratropium-albuterol (DUONEB) 0.5-2.5 (3) MG/3ML SOLN Take 3 mLs by nebulization every 4 (four) hours as needed. PRN wheezing, SOB  . levETIRAcetam (KEPPRA) 750 MG tablet Take 1,500 mg by mouth 2 (two) times daily.  Marland Kitchen loratadine (CLARITIN) 10 MG tablet Take 10 mg by mouth daily.  . Multiple Vitamin (MULTIVITAMIN WITH MINERALS) TABS tablet Take 1 tablet by mouth daily.  Marland Kitchen omeprazole (PRILOSEC) 20 MG capsule Take 1 capsule (20 mg total) by mouth daily.  . phenobarbital (LUMINAL) 16.2 MG tablet Take 2 tablets (32.4 mg total) by mouth 3 (three) times daily. START EVENING of 08/16/20  . phenytoin (DILANTIN) 100 MG ER capsule Take 400 mg by mouth at bedtime.   . Probiotic Product (ACIDOPHILUS) CHEW Chew 1 tablet by mouth in the morning, at noon, and at bedtime.  . SPS 15 GM/60ML suspension Take 60 mLs by mouth every Wednesday.  Marland Kitchen VIMPAT 50 MG TABS tablet Take 50 mg by mouth 2 (two) times daily.  . phenytoin (DILANTIN) 100 MG ER capsule Take 200 mg by mouth daily.   No facility-administered encounter medications on file as of 09/12/2020.    Thank you for the opportunity to participate in the care of Ms. Matthis.  The palliative care team will continue to follow. Please call our office at [phone] if we can be of additional assistance.   Jason Coop, NP , DNP, MPH, AGPCNP-BC, ACHPN  COVID-19 PATIENT SCREENING TOOL Asked and negative response unless otherwise noted:   Have you had symptoms of covid, tested positive or been in contact with someone with symptoms/positive test in the past 5-10 days?

## 2020-09-18 ENCOUNTER — Emergency Department: Payer: Medicare Other

## 2020-09-18 ENCOUNTER — Emergency Department
Admission: EM | Admit: 2020-09-18 | Discharge: 2020-09-21 | Disposition: A | Discharge status: Home / Self Care | Payer: Medicare Other | Attending: Emergency Medicine | Admitting: Emergency Medicine

## 2020-09-18 ENCOUNTER — Other Ambulatory Visit: Payer: Self-pay

## 2020-09-18 DIAGNOSIS — R739 Hyperglycemia, unspecified: Secondary | ICD-10-CM | POA: Insufficient documentation

## 2020-09-18 DIAGNOSIS — F840 Autistic disorder: Secondary | ICD-10-CM | POA: Diagnosis not present

## 2020-09-18 DIAGNOSIS — R410 Disorientation, unspecified: Secondary | ICD-10-CM | POA: Diagnosis not present

## 2020-09-18 DIAGNOSIS — R609 Edema, unspecified: Secondary | ICD-10-CM | POA: Diagnosis not present

## 2020-09-18 DIAGNOSIS — R9431 Abnormal electrocardiogram [ECG] [EKG]: Secondary | ICD-10-CM | POA: Diagnosis not present

## 2020-09-18 DIAGNOSIS — G40909 Epilepsy, unspecified, not intractable, without status epilepticus: Secondary | ICD-10-CM | POA: Insufficient documentation

## 2020-09-18 DIAGNOSIS — G4089 Other seizures: Secondary | ICD-10-CM | POA: Diagnosis not present

## 2020-09-18 DIAGNOSIS — R4182 Altered mental status, unspecified: Secondary | ICD-10-CM | POA: Diagnosis not present

## 2020-09-18 DIAGNOSIS — I959 Hypotension, unspecified: Secondary | ICD-10-CM | POA: Diagnosis not present

## 2020-09-18 DIAGNOSIS — E1165 Type 2 diabetes mellitus with hyperglycemia: Secondary | ICD-10-CM | POA: Diagnosis not present

## 2020-09-18 DIAGNOSIS — R569 Unspecified convulsions: Secondary | ICD-10-CM | POA: Diagnosis not present

## 2020-09-18 DIAGNOSIS — Z7401 Bed confinement status: Secondary | ICD-10-CM | POA: Diagnosis not present

## 2020-09-18 DIAGNOSIS — R404 Transient alteration of awareness: Secondary | ICD-10-CM | POA: Diagnosis not present

## 2020-09-18 DIAGNOSIS — M2550 Pain in unspecified joint: Secondary | ICD-10-CM | POA: Diagnosis not present

## 2020-09-18 LAB — CBC WITH DIFFERENTIAL/PLATELET
Abs Immature Granulocytes: 0.03 10*3/uL (ref 0.00–0.07)
Basophils Absolute: 0 10*3/uL (ref 0.0–0.1)
Basophils Relative: 1 %
Eosinophils Absolute: 0.1 10*3/uL (ref 0.0–0.5)
Eosinophils Relative: 2 %
HCT: 33.8 % — ABNORMAL LOW (ref 36.0–46.0)
Hemoglobin: 10.9 g/dL — ABNORMAL LOW (ref 12.0–15.0)
Immature Granulocytes: 1 %
Lymphocytes Relative: 24 %
Lymphs Abs: 1.4 10*3/uL (ref 0.7–4.0)
MCH: 29.6 pg (ref 26.0–34.0)
MCHC: 32.2 g/dL (ref 30.0–36.0)
MCV: 91.8 fL (ref 80.0–100.0)
Monocytes Absolute: 0.2 10*3/uL (ref 0.1–1.0)
Monocytes Relative: 3 %
Neutro Abs: 4.2 10*3/uL (ref 1.7–7.7)
Neutrophils Relative %: 69 %
Platelets: 247 10*3/uL (ref 150–400)
RBC: 3.68 MIL/uL — ABNORMAL LOW (ref 3.87–5.11)
RDW: 15.8 % — ABNORMAL HIGH (ref 11.5–15.5)
WBC: 6 10*3/uL (ref 4.0–10.5)
nRBC: 0 % (ref 0.0–0.2)

## 2020-09-18 LAB — BASIC METABOLIC PANEL
Anion gap: 10 (ref 5–15)
BUN: 11 mg/dL (ref 8–23)
CO2: 21 mmol/L — ABNORMAL LOW (ref 22–32)
Calcium: 8.9 mg/dL (ref 8.9–10.3)
Chloride: 105 mmol/L (ref 98–111)
Creatinine, Ser: 0.62 mg/dL (ref 0.44–1.00)
GFR, Estimated: >60 mL/min (ref >60)
Glucose, Bld: 236 mg/dL — ABNORMAL HIGH (ref 70–99)
Potassium: 3.6 mmol/L (ref 3.5–5.1)
Sodium: 136 mmol/L (ref 135–145)

## 2020-09-18 LAB — CBG MONITORING, ED: Glucose-Capillary: 168 mg/dL — ABNORMAL HIGH (ref 70–99)

## 2020-09-18 LAB — PHENOBARBITAL LEVEL: Phenobarbital: 28.1 ug/mL (ref 15.0–30.0)

## 2020-09-18 MED ORDER — PHENOBARBITAL 32.4 MG PO TABS
32.4000 mg | ORAL_TABLET | Freq: Three times a day (TID) | ORAL | Status: DC
  Administered 2020-09-18: 32.4 mg via ORAL
  Filled 2020-09-18 (×4): qty 1

## 2020-09-18 MED ORDER — INSULIN ASPART 100 UNIT/ML IJ SOLN
0.0000 [IU] | INTRAMUSCULAR | Status: DC
  Administered 2020-09-18: 3 [IU] via SUBCUTANEOUS
  Filled 2020-09-18: qty 1

## 2020-09-18 NOTE — ED Triage Notes (Signed)
Pt presents to the [facility] via EMS from [employer] with c/o seizure. EMS states that pt began having seizure like activity and was given 1mg  Ativan IM. Seizure activity did not stop so an additional 1mg  Ativan IM was given. [facility] staff report total of of seizure activity before seizure activity stopped. Pt currently alert but nonverbal with stable vital signs upon arrival to ED.

## 2020-09-18 NOTE — ED Notes (Signed)
[facility] called for transport back to [facility] 

## 2020-09-18 NOTE — ED Provider Notes (Signed)
[facility] Emergency Department Provider Note  ____________________________________________   Event Date/Time   First MD Initiated Contact with Patient 09/18/20 1414     (approximate)  I have reviewed the triage vital signs and the nursing notes.   HISTORY  Chief Complaint Seizures   HPI Mariah Jimenez is a 75 y.o. female with history of autism/intellectual disability, minimally verbal at baseline living in nursing home with history of seizure disorder only admitted for seizure with prolonged postictal period 5/3-5/5 currently prescribed phenytoin, Keppra and Dilantin who presents via EMS for assessment after a reported witnessed seizure.  Patient reportedly received 2 times and 1 mg of Ativan during 30-minute period of generalized tonic-clonic shaking which eventually resulted in cessation.  On EMS arrival did not see any seizure-like activity and patient had normal vital signs blood sugar in the 200s.  She seemed very sleepy with EMS but otherwise was transported without any issues.  She is unemployed and history on arrival secondary to being very sleepy and on review of records nearly nonverbal at baseline.  Was able to reach a staff member at her facility confirmed that she had approximately 30-minute witnessed seizure.  She states the patient is sometimes able to mumble a few words but not typically able to engage in normal conversation and is not only oriented.  States the patient is otherwise been in her usual state of health without any recent falls, injuries, fevers, cough, vomiting, diarrhea or any other recent sick symptoms.  She has been taking all of her seizure medicines.  Patient's daughter also arrived to the ED at bedside who confirmed that patient's baseline is only able to [facility] few words and that she denies any other acute concerns at this time.         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   Gastric polyp    Class 3 obesity 10/07/2019   Sinus tachycardia 10/05/2019   Pressure injury of skin 10/05/2019   Dehydration 10/05/2019   Intractable nausea and vomiting 10/04/2019   Intellectual disability 10/04/2019   Elevated lipase 10/04/2019   Seizure disorder (HCC) 08/18/2019   UTI (urinary tract infection) 08/18/2019   Hypothermia 08/18/2019   Acute respiratory failure with hypoxia (HCC) 08/18/2019   Palliative care encounter 10/06/2018   Debility 10/06/2018   Tibia/fibula fracture 04/09/2016    Past Surgical History:  Procedure Laterality Date   ESOPHAGOGASTRODUODENOSCOPY (EGD) WITH PROPOFOL N/A 10/10/2019   Procedure: ESOPHAGOGASTRODUODENOSCOPY (EGD) WITH PROPOFOL;  Surgeon: Pasty Spillersahiliani, Varnita B, MD;  Location: [facility] ENDOSCOPY;  Service: Endoscopy;  Laterality: N/A;   NO PAST SURGERIES     TIBIA IM NAIL INSERTION Right 04/09/2016   Procedure: INTRAMEDULLARY (IM) NAIL TIBIAL;  Surgeon: Christena FlakeJohn J Poggi, MD;  Location: [facility] ORS;  Service: Orthopedics;  Laterality: Right;    Prior to Admission medications   Medication Sig Start Date End Date Taking? Authorizing Provider  acetaminophen (TYLENOL) 325 MG tablet Take 650 mg by mouth every 6 (six) hours as needed.    [provider]  diphenhydrAMINE (BENADRYL) 25 MG tablet Take 25 mg by mouth at bedtime.    [provider]  ipratropium-albuterol (DUONEB) 0.5-2.5 (3) MG/3ML SOLN Take 3 mLs by nebulization every 4 (four) hours as needed. PRN wheezing, SOB    [provider]  levETIRAcetam (KEPPRA) 750 MG  tablet Take 1,500 mg by mouth 2 (two) times daily.    [provider]  loratadine (CLARITIN) 10 MG tablet Take 10 mg by mouth daily.    [provider]  Multiple Vitamin (MULTIVITAMIN WITH MINERALS) TABS tablet Take 1 tablet by mouth daily.    [provider]  omeprazole (PRILOSEC) 20  MG capsule Take 1 capsule (20 mg total) by mouth daily. 10/07/19 03/18/20  Arrien, York Ram, MD  phenobarbital (LUMINAL) 16.2 MG tablet Take 2 tablets (32.4 mg total) by mouth 3 (three) times daily. START EVENING of 08/16/20 08/16/20   Lanae Boast, MD  phenytoin (DILANTIN) 100 MG ER capsule Take 400 mg by mouth at bedtime.     [provider]  phenytoin (DILANTIN) 100 MG ER capsule Take 200 mg by mouth daily.    [provider]  Probiotic Product (ACIDOPHILUS) CHEW Chew 1 tablet by mouth in the morning, at noon, and at bedtime. 07/17/20   [provider]  SPS 15 GM/60ML suspension Take 60 mLs by mouth every Wednesday. 06/16/19   [provider]  VIMPAT 50 MG TABS tablet Take 50 mg by mouth 2 (two) times daily. 08/19/20   [provider]    Allergies Patient has no known allergies.  Family History  Family history unknown: Yes    Social History Social History   Tobacco Use   Smoking status: Never   Smokeless tobacco: Never  Substance Use Topics   Alcohol use: No   Drug use: No    Review of Systems  Review of Systems  Unable to perform ROS: Patient nonverbal     ____________________________________________   PHYSICAL EXAM:  VITAL SIGNS: ED Triage Vitals  Enc Vitals Group     BP      Pulse      Resp      Temp      Temp src      SpO2      Weight      Height      Head Circumference      Peak Flow      Pain Score      Pain Loc      Pain Edu?      Excl. in GC?    Vitals:   09/18/20 1425  BP: 115/63  Pulse: 99  Resp: 18  Temp: 98.1 F (36.7 C)  SpO2: 96%   Physical Exam Vitals and nursing note reviewed.  Constitutional:      General: She is not in acute distress.    Appearance: She is well-developed. She is obese.  HENT:     Head: Normocephalic and atraumatic.     Right Ear: External ear normal.     Left Ear: External ear normal.     Nose: Nose normal.     Mouth/Throat:     Mouth: Mucous membranes are moist.   Eyes:     Conjunctiva/sclera: Conjunctivae normal.  Cardiovascular:     Rate and Rhythm: Normal rate and regular rhythm.     Heart sounds: No murmur heard. Pulmonary:     Effort: Pulmonary effort is normal. No respiratory distress.     Breath sounds: Normal breath sounds.  Abdominal:     Palpations: Abdomen is soft.     Tenderness: There is no abdominal tenderness.  Musculoskeletal:     Cervical back: Neck supple.     Right lower leg: Edema present.     Left lower leg: Edema present.  Skin:  General: Skin is warm and dry.  Neurological:     Mental Status: Mental status is at baseline. She is disoriented.    Patient able to track this examiner around the room.  She is able to mumble a few incomprehensible sounds which are apparently baseline.  She withdraws all extremities to noxious stimuli although does not otherwise participate in neuro exam.  PERRLA.  EOMI. ____________________________________________   LABS (all labs ordered are listed, but only abnormal results are displayed)  Labs Reviewed  CBC WITH DIFFERENTIAL/PLATELET - Abnormal; Notable for the following components:      Result Value   RBC 3.68 (*)    Hemoglobin 10.9 (*)    HCT 33.8 (*)    RDW 15.8 (*)    All other components within normal limits  BASIC METABOLIC PANEL - Abnormal; Notable for the following components:   CO2 21 (*)    Glucose, Bld 236 (*)    All other components within normal limits  PHENYTOIN LEVEL, FREE AND TOTAL  PHENOBARBITAL LEVEL  LEVETIRACETAM LEVEL  HEMOGLOBIN A1C   ____________________________________________  EKG  Sinus rhythm with a ventricular rate of 99, normal axis, unremarkable intervals without evidence of clear acute ischemia or significant underlying arrhythmia. ____________________________________________  RADIOLOGY  ED MD interpretation:  CT Head w/o any clear acute intracranial abnormality. No evidence of recent CVA/hemorrhage/mass.   Official radiology  report(s): CT Head Wo Contrast  Result Date: 09/18/2020 CLINICAL DATA:  Seizure. EXAM: CT HEAD WITHOUT CONTRAST TECHNIQUE: Contiguous axial images were obtained from the base of the skull through the vertex without intravenous contrast. COMPARISON:  Aug 12, 2020 FINDINGS: Brain: There is mild cerebral atrophy with widening of the extra-axial spaces and ventricular dilatation. There are areas of decreased attenuation within the white matter tracts of the supratentorial brain, consistent with microvascular disease changes. Vascular: No hyperdense vessel or unexpected calcification. Skull: Normal. Negative for fracture or focal lesion. Sinuses/Orbits: Moderate severity bilateral ethmoid sinus mucosal thickening is noted. Other: None. IMPRESSION: 1. Generalized cerebral atrophy. 2. No acute intracranial abnormality. Electronically Signed   By: Aram Candela M.D.   On: 09/18/2020 15:04    ____________________________________________   PROCEDURES  Procedure(s) performed (including Critical Care):  .1-3 Lead EKG Interpretation  Date/Time: 09/18/2020 3:42 PM Performed by: Gilles Chiquito, MD Authorized by: Gilles Chiquito, MD     Interpretation: normal     ECG rate assessment: normal     Rhythm: sinus rhythm     Ectopy: none     Conduction: normal     ____________________________________________   INITIAL IMPRESSION / ASSESSMENT AND PLAN / ED COURSE      Patient presents with above-stated history and exam for assessment after a seizure.  On arrival she is afebrile hemodynamically stable.  She does seem a little bit sleepy and is able to mumble a few incomprehensible sounds which seems her sister and nurse at this facility is very close to baseline.  No obvious new focal deficits or evidence of trauma.  No evidence of hypoxia.  CT head obtained shows no evidence of acute intracranial hemorrhage and ECG is unremarkable.  BMP shows no significant electrode or metabolic derangements Eliquis  is slightly elevated to 36.  No evidence of acidosis or DKA.  CT shows no leukocytosis or acute anemia.  She does not seem meningitic and has no fever or leukocytosis to suggest acute infectious process at this time.  Overall unclear allergy for patient's breakthrough seizure although it seems this is been  happening more frequently in the setting of fairly difficult to control epilepsy.  She was given a dose of her regular scheduled phenobarb and placed on sliding scale insulin emergency room.  Given she seems to be at her neurological baseline with stable vitals and reassuring basic labs and CT I think is reasonable for her to be discharged back to nursing facility with plan for outpatient neurology follow-up.  We will send antiepileptic levels today although advised sister that neurology wanted to follow this up.  Discharged stable condition.     ____________________________________________   FINAL CLINICAL IMPRESSION(S) / ED DIAGNOSES  Final diagnoses:  Seizure (HCC)  Hyperglycemia    Medications  insulin aspart (novoLOG) injection 0-15 Units (has no administration in time range)  PHENobarbital (LUMINAL) tablet 32.4 mg (has no administration in time range)     ED Discharge Orders     None        Note:  This document was prepared using Dragon voice recognition software and may include unintentional dictation errors.    Gilles Chiquito, MD 09/18/20 [phone]

## 2020-09-19 DIAGNOSIS — F79 Unspecified intellectual disabilities: Secondary | ICD-10-CM | POA: Diagnosis not present

## 2020-09-19 DIAGNOSIS — G4089 Other seizures: Secondary | ICD-10-CM | POA: Diagnosis not present

## 2020-09-19 LAB — HEMOGLOBIN A1C
Hgb A1c MFr Bld: 5.4 % (ref 4.8–5.6)
Mean Plasma Glucose: 108 mg/dL

## 2020-09-22 LAB — LEVETIRACETAM LEVEL: Levetiracetam Lvl: 64.1 ug/mL — ABNORMAL HIGH (ref 10.0–40.0)

## 2020-10-01 ENCOUNTER — Other Ambulatory Visit: Payer: Self-pay

## 2020-10-01 ENCOUNTER — Emergency Department
Admission: EM | Admit: 2020-10-01 | Discharge: 2020-10-01 | Disposition: A | Discharge status: Home / Self Care | Payer: Medicare Other | Attending: Student in an Organized Health Care Education/Training Program | Admitting: Student in an Organized Health Care Education/Training Program

## 2020-10-01 DIAGNOSIS — R569 Unspecified convulsions: Secondary | ICD-10-CM | POA: Diagnosis not present

## 2020-10-01 DIAGNOSIS — Z743 Need for continuous supervision: Secondary | ICD-10-CM | POA: Diagnosis not present

## 2020-10-01 DIAGNOSIS — G40909 Epilepsy, unspecified, not intractable, without status epilepticus: Secondary | ICD-10-CM | POA: Diagnosis not present

## 2020-10-01 DIAGNOSIS — I10 Essential (primary) hypertension: Secondary | ICD-10-CM | POA: Diagnosis not present

## 2020-10-01 DIAGNOSIS — R9431 Abnormal electrocardiogram [ECG] [EKG]: Secondary | ICD-10-CM | POA: Diagnosis not present

## 2020-10-01 DIAGNOSIS — Z79899 Other long term (current) drug therapy: Secondary | ICD-10-CM | POA: Diagnosis not present

## 2020-10-01 DIAGNOSIS — R0689 Other abnormalities of breathing: Secondary | ICD-10-CM | POA: Diagnosis not present

## 2020-10-01 DIAGNOSIS — R279 Unspecified lack of coordination: Secondary | ICD-10-CM | POA: Diagnosis not present

## 2020-10-01 DIAGNOSIS — R404 Transient alteration of awareness: Secondary | ICD-10-CM | POA: Diagnosis not present

## 2020-10-01 DIAGNOSIS — R0902 Hypoxemia: Secondary | ICD-10-CM | POA: Diagnosis not present

## 2020-10-01 LAB — CBC WITH DIFFERENTIAL/PLATELET
Abs Immature Granulocytes: 0.04 10*3/uL (ref 0.00–0.07)
Basophils Absolute: 0.1 10*3/uL (ref 0.0–0.1)
Basophils Relative: 1 %
Eosinophils Absolute: 0.3 10*3/uL (ref 0.0–0.5)
Eosinophils Relative: 4 %
HCT: 31 % — ABNORMAL LOW (ref 36.0–46.0)
Hemoglobin: 10 g/dL — ABNORMAL LOW (ref 12.0–15.0)
Immature Granulocytes: 1 %
Lymphocytes Relative: 35 %
Lymphs Abs: 2.2 10*3/uL (ref 0.7–4.0)
MCH: 29.1 pg (ref 26.0–34.0)
MCHC: 32.3 g/dL (ref 30.0–36.0)
MCV: 90.1 fL (ref 80.0–100.0)
Monocytes Absolute: 0.4 10*3/uL (ref 0.1–1.0)
Monocytes Relative: 6 %
Neutro Abs: 3.4 10*3/uL (ref 1.7–7.7)
Neutrophils Relative %: 53 %
Platelets: 200 10*3/uL (ref 150–400)
RBC: 3.44 MIL/uL — ABNORMAL LOW (ref 3.87–5.11)
RDW: 15 % (ref 11.5–15.5)
WBC: 6.4 10*3/uL (ref 4.0–10.5)
nRBC: 0 % (ref 0.0–0.2)

## 2020-10-01 LAB — BASIC METABOLIC PANEL
Anion gap: 7 (ref 5–15)
BUN: 7 mg/dL — ABNORMAL LOW (ref 8–23)
CO2: 24 mmol/L (ref 22–32)
Calcium: 9 mg/dL (ref 8.9–10.3)
Chloride: 106 mmol/L (ref 98–111)
Creatinine, Ser: 0.49 mg/dL (ref 0.44–1.00)
GFR, Estimated: >60 mL/min (ref >60)
Glucose, Bld: 119 mg/dL — ABNORMAL HIGH (ref 70–99)
Potassium: 4.1 mmol/L (ref 3.5–5.1)
Sodium: 137 mmol/L (ref 135–145)

## 2020-10-01 LAB — CBG MONITORING, ED: Glucose-Capillary: 113 mg/dL — ABNORMAL HIGH (ref 70–99)

## 2020-10-01 MED ORDER — LEVETIRACETAM IN NACL 1000 MG/100ML IV SOLN: 1000.0000 mg | Freq: Once | INTRAVENOUS | Status: DC

## 2020-10-01 MED ORDER — LEVETIRACETAM IN NACL 1500 MG/100ML IV SOLN
1500.0000 mg | Freq: Once | INTRAVENOUS | Status: AC
  Administered 2020-10-01: 1500 mg via INTRAVENOUS
  Filled 2020-10-01: qty 100

## 2020-10-01 MED ORDER — PHENOBARBITAL 32.4 MG PO TABS
32.4000 mg | ORAL_TABLET | Freq: Three times a day (TID) | ORAL | Status: DC
  Administered 2020-10-01: 32.4 mg via ORAL
  Filled 2020-10-01: qty 1

## 2020-10-01 NOTE — ED Notes (Signed)
[facility]  CALLED  FOR  TRANSPORT 

## 2020-10-01 NOTE — ED Triage Notes (Signed)
Pt here from [facility]. 2mg  ativan IM given at facility. Pt is at baseline. Hx of seizures and epilepsy. Recent decrease in seizure medication per staff. 103, 98% RA,50 co2, 96-cbg.

## 2020-10-01 NOTE — ED Provider Notes (Signed)
[facility] Emergency Department Provider Note    Event Date/Time   First MD Initiated Contact with Patient 10/01/20 1213     (approximate)  I have reviewed the triage vital signs and the nursing notes.   HISTORY  Chief Complaint Seizures    HPI Mariah Jimenez is a 75 y.o. female extensive seizure history presents to the ER after witnessed generalized tonic-clonic seizure that occurred this morning.  Patient received Ativan via EMS.  According to daughter at bedside states that patient likely missed doses yesterday.  No reported missed doses this morning.  She is now back to her baseline.  No report of any injury.  No fevers.  No other concerns.  Past Medical History:  Diagnosis Date   Mental retardation    Seizures (HCC)    Family History  Family history unknown: Yes   Past Surgical History:  Procedure Laterality Date   ESOPHAGOGASTRODUODENOSCOPY (EGD) WITH PROPOFOL N/A 10/10/2019   Procedure: ESOPHAGOGASTRODUODENOSCOPY (EGD) WITH PROPOFOL;  Surgeon: Pasty Spillers, MD;  Location: [facility] ENDOSCOPY;  Service: Endoscopy;  Laterality: N/A;   NO PAST SURGERIES     TIBIA IM NAIL INSERTION Right 04/09/2016   Procedure: INTRAMEDULLARY (IM) NAIL TIBIAL;  Surgeon: Christena Flake, MD;  Location: [facility] ORS;  Service: Orthopedics;  Laterality: Right;   Patient Active Problem List   Diagnosis Date Noted   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 Authorizing Provider  acetaminophen (TYLENOL) 325 MG tablet Take 650 mg by mouth every 6 (six) hours as needed.    [provider]  diphenhydrAMINE (BENADRYL) 25 MG tablet Take 25 mg by mouth at bedtime.    [provider]  ipratropium-albuterol (DUONEB) 0.5-2.5 (3) MG/3ML SOLN Take 3 mLs by nebulization every 4 (four) hours as needed. PRN wheezing, SOB    [provider]  levETIRAcetam (KEPPRA) 750 MG tablet Take 1,500 mg by mouth 2 (two) times daily.    [provider]  loratadine (CLARITIN) 10 MG tablet Take 10 mg by mouth daily.    [provider]  Multiple Vitamin (MULTIVITAMIN WITH MINERALS) TABS tablet Take 1 tablet by mouth daily.    [provider]  omeprazole (PRILOSEC) 20 MG capsule Take 1 capsule (20 mg total) by mouth daily. 10/07/19 03/18/20  Arrien, York Ram, MD  phenobarbital (LUMINAL) 16.2 MG tablet Take 2 tablets (32.4 mg total) by mouth 3 (three) times daily. START EVENING of 08/16/20 08/16/20   Lanae Boast, MD  phenytoin (DILANTIN) 100 MG ER capsule Take 400 mg by mouth at bedtime.     [provider]  phenytoin (DILANTIN) 100 MG ER capsule Take 200 mg by mouth daily.    [provider]  Probiotic Product (ACIDOPHILUS) CHEW Chew 1 tablet by mouth in the morning, at noon, and at bedtime. 07/17/20   [provider]  SPS 15 GM/60ML suspension Take 60 mLs by  mouth every Wednesday. 06/16/19   [provider]  VIMPAT 50 MG TABS tablet Take 50 mg by mouth 2 (two) times daily. 08/19/20   [provider]    Allergies Patient has no known allergies.    Social History Social History   Tobacco Use   Smoking status: Never   Smokeless tobacco: Never  Substance Use Topics   Alcohol use: No   Drug use: No    Review of Systems Patient denies headaches, rhinorrhea, blurry vision, numbness, shortness of  breath, chest pain, edema, cough, abdominal pain, nausea, vomiting, diarrhea, dysuria, fevers, rashes or hallucinations unless otherwise stated above in HPI. ____________________________________________   PHYSICAL EXAM:  VITAL SIGNS: Vitals:   10/01/20 1204  BP: (!) 127/55  Pulse: 96  Temp: 97.9 F (36.6 C)  SpO2: 99%    Constitutional: Alert, chronically ill appearing Eyes: Conjunctivae are normal.  Head: Atraumatic. Nose: No congestion/rhinnorhea. Mouth/Throat: Mucous membranes are moist.   Neck: No stridor. Painless ROM.  Cardiovascular: Normal rate, regular rhythm. Grossly normal heart sounds.  Good peripheral circulation. Respiratory: Normal respiratory effort.  No retractions. Lungs CTAB. Gastrointestinal: Soft and nontender. No distention. No abdominal bruits Genitourinary:  Musculoskeletal: No lower extremity tenderness nor edema.  No joint effusions. Neurologic:   No gross focal neurologic deficits are appreciated. No facial droop Skin:  Skin is warm, dry and intact. No rash noted. Psychiatric: calm and cooperative  ____________________________________________   LABS (all labs ordered are listed, but only abnormal results are displayed)  Results for orders placed or performed during the hospital encounter of 10/01/20 (from the past 24 hour(s))  CBC with Differential     Status: Abnormal   Collection Time: 10/01/20 12:12 PM  Result Value Ref Range   WBC 6.4 4.0 - 10.5 K/uL   RBC 3.44 (L) 3.87 - 5.11 MIL/uL   Hemoglobin 10.0 (L) 12.0 - 15.0 g/dL   HCT 49.4 (L) 49.6 - 75.9 %   MCV 90.1 80.0 - 100.0 fL   MCH 29.1 26.0 - 34.0 pg   MCHC 32.3 30.0 - 36.0 g/dL   RDW 16.3 84.6 - 65.9 %   Platelets 200 150 - 400 K/uL   nRBC 0.0 0.0 - 0.2 %   Neutrophils Relative % 53 %   Neutro Abs 3.4 1.7 - 7.7 K/uL   Lymphocytes Relative 35 %   Lymphs Abs 2.2 0.7 - 4.0 K/uL   Monocytes Relative 6 %   Monocytes Absolute 0.4 0.1 - 1.0 K/uL   Eosinophils Relative 4 %    Eosinophils Absolute 0.3 0.0 - 0.5 K/uL   Basophils Relative 1 %   Basophils Absolute 0.1 0.0 - 0.1 K/uL   Immature Granulocytes 1 %   Abs Immature Granulocytes 0.04 0.00 - 0.07 K/uL  Basic metabolic panel     Status: Abnormal   Collection Time: 10/01/20 12:12 PM  Result Value Ref Range   Sodium 137 135 - 145 mmol/L   Potassium 4.1 3.5 - 5.1 mmol/L   Chloride 106 98 - 111 mmol/L   CO2 24 22 - 32 mmol/L   Glucose, Bld 119 (H) 70 - 99 mg/dL   BUN 7 (L) 8 - 23 mg/dL   Creatinine, Ser 9.35 0.44 - 1.00 mg/dL   Calcium 9.0 8.9 - 70.1 mg/dL   GFR, Estimated >77 >93 mL/min   Anion gap 7 5 - 15  CBG monitoring, ED     Status: Abnormal   Collection Time: 10/01/20 12:51 PM  Result Value  Ref Range   Glucose-Capillary 113 (H) 70 - 99 mg/dL   ____________________________________________  EKG My review and personal interpretation at Time: 12:00   Indication: seizure  Rate: 95  Rhythm: sinus Axis: normal Other: normal intervals, no stemi ____________________________________________    ____________________________________________   PROCEDURES  Procedure(s) performed:  Procedures    Critical Care performed: no ____________________________________________   INITIAL IMPRESSION / ASSESSMENT AND PLAN / ED COURSE  Pertinent labs & imaging results that were available during my care of the patient were reviewed by me and considered in my medical decision making (see chart for details).   DDX: Seizure, noncompliance, electrolyte [place], hypoglycemia, dysrhythmia  Mariah Jimenez is a 75 y.o. who presents to the ED with presentation as described above.  Patient well-known and well-documented seizure disorder.  She is now at her baseline.  Do not feel that CT imaging clinically indicated at this time.  Blood work is reassuring.  No sign of dysrhythmia.  Suspect missed dose of AED yesterday likely precipitated today's event.  Given IV Keppra as well as her phenobarb.  Observed in the ER  without any evidence of status epilepticus.  She is been behaving at her baseline according to family.  Does appear stable appropriate for discharge back to facility with outpatient neurology follow-up.     The patient was evaluated in Emergency Department today for the symptoms described in the history of present illness. He/she was evaluated in the context of the global COVID-19 pandemic, which necessitated consideration that the patient might be at risk for infection with the SARS-CoV-2 virus that causes COVID-19. Institutional protocols and algorithms that pertain to the evaluation of patients at risk for COVID-19 are in a state of rapid change based on information released by regulatory bodies including the CDC and federal and state organizations. These policies and algorithms were followed during the patient's care in the ED.  As part of my medical decision making, I reviewed the following data within the electronic MEDICAL RECORD NUMBER Nursing notes reviewed and incorporated, Labs reviewed, notes from prior ED visits and [place] Controlled Substance Database   ____________________________________________   FINAL CLINICAL IMPRESSION(S) / ED DIAGNOSES  Final diagnoses:  Seizure (HCC)      NEW MEDICATIONS STARTED DURING THIS VISIT:  New Prescriptions   No medications on file     Note:  This document was prepared using Dragon voice recognition software and may include unintentional dictation errors.    Willy Eddy, MD 10/01/20 1320

## 2020-10-01 NOTE — Discharge Instructions (Addendum)
Please make sure that you are taking her prescribed medications catheter directed times is missing a dose will increase her risk of having breakthrough seizures.  Please follow-up with neurology.

## 2020-10-02 DIAGNOSIS — G4089 Other seizures: Secondary | ICD-10-CM | POA: Diagnosis not present

## 2020-10-06 DIAGNOSIS — G40909 Epilepsy, unspecified, not intractable, without status epilepticus: Secondary | ICD-10-CM | POA: Diagnosis not present

## 2020-10-07 DIAGNOSIS — G40909 Epilepsy, unspecified, not intractable, without status epilepticus: Secondary | ICD-10-CM | POA: Diagnosis not present

## 2020-10-17 ENCOUNTER — Other Ambulatory Visit: Payer: Self-pay

## 2020-10-17 ENCOUNTER — Non-Acute Institutional Stay: Payer: Medicare Other | Admitting: Primary Care

## 2020-10-17 DIAGNOSIS — F79 Unspecified intellectual disabilities: Secondary | ICD-10-CM | POA: Diagnosis not present

## 2020-10-17 DIAGNOSIS — Z515 Encounter for palliative care: Secondary | ICD-10-CM

## 2020-10-17 DIAGNOSIS — R569 Unspecified convulsions: Secondary | ICD-10-CM | POA: Diagnosis not present

## 2020-10-17 NOTE — Progress Notes (Signed)
[facility] Pall[facility] Consult Note Telephone: [phone]  Fax: [phone]    Date of encounter: 10/17/20 PATIENT NAME: Mariah Jimenez [place] [id number]   [phone] (home)  DOB: October 08, 1945 MRN: [id number] PRIMARY CARE PROVIDER:    Alvester Morin, MD,  [address] [place] [address] [phone]  REFERRING PROVIDER:   Alvester Morin, MD [address] [place],  [place] [address] [phone]  RESPONSIBLE PARTY:    Contact Information     Name Relation Home Work Mobile   [place] Sister   [phone]        I met face to face with [facility] in [facility]. Palliative Care was asked to follow this patient by consultation request of  Slade-Hartman, Ivette Loyal* to address advance care planning and complex medical decision making. This is a follow up visit.                                   ASSESSMENT AND PLAN / RECOMMENDATIONS:   Advance Care Planning/Goals of Care: Goals include to maximize quality of life and symptom management.  CODE STATUS: DNR  Symptom Management/Plan:  I met with patient in her nursing home room. She has had some seizure activity and recent trips to emergency. Today she is alert and conversant at her baseline. Staff do not report recent seizure activity and seems to be being managed now on her current protocol.  Her family has recommended morning dosing for seizure prevention as her seizures have been plaguing her since childhood. She was not able to eat her snack today as she does require meals set up. I would recommend set up for meals as well as snacks.    Follow up Palliative Care Visit: Palliative care will continue to follow for complex medical decision making, advance care planning, and clarification of goals. Return 6 weeks or prn.  I spent 25 minutes providing this consultation. More than 50% of the time in this consultation was spent in  counseling and care coordination.  PPS: 30%  HOSPICE ELIGIBILITY/DIAGNOSIS: TBD  Chief Complaint: seizures recent   HISTORY OF PRESENT ILLNESS:  Sharri Loya is a 75 y.o. year old female  with h/o epilepsy with recent seizures, intellectual disability, debility, immobility. .   History obtained from review of EMR, discussion with primary team, and interview with family, facility staff/caregiver and/or Ms. Rester.  I reviewed available labs, medications, imaging, studies and related documents from the EMR.  Records reviewed and summarized above.   ROS/staff   General NAD Pulmonary: denies cough,  Abdomen: endorses good appetite, denies constipation, endorses incontinence of bowel GU: denies dysuria, endorses incontinence of urine MSK:  denies weakness,  no falls reported Skin: denies rashes or wounds Neurological: denies pain, denies insomnia, denies recent seizure ( past few days) Psych: Endorses positive mood Heme/lymph/immuno: denies bruises, abnormal bleeding  Physical Exam: Current and past weights: 266 lbs, 20 lb weight loss in 3 mos (5%) Constitutional: NAD General: frail appearing, obese EYES: anicteric sclera, lids intact, no discharge  ENMT: intact hearing, oral mucous membranes moist, protruding lower lip, edentulous CV: S1S2, no LE edema Pulmonary: LCTA, no increased work of breathing, no cough, room air Abdomen: intake 75-100%, normo-active BS + 4 quadrants, soft and non tender, no ascites GU: deferred MSK: + sarcopenia, moves all extremities, non ambulatory Skin: warm and dry, no rashes or wounds on visible skin Neuro:  +  generalized weakness,  severe cognitive impairment Psych: non-anxious affect, A and O x 1 Hem/lymph/immuno: no widespread bruising  Outpatient Encounter Medications as of 10/17/2020  Medication Sig   acetaminophen (TYLENOL) 325 MG tablet Take 650 mg by mouth every 6 (six) hours as needed.   diazepam (VALIUM) 5 MG tablet Take 5 mg by mouth 3  (three) times daily as needed. For signs or symptoms of pending seizure activity   diazepam (VALIUM) 5 MG tablet Take 2.5 mg by mouth 2 (two) times daily.   diphenhydrAMINE (BENADRYL) 25 MG tablet Take 25 mg by mouth at bedtime.   ipratropium-albuterol (DUONEB) 0.5-2.5 (3) MG/3ML SOLN Take 3 mLs by nebulization every 4 (four) hours as needed. PRN wheezing, SOB   lamoTRIgine (LAMICTAL) 25 MG tablet Take 25 mg by mouth daily.   levETIRAcetam (KEPPRA) 750 MG tablet Take 1,500 mg by mouth 2 (two) times daily.   loratadine (CLARITIN) 10 MG tablet Take 10 mg by mouth daily.   LORazepam (ATIVAN) 2 MG/ML injection Inject 1 mg into the muscle as needed. Repeat in 30 minutes as needed for seizure activity   Multiple Vitamin (MULTIVITAMIN WITH MINERALS) TABS tablet Take 1 tablet by mouth daily.   omeprazole (PRILOSEC) 20 MG capsule Take 1 capsule (20 mg total) by mouth daily.   phenobarbital (LUMINAL) 16.2 MG tablet Take 2 tablets (32.4 mg total) by mouth 3 (three) times daily. START EVENING of 08/16/20 (Patient taking differently: Take 32.4 mg by mouth 3 (three) times daily. For epilespsy)   phenytoin (DILANTIN) 100 MG ER capsule Take 200 mg by mouth daily. For epilepsy   Probiotic Product (ACIDOPHILUS) CHEW Chew 1 tablet by mouth in the morning, at noon, and at bedtime.   SPS 15 GM/60ML suspension Take 60 mLs by mouth every Wednesday.   VIMPAT 50 MG TABS tablet Take 50 mg by mouth 2 (two) times daily.   phenytoin (DILANTIN) 100 MG ER capsule Take 400 mg by mouth at bedtime. For epilepsy (Patient not taking: Reported on 10/17/2020)   No facility-administered encounter medications on file as of 10/17/2020.     Thank you for the opportunity to participate in the care of Mariah Jimenez.  The palliative care team will continue to follow. Please call our office at [phone] if we can be of additional assistance.   Jason Coop, NP   COVID-19 PATIENT SCREENING TOOL Asked and negative response unless  otherwise noted:   Have you had symptoms of covid, tested positive or been in contact with someone with symptoms/positive test in the past 5-10 days?

## 2020-10-21 DIAGNOSIS — I10 Essential (primary) hypertension: Secondary | ICD-10-CM | POA: Diagnosis not present

## 2020-10-21 DIAGNOSIS — G4089 Other seizures: Secondary | ICD-10-CM | POA: Diagnosis not present

## 2020-11-03 DIAGNOSIS — G40909 Epilepsy, unspecified, not intractable, without status epilepticus: Secondary | ICD-10-CM | POA: Diagnosis not present

## 2020-11-07 DIAGNOSIS — G40909 Epilepsy, unspecified, not intractable, without status epilepticus: Secondary | ICD-10-CM | POA: Diagnosis not present

## 2020-11-12 ENCOUNTER — Other Ambulatory Visit: Payer: Self-pay

## 2020-11-12 ENCOUNTER — Emergency Department: Payer: Medicare Other

## 2020-11-12 ENCOUNTER — Inpatient Hospital Stay
Admission: EM | Admit: 2020-11-12 | Discharge: 2020-11-24 | DRG: 101 | Disposition: A | Discharge status: Skilled Nursing Facility | Payer: Medicare Other | Source: Skilled Nursing Facility | Attending: Internal Medicine | Admitting: Internal Medicine

## 2020-11-12 DIAGNOSIS — Z79899 Other long term (current) drug therapy: Secondary | ICD-10-CM | POA: Diagnosis not present

## 2020-11-12 DIAGNOSIS — N10 Acute tubulo-interstitial nephritis: Secondary | ICD-10-CM | POA: Diagnosis present

## 2020-11-12 DIAGNOSIS — Z20822 Contact with and (suspected) exposure to covid-19: Secondary | ICD-10-CM | POA: Diagnosis present

## 2020-11-12 DIAGNOSIS — R569 Unspecified convulsions: Secondary | ICD-10-CM | POA: Diagnosis not present

## 2020-11-12 DIAGNOSIS — R0902 Hypoxemia: Secondary | ICD-10-CM | POA: Diagnosis not present

## 2020-11-12 DIAGNOSIS — G809 Cerebral palsy, unspecified: Secondary | ICD-10-CM | POA: Diagnosis not present

## 2020-11-12 DIAGNOSIS — I517 Cardiomegaly: Secondary | ICD-10-CM | POA: Diagnosis not present

## 2020-11-12 DIAGNOSIS — N1330 Unspecified hydronephrosis: Secondary | ICD-10-CM | POA: Diagnosis not present

## 2020-11-12 DIAGNOSIS — G40909 Epilepsy, unspecified, not intractable, without status epilepticus: Secondary | ICD-10-CM | POA: Diagnosis not present

## 2020-11-12 DIAGNOSIS — B952 Enterococcus as the cause of diseases classified elsewhere: Secondary | ICD-10-CM | POA: Diagnosis not present

## 2020-11-12 DIAGNOSIS — R339 Retention of urine, unspecified: Secondary | ICD-10-CM | POA: Diagnosis not present

## 2020-11-12 DIAGNOSIS — E279 Disorder of adrenal gland, unspecified: Secondary | ICD-10-CM | POA: Diagnosis present

## 2020-11-12 DIAGNOSIS — Z6841 Body Mass Index (BMI) 40.0 and over, adult: Secondary | ICD-10-CM

## 2020-11-12 DIAGNOSIS — I959 Hypotension, unspecified: Secondary | ICD-10-CM | POA: Diagnosis not present

## 2020-11-12 DIAGNOSIS — N12 Tubulo-interstitial nephritis, not specified as acute or chronic: Secondary | ICD-10-CM

## 2020-11-12 DIAGNOSIS — F79 Unspecified intellectual disabilities: Secondary | ICD-10-CM | POA: Diagnosis not present

## 2020-11-12 DIAGNOSIS — E559 Vitamin D deficiency, unspecified: Secondary | ICD-10-CM | POA: Diagnosis not present

## 2020-11-12 DIAGNOSIS — I10 Essential (primary) hypertension: Secondary | ICD-10-CM | POA: Diagnosis present

## 2020-11-12 DIAGNOSIS — K5900 Constipation, unspecified: Secondary | ICD-10-CM | POA: Diagnosis present

## 2020-11-12 DIAGNOSIS — Z66 Do not resuscitate: Secondary | ICD-10-CM | POA: Diagnosis not present

## 2020-11-12 DIAGNOSIS — R338 Other retention of urine: Secondary | ICD-10-CM | POA: Diagnosis present

## 2020-11-12 DIAGNOSIS — K6389 Other specified diseases of intestine: Secondary | ICD-10-CM | POA: Diagnosis not present

## 2020-11-12 DIAGNOSIS — R1110 Vomiting, unspecified: Secondary | ICD-10-CM | POA: Diagnosis not present

## 2020-11-12 DIAGNOSIS — N3289 Other specified disorders of bladder: Secondary | ICD-10-CM | POA: Diagnosis not present

## 2020-11-12 DIAGNOSIS — R42 Dizziness and giddiness: Secondary | ICD-10-CM | POA: Diagnosis not present

## 2020-11-12 DIAGNOSIS — F88 Other disorders of psychological development: Secondary | ICD-10-CM | POA: Diagnosis not present

## 2020-11-12 DIAGNOSIS — N281 Cyst of kidney, acquired: Secondary | ICD-10-CM | POA: Diagnosis not present

## 2020-11-12 LAB — COMPREHENSIVE METABOLIC PANEL
ALT: 23 U/L (ref 0–44)
AST: 26 U/L (ref 15–41)
Albumin: 3 g/dL — ABNORMAL LOW (ref 3.5–5.0)
Alkaline Phosphatase: 215 U/L — ABNORMAL HIGH (ref 38–126)
Anion gap: 11 (ref 5–15)
BUN: 10 mg/dL (ref 8–23)
CO2: 25 mmol/L (ref 22–32)
Calcium: 8.9 mg/dL (ref 8.9–10.3)
Chloride: 102 mmol/L (ref 98–111)
Creatinine, Ser: 0.66 mg/dL (ref 0.44–1.00)
GFR, Estimated: >60 mL/min (ref >60)
Glucose, Bld: 186 mg/dL — ABNORMAL HIGH (ref 70–99)
Potassium: 3.2 mmol/L — ABNORMAL LOW (ref 3.5–5.1)
Sodium: 138 mmol/L (ref 135–145)
Total Bilirubin: 0.5 mg/dL (ref 0.3–1.2)
Total Protein: 7.6 g/dL (ref 6.5–8.1)

## 2020-11-12 LAB — CBC WITH DIFFERENTIAL/PLATELET
Abs Immature Granulocytes: 0.09 10*3/uL — ABNORMAL HIGH (ref 0.00–0.07)
Basophils Absolute: 0 10*3/uL (ref 0.0–0.1)
Basophils Relative: 0 %
Eosinophils Absolute: 0.1 10*3/uL (ref 0.0–0.5)
Eosinophils Relative: 1 %
HCT: 31.4 % — ABNORMAL LOW (ref 36.0–46.0)
Hemoglobin: 10.3 g/dL — ABNORMAL LOW (ref 12.0–15.0)
Immature Granulocytes: 1 %
Lymphocytes Relative: 21 %
Lymphs Abs: 2.7 10*3/uL (ref 0.7–4.0)
MCH: 29.9 pg (ref 26.0–34.0)
MCHC: 32.8 g/dL (ref 30.0–36.0)
MCV: 91 fL (ref 80.0–100.0)
Monocytes Absolute: 0.5 10*3/uL (ref 0.1–1.0)
Monocytes Relative: 4 %
Neutro Abs: 9.1 10*3/uL — ABNORMAL HIGH (ref 1.7–7.7)
Neutrophils Relative %: 73 %
Platelets: 256 10*3/uL (ref 150–400)
RBC: 3.45 MIL/uL — ABNORMAL LOW (ref 3.87–5.11)
RDW: 15.1 % (ref 11.5–15.5)
WBC: 12.5 10*3/uL — ABNORMAL HIGH (ref 4.0–10.5)
nRBC: 0 % (ref 0.0–0.2)

## 2020-11-12 LAB — TROPONIN I (HIGH SENSITIVITY): Troponin I (High Sensitivity): 15 ng/L (ref <18)

## 2020-11-12 MED ORDER — IOHEXOL 300 MG/ML  SOLN
125.0000 mL | Freq: Once | INTRAMUSCULAR | Status: AC | PRN
  Administered 2020-11-12: 125 mL via INTRAVENOUS

## 2020-11-12 MED ORDER — ONDANSETRON HCL 4 MG/2ML IJ SOLN
4.0000 mg | Freq: Once | INTRAMUSCULAR | Status: AC
  Administered 2020-11-12: 4 mg via INTRAVENOUS
  Filled 2020-11-12: qty 2

## 2020-11-12 MED ORDER — PHENOBARBITAL 32.4 MG PO TABS
32.4000 mg | ORAL_TABLET | Freq: Three times a day (TID) | ORAL | Status: DC
  Administered 2020-11-12 – 2020-11-24 (×35): 32.4 mg via ORAL
  Filled 2020-11-12 (×38): qty 1

## 2020-11-12 MED ORDER — PHENYTOIN SODIUM EXTENDED 100 MG PO CAPS
400.0000 mg | ORAL_CAPSULE | Freq: Every day | ORAL | Status: DC
  Administered 2020-11-12 – 2020-11-13 (×2): 400 mg via ORAL
  Filled 2020-11-12 (×3): qty 4

## 2020-11-12 MED ORDER — LEVETIRACETAM IN NACL 1500 MG/100ML IV SOLN
1500.0000 mg | Freq: Two times a day (BID) | INTRAVENOUS | Status: DC
  Administered 2020-11-12 – 2020-11-16 (×9): 1500 mg via INTRAVENOUS
  Filled 2020-11-12 (×12): qty 100

## 2020-11-12 MED ORDER — SODIUM CHLORIDE 0.9 % IV BOLUS
1000.0000 mL | Freq: Once | INTRAVENOUS | Status: AC
  Administered 2020-11-12: 1000 mL via INTRAVENOUS

## 2020-11-12 MED ORDER — LAMOTRIGINE 25 MG PO TABS
25.0000 mg | ORAL_TABLET | Freq: Two times a day (BID) | ORAL | Status: DC
  Administered 2020-11-12 – 2020-11-24 (×23): 25 mg via ORAL
  Filled 2020-11-12 (×27): qty 1

## 2020-11-12 MED ORDER — ACETAMINOPHEN 500 MG PO TABS
1000.0000 mg | ORAL_TABLET | Freq: Once | ORAL | Status: AC
  Administered 2020-11-12: 1000 mg via ORAL
  Filled 2020-11-12: qty 2

## 2020-11-12 NOTE — ED Provider Notes (Signed)
[facility] Emergency Department Provider Note  ____________________________________________   Event Date/Time   First MD Initiated Contact with Patient 11/12/20 2006     (approximate)  I have reviewed the triage vital signs and the nursing notes.   HISTORY  Chief Complaint Seizures    HPI Deeksha Cotrell is a 75 y.o. female MR and seizure who comes in for seizures.  Patient comes in from [facility] care.  Per EMS facility stated that patient had seizure that was GTC at [facility] and patient was given 1 mg of IM Ativan by facility staff. It resolved and was unresponsive and given another 1mg   at 1856 but another seizure. Pt was in her bed. Did not think she had any fall.  Patient has not had any other additional seizures since.  Patient has baseline cognitive issues.  Initially concern for some potential hypotension with EMS. Got 500 cc fluid.   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   Gastric polyp    Class 3 obesity 10/07/2019   Sinus tachycardia 10/05/2019   Pressure injury of skin 10/05/2019   Dehydration 10/05/2019   Intractable nausea and vomiting 10/04/2019   Intellectual disability 10/04/2019   Elevated lipase 10/04/2019   Seizure disorder (HCC) 08/18/2019   UTI (urinary tract infection) 08/18/2019   Hypothermia 08/18/2019   Acute respiratory failure with hypoxia (HCC) 08/18/2019   Palliative care encounter 10/06/2018   Debility 10/06/2018   Tibia/fibula fracture 04/09/2016    Past Surgical History:  Procedure Laterality Date   ESOPHAGOGASTRODUODENOSCOPY (EGD) WITH PROPOFOL N/A 10/10/2019   Procedure: ESOPHAGOGASTRODUODENOSCOPY (EGD) WITH PROPOFOL;  Surgeon: 10/12/2019, MD;  Location: [facility] ENDOSCOPY;  Service: Endoscopy;  Laterality:  N/A;   NO PAST SURGERIES     TIBIA IM NAIL INSERTION Right 04/09/2016   Procedure: INTRAMEDULLARY (IM) NAIL TIBIAL;  Surgeon: 04/11/2016, MD;  Location: [facility] ORS;  Service: Orthopedics;  Laterality: Right;    Prior to Admission medications   Medication Sig Start Date End Date Taking? Authorizing Provider  acetaminophen (TYLENOL) 325 MG tablet Take 650 mg by mouth every 6 (six) hours as needed.    [provider]  diazepam (VALIUM) 5 MG tablet Take 5 mg by mouth 3 (three) times daily as needed. For signs or symptoms of pending seizure activity    [provider]  diazepam (VALIUM) 5 MG tablet Take 2.5 mg by mouth 2 (two) times daily.    [provider]  diphenhydrAMINE (BENADRYL) 25 MG tablet Take 25 mg by mouth at bedtime.    [provider]  ipratropium-albuterol (DUONEB) 0.5-2.5 (3) MG/3ML SOLN Take 3 mLs by nebulization every 4 (four) hours as needed. PRN wheezing, SOB    [provider]  lamoTRIgine (LAMICTAL) 25 MG tablet Take 25 mg by mouth daily.    [provider]  levETIRAcetam (KEPPRA) 750 MG tablet Take 1,500 mg by mouth 2 (two) times daily.    [provider]  loratadine (CLARITIN) 10 MG tablet Take 10 mg by mouth daily.    [provider]  LORazepam (ATIVAN) 2 MG/ML injection Inject 1 mg into the muscle as needed. Repeat in 30 minutes as needed for seizure activity 08/13/20   [provider]  Multiple Vitamin (MULTIVITAMIN WITH MINERALS) TABS tablet Take 1 tablet by mouth daily.    [provider]  omeprazole (PRILOSEC) 20 MG capsule Take 1 capsule (20 mg total) by mouth daily. 10/07/19 11/15/20  Arrien, York RamMauricio Daniel, MD  phenobarbital (LUMINAL) 16.2 MG tablet Take 2 tablets (32.4 mg total) by mouth 3 (three) times daily. START EVENING of 08/16/20 Patient taking differently: Take 32.4 mg by mouth 3 (three) times daily. For epilespsy 08/16/20   Lanae BoastKc, Ramesh, MD  phenytoin (DILANTIN) 100 MG ER capsule  Take 400 mg by mouth at bedtime. For epilepsy Patient not taking: Reported on 10/17/2020    [provider]  phenytoin (DILANTIN) 100 MG ER capsule Take 200 mg by mouth daily. For epilepsy    [provider]  Probiotic Product (ACIDOPHILUS) CHEW Chew 1 tablet by mouth in the morning, at noon, and at bedtime. 07/17/20   [provider]  SPS 15 GM/60ML suspension Take 60 mLs by mouth every Wednesday. 06/16/19   [provider]  VIMPAT 50 MG TABS tablet Take 50 mg by mouth 2 (two) times daily. 08/19/20   [provider]    Allergies Patient has no known allergies.  Family History  Family history unknown: Yes    Social History Social History   Tobacco Use   Smoking status: Never   Smokeless tobacco: Never  Substance Use Topics   Alcohol use: No   Drug use: No      Review of Systems Unable to get full rose due to MR  ____________________________________________   PHYSICAL EXAM:  VITAL SIGNS: ED Triage Vitals  Enc Vitals Group     BP 11/12/20 2018 (!) 112/56     Pulse Rate 11/12/20 2018 (!) 104     Resp 11/12/20 2018 20     Temp 11/12/20 2018 (!) 97.5 F (36.4 C)     Temp Source 11/12/20 2018 Axillary     SpO2 11/12/20 2018 95 %     Weight 11/12/20 2009 287 lb (130.2 kg)     Height 11/12/20 2009 5\' 8"  (1.727 m)     Head Circumference --      Peak Flow --      Pain Score --      Pain Loc --      Pain Edu? --      Excl. in GC? --     Constitutional: alert looking around room, moaning  Eyes: Conjunctivae are normal. EOMI. Head: Atraumatic. Nose: No congestion/rhinnorhea. Mouth/Throat: Mucous membranes are moist.   Neck: No stridor. Trachea Midline. FROM Cardiovascular: Tachycardic, regular rhythm. Grossly normal heart sounds.  Good peripheral circulation. Respiratory: Normal respiratory effort.  No retractions. Lungs CTAB. Gastrointestinal: Soft but distended.  No distention. No abdominal bruits.  Musculoskeletal: No lower  extremity tenderness nor edema.  No joint effusions. Neurologic:  Normal speech and language. No gross focal neurologic deficits are appreciated.  Skin:  Skin is warm, dry and intact. No rash noted. Psychiatric: Mood and affect are normal. Speech and behavior are normal. GU: Deferred   ____________________________________________   LABS (all labs ordered are listed, but only abnormal results are displayed)  Labs Reviewed  COMPREHENSIVE METABOLIC PANEL - Abnormal; Notable for the following components:      Result Value   Potassium 3.2 (*)    Glucose, Bld 186 (*)    Albumin 3.0 (*)    Alkaline Phosphatase 215 (*)    All other components within normal limits  CBC WITH DIFFERENTIAL/PLATELET  PHENOBARBITAL LEVEL  PHENYTOIN LEVEL, FREE AND TOTAL  LEVETIRACETAM LEVEL   ____________________________________________   ED ECG REPORT I, Concha Se, the attending physician, personally viewed and interpreted this ECG.  Normal sinus rhythm 92, no ST elevation, no T wave inversions, normal intervals ____________________________________________  RADIOLOGY Vela Prose, personally viewed and evaluated these images (plain radiographs) as part of my medical decision making, as well as reviewing the written report by the radiologist.  ED MD interpretation: No pneumonia or aspiration   Official radiology report(s): DG Chest Portable 1 View  Result Date: 11/12/2020 CLINICAL DATA:  Vomiting and seizure. EXAM: PORTABLE CHEST 1 VIEW COMPARISON:  Chest radiograph dated 08/12/2020. FINDINGS: Shallow inspiration. There is cardiomegaly with vascular congestion and possible mild edema. No focal consolidation, pleural effusion pneumothorax. Atherosclerotic calcification of the aorta. No acute osseous pathology. IMPRESSION: Cardiomegaly with vascular congestion.  No focal consolidation. Electronically Signed   By: Elgie Collard M.D.   On: 11/12/2020 22:01     ____________________________________________   PROCEDURES  Procedure(s) performed (including Critical Care):  .1-3 Lead EKG Interpretation  Date/Time: 11/12/2020 9:35 PM Performed by: Concha Se, MD Authorized by: Concha Se, MD     Interpretation: normal     ECG rate:  90s   Rhythm: sinus rhythm     Ectopy: none     Conduction: normal   Comments:     Initially tachycardic but came down after IV fluids to normal sinus.   ____________________________________________   INITIAL IMPRESSION / ASSESSMENT AND PLAN / ED COURSE  Mariah Jimenez was evaluated in Emergency Department on 11/12/2020 for the symptoms described in the history of present illness. She was evaluated in the context of the global COVID-19 pandemic, which necessitated consideration that the patient might be at risk for infection with the SARS-CoV-2 virus that causes COVID-19. Institutional protocols and algorithms that pertain to the evaluation of patients at risk for COVID-19 are in a state of rapid change based on information released by regulatory bodies including the CDC and federal and state organizations. These policies and algorithms were followed during the patient's care in the ED.    Patient comes in with concern for seizures.  Patient has a extensive history of seizures.  Patient was found in bed and does not sound like she hit her head.  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  Patient was told to follow-up outpatient with neurology.  Labs ordered to evaluate for Electra abnormalities, AKI, seizure medication levels.  We will give a dose of IV Keppra and have pharmacy look in to see what other home dose that she is due for her seizure medicine.  Patient's sister is in the room who states that she seems to be in pain.  She states this is not normal for her after seizures.  Patient did have an episode of vomiting in the hallway and had to be moved into a room.  IV Zofran was given.  She  states that she feels like her abdomen is more distended than normal.  Discussed with sister who wants to proceed with further work-up for this.  Will get CT abdomen to evaluate for any Obstruction given history is very limited due to her baseline MR.  We will also add on a CT head just given the vomiting to make sure numbness of intercranial hemorrhage.  We will add on chest x-ray to make sure no evidence of aspiration.  Patient was given some Tylenol to help with  pain.  10:40 PM discussed with pharmacy to get patient's nighttime seizure meds ordered.  Patient handed off to oncoming team pending CT scans, urine and reevaluation.        ____________________________________________   FINAL CLINICAL IMPRESSION(S) / ED DIAGNOSES   Final diagnoses:  Seizure (HCC)      MEDICATIONS GIVEN DURING THIS VISIT:  Medications  levETIRAcetam (KEPPRA) IVPB 1500 mg/ 100 mL premix (0 mg Intravenous Stopped 11/12/20 2225)  lamoTRIgine (LAMICTAL) tablet 25 mg (has no administration in time range)  PHENobarbital (LUMINAL) tablet 32.4 mg (has no administration in time range)  phenytoin (DILANTIN) ER capsule 400 mg (has no administration in time range)  sodium chloride 0.9 % bolus 1,000 mL (0 mLs Intravenous Stopped 11/12/20 2210)  ondansetron (ZOFRAN) injection 4 mg (4 mg Intravenous Given 11/12/20 2138)  acetaminophen (TYLENOL) tablet 1,000 mg (1,000 mg Oral Given 11/12/20 2138)  iohexol (OMNIPAQUE) 300 MG/ML solution 125 mL (125 mLs Intravenous Contrast Given 11/12/20 2319)     ED Discharge Orders     None        Note:  This document was prepared using Dragon voice recognition software and may include unintentional dictation errors.    Concha Se, MD 11/12/20 [phone]

## 2020-11-12 NOTE — ED Triage Notes (Signed)
Pt to ED via EMS from [facility], per ems facility stated [t had focal seizure around 1824 tonight, pt was given her prescribed 2mg  ativan IM by facility staff. Pt since has not had another seizure. Pt is at baseline cognition according to facility. Pt does have hx of seizures. Pts intial BP with EMS was 83/41, after 500cc NS with ems pts BP 106/52. Pt does not appear to be in any distress

## 2020-11-12 NOTE — ED Notes (Signed)
Pt provided with water. Okayed by Dr. Fuller Plan.

## 2020-11-13 ENCOUNTER — Encounter: Payer: Self-pay | Admitting: Internal Medicine

## 2020-11-13 DIAGNOSIS — Z79899 Other long term (current) drug therapy: Secondary | ICD-10-CM | POA: Diagnosis not present

## 2020-11-13 DIAGNOSIS — E279 Disorder of adrenal gland, unspecified: Secondary | ICD-10-CM | POA: Diagnosis not present

## 2020-11-13 DIAGNOSIS — R338 Other retention of urine: Secondary | ICD-10-CM | POA: Diagnosis present

## 2020-11-13 DIAGNOSIS — R569 Unspecified convulsions: Secondary | ICD-10-CM | POA: Insufficient documentation

## 2020-11-13 DIAGNOSIS — Z7401 Bed confinement status: Secondary | ICD-10-CM | POA: Diagnosis not present

## 2020-11-13 DIAGNOSIS — B952 Enterococcus as the cause of diseases classified elsewhere: Secondary | ICD-10-CM | POA: Diagnosis not present

## 2020-11-13 DIAGNOSIS — G809 Cerebral palsy, unspecified: Secondary | ICD-10-CM | POA: Diagnosis not present

## 2020-11-13 DIAGNOSIS — K5900 Constipation, unspecified: Secondary | ICD-10-CM | POA: Diagnosis not present

## 2020-11-13 DIAGNOSIS — Z6841 Body Mass Index (BMI) 40.0 and over, adult: Secondary | ICD-10-CM | POA: Diagnosis not present

## 2020-11-13 DIAGNOSIS — N10 Acute tubulo-interstitial nephritis: Secondary | ICD-10-CM

## 2020-11-13 DIAGNOSIS — I10 Essential (primary) hypertension: Secondary | ICD-10-CM | POA: Diagnosis not present

## 2020-11-13 DIAGNOSIS — Z20822 Contact with and (suspected) exposure to covid-19: Secondary | ICD-10-CM | POA: Diagnosis not present

## 2020-11-13 DIAGNOSIS — F88 Other disorders of psychological development: Secondary | ICD-10-CM | POA: Diagnosis not present

## 2020-11-13 DIAGNOSIS — G40909 Epilepsy, unspecified, not intractable, without status epilepticus: Secondary | ICD-10-CM

## 2020-11-13 DIAGNOSIS — I959 Hypotension, unspecified: Secondary | ICD-10-CM | POA: Diagnosis not present

## 2020-11-13 DIAGNOSIS — E559 Vitamin D deficiency, unspecified: Secondary | ICD-10-CM | POA: Diagnosis not present

## 2020-11-13 DIAGNOSIS — R404 Transient alteration of awareness: Secondary | ICD-10-CM | POA: Diagnosis not present

## 2020-11-13 DIAGNOSIS — R339 Retention of urine, unspecified: Secondary | ICD-10-CM | POA: Diagnosis not present

## 2020-11-13 DIAGNOSIS — N12 Tubulo-interstitial nephritis, not specified as acute or chronic: Secondary | ICD-10-CM | POA: Diagnosis not present

## 2020-11-13 DIAGNOSIS — Z66 Do not resuscitate: Secondary | ICD-10-CM | POA: Diagnosis not present

## 2020-11-13 DIAGNOSIS — F79 Unspecified intellectual disabilities: Secondary | ICD-10-CM | POA: Diagnosis not present

## 2020-11-13 LAB — URINALYSIS, COMPLETE (UACMP) WITH MICROSCOPIC
Bilirubin Urine: NEGATIVE
Glucose, UA: NEGATIVE mg/dL
Ketones, ur: NEGATIVE mg/dL
Nitrite: NEGATIVE
Protein, ur: 100 mg/dL — AB
Specific Gravity, Urine: 1.012 (ref 1.005–1.030)
pH: 6 (ref 5.0–8.0)

## 2020-11-13 LAB — CBC
HCT: 35.5 % — ABNORMAL LOW (ref 36.0–46.0)
Hemoglobin: 11.5 g/dL — ABNORMAL LOW (ref 12.0–15.0)
MCH: 29 pg (ref 26.0–34.0)
MCHC: 32.4 g/dL (ref 30.0–36.0)
MCV: 89.6 fL (ref 80.0–100.0)
Platelets: 267 10*3/uL (ref 150–400)
RBC: 3.96 MIL/uL (ref 3.87–5.11)
RDW: 15.2 % (ref 11.5–15.5)
WBC: 10.1 10*3/uL (ref 4.0–10.5)
nRBC: 0 % (ref 0.0–0.2)

## 2020-11-13 LAB — PHENOBARBITAL LEVEL: Phenobarbital: 23.6 ug/mL (ref 15.0–30.0)

## 2020-11-13 MED ORDER — IPRATROPIUM-ALBUTEROL 0.5-2.5 (3) MG/3ML IN SOLN: 3.0000 mL | RESPIRATORY_TRACT | Status: DC | PRN

## 2020-11-13 MED ORDER — ONDANSETRON HCL 4 MG/2ML IJ SOLN: 4.0000 mg | Freq: Four times a day (QID) | INTRAMUSCULAR | Status: DC | PRN

## 2020-11-13 MED ORDER — ONDANSETRON HCL 4 MG PO TABS: 4.0000 mg | ORAL_TABLET | Freq: Four times a day (QID) | ORAL | Status: DC | PRN

## 2020-11-13 MED ORDER — LACOSAMIDE 50 MG PO TABS
50.0000 mg | ORAL_TABLET | Freq: Two times a day (BID) | ORAL | Status: DC
  Administered 2020-11-13 – 2020-11-24 (×23): 50 mg via ORAL
  Filled 2020-11-13 (×23): qty 1

## 2020-11-13 MED ORDER — LORAZEPAM 2 MG/ML IJ SOLN
2.0000 mg | INTRAMUSCULAR | Status: DC | PRN
  Administered 2020-11-20: 09:00:00 2 mg via INTRAVENOUS
  Filled 2020-11-13: qty 1

## 2020-11-13 MED ORDER — LEVETIRACETAM 500 MG PO TABS: 1500.0000 mg | ORAL_TABLET | Freq: Two times a day (BID) | ORAL | Status: DC

## 2020-11-13 MED ORDER — POTASSIUM CHLORIDE IN NACL 40-0.9 MEQ/L-% IV SOLN
INTRAVENOUS | Status: AC
  Administered 2020-11-13: 50 mL/h via INTRAVENOUS
  Filled 2020-11-13: qty 1000

## 2020-11-13 MED ORDER — SODIUM POLYSTYRENE SULFONATE 15 GM/60ML PO SUSP: 15.0000 g | ORAL | Status: DC

## 2020-11-13 MED ORDER — LORATADINE 10 MG PO TABS
10.0000 mg | ORAL_TABLET | Freq: Every day | ORAL | Status: DC
  Administered 2020-11-13 – 2020-11-24 (×12): 10 mg via ORAL
  Filled 2020-11-13 (×12): qty 1

## 2020-11-13 MED ORDER — PHENYTOIN SODIUM EXTENDED 100 MG PO CAPS
200.0000 mg | ORAL_CAPSULE | Freq: Every day | ORAL | Status: DC
  Administered 2020-11-13 – 2020-11-15 (×3): 200 mg via ORAL
  Filled 2020-11-13 (×3): qty 2

## 2020-11-13 MED ORDER — PANTOPRAZOLE SODIUM 40 MG PO TBEC
40.0000 mg | DELAYED_RELEASE_TABLET | Freq: Every day | ORAL | Status: DC
  Administered 2020-11-13 – 2020-11-24 (×12): 40 mg via ORAL
  Filled 2020-11-13 (×12): qty 1

## 2020-11-13 MED ORDER — ADULT MULTIVITAMIN W/MINERALS CH
1.0000 | ORAL_TABLET | Freq: Every day | ORAL | Status: DC
  Administered 2020-11-13 – 2020-11-24 (×12): 1 via ORAL
  Filled 2020-11-13 (×11): qty 1

## 2020-11-13 MED ORDER — SODIUM CHLORIDE 0.9 % IV SOLN
1.0000 g | Freq: Once | INTRAVENOUS | Status: AC
  Administered 2020-11-13: 1 g via INTRAVENOUS
  Filled 2020-11-13: qty 10

## 2020-11-13 MED ORDER — DIAZEPAM 5 MG PO TABS: 2.5000 mg | ORAL_TABLET | Freq: Two times a day (BID) | ORAL | Status: DC

## 2020-11-13 MED ORDER — ACETAMINOPHEN 325 MG PO TABS
650.0000 mg | ORAL_TABLET | Freq: Four times a day (QID) | ORAL | Status: DC | PRN
  Administered 2020-11-14: 650 mg via ORAL
  Filled 2020-11-13: qty 2

## 2020-11-13 MED ORDER — CEFTRIAXONE SODIUM 1 G IJ SOLR
1.0000 g | INTRAMUSCULAR | Status: DC
  Administered 2020-11-14 – 2020-11-16 (×3): 1 g via INTRAVENOUS
  Filled 2020-11-13 (×3): qty 10

## 2020-11-13 MED ORDER — ENOXAPARIN SODIUM 40 MG/0.4ML IJ SOSY: 40.0000 mg | PREFILLED_SYRINGE | INTRAMUSCULAR | Status: DC

## 2020-11-13 MED ORDER — ORAL CARE MOUTH RINSE
15.0000 mL | OROMUCOSAL | Status: DC
  Administered 2020-11-13 – 2020-11-24 (×94): 15 mL via OROMUCOSAL
  Filled 2020-11-13 (×14): qty 15

## 2020-11-13 MED ORDER — ENOXAPARIN SODIUM 80 MG/0.8ML IJ SOSY
0.5000 mg/kg | PREFILLED_SYRINGE | INTRAMUSCULAR | Status: DC
  Administered 2020-11-13 – 2020-11-24 (×12): 65 mg via SUBCUTANEOUS
  Filled 2020-11-13 (×4): qty 0.65
  Filled 2020-11-13: qty 0.8
  Filled 2020-11-13 (×2): qty 0.65
  Filled 2020-11-13 (×4): qty 0.8
  Filled 2020-11-13: qty 0.65
  Filled 2020-11-13: qty 0.8

## 2020-11-13 MED ORDER — ACETAMINOPHEN 650 MG RE SUPP: 650.0000 mg | Freq: Four times a day (QID) | RECTAL | Status: DC | PRN

## 2020-11-13 NOTE — ED Notes (Signed)
Foley catheter placed by Dr. Don Perking. Urine was yellow with green chunky pieces of sediment. Foley became stopped up and was flushed.

## 2020-11-13 NOTE — ED Notes (Signed)
Lab at bedside to obtain morning blood work and states they were unsuccessful

## 2020-11-13 NOTE — H&P (Signed)
History and Physical    Mariah Jimenez LYY:[id number] DOB: 07/21/1945 DOA: 11/12/2020  PCP: Keane Police, MD   Patient coming from: Skilled nursing facility  I have personally briefly reviewed patient's old medical records in [facility]  Chief Complaint: Seizure Most of the history was obtained from the EMR as patient is unable to provide any history  HPI: Mariah Jimenez is a 75 y.o. female with medical history significant for mental retardation, morbid obesity and seizures who was brought into the ER from [facility] for evaluation of multiple seizures. Per nursing home staff patient had a focal seizure at around 6:24 PM and received a dose of IM Ativan 2 mg.  She was noted to be hypotensive by EMS with blood pressure of 83/41 and received a 500 cc fluid bolus prior to arriving to the emergency room. Patient's sister who was at the bedside was concerned about abdominal distention and patient was noted to have suprapubic tenderness with bladder distention.  Foley catheter was inserted with drainage of about 1 L of urine.  CT scan done in the ER was also consistent with urinary retention and pyelonephritis and patient received a dose of IV Rocephin. I am unable to do review of systems on this patient. Labs show white count 12.3 >>10.1, hemoglobin 11.5, hematocrit 35.5, MCV 89.6, RDW 15.2, platelet count 267, sodium 138, potassium 3.2, chloride 102, bicarb 25, glucose 196, BUN 10, creatinine 0.6, calcium 8.9, alkaline phosphatase 215, albumin 3.0, AST 26, ALT 23, total protein 7.6 Chest x-ray reviewed by me shows cardiomegaly with vascular congestion.  No focal consolidation. CT scan of the head without contrast shows no evidence of acute intracranial abnormality.  Chronic cerebral atrophy.  Left mastoid effusion. CT scan of abdomen and pelvis with contrast shows mild focal air distension of the sigmoid colon which is visible in the right hemiabdomen. Narrowed appearing  segments of colon upstream and downstream to the dilated segment with collapsed rectosigmoid colon, raising possibility of obstructed segment of sigmoid colon. There is no evidence for small bowel obstruction. Mild left hydronephrosis and hydroureter without obstructing stone. Mild urothelial enhancement on the left, question ascending urinary tract infection. There is marked bladder distension. Stable 2.7 cm left adrenal mass, possibly adenoma. Twelve-lead EKG reviewed by me shows sinus rhythm with low voltage QRS.    ED Course: Patient is a 75 year old female who resides in a skilled nursing facility and has an underlying history of mental retardation and seizure disorder.  She had a witnessed seizure at the facility and an episode of hypotension.  EMS was called and she received 500 cc fluid bolus prior to arriving the emergency room. Patient was noted to have acute urinary retention and a Foley catheter was placed in the ER with drainage of 1 L of urine. CT scan was also suggestive of possible UTI and patient received a dose of IV Rocephin. She will be admitted to the hospital for further evaluation.  Review of Systems: As per HPI otherwise all other systems reviewed and negative.    Past Medical History:  Diagnosis Date   Mental retardation    Seizures (HCC)     Past Surgical History:  Procedure Laterality Date   ESOPHAGOGASTRODUODENOSCOPY (EGD) WITH PROPOFOL N/A 10/10/2019   Procedure: ESOPHAGOGASTRODUODENOSCOPY (EGD) WITH PROPOFOL;  Surgeon: Pasty Spillers, MD;  Location: [facility] ENDOSCOPY;  Service: Endoscopy;  Laterality: N/A;   NO PAST SURGERIES     TIBIA IM NAIL INSERTION Right 04/09/2016   Procedure: INTRAMEDULLARY (  IM) NAIL TIBIAL;  Surgeon: Christena FlakeJohn J Poggi, MD;  Location: [facility] ORS;  Service: Orthopedics;  Laterality: Right;     reports that she has never smoked. She has never used smokeless tobacco. She reports that she does not drink alcohol and does not use drugs.  No  Known Allergies  Family History  Family history unknown: Yes  Patient is unable to provide any history   Prior to Admission medications   Medication Sig Start Date End Date Taking? Authorizing Provider  acetaminophen (TYLENOL) 325 MG tablet Take 650 mg by mouth every 6 (six) hours as needed.   Yes [provider]  cloNIDine (CATAPRES) 0.1 MG tablet Take 0.1 mg by mouth 4 (four) times daily as needed.   Yes [provider]  diazepam (VALIUM) 5 MG tablet Take 5 mg by mouth 3 (three) times daily as needed. For signs or symptoms of pending seizure activity   Yes [provider]  diazepam (VALIUM) 5 MG tablet Take 2.5 mg by mouth 2 (two) times daily.   Yes [provider]  diphenhydrAMINE (BENADRYL) 25 MG tablet Take 25 mg by mouth at bedtime.   Yes [provider]  ipratropium-albuterol (DUONEB) 0.5-2.5 (3) MG/3ML SOLN Take 3 mLs by nebulization every 4 (four) hours as needed. PRN wheezing, SOB   Yes [provider]  lamoTRIgine (LAMICTAL) 25 MG tablet Take 25 mg by mouth 2 (two) times daily.   Yes [provider]  levETIRAcetam (KEPPRA) 1000 MG tablet Take 1,500 mg by mouth 2 (two) times daily.   Yes [provider]  loratadine (CLARITIN) 10 MG tablet Take 10 mg by mouth daily.   Yes [provider]  LORazepam (ATIVAN) 2 MG/ML injection Inject 1 mg into the muscle as needed. Repeat in 30 minutes as needed for seizure activity 08/13/20  Yes [provider]  Multiple Vitamin (MULTIVITAMIN WITH MINERALS) TABS tablet Take 1 tablet by mouth daily.   Yes [provider]  omeprazole (PRILOSEC) 20 MG capsule Take 1 capsule (20 mg total) by mouth daily. 10/07/19 11/15/20 Yes Arrien, York RamMauricio Daniel, MD  phenobarbital (LUMINAL) 16.2 MG tablet Take 2 tablets (32.4 mg total) by mouth 3 (three) times daily. START EVENING of 08/16/20 Patient taking differently: Take 32.4 mg by mouth 3 (three) times daily. For epilespsy  08/16/20  Yes Lanae BoastKc, Ramesh, MD  phenytoin (DILANTIN) 100 MG ER capsule Take 400 mg by mouth at bedtime. For epilepsy   Yes [provider]  phenytoin (DILANTIN) 100 MG ER capsule Take 200 mg by mouth daily. For epilepsy   Yes [provider]  SPS 15 GM/60ML suspension Take 60 mLs by mouth every Wednesday. 06/16/19  Yes [provider]  VIMPAT 50 MG TABS tablet Take 50 mg by mouth 2 (two) times daily. 08/19/20  Yes [provider]  Probiotic Product (ACIDOPHILUS) CHEW Chew 1 tablet by mouth in the morning, at noon, and at bedtime. Patient not taking: Reported on 11/12/2020 07/17/20   [provider]    Physical Exam: Vitals:   11/13/20 0530 11/13/20 0630 11/13/20 0800 11/13/20 0815  BP: (!) 96/57 139/76  111/74  Pulse: 73 84 87 86  Resp: 16 17 17 16   Temp:      TempSrc:      SpO2: 97% 99% 99% 98%  Weight:      Height:         Vitals:   11/13/20 0530 11/13/20 0630 11/13/20 0800 11/13/20 0815  BP: (!) 96/57  139/76  111/74  Pulse: 73 84 87 86  Resp: Temp:      TempSrc:      SpO2: 97% 99% 99% 98%  Weight:      Height:          Constitutional: Awake.  Unable to follow any commands. Not in any apparent distress.  HEENT:      Head: Normocephalic and atraumatic.         Eyes: PERLA, EOMI, Conjunctivae are normal. Sclera is non-icteric.       Mouth/Throat: Mucous membranes are moist.  Noted to have macroglossia      Neck: Supple with no signs of meningismus. Cardiovascular: Regular rate and rhythm. No murmurs, gallops, or rubs. 2+ symmetrical distal pulses are present . No JVD. No LE edema Respiratory: Respiratory effort normal .Lungs sounds clear bilaterally. No wheezes, crackles, or rhonchi.  Gastrointestinal: Soft, non tender, and non distended with positive bowel sounds.  Central adiposity Genitourinary: No CVA tenderness. Musculoskeletal: Nontender with normal range of motion in all extremities. No cyanosis, or erythema of  extremities. Neurologic:  Face is symmetric. Moving her upper extremities  Skin: Skin is warm, dry.  No rash or ulcers Psychiatric: Unable to assess   Labs on Admission: I have personally reviewed following labs and imaging studies  CBC: Recent Labs  Lab 11/12/20 2145 11/13/20 0924  WBC 12.5* 10.1  NEUTROABS 9.1*  --   HGB 10.3* 11.5*  HCT 31.4* 35.5*  MCV 91.0 89.6  PLT 256 267   Basic Metabolic Panel: Recent Labs  Lab 11/12/20 2044  NA 138  K 3.2*  CL 102  CO2 25  GLUCOSE 186*  BUN 10  CREATININE 0.66  CALCIUM 8.9   GFR: Estimated Creatinine Clearance: 89.4 mL/min (by C-G formula based on SCr of 0.66 mg/dL). Liver Function Tests: Recent Labs  Lab 11/12/20 2044  AST 26  ALT 23  ALKPHOS 215*  BILITOT 0.5  PROT 7.6  ALBUMIN 3.0*   No results for input(s): LIPASE, AMYLASE in the last 168 hours. No results for input(s): AMMONIA in the last 168 hours. Coagulation Profile: No results for input(s): INR, PROTIME in the last 168 hours. Cardiac Enzymes: No results for input(s): CKTOTAL, CKMB, CKMBINDEX, TROPONINI in the last 168 hours. BNP (last 3 results) No results for input(s): PROBNP in the last 8760 hours. HbA1C: No results for input(s): HGBA1C in the last 72 hours. CBG: No results for input(s): GLUCAP in the last 168 hours. Lipid Profile: No results for input(s): CHOL, HDL, LDLCALC, TRIG, CHOLHDL, LDLDIRECT in the last 72 hours. Thyroid Function Tests: No results for input(s): TSH, T4TOTAL, FREET4, T3FREE, THYROIDAB in the last 72 hours. Anemia Panel: No results for input(s): VITAMINB12, FOLATE, FERRITIN, TIBC, IRON, RETICCTPCT in the last 72 hours. Urine analysis:    Component Value Date/Time   COLORURINE YELLOW (A) 11/13/2020 0157   APPEARANCEUR CLOUDY (A) 11/13/2020 0157   LABSPEC 1.012 11/13/2020 0157   PHURINE 6.0 11/13/2020 0157   GLUCOSEU NEGATIVE 11/13/2020 0157   HGBUR SMALL (A) 11/13/2020 0157   BILIRUBINUR NEGATIVE 11/13/2020 0157    KETONESUR NEGATIVE 11/13/2020 0157   PROTEINUR 100 (A) 11/13/2020 0157   UROBILINOGEN 0.2 05/20/2013 0437   NITRITE NEGATIVE 11/13/2020 0157   LEUKOCYTESUR SMALL (A) 11/13/2020 0157    Radiological Exams on Admission: CT HEAD WO CONTRAST ( )  Result Date: 11/12/2020 CLINICAL DATA:  Dizziness seizure EXAM: CT HEAD WITHOUT CONTRAST TECHNIQUE: Contiguous axial images were obtained from  the base of the skull through the vertex without intravenous contrast. COMPARISON:  CT brain 09/18/2020, 08/12/2020 FINDINGS: Brain: No acute territorial infarction, hemorrhage or intracranial mass. Mild atrophy. Generalized cerebellar atrophy. Stable ventricle size. Vascular: No hyperdense vessels.  Carotid vascular calcification Skull: No fracture.  Generalized calvarial thickening Sinuses/Orbits: Left mastoid effusion. Mucosal thickening in the sinuses Other: None IMPRESSION: 1. No CT evidence for acute intracranial abnormality. 2. Chronic cerebellar atrophy. 3. Left mastoid effusion Electronically Signed   By: Jasmine Pang M.D.   On: 11/12/2020 23:47   CT ABDOMEN PELVIS W CONTRAST  Result Date: 11/13/2020 CLINICAL DATA:  Distension EXAM: CT ABDOMEN AND PELVIS WITH CONTRAST TECHNIQUE: Multidetector CT imaging of the abdomen and pelvis was performed using the standard protocol following bolus administration of intravenous contrast. CONTRAST:  OMNIPAQUE IOHEXOL 300 MG/ML  SOLN COMPARISON:  CT 10/04/2019 FINDINGS: Lower chest: Lung bases demonstrate atelectasis at the left base. No pleural effusion. Stable cardiac size. Hepatobiliary: No focal hepatic abnormality. No calcified gallstone. No biliary dilatation. Pancreas: Unremarkable. No pancreatic ductal dilatation or surrounding inflammatory changes. Spleen: Normal in size without focal abnormality. Adrenals/Urinary Tract: Right adrenal gland is normal. Stable 2.7 cm left adrenal mass. Mild left hydronephrosis and hydroureter without obstructing stone. Distended  urinary bladder. Mild urothelial enhancement on the left. Small bilateral renal cysts. Stomach/Bowel: The stomach is nonenlarged. No dilated small bowel. Mild focal is air distension of sigmoid colon which is visualized in the right hemiabdomen. Narrowed appearing segment of colon upstream to the air distended segment of sigmoid colon and decompressed appearing rectosigmoid colon. Mild rectal wall thickening. Vascular/Lymphatic: Nonaneurysmal aorta.  No suspicious nodes Reproductive: Uterus and bilateral adnexa are unremarkable. Other: Negative for free air or free fluid. Musculoskeletal: No acute or suspicious osseous abnormality IMPRESSION: 1. Mild focal air distension of the sigmoid colon which is visible in the right hemiabdomen. Narrowed appearing segments of colon upstream and downstream to the dilated segment with collapsed rectosigmoid colon, raising possibility of obstructed segment of sigmoid colon. There is no evidence for small bowel obstruction. 2. Mild left hydronephrosis and hydroureter without obstructing stone. Mild urothelial enhancement on the left, question ascending urinary tract infection. There is marked bladder distension 3. Stable 2.7 cm left adrenal mass, possibly adenoma. Electronically Signed   By: Jasmine Pang M.D.   On: 11/13/2020 00:03   DG Chest Portable 1 View  Result Date: 11/12/2020 CLINICAL DATA:  Vomiting and seizure. EXAM: PORTABLE CHEST 1 VIEW COMPARISON:  Chest radiograph dated 08/12/2020. FINDINGS: Shallow inspiration. There is cardiomegaly with vascular congestion and possible mild edema. No focal consolidation, pleural effusion pneumothorax. Atherosclerotic calcification of the aorta. No acute osseous pathology. IMPRESSION: Cardiomegaly with vascular congestion.  No focal consolidation. Electronically Signed   By: Elgie Collard M.D.   On: 11/12/2020 22:01     Assessment/Plan Principal Problem:   Acute pyelonephritis Active Problems:   Seizure disorder (HCC)    Intellectual disability   Acute urinary retention   Acute repetitive seizure (HCC)     Acute pyelonephritis  Patient has pyuria We will treat empirically with Rocephin 1 g IV daily    Acute urinary retention Foley catheter was placed in the ER with drainage of about a liter of urine She will most likely be discharged back to skilled nursing facility with a Foley catheter Follow-up with urology as an outpatient    Seizure disorder Patient is on multiple antiepileptic medications which include Keppra, Lamictal, Vimpat As needed Ativan for breakthrough seizures Place patient  on seizure precautions     DVT prophylaxis: Lovenox  Code Status: DO NOT RESUSCITATE Family Communication: Greater than 50% of time was spent discussing patient's condition and plan of care with her sister and healthcare power of attorney Kinberly Perris over the phone.  All questions and concerns have been addressed.  She verbalizes understanding and agrees to the plan. Disposition Plan: Back to previous home environment Consults called: none  Status: At the time of admission, it appears that the appropriate admission status for this patient is inpatient. This is judged to be reasonable and necessary to provide the required intensity of service to ensure the patient's safety given the presenting symptoms, physical exam findings, and initial radiographic and laboratory data in the context of their comorbid conditions. Patient requires inpatient status due to high intensity of service, high risk for further deterioration and high frequency of surveillance required.    Lucile Shutters MD [facility]     11/13/2020, 11:05 AM

## 2020-11-13 NOTE — ED Provider Notes (Signed)
CT concerning for urinary retention.  Bladder scan showing greater than 9 9 9  mL.  Foley catheter placed.  CT also consistent with pyelonephritis.  We will start patient on Rocephin.  Due to multiple seizures and current infection we will get patient admitted for IV antibiotics.  No signs of sepsis at this time     I have personally reviewed the images performed during this visit and I agree with the Radiologist's read.   Interpretation by Radiologist:  CT HEAD WO CONTRAST ( )  Result Date: 11/12/2020 CLINICAL DATA:  Dizziness seizure EXAM: CT HEAD WITHOUT CONTRAST TECHNIQUE: Contiguous axial images were obtained from the base of the skull through the vertex without intravenous contrast. COMPARISON:  CT brain 09/18/2020, 08/12/2020 FINDINGS: Brain: No acute territorial infarction, hemorrhage or intracranial mass. Mild atrophy. Generalized cerebellar atrophy. Stable ventricle size. Vascular: No hyperdense vessels.  Carotid vascular calcification Skull: No fracture.  Generalized calvarial thickening Sinuses/Orbits: Left mastoid effusion. Mucosal thickening in the sinuses Other: None IMPRESSION: 1. No CT evidence for acute intracranial abnormality. 2. Chronic cerebellar atrophy. 3. Left mastoid effusion Electronically Signed   By: 10/12/2020 M.D.   On: 11/12/2020 23:47   CT ABDOMEN PELVIS W CONTRAST  Result Date: 11/13/2020 CLINICAL DATA:  Distension EXAM: CT ABDOMEN AND PELVIS WITH CONTRAST TECHNIQUE: Multidetector CT imaging of the abdomen and pelvis was performed using the standard protocol following bolus administration of intravenous contrast. CONTRAST:  01/13/2021 OMNIPAQUE IOHEXOL 300 MG/ML  SOLN COMPARISON:  CT 10/04/2019 FINDINGS: Lower chest: Lung bases demonstrate atelectasis at the left base. No pleural effusion. Stable cardiac size. Hepatobiliary: No focal hepatic abnormality. No calcified gallstone. No biliary dilatation. Pancreas: Unremarkable. No pancreatic ductal dilatation or surrounding  inflammatory changes. Spleen: Normal in size without focal abnormality. Adrenals/Urinary Tract: Right adrenal gland is normal. Stable 2.7 cm left adrenal mass. Mild left hydronephrosis and hydroureter without obstructing stone. Distended urinary bladder. Mild urothelial enhancement on the left. Small bilateral renal cysts. Stomach/Bowel: The stomach is nonenlarged. No dilated small bowel. Mild focal is air distension of sigmoid colon which is visualized in the right hemiabdomen. Narrowed appearing segment of colon upstream to the air distended segment of sigmoid colon and decompressed appearing rectosigmoid colon. Mild rectal wall thickening. Vascular/Lymphatic: Nonaneurysmal aorta.  No suspicious nodes Reproductive: Uterus and bilateral adnexa are unremarkable. Other: Negative for free air or free fluid. Musculoskeletal: No acute or suspicious osseous abnormality IMPRESSION: 1. Mild focal air distension of the sigmoid colon which is visible in the right hemiabdomen. Narrowed appearing segments of colon upstream and downstream to the dilated segment with collapsed rectosigmoid colon, raising possibility of obstructed segment of sigmoid colon. There is no evidence for small bowel obstruction. 2. Mild left hydronephrosis and hydroureter without obstructing stone. Mild urothelial enhancement on the left, question ascending urinary tract infection. There is marked bladder distension 3. Stable 2.7 cm left adrenal mass, possibly adenoma. Electronically Signed   By: 10/06/2019 M.D.   On: 11/13/2020 00:03   DG Chest Portable 1 View  Result Date: 11/12/2020 CLINICAL DATA:  Vomiting and seizure. EXAM: PORTABLE CHEST 1 VIEW COMPARISON:  Chest radiograph dated 08/12/2020. FINDINGS: Shallow inspiration. There is cardiomegaly with vascular congestion and possible mild edema. No focal consolidation, pleural effusion pneumothorax. Atherosclerotic calcification of the aorta. No acute osseous pathology. IMPRESSION: Cardiomegaly  with vascular congestion.  No focal consolidation. Electronically Signed   By: 10/12/2020 M.D.   On: 11/12/2020 22:01      01/12/2021, MD 11/13/20 [phone]

## 2020-11-13 NOTE — Progress Notes (Signed)
Anticoagulation monitoring(Lovenox):  75 yo female ordered Lovenox 40 mg Q24h    Filed Weights   11/12/20 2009  Weight: 130.2 kg (287 lb)   BMI 43.64   Lab Results  Component Value Date   CREATININE 0.66 11/12/2020   CREATININE 0.49 10/01/2020   CREATININE 0.62 09/18/2020   Estimated Creatinine Clearance: 89.4 mL/min (by C-G formula based on SCr of 0.66 mg/dL). Hemoglobin & Hematocrit     Component Value Date/Time   HGB 10.3 (L) 11/12/2020 2145   HGB 9.9 (L) 10/11/2019 1238   HCT 31.4 (L) 11/12/2020 2145   HCT 29.8 (L) 10/11/2019 1238    Per Protocol for Patient with estCrcl > 30 ml/min and BMI > 40, will transition to Lovenox 65 mg Q24h.

## 2020-11-13 NOTE — ED Notes (Signed)
This Clinical research associate assisted pt with dinner, pt ate 100% of food.

## 2020-11-14 DIAGNOSIS — R569 Unspecified convulsions: Secondary | ICD-10-CM | POA: Diagnosis not present

## 2020-11-14 DIAGNOSIS — G40909 Epilepsy, unspecified, not intractable, without status epilepticus: Secondary | ICD-10-CM | POA: Diagnosis not present

## 2020-11-14 LAB — CBC
HCT: 34.5 % — ABNORMAL LOW (ref 36.0–46.0)
Hemoglobin: 10.9 g/dL — ABNORMAL LOW (ref 12.0–15.0)
MCH: 29.5 pg (ref 26.0–34.0)
MCHC: 31.6 g/dL (ref 30.0–36.0)
MCV: 93.2 fL (ref 80.0–100.0)
Platelets: 232 10*3/uL (ref 150–400)
RBC: 3.7 MIL/uL — ABNORMAL LOW (ref 3.87–5.11)
RDW: 15.7 % — ABNORMAL HIGH (ref 11.5–15.5)
WBC: 7 10*3/uL (ref 4.0–10.5)
nRBC: 0 % (ref 0.0–0.2)

## 2020-11-14 LAB — PHENYTOIN LEVEL, FREE AND TOTAL
Phenytoin, Free: 3 ug/mL — ABNORMAL HIGH (ref 1.0–2.0)
Phenytoin, Total: 31.3 ug/mL — ABNORMAL HIGH (ref 10.0–20.0)

## 2020-11-14 LAB — BASIC METABOLIC PANEL
Anion gap: 6 (ref 5–15)
BUN: 11 mg/dL (ref 8–23)
CO2: 26 mmol/L (ref 22–32)
Calcium: 8.7 mg/dL — ABNORMAL LOW (ref 8.9–10.3)
Chloride: 107 mmol/L (ref 98–111)
Creatinine, Ser: 0.48 mg/dL (ref 0.44–1.00)
GFR, Estimated: >60 mL/min (ref >60)
Glucose, Bld: 100 mg/dL — ABNORMAL HIGH (ref 70–99)
Potassium: 4 mmol/L (ref 3.5–5.1)
Sodium: 139 mmol/L (ref 135–145)

## 2020-11-14 LAB — SARS CORONAVIRUS 2 (TAT 6-24 HRS): SARS Coronavirus 2: NEGATIVE

## 2020-11-14 LAB — IRON AND TIBC
Iron: 44 ug/dL (ref 28–170)
Saturation Ratios: 19 % (ref 10.4–31.8)
TIBC: 227 ug/dL — ABNORMAL LOW (ref 250–450)
UIBC: 183 ug/dL

## 2020-11-14 LAB — FOLATE: Folate: 9.8 ng/mL (ref >5.9)

## 2020-11-14 LAB — PHOSPHORUS: Phosphorus: 3.1 mg/dL (ref 2.5–4.6)

## 2020-11-14 LAB — VITAMIN B12: Vitamin B-12: 647 pg/mL (ref 180–914)

## 2020-11-14 LAB — VITAMIN D 25 HYDROXY (VIT D DEFICIENCY, FRACTURES): Vit D, 25-Hydroxy: 18.8 ng/mL — ABNORMAL LOW (ref 30–100)

## 2020-11-14 LAB — MAGNESIUM: Magnesium: 2 mg/dL (ref 1.7–2.4)

## 2020-11-14 MED ORDER — POLYETHYLENE GLYCOL 3350 17 G PO PACK
17.0000 g | PACK | Freq: Every day | ORAL | Status: DC
  Administered 2020-11-14 – 2020-11-22 (×8): 17 g via ORAL
  Filled 2020-11-14 (×8): qty 1

## 2020-11-14 MED ORDER — BISACODYL 10 MG RE SUPP: 10.0000 mg | Freq: Every day | RECTAL | Status: DC | PRN

## 2020-11-14 MED ORDER — BISACODYL 5 MG PO TBEC: 10.0000 mg | DELAYED_RELEASE_TABLET | Freq: Every day | ORAL | Status: DC | PRN

## 2020-11-14 NOTE — Progress Notes (Addendum)
[facility] Progress Note  Patient: Mariah Jimenez    MKL:[id number]  DOA: 11/12/2020     Date of Service: the patient was seen and examined on 11/14/2020  Chief Complaint  Patient presents with   Seizures   Brief hospital course: Mariah Jimenez is a 75 y.o. female with medical history significant for mental retardation, morbid obesity and seizures who was brought into the ER from [facility] for evaluation of multiple seizures. Per nursing home staff patient had a focal seizure at around 6:24 PM and received a dose of IM Ativan 2 mg.  She was noted to be hypotensive by EMS with blood pressure of 83/41 and received a 500 cc fluid bolus prior to arriving to the emergency room. Patient's sister who was at the bedside was concerned about abdominal distention and patient was noted to have suprapubic tenderness with bladder distention.  Foley catheter was inserted with drainage of about 1 L of urine.  CT scan done in the ER was also consistent with urinary retention and pyelonephritis and patient received a dose of IV Rocephin. Labs show white count 12.3 >>10.1, hemoglobin 11.5, hematocrit 35.5, MCV 89.6, RDW 15.2, platelet count 267, sodium 138, potassium 3.2, chloride 102, bicarb 25, glucose 196, BUN 10, creatinine 0.6, calcium 8.9, alkaline phosphatase 215, albumin 3.0, AST 26, ALT 23, total protein 7.6 Chest x-ray reviewed by me shows cardiomegaly with vascular congestion.  No focal consolidation. CT scan of the head without contrast shows no evidence of acute intracranial abnormality.  Chronic cerebral atrophy.  Left mastoid effusion. CT scan of abdomen and pelvis with contrast shows mild focal air distension of the sigmoid colon which is visible in the right hemiabdomen. Narrowed appearing segments of colon upstream and downstream to the dilated segment with collapsed rectosigmoid colon, raising possibility of obstructed segment of sigmoid colon. There is no evidence for small bowel  obstruction. Mild left hydronephrosis and hydroureter without obstructing stone. Mild urothelial enhancement on the left, question ascending urinary tract infection. There is marked bladder distension. Stable 2.7 cm left adrenal mass, possibly adenoma. Twelve-lead EKG reviewed by me shows sinus rhythm with low voltage QRS.  Further management as below  Assessment and Plan: Principal Problem:   Acute pyelonephritis Active Problems:   Seizure disorder (HCC)   Intellectual disability   Acute urinary retention   Acute repetitive seizure (HCC)         Acute pyelonephritis Patient has pyuria Continue empirically with Rocephin 1 g IV daily Urine culture pending    Acute urinary retention Foley catheter was placed in the ER with drainage of about a liter of urine Continue Foley catheter for 1 week and follow-up as an outpatient with urology for voiding trial    Seizure disorder, cerebral palsy, continue supportive care Patient is on multiple antiepileptic medications which include Keppra, Lamictal, Vimpat As needed Ativan for breakthrough seizures Place patient on seizure precautions Phenytoin level elevated, neurology consulted, p.m. dose of discontinued on 11/14/2020 Follow neurology for further recommendation  Left adrenal mass Stable 2.7 cm left adrenal mass, possibly adenoma. Continue to follow with PCP as an outpatient, may need to be monitored  Body mass index is 43.64 kg/m.  Interventions:   Pressure Injury 10/04/19 Sacrum Right Stage 2 -  Partial thickness loss of dermis presenting as a shallow open injury with a red, pink wound bed without slough. (Active)  10/04/19 2200  Location: Sacrum  Location Orientation: Right  Staging: Stage 2 -  Partial thickness loss of  dermis presenting as a shallow open injury with a red, pink wound bed without slough.  Wound Description (Comments):   Present on Admission: Yes     Diet: Dysphagia 1 DVT Prophylaxis: Subcutaneous Lovenox    Advance goals of care discussion: DNR  Family Communication: family was NOT present at bedside, at the time of interview.  Patient has cerebral palsy, unable to communicate   Disposition:  Pt is from  [facility] care, admitted with UTI, urine retention, still has soft blood pressure, urine culture pending, which precludes a safe discharge. Discharge to [facility], when her vital signs remained stable, urine culture needs to be finalized.  May discharge in 1 to 2 days.  Subjective: No overnight events, patient was resting comfortably, patient has cerebral palsy AAO x0 patient was unable to offer any complaints.   Physical Exam: General:  NAD, lying comfortably, unable to offer any complaints due to cerebral palsy  Appear in no distress,  Eyes: PERRLA ENT: Oral Mucosa Clear, moist  Neck: no JVD,  Cardiovascular: S1 and S2 Present, no Murmur,  Respiratory: good respiratory effort, Bilateral Air entry equal and Decreased, no Crackles, no wheezes Abdomen: Bowel Sound present, Soft and no tenderness,  Skin: no rashes Extremities: no Pedal edema, no calf tenderness Neurologic: without any new focal findings Gait not checked due to patient safety concerns  Vitals:   11/14/20 0300 11/14/20 0500 11/14/20 0803 11/14/20 1147  BP:   (!) 99/56 (!) 126/54  Pulse:   77 68  Resp: 16 15 16 16   Temp:   (!) 97.5 F (36.4 C) (!) 97.2 F (36.2 C)  TempSrc:      SpO2:   100% 100%  Weight:      Height:        Intake/Output Summary (Last 24 hours) at 11/14/2020 1425 Last data filed at 11/14/2020 1253 Gross per 24 hour  Intake 808.27 ml  Output 2650 ml  Net -1841.73 ml   Filed Weights   11/12/20 2009  Weight: 130.2 kg    Data Reviewed: I have personally reviewed and interpreted daily labs, tele strips, imagings as discussed above. I reviewed all nursing notes, pharmacy notes, vitals, pertinent old records I have discussed plan of care as described above with RN and  patient/family.  CBC: Recent Labs  Lab 11/12/20 2145 11/13/20 0924 11/14/20 0950  WBC 12.5* 10.1 7.0  NEUTROABS 9.1*  --   --   HGB 10.3* 11.5* 10.9*  HCT 31.4* 35.5* 34.5*  MCV 91.0 89.6 93.2  PLT 256 267 232   Basic Metabolic Panel: Recent Labs  Lab 11/12/20 2044 11/14/20 0950  NA 138 139  K 3.2* 4.0  CL 102 107  CO2 25 26  GLUCOSE 186* 100*  BUN 10 11  CREATININE 0.66 0.48  CALCIUM 8.9 8.7*  MG  --  2.0  PHOS  --  3.1    Studies: No results found.  Scheduled Meds:  enoxaparin (LOVENOX) injection  0.5 mg/kg Subcutaneous Q24H   lacosamide  50 mg Oral BID   lamoTRIgine  25 mg Oral BID   loratadine  10 mg Oral Daily   mouth rinse  15 mL Mouth Rinse 10 times per day   multivitamin with minerals  1 tablet Oral Daily   pantoprazole  40 mg Oral Daily   phenobarbital  32.4 mg Oral TID   phenytoin  200 mg Oral Daily   phenytoin  400 mg Oral QHS   polyethylene glycol  17  g Oral Daily   Continuous Infusions:  cefTRIAXone (ROCEPHIN)  IV Stopped (11/14/20 0353)   levETIRAcetam 1,500 mg (11/14/20 1049)   PRN Meds: acetaminophen **OR** acetaminophen, bisacodyl, bisacodyl, ipratropium-albuterol, LORazepam, ondansetron **OR** ondansetron (ZOFRAN) IV  Time spent: 35 minutes  Author: Gillis Santa. MD [facility] 11/14/2020 2:25 PM  To reach On-call, see care teams to locate the attending and reach out to them via [email or web address]. If 7PM-7AM, please contact night-coverage If you still have difficulty reaching the attending provider, please page the [facility] (Director on Call) for [facility] on amion for assistance.

## 2020-11-14 NOTE — Plan of Care (Signed)
Care plan initated

## 2020-11-15 DIAGNOSIS — G40909 Epilepsy, unspecified, not intractable, without status epilepticus: Principal | ICD-10-CM

## 2020-11-15 DIAGNOSIS — R569 Unspecified convulsions: Secondary | ICD-10-CM | POA: Diagnosis not present

## 2020-11-15 LAB — CBC
HCT: 34.1 % — ABNORMAL LOW (ref 36.0–46.0)
Hemoglobin: 11.3 g/dL — ABNORMAL LOW (ref 12.0–15.0)
MCH: 29.8 pg (ref 26.0–34.0)
MCHC: 33.1 g/dL (ref 30.0–36.0)
MCV: 90 fL (ref 80.0–100.0)
Platelets: 260 10*3/uL (ref 150–400)
RBC: 3.79 MIL/uL — ABNORMAL LOW (ref 3.87–5.11)
RDW: 15.6 % — ABNORMAL HIGH (ref 11.5–15.5)
WBC: 8.4 10*3/uL (ref 4.0–10.5)
nRBC: 0 % (ref 0.0–0.2)

## 2020-11-15 LAB — BASIC METABOLIC PANEL
Anion gap: 8 (ref 5–15)
BUN: 14 mg/dL (ref 8–23)
CO2: 28 mmol/L (ref 22–32)
Calcium: 8.4 mg/dL — ABNORMAL LOW (ref 8.9–10.3)
Chloride: 106 mmol/L (ref 98–111)
Creatinine, Ser: 0.53 mg/dL (ref 0.44–1.00)
GFR, Estimated: >60 mL/min (ref >60)
Glucose, Bld: 100 mg/dL — ABNORMAL HIGH (ref 70–99)
Potassium: 4.3 mmol/L (ref 3.5–5.1)
Sodium: 142 mmol/L (ref 135–145)

## 2020-11-15 LAB — GLUCOSE, CAPILLARY
Glucose-Capillary: 120 mg/dL — ABNORMAL HIGH (ref 70–99)
Glucose-Capillary: 124 mg/dL — ABNORMAL HIGH (ref 70–99)

## 2020-11-15 LAB — PHOSPHORUS: Phosphorus: 2.7 mg/dL (ref 2.5–4.6)

## 2020-11-15 LAB — LEVETIRACETAM LEVEL: Levetiracetam Lvl: 60.2 ug/mL — ABNORMAL HIGH (ref 10.0–40.0)

## 2020-11-15 LAB — MAGNESIUM: Magnesium: 1.9 mg/dL (ref 1.7–2.4)

## 2020-11-15 MED ORDER — BISACODYL 5 MG PO TBEC
10.0000 mg | DELAYED_RELEASE_TABLET | Freq: Once | ORAL | Status: AC
  Administered 2020-11-15: 10 mg via ORAL
  Filled 2020-11-15: qty 2

## 2020-11-15 MED ORDER — VITAMIN D (ERGOCALCIFEROL) 1.25 MG (50000 UNIT) PO CAPS
50000.0000 [IU] | ORAL_CAPSULE | ORAL | Status: DC
Start: 2020-11-15 — End: 2020-11-24
  Administered 2020-11-15 – 2020-11-22 (×2): 50000 [IU] via ORAL
  Filled 2020-11-15 (×2): qty 1

## 2020-11-15 NOTE — Consult Note (Signed)
NEUROLOGY CONSULTATION NOTE   Date of service: November 15, 2020 Patient Name: Mariah Jimenez MRN:  [id number] DOB:  May 20, 1945 Reason for consult: supratherapeutic dilantin level Requesting physician: Dr. Gillis Santa _ _ _   _ __   _ __ _ _  __ __   _ __   __ _  History of Present Illness   This is a 75 yo woman with hx significant for global developmental delay and refractory seizures managed by Dr. Malvin Johns. Patient unable to provide history due to mental impairment therefore hx gleaned from chart review. She was BIB EMS from her facility 2/2 concern for breakthrough focal seizure. She received 2mg  Ativan en route. EMS noted her to be hypotensive 83/41 and she received a 500 cc bolus prior to ED arrival. She was noted to have urinary retention with pyelonephritis and was started on ceftriaxone. 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   Neurology was consulted after pharmacy noted her albumin-adjusted phenytoin level to be supratherapeutic at 44.7. Based on my review of her outpatient neurology notes it appears she was being given phenytoin 200mg  qAM + 400mg  qPM but I can only see where Dr. had recommended a total of 400mg  dosed ohs. Her evening dose of phenytoin was held last night to allow her levels to drop.  In addition to phenytoin she is also on 4 other AEDs based on my chart review: Vimpat 50mg  bid Keppra 1500mg  bid Lamotrigine 25mg  bid (started in July, currently being uptitrated) Phenobarbital 32.4mg  tid  She has not had any further events c/f seizure since admission.   ROS   UTA 2/2 encephalopathy  Past History   Past Medical History:  Diagnosis Date   Mental retardation    Seizures (HCC)    Past Surgical History:  Procedure Laterality Date   ESOPHAGOGASTRODUODENOSCOPY (EGD) WITH PROPOFOL N/A 10/10/2019   Procedure: ESOPHAGOGASTRODUODENOSCOPY (EGD) WITH PROPOFOL;  Surgeon: Malvin Johns, MD;  Location: [facility] ENDOSCOPY;  Service: Endoscopy;   Laterality: N/A;   NO PAST SURGERIES     TIBIA IM NAIL INSERTION Right 04/09/2016   Procedure: INTRAMEDULLARY (IM) NAIL TIBIAL;  Surgeon: , MD;  Location: [facility] ORS;  Service: Orthopedics;  Laterality: Right;   Family History  Family history unknown: Yes   Social History   Socioeconomic History   Marital status: Single    Spouse name: Not on file   Number of children: Not on file   Years of education: Not on file   Highest education level: Not on file  Occupational History   Not on file  Tobacco Use   Smoking status: Never   Smokeless tobacco: Never  Substance and Sexual Activity   Alcohol use: No   Drug use: No   Sexual activity: Not on file  Other Topics Concern   Not on file  Social History Narrative   Not on file   Social Determinants of Health   Financial Resource Strain: Not on file  Food Insecurity: Not on file  Transportation Needs: Not on file  Physical Activity: Not on file  Stress: Not on file  Social Connections: Not on file   No Known Allergies  Medications   Medications Prior to Admission  Medication Sig Dispense Refill Last Dose   acetaminophen (TYLENOL) 325 MG tablet Take 650 mg by mouth every 6 (six) hours as needed.   prn at prn   cloNIDine (CATAPRES) 0.1 MG tablet Take 0.1 mg by mouth 4 (four) times daily as needed.  prn at prn   diazepam (VALIUM) 5 MG tablet Take 5 mg by mouth 3 (three) times daily as needed. For signs or symptoms of pending seizure activity   prn at prn   diazepam (VALIUM) 5 MG tablet Take 2.5 mg by mouth 2 (two) times daily.   11/11/2020 at 1537   diphenhydrAMINE (BENADRYL) 25 MG tablet Take 25 mg by mouth at bedtime.   11/11/2020 at 2021   ipratropium-albuterol (DUONEB) 0.5-2.5 (3) MG/3ML SOLN Take 3 mLs by nebulization every 4 (four) hours as needed. PRN wheezing, SOB   prn at prn   lamoTRIgine (LAMICTAL) 25 MG tablet Take 25 mg by mouth 2 (two) times daily.   11/12/2020 at 0901   levETIRAcetam (KEPPRA) 1000 MG tablet  Take 1,500 mg by mouth 2 (two) times daily.   11/12/2020 at 1537   loratadine (CLARITIN) 10 MG tablet Take 10 mg by mouth daily.   11/12/2020 at 0901   LORazepam (ATIVAN) 2 MG/ML injection Inject 1 mg into the muscle as needed. Repeat in 30 minutes as needed for seizure activity   11/12/2020 at 1856   Multiple Vitamin (MULTIVITAMIN WITH MINERALS) TABS tablet Take 1 tablet by mouth daily.   11/12/2020 at 0901   omeprazole (PRILOSEC) 20 MG capsule Take 1 capsule (20 mg total) by mouth daily. 30 capsule 0 11/12/2020 at 0553   phenobarbital (LUMINAL) 16.2 MG tablet Take 2 tablets (32.4 mg total) by mouth 3 (three) times daily. START EVENING of 08/16/20 (Patient taking differently: Take 32.4 mg by mouth 3 (three) times daily.)   11/12/2020 at 1312   phenytoin (DILANTIN) 100 MG ER capsule Take 400 mg by mouth at bedtime. For epilepsy   11/11/2020 at 2021   phenytoin (DILANTIN) 100 MG ER capsule Take 200 mg by mouth daily. For epilepsy   11/12/2020 at 0901   SPS 15 GM/60ML suspension Take 60 mLs by mouth every Wednesday.   11/12/2020 at 0901   VIMPAT 50 MG TABS tablet Take 50 mg by mouth 2 (two) times daily.   11/12/2020 at 1726   Probiotic Product (ACIDOPHILUS) CHEW Chew 1 tablet by mouth in the morning, at noon, and at bedtime. (Patient not taking: Reported on 11/12/2020)   Not Taking     Vitals   Vitals:   11/15/20 0757 11/15/20 1139 11/15/20 1611 11/15/20 2013  BP: (!) 126/55 (!) 109/46 (!) 117/59 (!) 106/50  Pulse: 88 84 85 85  Resp: 16 16 17 19   Temp: (!) 97.2 F (36.2 C) (!) 97.1 F (36.2 C) (!) 97.3 F (36.3 C) 98.6 F (37 C)  TempSrc:      SpO2: 100% 96% 100% 99%  Weight:      Height:         Body mass index is 43.64 kg/m.  Physical Exam   Physical Exam Gen: alert but not oriented Resp: normal WOB CV: RRR  Neuro: *MS: alert but not oriented *Speech: severe dysarthria, no intelligible speech *CN: pupils 73mm ERRL, blinks to threat bilat, face symmetric at rest, hearing intact to voice *Motor:  unable to perform formal strength assessment 2/2 encephalopathy but she is able to move all 4 extremities spontaneously without apparent focality *Sensory: SILT *Reflexes: 2+ brisk symmetric throughout, toes mute bilat *Coordination, gait: UTA   Labs   CBC:  Recent Labs  Lab 11/12/20 2145 11/13/20 0924 11/14/20 0950 11/15/20 0627  WBC 12.5*   < > 7.0 8.4  NEUTROABS 9.1*  --   --   --  HGB 10.3*   < > 10.9* 11.3*  HCT 31.4*   < > 34.5* 34.1*  MCV 91.0   < > 93.2 90.0  PLT 256   < > 232 260   < > = values in this interval not displayed.    Basic Metabolic Panel:  Lab Results  Component Value Date   NA 142 11/15/2020   K 4.3 11/15/2020   CO2 28 11/15/2020   GLUCOSE 100 (H) 11/15/2020   BUN 14 11/15/2020   CREATININE 0.53 11/15/2020   CALCIUM 8.4 (L) 11/15/2020   GFRNONAA >60 11/15/2020   GFRAA >60 10/15/2019   Lipid Panel: No results found for: LDLCALC HgbA1c:  Lab Results  Component Value Date   HGBA1C 5.4 09/18/2020   Urine Drug Screen:     Component Value Date/Time   LABOPIA NONE DETECTED 08/12/2020 1456   COCAINSCRNUR NONE DETECTED 08/12/2020 1456   LABBENZ NONE DETECTED 08/12/2020 1456   AMPHETMU NONE DETECTED 08/12/2020 1456   THCU NONE DETECTED 08/12/2020 1456   LABBARB POSITIVE (A) 08/12/2020 1456    Alcohol Level No results found for: ETH   Impression   This is a 75 yo woman with hx significant for global developmental delay and refractory seizures managed by Dr. Malvin Johns admitted after breakthrough seizure at facility and found to have pyelonephritis (now being treated). She has longstanding very refractory epilepsy well-managed by Dr. Malvin Johns in the outpatient setting. Given that this breakthrough seizure was provoked in the setting of infection I will not recommend any changes to her regimen with the exception of decreasing the dilantin dose which is supratherapeutic.  Recommendations   - F/u outstanding AED levels - Dilantin dose held last night,  pharmacy consulted for dosing of dilantin for target adjusted level of 10-20 - Continue current doses of other AEDs including keppra, lamictal, vimpat, phenobarbital - Tx of pyelonephritis per primary team - Outpatient neurology f/u as scheduled with Dr. Malvin Johns  Neurology will sign off, but please re-engage if additional neurologic concerns arise. ______________________________________________________________________   Thank you for the opportunity to take part in the care of this patient. If you have any further questions, please contact the neurology consultation attending.  Signed,  Bing Neighbors, MD [facility] [phone]  If 7pm- 7am, please page neurology on call as listed in AMION.

## 2020-11-15 NOTE — Consult Note (Signed)
MEDICATION RELATED CONSULT NOTE - INITIAL   Pharmacy Consult for Phenytoin dosing  Indication: Seizures   No Known Allergies  Patient Measurements: Height: 5\' 8"  (172.7 cm) Weight: 130.2 kg (287 lb) IBW/kg (Calculated) : 63.9 Adjusted Body Weight: 90.4 kg    Labs: Recent Labs    11/12/20 2044 11/12/20 2145 11/13/20 0924 11/14/20 0950 11/15/20 0627  WBC  --    < > 10.1 7.0 8.4  HGB  --    < > 11.5* 10.9* 11.3*  HCT  --    < > 35.5* 34.5* 34.1*  PLT  --    < > 267 232 260  CREATININE 0.66  --   --  0.48 0.53  MG  --   --   --  2.0 1.9  PHOS  --   --   --  3.1 2.7  ALBUMIN 3.0*  --   --   --   --   PROT 7.6  --   --   --   --   AST 26  --   --   --   --   ALT 23  --   --   --   --   ALKPHOS 215*  --   --   --   --   BILITOT 0.5  --   --   --   --    < > = values in this interval not displayed.   Estimated Creatinine Clearance: 89.4 mL/min (by C-G formula based on SCr of 0.53 mg/dL).   Medical History: Past Medical History:  Diagnosis Date   Mental retardation    Seizures (HCC)     Medications:    Assessment: 75 y.o. female with medical history significant for mental retardation, morbid obesity and seizures who was brought into the ER on 8/3 from [facility] for evaluation of multiple seizures. Found to have elevated phenytoin level on admission. Pharmacy has been consulted to adjust phenytoin dose.  Drug-Drug interactions identified: Omeprazole - Phenytoin: Omeprazole ( Weak CYP2C19 inhibitor) may increase the serum concentration of phenytoin  Diazepam- Phenytoin -- Diazepam (CYY2C19 substrate) may increase serum concentrations of phenytoin  PTA dose: phenytoin ER capsule 200 mg daily + 400 mg ER capsule q HS --per neurology: patient was supposed to be on 400 mg TTD, unsure when/why dose was increased at facility  8/4 1241 Phe, Free = 3.0, Total = 31.3 Last doses: 8/4 1149 200 mg ; 2324 400 mg  8/5 1046 200 mg 8/6 0904 200 mg  Estimated t1/2 ~ 22  hr  Goal of Therapy:  Total,phenytoin level = 10-20 mg/L, Free, level = 1-2 mg/L  Plan:  Discontinue scheduled phenytoin dose Check total level with AM labs Restart reduced weight-based (AdjBW) maintenance regimen once back in therapeutic range   10/4, PharmD, BCPS Clinical Pharmacist   11/15/2020,5:20 PM

## 2020-11-15 NOTE — Progress Notes (Signed)
[facility] Progress Note  Patient: Mariah Jimenez    LKH:[id number]  DOA: 11/12/2020     Date of Service: the patient was seen and examined on 11/15/2020  Chief Complaint  Patient presents with   Seizures   Brief hospital course: [name] is a 75 y.o. female with medical history significant for mental retardation, morbid obesity and seizures who was brought into the ER from [facility] for evaluation of multiple seizures. Per nursing home staff patient had a focal seizure at around 6:24 PM and received a dose of IM Ativan 2 mg.  She was noted to be hypotensive by EMS with blood pressure of 83/41 and received a 500 cc fluid bolus prior to arriving to the emergency room. Patient's sister who was at the bedside was concerned about abdominal distention and patient was noted to have suprapubic tenderness with bladder distention.  Foley catheter was inserted with drainage of about 1 L of urine.  CT scan done in the ER was also consistent with urinary retention and pyelonephritis and patient received a dose of IV Rocephin. Labs show white count 12.3 >>10.1, hemoglobin 11.5, hematocrit 35.5, MCV 89.6, RDW 15.2, platelet count 267, sodium 138, potassium 3.2, chloride 102, bicarb 25, glucose 196, BUN 10, creatinine 0.6, calcium 8.9, alkaline phosphatase 215, albumin 3.0, AST 26, ALT 23, total protein 7.6 Chest x-ray reviewed by me shows cardiomegaly with vascular congestion.  No focal consolidation. CT scan of the head without contrast shows no evidence of acute intracranial abnormality.  Chronic cerebral atrophy.  Left mastoid effusion. CT scan of abdomen and pelvis with contrast shows mild focal air distension of the sigmoid colon which is visible in the right hemiabdomen. Narrowed appearing segments of colon upstream and downstream to the dilated segment with collapsed rectosigmoid colon, raising possibility of obstructed segment of sigmoid colon. There is no evidence for small bowel  obstruction. Mild left hydronephrosis and hydroureter without obstructing stone. Mild urothelial enhancement on the left, question ascending urinary tract infection. There is marked bladder distension. Stable 2.7 cm left adrenal mass, possibly adenoma. Twelve-lead EKG reviewed by me shows sinus rhythm with low voltage QRS.  Further management as below  Assessment and Plan: Principal Problem:   Acute pyelonephritis Active Problems:   Seizure disorder (HCC)   Intellectual disability   Acute urinary retention   Acute repetitive seizure (HCC)         Acute pyelonephritis Patient has pyuria Continue empirically with Rocephin 1 g IV daily Urine culture pending    Acute urinary retention Foley catheter was placed in the ER with drainage of about a liter of urine Continue Foley catheter for 1 week and follow-up as an outpatient with urology for voiding trial    Seizure disorder, cerebral palsy, continue supportive care Patient is on multiple antiepileptic medications which include Keppra, Lamictal, Vimpat As needed Ativan for breakthrough seizures Place patient on seizure precautions Phenytoin level elevated, neurology consulted, p.m. dose of discontinued on 11/14/2020 8/6 skipped phenytoin a.m. dose today Follow neurology for further recommendation  Left adrenal mass Stable 2.7 cm left adrenal mass, possibly adenoma. Continue to follow with PCP as an outpatient, may need to be monitored  Vitamin D deficiency, started vitamin D 50k units p.o. weekly.  Body mass index is 43.64 kg/m.  Interventions:   Pressure Injury 10/04/19 Sacrum Right Stage 2 -  Partial thickness loss of dermis presenting as a shallow open injury with a red, pink wound bed without slough. (Active)  10/04/19 2200  Location: Sacrum  Location Orientation: Right  Staging: Stage 2 -  Partial thickness loss of dermis presenting as a shallow open injury with a red, pink wound bed without slough.  Wound Description  (Comments):   Present on Admission: Yes     Diet: Dysphagia 1 DVT Prophylaxis: Subcutaneous Lovenox   Advance goals of care discussion: DNR  Family Communication: family was NOT present at bedside, at the time of interview.  Patient has cerebral palsy, unable to communicate   Disposition:  Pt is from  [facility] care, admitted with UTI, urine retention, still has soft blood pressure, urine culture pending, which precludes a safe discharge. Discharge to [facility], when her vital signs remained stable, urine culture needs to be finalized.  May discharge in 1 to 2 days.  Subjective: No overnight events, patient was resting comfortably, patient has cerebral palsy AAO x0 patient was unable to offer any complaints.   Physical Exam: General:  NAD, lying comfortably, unable to offer any complaints due to cerebral palsy  Appear in no distress,  Eyes: PERRLA ENT: Oral Mucosa Clear, moist  Neck: no JVD,  Cardiovascular: S1 and S2 Present, no Murmur,  Respiratory: good respiratory effort, Bilateral Air entry equal and Decreased, no Crackles, no wheezes Abdomen: Bowel Sound present, Soft and no tenderness,  Skin: no rashes Extremities: no Pedal edema, no calf tenderness Neurologic: without any new focal findings Gait not checked due to patient safety concerns  Vitals:   11/14/20 2159 11/15/20 0454 11/15/20 0757 11/15/20 1139  BP: (!) 130/104 124/68 (!) 126/55 (!) 109/46  Pulse: 82 77 88 84  Resp: 17 16 16 16   Temp: 97.8 F (36.6 C) 97.6 F (36.4 C) (!) 97.2 F (36.2 C) (!) 97.1 F (36.2 C)  TempSrc: Oral Oral    SpO2: 100% 100% 100% 96%  Weight:      Height:        Intake/Output Summary (Last 24 hours) at 11/15/2020 1518 Last data filed at 11/15/2020 1021 Gross per 24 hour  Intake 1055.68 ml  Output --  Net 1055.68 ml   Filed Weights   11/12/20 2009  Weight: 130.2 kg    Data Reviewed: I have personally reviewed and interpreted daily labs, tele strips,  imagings as discussed above. I reviewed all nursing notes, pharmacy notes, vitals, pertinent old records I have discussed plan of care as described above with RN and patient/family.  CBC: Recent Labs  Lab 11/12/20 2145 11/13/20 0924 11/14/20 0950 11/15/20 0627  WBC 12.5* 10.1 7.0 8.4  NEUTROABS 9.1*  --   --   --   HGB 10.3* 11.5* 10.9* 11.3*  HCT 31.4* 35.5* 34.5* 34.1*  MCV 91.0 89.6 93.2 90.0  PLT 256 267 232 260   Basic Metabolic Panel: Recent Labs  Lab 11/12/20 2044 11/14/20 0950 11/15/20 0627  NA 138 139 142  K 3.2* 4.0 4.3  CL 102 107 106  CO2 25 26 28   GLUCOSE 186* 100* 100*  BUN 10 11 14   CREATININE 0.66 0.48 0.53  CALCIUM 8.9 8.7* 8.4*  MG  --  2.0 1.9  PHOS  --  3.1 2.7    Studies: No results found.  Scheduled Meds:  enoxaparin (LOVENOX) injection  0.5 mg/kg Subcutaneous Q24H   lacosamide  50 mg Oral BID   lamoTRIgine  25 mg Oral BID   loratadine  10 mg Oral Daily   mouth rinse  15 mL Mouth Rinse 10 times per day   multivitamin with  minerals  1 tablet Oral Daily   pantoprazole  40 mg Oral Daily   phenobarbital  32.4 mg Oral TID   phenytoin  200 mg Oral Daily   polyethylene glycol  17 g Oral Daily   Vitamin D (Ergocalciferol)  50,000 Units Oral Q7 days   Continuous Infusions:  cefTRIAXone (ROCEPHIN)  IV Stopped (11/15/20 0529)   levETIRAcetam 1,500 mg (11/15/20 0908)   PRN Meds: acetaminophen **OR** acetaminophen, bisacodyl, bisacodyl, ipratropium-albuterol, LORazepam, ondansetron **OR** ondansetron (ZOFRAN) IV  Time spent: 35 minutes  Author: Gillis Santa. MD [facility] 11/15/2020 3:18 PM  To reach On-call, see care teams to locate the attending and reach out to them via [email or web address]. If 7PM-7AM, please contact night-coverage If you still have difficulty reaching the attending provider, please page the [facility] (Director on Call) for [facility] on amion for assistance.

## 2020-11-16 DIAGNOSIS — G40909 Epilepsy, unspecified, not intractable, without status epilepticus: Secondary | ICD-10-CM | POA: Diagnosis not present

## 2020-11-16 DIAGNOSIS — R569 Unspecified convulsions: Secondary | ICD-10-CM | POA: Diagnosis not present

## 2020-11-16 LAB — CBC
HCT: 33.9 % — ABNORMAL LOW (ref 36.0–46.0)
Hemoglobin: 10.9 g/dL — ABNORMAL LOW (ref 12.0–15.0)
MCH: 28.9 pg (ref 26.0–34.0)
MCHC: 32.2 g/dL (ref 30.0–36.0)
MCV: 89.9 fL (ref 80.0–100.0)
Platelets: 267 10*3/uL (ref 150–400)
RBC: 3.77 MIL/uL — ABNORMAL LOW (ref 3.87–5.11)
RDW: 15.7 % — ABNORMAL HIGH (ref 11.5–15.5)
WBC: 7.9 10*3/uL (ref 4.0–10.5)
nRBC: 0 % (ref 0.0–0.2)

## 2020-11-16 LAB — MAGNESIUM: Magnesium: 1.9 mg/dL (ref 1.7–2.4)

## 2020-11-16 LAB — BASIC METABOLIC PANEL
Anion gap: 11 (ref 5–15)
BUN: 15 mg/dL (ref 8–23)
CO2: 23 mmol/L (ref 22–32)
Calcium: 8.5 mg/dL — ABNORMAL LOW (ref 8.9–10.3)
Chloride: 104 mmol/L (ref 98–111)
Creatinine, Ser: 0.55 mg/dL (ref 0.44–1.00)
GFR, Estimated: >60 mL/min (ref >60)
Glucose, Bld: 88 mg/dL (ref 70–99)
Potassium: 4.7 mmol/L (ref 3.5–5.1)
Sodium: 138 mmol/L (ref 135–145)

## 2020-11-16 LAB — PHOSPHORUS: Phosphorus: 3 mg/dL (ref 2.5–4.6)

## 2020-11-16 LAB — URINALYSIS, COMPLETE (UACMP) WITH MICROSCOPIC
Bilirubin Urine: NEGATIVE
Glucose, UA: NEGATIVE mg/dL
Ketones, ur: NEGATIVE mg/dL
Nitrite: NEGATIVE
Protein, ur: NEGATIVE mg/dL
Specific Gravity, Urine: 1.014 (ref 1.005–1.030)
pH: 8 (ref 5.0–8.0)

## 2020-11-16 LAB — URINE CULTURE: Culture: >=100000 — AB

## 2020-11-16 LAB — PHENYTOIN LEVEL, TOTAL: Phenytoin Lvl: 26 ug/mL — ABNORMAL HIGH (ref 10.0–20.0)

## 2020-11-16 MED ORDER — FOSFOMYCIN TROMETHAMINE 3 G PO PACK
3.0000 g | PACK | Freq: Once | ORAL | Status: AC
  Administered 2020-11-16: 3 g via ORAL
  Filled 2020-11-16: qty 3

## 2020-11-16 NOTE — TOC Initial Note (Signed)
Transition of Care [facility]) - Initial/Assessment Note    Patient Details  Name: Mariah Jimenez MRN: [id number] Date of Birth: 25-Sep-1945  Transition of Care [facility]) CM/SW Contact:    Gildardo Griffes, LCSW Phone Number: 11/16/2020, 4:12 PM  Clinical Narrative:                  CSW spoke with [facility] informed facility of patient discharge tomorrow, as she is long term there. Facility in agreement with patient returning tomorrow.   Patient sister Mariah Jimenez was called, attempted to lvm but vm is full.   Expected Discharge Plan: Skilled Nursing Facility Barriers to Discharge: Continued Medical Work up   Patient Goals and CMS Choice Patient states their goals for this hospitalization and ongoing recovery are:: [facility] to [facility] CMS Medicare.gov Compare Post Acute Care list provided to:: Patient Represenative (must comment) Mariah Jimenez sister) Choice offered to / list presented to : Adult Children  Expected Discharge Plan and Services Expected Discharge Plan: Skilled Nursing Facility       Living arrangements for the past 2 months: Skilled Nursing Facility Physiological scientist)                                      Prior Living Arrangements/Services Living arrangements for the past 2 months: Skilled Nursing Facility Physiological scientist) Lives with:: Facility Resident   Do you feel safe going back to the place where you live?: Yes               Activities of Daily Living Home Assistive Devices/Equipment: Other (Comment) ADL Screening (condition at time of admission) Patient's cognitive ability adequate to safely complete daily activities?: No Is the patient deaf or have difficulty hearing?: No Does the patient have difficulty seeing, even when wearing glasses/contacts?: No Does the patient have difficulty concentrating, remembering, or making decisions?: Yes Patient able to express need for assistance with ADLs?: No Does the patient have difficulty dressing or bathing?: Yes Independently performs  ADLs?: No Communication: Dependent Is this a change from baseline?: Pre-admission baseline Dressing (OT): Dependent Is this a change from baseline?: Pre-admission baseline Grooming: Dependent Is this a change from baseline?: Pre-admission baseline Feeding: Dependent Is this a change from baseline?: Pre-admission baseline Bathing: Dependent Is this a change from baseline?: Pre-admission baseline Toileting: Dependent Is this a change from baseline?: Pre-admission baseline In/Out Bed: Dependent Is this a change from baseline?: Pre-admission baseline Walks in Home: Dependent Is this a change from baseline?: Pre-admission baseline Does the patient have difficulty walking or climbing stairs?: Yes Weakness of Legs: Both Weakness of Arms/Hands: Both  Permission Sought/Granted                  Emotional Assessment         Alcohol / Substance Use: Not Applicable Psych Involvement: No (comment)  Admission diagnosis:  Seizure (HCC) [R56.9] Pyelonephritis [N12] Patient Active Problem List   Diagnosis Date Noted   Acute pyelonephritis 11/13/2020   Acute urinary retention 11/13/2020   Acute repetitive seizure (HCC) 11/13/2020   Seizure (HCC) 11/13/2020   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   Gastric polyp    Class 3 obesity 10/07/2019   Sinus tachycardia 10/05/2019   Pressure injury of skin 10/05/2019   Dehydration 10/05/2019   Intractable nausea and vomiting 10/04/2019   Intellectual disability 10/04/2019   Elevated lipase 10/04/2019   Seizure disorder (HCC) 08/18/2019   UTI (urinary tract infection)  08/18/2019   Hypothermia 08/18/2019   Acute respiratory failure with hypoxia (HCC) 08/18/2019   Palliative care encounter 10/06/2018   Debility 10/06/2018   Tibia/fibula fracture 04/09/2016   PCP:  Keane Police, MD Pharmacy:   [facility], [place] - [address] [facility] [place]. [address] [place] [address] Phone: [phone] Fax:  [phone]     Social Determinants of Health (SDOH) Interventions    Readmission Risk Interventions No flowsheet data found.

## 2020-11-16 NOTE — Progress Notes (Signed)
PROGRESS NOTE    Mariah Jimenez  IRJ:[id number] DOB: Sep 28, 1945 DOA: 11/12/2020 PCP: Keane Police, MD    Brief Narrative:  Mariah Jimenez is a 75 y.o. female with medical history significant for mental retardation, morbid obesity and seizures who was brought into the ER from [facility] for evaluation of multiple seizures. Per nursing home staff patient had a focal seizure at around 6:24 PM and received a dose of IM Ativan 2 mg.  She was noted to be hypotensive by EMS with blood pressure of 83/41 and received a 500 cc fluid bolus prior to arriving to the emergency room. Patient's sister who was at the bedside was concerned about abdominal distention and patient was noted to have suprapubic tenderness with bladder distention.  Foley catheter was inserted with drainage of about 1 L of urine.  CT scan done in the ER was also consistent with urinary retention and pyelonephritis and patient received a dose of IV Rocephin. Labs show white count 12.3 >>10.1, hemoglobin 11.5, hematocrit 35.5, MCV 89.6, RDW 15.2, platelet count 267, sodium 138, potassium 3.2, chloride 102, bicarb 25, glucose 196, BUN 10, creatinine 0.6, calcium 8.9, alkaline phosphatase 215, albumin 3.0, AST 26, ALT 23, total protein 7.6 Chest x-ray reviewed by me shows cardiomegaly with vascular congestion.  No focal consolidation. CT scan of the head without contrast shows no evidence of acute intracranial abnormality.  Chronic cerebral atrophy.  Left mastoid effusion. CT scan of abdomen and pelvis with contrast shows mild focal air distension of the sigmoid colon which is visible in the right hemiabdomen. Narrowed appearing segments of colon upstream and downstream to the dilated segment with collapsed rectosigmoid colon, raising possibility of obstructed segment of sigmoid colon. There is no evidence for small bowel obstruction. Mild left hydronephrosis and hydroureter without obstructing stone. Mild urothelial  enhancement on the left, question ascending urinary tract infection. There is marked bladder distension. Stable 2.7 cm left adrenal mass, possibly adenoma. Twelve-lead EKG reviewed by me shows sinus rhythm with low voltage QRS.    Consultants:  Neurology  Procedures:   Antimicrobials:      Subjective: Patient is nonverbal only grunts  Objective: Vitals:   11/16/20 0330 11/16/20 0759 11/16/20 1134 11/16/20 1517  BP: 135/67 131/75 123/66 (!) 100/56  Pulse: 87 96 86 76  Resp: 18 19 17 16   Temp: 98.6 F (37 C) 97.7 F (36.5 C) (!) 97.5 F (36.4 C) 98.5 F (36.9 C)  TempSrc:      SpO2: 100% 100% 100% 99%  Weight:      Height:        Intake/Output Summary (Last 24 hours) at 11/16/2020 1608 Last data filed at 11/16/2020 1053 Gross per 24 hour  Intake 202.33 ml  Output 800 ml  Net -597.67 ml   Filed Weights   11/12/20 2009  Weight: 130.2 kg    Examination:  General exam: Appears calm and comfortable  Respiratory system: Clear to auscultation. Respiratory effort normal. Cardiovascular system: S1 & S2 heard, RRR.  No gallops  Gastrointestinal system: Abdomen is nondistended, soft and nontender.. Normal bowel sounds heard. Central nervous system: Awake and alert button nonverbal.  Not able to do CNS exam  Extremities: No edema     Data Reviewed: I have personally reviewed following labs and imaging studies  CBC: Recent Labs  Lab 11/12/20 2145 11/13/20 0924 11/14/20 0950 11/15/20 0627 11/16/20 0642  WBC 12.5* 10.1 7.0 8.4 7.9  NEUTROABS 9.1*  --   --   --   --  HGB 10.3* 11.5* 10.9* 11.3* 10.9*  HCT 31.4* 35.5* 34.5* 34.1* 33.9*  MCV 91.0 89.6 93.2 90.0 89.9  PLT 256 267 232 260 267   Basic Metabolic Panel: Recent Labs  Lab 11/12/20 2044 11/14/20 0950 11/15/20 0627 11/16/20 0603  NA 138 139 142 138  K 3.2* 4.0 4.3 4.7  CL 102 107 106 104  CO2 25 26 28 23   GLUCOSE 186* 100* 100* 88  BUN 10 11 14 15   CREATININE 0.66 0.48 0.53 0.55  CALCIUM 8.9  8.7* 8.4* 8.5*  MG  --  2.0 1.9 1.9  PHOS  --  3.1 2.7 3.0   GFR: Estimated Creatinine Clearance: 89.4 mL/min (by C-G formula based on SCr of 0.55 mg/dL). Liver Function Tests: Recent Labs  Lab 11/12/20 2044  AST 26  ALT 23  ALKPHOS 215*  BILITOT 0.5  PROT 7.6  ALBUMIN 3.0*   No results for input(s): LIPASE, AMYLASE in the last 168 hours. No results for input(s): AMMONIA in the last 168 hours. Coagulation Profile: No results for input(s): INR, PROTIME in the last 168 hours. Cardiac Enzymes: No results for input(s): CKTOTAL, CKMB, CKMBINDEX, TROPONINI in the last 168 hours. BNP (last 3 results) No results for input(s): PROBNP in the last 8760 hours. HbA1C: No results for input(s): HGBA1C in the last 72 hours. CBG: Recent Labs  Lab 11/15/20 1143 11/15/20 1609  GLUCAP 120* 124*   Lipid Profile: No results for input(s): CHOL, HDL, LDLCALC, TRIG, CHOLHDL, LDLDIRECT in the last 72 hours. Thyroid Function Tests: No results for input(s): TSH, T4TOTAL, FREET4, T3FREE, THYROIDAB in the last 72 hours. Anemia Panel: Recent Labs    11/14/20 0950  VITAMINB12 647  FOLATE 9.8  TIBC 227*  IRON 44   Sepsis Labs: No results for input(s): PROCALCITON, LATICACIDVEN in the last 168 hours.  Recent Results (from the past 240 hour(s))  Urine Culture     Status: Abnormal   Collection Time: 11/13/20  1:57 AM   Specimen: Urine, Catheterized  Result Value Ref Range Status   Specimen Description   Final    URINE, CATHETERIZED Performed at [facility], [address], [place], [address] [place]    Special Requests   Final    NONE Performed at [facility], [address] [place], [address] [place]    Culture (A)  Final    >=100,000 COLONIES/mL AEROCOCCUS URINAE Standardized susceptibility testing for this organism is not available. 40,000 COLONIES/mL ENTEROCOCCUS FAECALIS    Report Status 11/16/2020 FINAL  Final   Organism ID, Bacteria ENTEROCOCCUS FAECALIS  (A)  Final      Susceptibility   Enterococcus faecalis - MIC*    AMPICILLIN <=2 SENSITIVE Sensitive     NITROFURANTOIN <=16 SENSITIVE Sensitive     VANCOMYCIN 1 SENSITIVE Sensitive     * 40,000 COLONIES/mL ENTEROCOCCUS FAECALIS  SARS CORONAVIRUS 2 (TAT 6-24 HRS) Nasopharyngeal Nasopharyngeal Swab     Status: None   Collection Time: 11/13/20  8:36 PM   Specimen: Nasopharyngeal Swab  Result Value Ref Range Status   SARS Coronavirus 2 NEGATIVE NEGATIVE Final    Comment: (NOTE) SARS-CoV-2 target nucleic acids are NOT DETECTED.  The SARS-CoV-2 RNA is generally detectable in upper and lower respiratory specimens during the acute phase of infection. Negative results do not preclude SARS-CoV-2 infection, do not rule out co-infections with other pathogens, and should not be used as the sole basis for treatment or other patient management decisions. Negative results must be combined with  clinical observations, patient history, and epidemiological information. The expected result is Negative.  Fact Sheet for Patients: HairSlick.no  Fact Sheet for Healthcare Providers: quierodirigir.com  This test is not yet approved or cleared by the [place] FDA and  has been authorized for detection and/or diagnosis of SARS-CoV-2 by FDA under an Emergency Use Authorization (EUA). This EUA will remain  in effect (meaning this test can be used) for the duration of the COVID-19 declaration under Se ction 564(b)(1) of the Act, 21 U.S.C. section 360bbb-3(b)(1), unless the authorization is terminated or revoked sooner.  Performed at [facility], [address], [place], [place] [address]          Radiology Studies: No results found.      Scheduled Meds:  enoxaparin (LOVENOX) injection  0.5 mg/kg Subcutaneous Q24H   fosfomycin  3 g Oral Once   lacosamide  50 mg Oral BID   lamoTRIgine  25 mg Oral BID   loratadine  10 mg Oral  Daily   mouth rinse  15 mL Mouth Rinse 10 times per day   multivitamin with minerals  1 tablet Oral Daily   pantoprazole  40 mg Oral Daily   phenobarbital  32.4 mg Oral TID   polyethylene glycol  17 g Oral Daily   Vitamin D (Ergocalciferol)  50,000 Units Oral Q7 days   Continuous Infusions:  levETIRAcetam 1,500 mg (11/16/20 1053)    Assessment & Plan:   Principal Problem:   Acute pyelonephritis Active Problems:   Seizure disorder (HCC)   Intellectual disability   Acute urinary retention   Acute repetitive seizure (HCC)   Acute pyelonephritis- ruled out Patient has pyuria 8/7 was getting Rocephin, will give fosfomycin since urine culture positive for both organisms +ucx with aerococcus urinae, ente. Fac.     Acute urinary retention Foley catheter was placed in the ER with drainage of about a liter of urine 8/7 continue Foley catheter in 1 week with follow-up with urology for voiding trial        Seizure disorder, cerebral palsy, continue supportive care Patient is on multiple antiepileptic medications which include Keppra, Lamictal, Vimpat As needed Ativan for breakthrough seizures Place patient on seizure precautions Phenytoin level elevated, neurology consulted, p.m. dose of discontinued on 11/14/2020 8/7 phenytoin level 26, pharmacy managing level.  Likely hold today's dose and recheck in a.m.  Neurology's input was appreciated.  Recommend Dilantin target level of 10-20 Continue current doses of other AEDs including Keppra, Lamictal, Vimpat, phenobarbital Outpatient neurology follow-up with Dr. Malvin Johns   Left adrenal mass Stable 2.7 cm left adrenal mass, possibly adenoma. 8/7 continue follow-up with PCP as outpatient may need monitoring      Vitamin D deficiency, started vitamin D 50k units p.o. weekly.   Body mass index is 43.64 kg/m. Interventions:   Pressure Injury 10/04/19 Sacrum Right Stage 2 -  Partial thickness loss of dermis presenting as a shallow  open injury with a red, pink wound bed without slough. (Active)  10/04/19 2200  Location: Sacrum  Location Orientation: Right  Staging: Stage 2 -  Partial thickness loss of dermis presenting as a shallow open injury with a red, pink wound bed without slough.  Wound Description (Comments):  Present on Admission: Yes        DVT prophylaxis: Lovenox Code Status: DNR Family Communication: None at bedside Disposition Plan:  Status is: Inpatient  Remains inpatient appropriate because:Inpatient level of care appropriate due to severity of illness  Dispo: The  patient is from: ALF              Anticipated d/c is to: ALF              Patient currently is not medically stable to d/c.   Difficult to place patient No            LOS: 3 days   Time spent: 35 minutes with more than 50% on COC    Lynn ItoSahar Lowella Kindley, MD [facility] Pager [phone]  If 7PM-7AM, please contact night-coverage 11/16/2020, 4:08 PM

## 2020-11-16 NOTE — [place] FL2 (Signed)
[place] MEDICAID FL2 LEVEL OF CARE SCREENING TOOL     IDENTIFICATION  Patient Name: Mariah Jimenez Birthdate: 12/24/1945 Sex: female Admission Date (Current Location): 11/12/2020  [facility] and [place] Number:  Chiropodist and Address:  [facility], [address], [place], [place] [address]      Provider Number: [id number]  Attending Physician Name and Address:  Lynn Ito, MD  Relative Name and Phone Number:  [name] (sister) [phone]    Current Level of Care: Hospital Recommended Level of Care: Skilled Nursing Facility Prior Approval Number:    Date Approved/Denied:   PASRR Number:    Discharge Plan: SNF    Current Diagnoses: Patient Active Problem List   Diagnosis Date Noted   Acute pyelonephritis 11/13/2020   Acute urinary retention 11/13/2020   Acute repetitive seizure (HCC) 11/13/2020   Seizure (HCC) 11/13/2020   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   Gastric polyp    Class 3 obesity 10/07/2019   Sinus tachycardia 10/05/2019   Pressure injury of skin 10/05/2019   Dehydration 10/05/2019   Intractable nausea and vomiting 10/04/2019   Intellectual disability 10/04/2019   Elevated lipase 10/04/2019   Seizure disorder (HCC) 08/18/2019   UTI (urinary tract infection) 08/18/2019   Hypothermia 08/18/2019   Acute respiratory failure with hypoxia (HCC) 08/18/2019   Palliative care encounter 10/06/2018   Debility 10/06/2018   Tibia/fibula fracture 04/09/2016    Orientation RESPIRATION BLADDER Height & Weight     Self  Normal Incontinent, External catheter Weight: 287 lb (130.2 kg) Height:  5\' 8"  (172.7 cm)  BEHAVIORAL SYMPTOMS/MOOD NEUROLOGICAL BOWEL NUTRITION STATUS      Incontinent Diet (see dc summary)  AMBULATORY STATUS COMMUNICATION OF NEEDS Skin   Extensive Assist Verbally Normal                       Personal Care Assistance Level of Assistance  Bathing, Feeding, Dressing, Total care Bathing Assistance:  Maximum assistance Feeding assistance: Maximum assistance Dressing Assistance: Maximum assistance Total Care Assistance: Maximum assistance   Functional Limitations Info  Sight, Hearing, Speech Sight Info: Adequate Hearing Info: Adequate Speech Info: Adequate    SPECIAL CARE FACTORS FREQUENCY                       Contractures Contractures Info: Not present    Additional Factors Info  Code Status, Allergies Code Status Info: DNR Allergies Info: No [place] Allergies           Current Medications (11/16/2020):  This is the current hospital active medication list Current Facility-Administered Medications  Medication Dose Route Frequency Provider Last Rate Last Admin   acetaminophen (TYLENOL) tablet 650 mg  650 mg Oral Q6H PRN 01/16/2021, MD   650 mg at 11/14/20 01/14/21   Or   acetaminophen (TYLENOL) suppository 650 mg  650 mg Rectal Q6H PRN 9702, MD       bisacodyl (DULCOLAX) EC tablet 10 mg  10 mg Oral Daily PRN Andris Baumann, MD       bisacodyl (DULCOLAX) suppository 10 mg  10 mg Rectal Daily PRN Gillis Santa, MD       enoxaparin (LOVENOX) injection 65 mg  0.5 mg/kg Subcutaneous Q24H Gillis Santa V, MD   65 mg at 11/16/20 0900   fosfomycin (MONUROL) packet 3 g  3 g Oral Once 01/16/21, MD       ipratropium-albuterol (DUONEB) 0.5-2.5 (3) MG/3ML nebulizer solution 3  mL  3 mL Nebulization Q4H PRN Agbata, Tochukwu, MD       lacosamide (VIMPAT) tablet 50 mg  50 mg Oral BID Agbata, Tochukwu, MD   50 mg at 11/16/20 1040   lamoTRIgine (LAMICTAL) tablet 25 mg  25 mg Oral BID Concha Se, MD   25 mg at 11/16/20 1040   levETIRAcetam (KEPPRA) IVPB 1500 mg/ 100 mL premix  1,500 mg Intravenous Q12H Concha Se, MD 400 mL/hr at 11/16/20 1053 1,500 mg at 11/16/20 1053   loratadine (CLARITIN) tablet 10 mg  10 mg Oral Daily Agbata, Tochukwu, MD   10 mg at 11/16/20 1040   LORazepam (ATIVAN) injection 2 mg  2 mg Intravenous Q5 Min x 2 PRN Andris Baumann, MD        MEDLINE mouth rinse  15 mL Mouth Rinse 10 times per day Andris Baumann, MD   15 mL at 11/16/20 1100   multivitamin with minerals tablet 1 tablet  1 tablet Oral Daily Agbata, Tochukwu, MD   1 tablet at 11/16/20 1039   ondansetron (ZOFRAN) tablet 4 mg  4 mg Oral Q6H PRN Andris Baumann, MD       Or   ondansetron [facility]) injection 4 mg  4 mg Intravenous Q6H PRN Andris Baumann, MD       pantoprazole (PROTONIX) EC tablet 40 mg  40 mg Oral Daily Agbata, Tochukwu, MD   40 mg at 11/16/20 1040   PHENobarbital (LUMINAL) tablet 32.4 mg  32.4 mg Oral TID Concha Se, MD   32.4 mg at 11/16/20 1040   polyethylene glycol (MIRALAX / GLYCOLAX) packet 17 g  17 g Oral Daily Gillis Santa, MD   17 g at 11/16/20 1040   Vitamin D (Ergocalciferol) (DRISDOL) capsule 50,000 Units  50,000 Units Oral Q7 days Gillis Santa, MD   50,000 Units at 11/15/20 1100     Discharge Medications: Please see discharge summary for a list of discharge medications.  Relevant Imaging Results:  Relevant Lab Results:   Additional Information SSN: [id number]  Gildardo Griffes, LCSW

## 2020-11-16 NOTE — Consult Note (Signed)
MEDICATION RELATED CONSULT NOTE  Pharmacy Consult for Phenytoin dosing  Indication: Seizures   No Known Allergies  Patient Measurements: Height: 5\' 8"  (172.7 cm) Weight: 130.2 kg (287 lb) IBW/kg (Calculated) : 63.9 Adjusted Body Weight: 90.4 kg    Labs: Recent Labs    11/14/20 0950 11/15/20 0627 11/16/20 0603 11/16/20 0642  WBC 7.0 8.4  --  7.9  HGB 10.9* 11.3*  --  10.9*  HCT 34.5* 34.1*  --  33.9*  PLT 232 260  --  267  CREATININE 0.48 0.53 0.55  --   MG 2.0 1.9 1.9  --   PHOS 3.1 2.7 3.0  --     Estimated Creatinine Clearance: 89.4 mL/min (by C-G formula based on SCr of 0.55 mg/dL).   Medical History: Past Medical History:  Diagnosis Date   Mental retardation    Seizures (HCC)     Medications:    Assessment: 75 y.o. female with medical history significant for mental retardation, morbid obesity and seizures who was brought into the ER on 8/3 from [facility] for evaluation of multiple seizures. Found to have elevated phenytoin level on admission. Pharmacy has been consulted to adjust phenytoin dose.  Drug-Drug interactions identified: Omeprazole - Phenytoin: Omeprazole ( Weak CYP2C19 inhibitor) may increase the serum concentration of phenytoin  Diazepam- Phenytoin -- Diazepam (CYY2C19 substrate) may increase serum concentrations of phenytoin  PTA dose: phenytoin ER capsule 200 mg daily + 400 mg ER capsule q HS (per nursing home administration guide)  *Per last Neurology note from Dr. 10/3 6/27, patient was to be on phenytoin 400 mg ER qhs in addition to her other antiepileptic medications, including new start of lamotrigine. It is unclear when/who increased the dose.*   8/4 1241 Phe, Free = 3.0, Total = 31.3 Last doses: 8/4 1149 200 mg ; 2324 400 mg  8/5 1046 200 mg 8/6 0904 200 mg   8/5 0603 Phe, total = 26  Goal of Therapy:  Total phenytoin level = 10-20 mg/L, Free, level = 1-2 mg/L  Plan:  Phenytoin level remains elevated; doses on  hold Repeat total level with AM labs Once total level < 20 mg/L, plan to restart phenytoin 400 mg ER at bedtime (which is the dose patient was supposed to be on per her last visit with Neurology - Dr. 7/27) Plan discussed with Dr. Malvin Johns, PharmD, BCPS Clinical Pharmacist   11/16/2020,10:11 AM

## 2020-11-17 DIAGNOSIS — R569 Unspecified convulsions: Secondary | ICD-10-CM | POA: Diagnosis not present

## 2020-11-17 DIAGNOSIS — G40909 Epilepsy, unspecified, not intractable, without status epilepticus: Secondary | ICD-10-CM | POA: Diagnosis not present

## 2020-11-17 LAB — CBC
HCT: 32.4 % — ABNORMAL LOW (ref 36.0–46.0)
Hemoglobin: 10.4 g/dL — ABNORMAL LOW (ref 12.0–15.0)
MCH: 28.5 pg (ref 26.0–34.0)
MCHC: 32.1 g/dL (ref 30.0–36.0)
MCV: 88.8 fL (ref 80.0–100.0)
Platelets: 256 10*3/uL (ref 150–400)
RBC: 3.65 MIL/uL — ABNORMAL LOW (ref 3.87–5.11)
RDW: 15.5 % (ref 11.5–15.5)
WBC: 7.7 10*3/uL (ref 4.0–10.5)
nRBC: 0 % (ref 0.0–0.2)

## 2020-11-17 LAB — BASIC METABOLIC PANEL
Anion gap: 9 (ref 5–15)
BUN: 15 mg/dL (ref 8–23)
CO2: 28 mmol/L (ref 22–32)
Calcium: 9.1 mg/dL (ref 8.9–10.3)
Chloride: 105 mmol/L (ref 98–111)
Creatinine, Ser: 0.38 mg/dL — ABNORMAL LOW (ref 0.44–1.00)
GFR, Estimated: >60 mL/min (ref >60)
Glucose, Bld: 104 mg/dL — ABNORMAL HIGH (ref 70–99)
Potassium: 3.7 mmol/L (ref 3.5–5.1)
Sodium: 142 mmol/L (ref 135–145)

## 2020-11-17 LAB — MAGNESIUM: Magnesium: 2 mg/dL (ref 1.7–2.4)

## 2020-11-17 LAB — PHOSPHORUS: Phosphorus: 2.8 mg/dL (ref 2.5–4.6)

## 2020-11-17 LAB — PHENYTOIN LEVEL, TOTAL: Phenytoin Lvl: 30.1 ug/mL (ref 10.0–20.0)

## 2020-11-17 MED ORDER — LEVETIRACETAM 750 MG PO TABS
1500.0000 mg | ORAL_TABLET | Freq: Two times a day (BID) | ORAL | Status: DC
  Administered 2020-11-17 – 2020-11-24 (×15): 1500 mg via ORAL
  Filled 2020-11-17: qty 2
  Filled 2020-11-17: qty 3
  Filled 2020-11-17 (×2): qty 2
  Filled 2020-11-17: qty 3
  Filled 2020-11-17 (×2): qty 2
  Filled 2020-11-17: qty 3
  Filled 2020-11-17: qty 2
  Filled 2020-11-17: qty 3
  Filled 2020-11-17 (×2): qty 2
  Filled 2020-11-17: qty 3
  Filled 2020-11-17 (×3): qty 2

## 2020-11-17 NOTE — Progress Notes (Signed)
PROGRESS NOTE    Mariah Jimenez  ZHY:[id number] DOB: June 02, 1945 DOA: 11/12/2020 PCP: Keane Police, MD    Brief Narrative:  Mariah Jimenez is a 75 y.o. female with medical history significant for mental retardation, morbid obesity and seizures who was brought into the ER from [facility] for evaluation of multiple seizures. Per nursing home staff patient had a focal seizure at around 6:24 PM and received a dose of IM Ativan 2 mg.  She was noted to be hypotensive by EMS with blood pressure of 83/41 and received a 500 cc fluid bolus prior to arriving to the emergency room. Patient's sister who was at the bedside was concerned about abdominal distention and patient was noted to have suprapubic tenderness with bladder distention.  Foley catheter was inserted with drainage of about 1 L of urine.  CT scan done in the ER was also consistent with urinary retention and pyelonephritis and patient received a dose of IV Rocephin. Labs show white count 12.3 >>10.1, hemoglobin 11.5, hematocrit 35.5, MCV 89.6, RDW 15.2, platelet count 267, sodium 138, potassium 3.2, chloride 102, bicarb 25, glucose 196, BUN 10, creatinine 0.6, calcium 8.9, alkaline phosphatase 215, albumin 3.0, AST 26, ALT 23, total protein 7.6 Chest x-ray reviewed by me shows cardiomegaly with vascular congestion.  No focal consolidation. CT scan of the head without contrast shows no evidence of acute intracranial abnormality.  Chronic cerebral atrophy.  Left mastoid effusion. CT scan of abdomen and pelvis with contrast shows mild focal air distension of the sigmoid colon which is visible in the right hemiabdomen. Narrowed appearing segments of colon upstream and downstream to the dilated segment with collapsed rectosigmoid colon, raising possibility of obstructed segment of sigmoid colon. There is no evidence for small bowel obstruction. Mild left hydronephrosis and hydroureter without obstructing stone. Mild urothelial  enhancement on the left, question ascending urinary tract infection. There is marked bladder distension. Stable 2.7 cm left adrenal mass, possibly adenoma. Twelve-lead EKG reviewed by me shows sinus rhythm with low voltage QRS.  8/8-phenytoin level actually increased to 30.1 .goal 10-20.  Per nursing patient's p.o. intake is good.  Discussed level with pharmacy they   Consultants:  Neurology  Procedures:   Antimicrobials:      Subjective: During exam patient groans however difficult to understand  Objective: Vitals:   11/16/20 1517 11/16/20 2056 11/17/20 0320 11/17/20 0406  BP: (!) 100/56 132/60  130/61  Pulse: 76 96  93  Resp: 16 17  16   Temp: 98.5 F (36.9 C) (!) 97.5 F (36.4 C)  97.7 F (36.5 C)  TempSrc:      SpO2: 99% 99%  100%  Weight:   122.8 kg   Height:        Intake/Output Summary (Last 24 hours) at 11/17/2020 0815 Last data filed at 11/16/2020 2155 Gross per 24 hour  Intake 123.32 ml  Output 700 ml  Net -576.68 ml   Filed Weights   11/12/20 2009 11/17/20 0320  Weight: 130.2 kg 122.8 kg    Examination: NAD, calm CTA no wheezing Regular S1-S2 distant heart sounds Soft benign positive bowel sounds No edema Mood and affect appropriate in current setting    Data Reviewed: I have personally reviewed following labs and imaging studies  CBC: Recent Labs  Lab 11/12/20 2145 11/13/20 0924 11/14/20 0950 11/15/20 0627 11/16/20 0642 11/17/20 0612  WBC 12.5* 10.1 7.0 8.4 7.9 7.7  NEUTROABS 9.1*  --   --   --   --   --  HGB 10.3* 11.5* 10.9* 11.3* 10.9* 10.4*  HCT 31.4* 35.5* 34.5* 34.1* 33.9* 32.4*  MCV 91.0 89.6 93.2 90.0 89.9 88.8  PLT 256 267 232 260 267 256   Basic Metabolic Panel: Recent Labs  Lab 11/12/20 2044 11/14/20 0950 11/15/20 0627 11/16/20 0603 11/17/20 0612  NA 138 139 142 138 142  K 3.2* 4.0 4.3 4.7 3.7  CL 102 107 106 104 105  CO2 25 26 28 23 28   GLUCOSE 186* 100* 100* 88 104*  BUN 10 11 14 15 15   CREATININE 0.66 0.48  0.53 0.55 0.38*  CALCIUM 8.9 8.7* 8.4* 8.5* 9.1  MG  --  2.0 1.9 1.9 2.0  PHOS  --  3.1 2.7 3.0 2.8   GFR: Estimated Creatinine Clearance: 86.5 mL/min (A) (by C-G formula based on SCr of 0.38 mg/dL (L)). Liver Function Tests: Recent Labs  Lab 11/12/20 2044  AST 26  ALT 23  ALKPHOS 215*  BILITOT 0.5  PROT 7.6  ALBUMIN 3.0*   No results for input(s): LIPASE, AMYLASE in the last 168 hours. No results for input(s): AMMONIA in the last 168 hours. Coagulation Profile: No results for input(s): INR, PROTIME in the last 168 hours. Cardiac Enzymes: No results for input(s): CKTOTAL, CKMB, CKMBINDEX, TROPONINI in the last 168 hours. BNP (last 3 results) No results for input(s): PROBNP in the last 8760 hours. HbA1C: No results for input(s): HGBA1C in the last 72 hours. CBG: Recent Labs  Lab 11/15/20 1143 11/15/20 1609  GLUCAP 120* 124*   Lipid Profile: No results for input(s): CHOL, HDL, LDLCALC, TRIG, CHOLHDL, LDLDIRECT in the last 72 hours. Thyroid Function Tests: No results for input(s): TSH, T4TOTAL, FREET4, T3FREE, THYROIDAB in the last 72 hours. Anemia Panel: Recent Labs    11/14/20 0950  VITAMINB12 647  FOLATE 9.8  TIBC 227*  IRON 44   Sepsis Labs: No results for input(s): PROCALCITON, LATICACIDVEN in the last 168 hours.  Recent Results (from the past 240 hour(s))  Urine Culture     Status: Abnormal   Collection Time: 11/13/20  1:57 AM   Specimen: Urine, Catheterized  Result Value Ref Range Status   Specimen Description   Final    URINE, CATHETERIZED Performed at [facility], [address], [place], [address] [place]    Special Requests   Final    NONE Performed at [facility], [address] [place], [address] [place]    Culture (A)  Final    >=100,000 COLONIES/mL AEROCOCCUS URINAE Standardized susceptibility testing for this organism is not available. 40,000 COLONIES/mL ENTEROCOCCUS FAECALIS    Report Status 11/16/2020 FINAL   Final   Organism ID, Bacteria ENTEROCOCCUS FAECALIS (A)  Final      Susceptibility   Enterococcus faecalis - MIC*    AMPICILLIN <=2 SENSITIVE Sensitive     NITROFURANTOIN <=16 SENSITIVE Sensitive     VANCOMYCIN 1 SENSITIVE Sensitive     * 40,000 COLONIES/mL ENTEROCOCCUS FAECALIS  SARS CORONAVIRUS 2 (TAT 6-24 HRS) Nasopharyngeal Nasopharyngeal Swab     Status: None   Collection Time: 11/13/20  8:36 PM   Specimen: Nasopharyngeal Swab  Result Value Ref Range Status   SARS Coronavirus 2 NEGATIVE NEGATIVE Final    Comment: (NOTE) SARS-CoV-2 target nucleic acids are NOT DETECTED.  The SARS-CoV-2 RNA is generally detectable in upper and lower respiratory specimens during the acute phase of infection. Negative results do not preclude SARS-CoV-2 infection, do not rule out co-infections with other pathogens, and should not be  used as the sole basis for treatment or other patient management decisions. Negative results must be combined with clinical observations, patient history, and epidemiological information. The expected result is Negative.  Fact Sheet for Patients: HairSlick.no  Fact Sheet for Healthcare Providers: quierodirigir.com  This test is not yet approved or cleared by the [place] FDA and  has been authorized for detection and/or diagnosis of SARS-CoV-2 by FDA under an Emergency Use Authorization (EUA). This EUA will remain  in effect (meaning this test can be used) for the duration of the COVID-19 declaration under Se ction 564(b)(1) of the Act, 21 U.S.C. section 360bbb-3(b)(1), unless the authorization is terminated or revoked sooner.  Performed at [facility], [address] [place], [place] [address]          Radiology Studies: No results found.      Scheduled Meds:  enoxaparin (LOVENOX) injection  0.5 mg/kg Subcutaneous Q24H   lacosamide  50 mg Oral BID   lamoTRIgine  25 mg Oral BID    levETIRAcetam  1,500 mg Oral BID   loratadine  10 mg Oral Daily   mouth rinse  15 mL Mouth Rinse 10 times per Mariah   multivitamin with minerals  1 tablet Oral Daily   pantoprazole  40 mg Oral Daily   phenobarbital  32.4 mg Oral TID   polyethylene glycol  17 g Oral Daily   Vitamin D (Ergocalciferol)  50,000 Units Oral Q7 days   Continuous Infusions:    Assessment & Plan:   Principal Problem:   Acute pyelonephritis Active Problems:   Seizure disorder (HCC)   Intellectual disability   Acute urinary retention   Acute repetitive seizure (HCC)   Acute pyelonephritis- ruled out Patient has pyuria 8/8 was getting Rocephin, was discontinued and was given fosfomycin due to urine cultures being positive for both organisms  Urine culture with Aerococcus urinae. And Enter. Fac.     Acute urinary retention Foley catheter was placed in the ER with drainage of about a liter of urine 8/8 continue Foley catheter for 1 week with follow-up with urology for voiding trial      Seizure disorder, cerebral palsy, continue supportive care Patient is on multiple antiepileptic medications which include Keppra, Lamictal, Vimpat As needed Ativan for breakthrough seizures Place patient on seizure precautions Phenytoin level elevated, neurology consulted, p.m. dose of discontinued on 11/14/2020 8/8 phenytoin level increased from 26-30  Spoke to pharmacy, will continue to hold level  Neurology followed -recommended Dilantin target level of 10-20  Continue current doses of other AEDs including Keppra, Lamictal, Vimpat, phenobarbital  Will need to follow-up with outpatient neurology with Dr. Malvin Johns     Left adrenal mass Stable 2.7 cm left adrenal mass, possibly adenoma. 8/8 continue follow-up with PCP as outpatient may need monitoring      Vitamin D deficiency, started vitamin D 50k units p.o. weekly.   Body mass index is 43.64 kg/m. Interventions:   Pressure Injury 10/04/19 Sacrum Right  Stage 2 -  Partial thickness loss of dermis presenting as a shallow open injury with a red, pink wound bed without slough. (Active)  10/04/19 2200  Location: Sacrum  Location Orientation: Right  Staging: Stage 2 -  Partial thickness loss of dermis presenting as a shallow open injury with a red, pink wound bed without slough.  Wound Description (Comments):  Present on Admission: Yes        DVT prophylaxis: Lovenox Code Status: DNR Family Communication: None at bedside Disposition  Plan:  Status is: Inpatient  Remains inpatient appropriate because:Inpatient level of care appropriate due to severity of illness  Dispo: The patient is from: ALF              Anticipated d/c is to: ALF              Patient currently is not medically stable to d/c.   Difficult to place patient No            LOS: 4 days   Time spent: 35 minutes with more than 50% on COC    Lynn ItoSahar Andrey Hoobler, MD [facility] Pager [phone]  If 7PM-7AM, please contact night-coverage 11/17/2020, 8:15 AM

## 2020-11-17 NOTE — Consult Note (Signed)
MEDICATION RELATED CONSULT NOTE  Pharmacy Consult for Phenytoin dosing  Indication: Seizures   No Known Allergies  Patient Measurements: Height: 5\' 8"  (172.7 cm) Weight: 122.8 kg (270 lb 12.8 oz) IBW/kg (Calculated) : 63.9 Adjusted Body Weight: 90.4 kg    Labs: Recent Labs    11/15/20 0627 11/16/20 0603 11/16/20 0642 11/17/20 0612  WBC 8.4  --  7.9 7.7  HGB 11.3*  --  10.9* 10.4*  HCT 34.1*  --  33.9* 32.4*  PLT 260  --  267 256  CREATININE 0.53 0.55  --  0.38*  MG 1.9 1.9  --  2.0  PHOS 2.7 3.0  --  2.8    Estimated Creatinine Clearance: 86.5 mL/min (A) (by C-G formula based on SCr of 0.38 mg/dL (L)).   Medical History: Past Medical History:  Diagnosis Date   Mental retardation    Seizures (HCC)     Medications:    Assessment: 75 y.o. female with medical history significant for mental retardation, morbid obesity and seizures who was brought into the ER on 8/3 from [facility] for evaluation of multiple seizures. Found to have elevated phenytoin level on admission. Pharmacy has been consulted to adjust phenytoin dose.  Drug-Drug interactions identified: Omeprazole - Phenytoin: Omeprazole ( Weak CYP2C19 inhibitor) may increase the serum concentration of phenytoin  Diazepam- Phenytoin -- Diazepam (CYY2C19 substrate) may increase serum concentrations of phenytoin  PTA dose: phenytoin ER capsule 200 mg daily + 400 mg ER capsule q HS (per nursing home administration guide)  *Per last Neurology note from Dr. 10/3 6/27, patient was to be on phenytoin 400 mg ER qhs in addition to her other antiepileptic medications, including new start of lamotrigine. It is unclear when/who increased the dose.*   8/4 1241 Phe, Free = 3.0, Total = 31.3 Last doses: 8/4 1149 200 mg ; 2324 400 mg  8/5 1046 200 mg 8/6 0904 200 mg   8/7 0603 Phe, total = 26 8/8 0612 Phe, total = 30.1  Goal of Therapy:  Total phenytoin level = 10-20 mg/L, Free, level = 1-2 mg/L  Plan:   Phenytoin level remains elevated; doses on hold Repeat total level with AM labs Once total level < 20 mg/L, plan to restart phenytoin 400 mg ER at bedtime (which is the dose patient was supposed to be on per her last visit with Neurology - Dr. 10/7) Plan discussed with Dr. Malvin Johns, PharmD, BCPS Clinical Pharmacist   11/17/2020,7:49 AM

## 2020-11-17 NOTE — Progress Notes (Signed)
PHARMACIST - PHYSICIAN COMMUNICATION  DR:   Marylu Lund  CONCERNING: IV to Oral Route Change Policy  RECOMMENDATION: This patient is receiving keppra by the intravenous route.  Based on criteria approved by the Pharmacy and Therapeutics Committee, the intravenous medication(s) is/are being converted to the equivalent oral dose form(s).   DESCRIPTION: These criteria include: The patient is eating (either orally or via tube) and/or has been taking other orally administered medications for a least 24 hours The patient has no evidence of active gastrointestinal bleeding or impaired GI absorption (gastrectomy, short bowel, patient on TNA or NPO).  If you have questions about this conversion, please contact the Pharmacy Department  []   [phone] )  ( [phone] [x]   [phone] )  [facility] []   [phone] )  [facility] []   [phone] )  [facility] []   [phone] )  [facility]   ( [phone], PharmD, BCPS Clinical Pharmacist 11/17/2020 7:57 AM

## 2020-11-18 DIAGNOSIS — R569 Unspecified convulsions: Secondary | ICD-10-CM | POA: Diagnosis not present

## 2020-11-18 DIAGNOSIS — G40909 Epilepsy, unspecified, not intractable, without status epilepticus: Secondary | ICD-10-CM | POA: Diagnosis not present

## 2020-11-18 LAB — CBC
HCT: 31.7 % — ABNORMAL LOW (ref 36.0–46.0)
Hemoglobin: 10.4 g/dL — ABNORMAL LOW (ref 12.0–15.0)
MCH: 29.9 pg (ref 26.0–34.0)
MCHC: 32.8 g/dL (ref 30.0–36.0)
MCV: 91.1 fL (ref 80.0–100.0)
Platelets: 240 10*3/uL (ref 150–400)
RBC: 3.48 MIL/uL — ABNORMAL LOW (ref 3.87–5.11)
RDW: 15.6 % — ABNORMAL HIGH (ref 11.5–15.5)
WBC: 7.8 10*3/uL (ref 4.0–10.5)
nRBC: 0 % (ref 0.0–0.2)

## 2020-11-18 LAB — BASIC METABOLIC PANEL
Anion gap: 6 (ref 5–15)
BUN: 20 mg/dL (ref 8–23)
CO2: 28 mmol/L (ref 22–32)
Calcium: 9 mg/dL (ref 8.9–10.3)
Chloride: 105 mmol/L (ref 98–111)
Creatinine, Ser: 0.52 mg/dL (ref 0.44–1.00)
GFR, Estimated: >60 mL/min (ref >60)
Glucose, Bld: 100 mg/dL — ABNORMAL HIGH (ref 70–99)
Potassium: 4.4 mmol/L (ref 3.5–5.1)
Sodium: 139 mmol/L (ref 135–145)

## 2020-11-18 LAB — PHENYTOIN LEVEL, TOTAL: Phenytoin Lvl: 25.7 ug/mL — ABNORMAL HIGH (ref 10.0–20.0)

## 2020-11-18 MED ORDER — DIPHENHYDRAMINE HCL 25 MG PO CAPS
25.0000 mg | ORAL_CAPSULE | Freq: Four times a day (QID) | ORAL | Status: DC | PRN
  Administered 2020-11-18: 25 mg via ORAL
  Filled 2020-11-18: qty 1

## 2020-11-18 NOTE — Consult Note (Signed)
MEDICATION RELATED CONSULT NOTE  Pharmacy Consult for Phenytoin dosing  Indication: Seizures   No Known Allergies  Patient Measurements: Height: 5\' 8"  (172.7 cm) Weight: 122.8 kg (270 lb 12.8 oz) IBW/kg (Calculated) : 63.9 Adjusted Body Weight: 90.4 kg    Labs: Recent Labs    11/16/20 0603 11/16/20 0642 11/17/20 0612 11/18/20 0520  WBC  --  7.9 7.7 7.8  HGB  --  10.9* 10.4* 10.4*  HCT  --  33.9* 32.4* 31.7*  PLT  --  267 256 240  CREATININE 0.55  --  0.38* 0.52  MG 1.9  --  2.0  --   PHOS 3.0  --  2.8  --     Estimated Creatinine Clearance: 86.5 mL/min (by C-G formula based on SCr of 0.52 mg/dL).   Medical History: Past Medical History:  Diagnosis Date   Mental retardation    Seizures (HCC)     Medications:    Assessment: 75 y.o. female with medical history significant for mental retardation, morbid obesity and seizures who was brought into the ER on 8/3 from [facility] for evaluation of multiple seizures. Found to have elevated phenytoin level on admission. Pharmacy has been consulted to adjust phenytoin dose.  Drug-Drug interactions identified: Omeprazole - Phenytoin: Omeprazole ( Weak CYP2C19 inhibitor) may increase the serum concentration of phenytoin  Diazepam- Phenytoin -- Diazepam (CYY2C19 substrate) may increase serum concentrations of phenytoin  PTA dose: phenytoin ER capsule 200 mg daily + 400 mg ER capsule q HS (per nursing home administration guide)  *Per last Neurology note from Dr. 10/3 6/27, patient was to be on phenytoin 400 mg ER qhs in addition to her other antiepileptic medications, including new start of lamotrigine. It is unclear when/who increased the dose.*   8/4 1241 Phe, Free = 3.0, Total = 31.3 Last doses: 8/4 1149 200 mg ; 2324 400 mg  8/5 1046 200 mg 8/6 0904 200 mg   8/7 0603 Phe, total = 26 8/8 0612 Phe, total = 30.1 8/9 0520 Phe, total = 25.7  Goal of Therapy:  Total phenytoin level = 10-20 mg/L, Free, level =  1-2 mg/L  Plan:  Phenytoin level remains elevated; doses on hold Ordered repeat total level with AM labs Once total level < 20 mg/L, plan to restart phenytoin 400 mg ER at bedtime (which is the dose patient was supposed to be on per her last visit with Neurology - Dr. 10/7) Plan discussed with Dr. Malvin Johns, PharmD, [facility] Clinical Pharmacist   11/18/2020,8:00 AM

## 2020-11-18 NOTE — Progress Notes (Signed)
PROGRESS NOTE    Mariah Jimenez  RDE:[id number] DOB: [birth year] DOA: 11/12/2020 PCP: Keane Police, MD    Brief Narrative:  Mariah Jimenez is a 75 y.o. female with medical history significant for mental retardation, morbid obesity and seizures who was brought into the ER from [facility] for evaluation of multiple seizures. Per nursing home staff patient had a focal seizure at around 6:24 PM and received a dose of IM Ativan 2 mg.  She was noted to be hypotensive by EMS with blood pressure of 83/41 and received a 500 cc fluid bolus prior to arriving to the emergency room. Patient's sister who was at the bedside was concerned about abdominal distention and patient was noted to have suprapubic tenderness with bladder distention.  Foley catheter was inserted with drainage of about 1 L of urine.  CT scan done in the ER was also consistent with urinary retention and pyelonephritis and patient received a dose of IV Rocephin. Labs show white count 12.3 >>10.1, hemoglobin 11.5, hematocrit 35.5, MCV 89.6, RDW 15.2, platelet count 267, sodium 138, potassium 3.2, chloride 102, bicarb 25, glucose 196, BUN 10, creatinine 0.6, calcium 8.9, alkaline phosphatase 215, albumin 3.0, AST 26, ALT 23, total protein 7.6 Chest x-ray reviewed by me shows cardiomegaly with vascular congestion.  No focal consolidation. CT scan of the head without contrast shows no evidence of acute intracranial abnormality.  Chronic cerebral atrophy.  Left mastoid effusion. CT scan of abdomen and pelvis with contrast shows mild focal air distension of the sigmoid colon which is visible in the right hemiabdomen. Narrowed appearing segments of colon upstream and downstream to the dilated segment with collapsed rectosigmoid colon, raising possibility of obstructed segment of sigmoid colon. There is no evidence for small bowel obstruction. Mild left hydronephrosis and hydroureter without obstructing stone. Mild urothelial  enhancement on the left, question ascending urinary tract infection. There is marked bladder distension. Stable 2.7 cm left adrenal mass, possibly adenoma. Twelve-lead EKG reviewed by me shows sinus rhythm with low voltage QRS.   From 8/7-8/9 Waiting for phenytoin levels to get to goal 10-20. Yesterday actually went up, pharmacy held dose. Pharm, managing dosing. Otherwise no other issues. When level at goal, likely can dc .  8/8-phenytoin level actually increased to 30.1 .goal 10-20.  Per nursing patient's p.o. intake is good.  Discussed level with pharmacy they   8/9-phenytoin level 25.7 today no overnight issues  Consultants:  Neurology  Procedures:   Antimicrobials:      Subjective: Patient is sleepy this AM.  Objective: Vitals:   11/17/20 2120 11/18/20 0353 11/18/20 0722 11/18/20 1217  BP: 112/77 (!) 145/62 (!) 147/57 (!) 117/44  Pulse: 88 82 88 91  Resp: 20 16 18 19   Temp: 97.9 F (36.6 C) 98.2 F (36.8 C) 98.1 F (36.7 C) 97.9 F (36.6 C)  TempSrc:  Oral    SpO2: 100% 100% 100% 100%  Weight:      Height:        Intake/Output Summary (Last 24 hours) at 11/18/2020 1428 Last data filed at 11/18/2020 1335 Gross per 24 hour  Intake 720 ml  Output 1900 ml  Net -1180 ml   Filed Weights   11/12/20 2009 11/17/20 0320  Weight: 130.2 kg 122.8 kg    Examination: Calm, sleepy, NAD CTA anteriorly no wheezing Distant heart sounds regular S1-S2 Soft benign positive bowel sounds No edema Mood and affect appropriate in current setting    Data Reviewed: I have personally reviewed following labs  and imaging studies  CBC: Recent Labs  Lab 11/12/20 2145 11/13/20 0924 11/14/20 0950 11/15/20 0627 11/16/20 0642 11/17/20 0612 11/18/20 0520  WBC 12.5*   < > 7.0 8.4 7.9 7.7 7.8  NEUTROABS 9.1*  --   --   --   --   --   --   HGB 10.3*   < > 10.9* 11.3* 10.9* 10.4* 10.4*  HCT 31.4*   < > 34.5* 34.1* 33.9* 32.4* 31.7*  MCV 91.0   < > 93.2 90.0 89.9 88.8 91.1  PLT  256   < > 232 260 267 256 240   < > = values in this interval not displayed.   Basic Metabolic Panel: Recent Labs  Lab 11/14/20 0950 11/15/20 0627 11/16/20 0603 11/17/20 0612 11/18/20 0520  NA 139 142 138 142 139  K 4.0 4.3 4.7 3.7 4.4  CL 107 106 104 105 105  CO2 26 28 23 28 28   GLUCOSE 100* 100* 88 104* 100*  BUN 11 14 15 15 20   CREATININE 0.48 0.53 0.55 0.38* 0.52  CALCIUM 8.7* 8.4* 8.5* 9.1 9.0  MG 2.0 1.9 1.9 2.0  --   PHOS 3.1 2.7 3.0 2.8  --    GFR: Estimated Creatinine Clearance: 86.5 mL/min (by C-G formula based on SCr of 0.52 mg/dL). Liver Function Tests: Recent Labs  Lab 11/12/20 2044  AST 26  ALT 23  ALKPHOS 215*  BILITOT 0.5  PROT 7.6  ALBUMIN 3.0*   No results for input(s): LIPASE, AMYLASE in the last 168 hours. No results for input(s): AMMONIA in the last 168 hours. Coagulation Profile: No results for input(s): INR, PROTIME in the last 168 hours. Cardiac Enzymes: No results for input(s): CKTOTAL, CKMB, CKMBINDEX, TROPONINI in the last 168 hours. BNP (last 3 results) No results for input(s): PROBNP in the last 8760 hours. HbA1C: No results for input(s): HGBA1C in the last 72 hours. CBG: Recent Labs  Lab 11/15/20 1143 11/15/20 1609  GLUCAP 120* 124*   Lipid Profile: No results for input(s): CHOL, HDL, LDLCALC, TRIG, CHOLHDL, LDLDIRECT in the last 72 hours. Thyroid Function Tests: No results for input(s): TSH, T4TOTAL, FREET4, T3FREE, THYROIDAB in the last 72 hours. Anemia Panel: No results for input(s): VITAMINB12, FOLATE, FERRITIN, TIBC, IRON, RETICCTPCT in the last 72 hours.  Sepsis Labs: No results for input(s): PROCALCITON, LATICACIDVEN in the last 168 hours.  Recent Results (from the past 240 hour(s))  Urine Culture     Status: Abnormal   Collection Time: 11/13/20  1:57 AM   Specimen: Urine, Catheterized  Result Value Ref Range Status   Specimen Description   Final    URINE, CATHETERIZED Performed at [facility], [address] [place], [place] [address]    Special Requests   Final    NONE Performed at [facility], [address] [place], [place] [address]    Culture (A)  Final    >=100,000 COLONIES/mL AEROCOCCUS URINAE Standardized susceptibility testing for this organism is not available. 40,000 COLONIES/mL ENTEROCOCCUS FAECALIS    Report Status 11/16/2020 FINAL  Final   Organism ID, Bacteria ENTEROCOCCUS FAECALIS (A)  Final      Susceptibility   Enterococcus faecalis - MIC*    AMPICILLIN <=2 SENSITIVE Sensitive     NITROFURANTOIN <=16 SENSITIVE Sensitive     VANCOMYCIN 1 SENSITIVE Sensitive     * 40,000 COLONIES/mL ENTEROCOCCUS FAECALIS  SARS CORONAVIRUS 2 (TAT 6-24 HRS) Nasopharyngeal Nasopharyngeal Swab     Status: None  Collection Time: 11/13/20  8:36 PM   Specimen: Nasopharyngeal Swab  Result Value Ref Range Status   SARS Coronavirus 2 NEGATIVE NEGATIVE Final    Comment: (NOTE) SARS-CoV-2 target nucleic acids are NOT DETECTED.  The SARS-CoV-2 RNA is generally detectable in upper and lower respiratory specimens during the acute phase of infection. Negative results do not preclude SARS-CoV-2 infection, do not rule out co-infections with other pathogens, and should not be used as the sole basis for treatment or other patient management decisions. Negative results must be combined with clinical observations, patient history, and epidemiological information. The expected result is Negative.  Fact Sheet for Patients: HairSlick.no  Fact Sheet for Healthcare Providers: quierodirigir.com  This test is not yet approved or cleared by the [place] FDA and  has been authorized for detection and/or diagnosis of SARS-CoV-2 by FDA under an Emergency Use Authorization (EUA). This EUA will remain  in effect (meaning this test can be used) for the duration of the COVID-19 declaration under Se ction 564(b)(1) of the Act, 21  U.S.C. section 360bbb-3(b)(1), unless the authorization is terminated or revoked sooner.  Performed at [facility], [address] [place], [place] [address]          Radiology Studies: No results found.      Scheduled Meds:  enoxaparin (LOVENOX) injection  0.5 mg/kg Subcutaneous Q24H   lacosamide  50 mg Oral BID   lamoTRIgine  25 mg Oral BID   levETIRAcetam  1,500 mg Oral BID   loratadine  10 mg Oral Daily   mouth rinse  15 mL Mouth Rinse 10 times per day   multivitamin with minerals  1 tablet Oral Daily   pantoprazole  40 mg Oral Daily   phenobarbital  32.4 mg Oral TID   polyethylene glycol  17 g Oral Daily   Vitamin D (Ergocalciferol)  50,000 Units Oral Q7 days   Continuous Infusions:    Assessment & Plan:   Principal Problem:   Acute pyelonephritis Active Problems:   Seizure disorder (HCC)   Intellectual disability   Acute urinary retention   Acute repetitive seizure (HCC)   Acute pyelonephritis- ruled out Patient had pyuria Was given Rocephin but discontinued and given 1 dose of fosfomycin due to urine culture being positive for both organisms Urine culture with Aerococcus urin. And enter. Fac. No evidence of pyelo.    Acute urinary retention Foley catheter was placed in the ER with drainage of about a liter of urine 8/9 continue Foley catheter for 1 week with follow-up with urology for voiding trial      Seizure disorder, cerebral palsy, continue supportive care Patient is on multiple antiepileptic medications which include Keppra, Lamictal, Vimpat As needed Ativan for breakthrough seizures Place patient on seizure precautions Phenytoin level elevated, neurology consulted, p.m. dose of discontinued on 11/14/2020 8/9 phenytoin level had increased  26>>30>>25.7 Pharmacy managing dosing.  Dose has been held.  Would likely need to hold dose today  Neurology followed -recommended Dilantin target level of 10-20  Continue current doses of  other AEDs including Keppra, Lamictal, Vimpat, phenobarbital  Will need to follow-up with outpatient neurology Dr. Malvin Johns      Left adrenal mass Stable 2.7 cm left adrenal mass, possibly adenoma. 8/9 continue follow-up with PCP as outpatient may be needed for monitoring        Vitamin D deficiency, started vitamin D 50k units p.o. weekly.   Body mass index is 43.64 kg/m. Interventions:   Pressure Injury  10/04/19 Sacrum Right Stage 2 -  Partial thickness loss of dermis presenting as a shallow open injury with a red, pink wound bed without slough. (Active)  10/04/19 2200  Location: Sacrum  Location Orientation: Right  Staging: Stage 2 -  Partial thickness loss of dermis presenting as a shallow open injury with a red, pink wound bed without slough.  Wound Description (Comments):  Present on Admission: Yes        DVT prophylaxis: Lovenox Code Status: DNR Family Communication: None at bedside Disposition Plan:  Status is: Inpatient  Remains inpatient appropriate because:Inpatient level of care appropriate due to severity of illness  Dispo: The patient is from: ALF              Anticipated d/c is to: ALF              Patient currently is not medically stable to d/c.   Difficult to place patient No            LOS: 5 days   Time spent: 35 minutes with more than 50% on COC    Lynn Ito, MD [facility] Pager [phone]  If 7PM-7AM, please contact night-coverage 11/18/2020, 2:28 PM

## 2020-11-18 NOTE — TOC Progression Note (Signed)
Transition of Care [facility]) - Progression Note    Patient Details  Name: Mariah Jimenez MRN: [id number] Date of Birth: [birth year]  Transition of Care [facility]) CM/SW Contact  Gildardo Griffes, [place] Phone Number: 11/18/2020, 9:34 AM  Clinical Narrative:     CSW received call from Rickey at [facility]. He was checking on patient's status, CSW informed still waiting on patient's phenytoin level to stabilize. Will continue to update on medical statues when ready to return to [facility].   Expected Discharge Plan: Skilled Nursing Facility Barriers to Discharge: Continued Medical Work up  Expected Discharge Plan and Services Expected Discharge Plan: Skilled Nursing Facility       Living arrangements for the past 2 months: Skilled Nursing Facility Physiological scientist)                                       Social Determinants of Health (SDOH) Interventions    Readmission Risk Interventions No flowsheet data found.

## 2020-11-19 DIAGNOSIS — G40909 Epilepsy, unspecified, not intractable, without status epilepticus: Secondary | ICD-10-CM | POA: Diagnosis not present

## 2020-11-19 DIAGNOSIS — R569 Unspecified convulsions: Secondary | ICD-10-CM | POA: Diagnosis not present

## 2020-11-19 LAB — CBC
HCT: 32.2 % — ABNORMAL LOW (ref 36.0–46.0)
Hemoglobin: 10.8 g/dL — ABNORMAL LOW (ref 12.0–15.0)
MCH: 30.3 pg (ref 26.0–34.0)
MCHC: 33.5 g/dL (ref 30.0–36.0)
MCV: 90.4 fL (ref 80.0–100.0)
Platelets: 239 10*3/uL (ref 150–400)
RBC: 3.56 MIL/uL — ABNORMAL LOW (ref 3.87–5.11)
RDW: 15.7 % — ABNORMAL HIGH (ref 11.5–15.5)
WBC: 7.9 10*3/uL (ref 4.0–10.5)
nRBC: 0 % (ref 0.0–0.2)

## 2020-11-19 LAB — BASIC METABOLIC PANEL
Anion gap: 7 (ref 5–15)
BUN: 17 mg/dL (ref 8–23)
CO2: 29 mmol/L (ref 22–32)
Calcium: 8.9 mg/dL (ref 8.9–10.3)
Chloride: 103 mmol/L (ref 98–111)
Creatinine, Ser: 0.5 mg/dL (ref 0.44–1.00)
GFR, Estimated: >60 mL/min (ref >60)
Glucose, Bld: 107 mg/dL — ABNORMAL HIGH (ref 70–99)
Potassium: 4.7 mmol/L (ref 3.5–5.1)
Sodium: 139 mmol/L (ref 135–145)

## 2020-11-19 LAB — PHENYTOIN LEVEL, TOTAL: Phenytoin Lvl: 24.2 ug/mL — ABNORMAL HIGH (ref 10.0–20.0)

## 2020-11-19 NOTE — Progress Notes (Signed)
Report given to Angie, Nurse assuming care of patient on 1C. All outstanding questions resolved.

## 2020-11-19 NOTE — Progress Notes (Signed)
Tap Water Enema given per MD order. Pt tolerated fairly well.

## 2020-11-19 NOTE — Progress Notes (Addendum)
Spoke with Dr. Pola Corn, concerned for abdominal distention and no BM for a week.  I reviewed CT on admission, and find no remarkable thickening/mass of the recto-sigmoid and an abrupt transition of colonic luminal volume.   Advised utilization of water soluble contrast enema to both evaluate for mechanical obstruction and perhaps assist with bowel activity.   Enemas this evening hopefully will help.   Will follow with full consult, if necessary.

## 2020-11-19 NOTE — Consult Note (Signed)
Phenytoin Drug-Level Monitoring CONSULT NOTE  Pharmacy Consult for Phenytoin dosing  Indication: Seizures   No Known Allergies  Patient Measurements: Height: 5\' 8"  (172.7 cm) Weight: 122.8 kg (270 lb 12.8 oz) IBW/kg (Calculated) : 63.9 Adjusted Body Weight: 90.4 kg    Labs: Recent Labs    11/17/20 0612 11/18/20 0520 11/19/20 0516  WBC 7.7 7.8 7.9  HGB 10.4* 10.4* 10.8*  HCT 32.4* 31.7* 32.2*  PLT 256 240 239  CREATININE 0.38* 0.52 0.50  MG 2.0  --   --   PHOS 2.8  --   --     Estimated Creatinine Clearance: 86.5 mL/min (by C-G formula based on SCr of 0.5 mg/dL).   Medical History: Past Medical History:  Diagnosis Date   Mental retardation    Seizures (HCC)     Medications:    Assessment: 75 y.o. female with medical history significant for mental retardation, morbid obesity and seizures who was brought into the ER on 8/3 from [facility] for evaluation of multiple seizures. Found to have elevated phenytoin level on admission. Pharmacy has been consulted to adjust phenytoin dose.  Drug-Drug interactions identified: Omeprazole - Phenytoin: Omeprazole ( Weak CYP2C19 inhibitor) may increase the serum concentration of phenytoin  Diazepam- Phenytoin -- Diazepam (CYY2C19 substrate) may increase serum concentrations of phenytoin  PTA dose: phenytoin ER capsule 200 mg daily + 400 mg ER capsule q HS (per nursing home administration guide)  *Per last Neurology note from Dr. 10/3 6/27, patient was to be on phenytoin 400 mg ER qhs in addition to her other antiepileptic medications, including new start of lamotrigine. It is unclear when/who increased the dose.*   8/4 1241 Phe, Free = 3.0, Total = 31.3 Last doses: 8/4 1149 200 mg ; 2324 400 mg  8/5 1046 200 mg 8/6 0904 200 mg   8/7 0603 Phe, total = 26 8/8 0612 Phe, total = 30.1 8/9 0520 Phe, total = 25.7 (delta 4.4) 8/10 0516 Phe, total = 24.2 (delta 1.5)  Goal of Therapy:  Total phenytoin level = 10-20  mg/L, Free, level = 1-2 mg/L  Plan:  Phenytoin level remains elevated; doses on hold Ordered repeat total level with AM labs Once total level < 20 mg/L, plan to restart phenytoin 400 mg ER at bedtime (which is the dose patient was supposed to be on per her last visit with Neurology - Dr. 10/7) Plan discussed with Dr. Malvin Johns, PharmD, [facility] Clinical Pharmacist   11/19/2020,7:29 AM

## 2020-11-19 NOTE — Plan of Care (Signed)
  Problem: Education: Goal: Knowledge of General Education information will improve Description: Including pain rating scale, medication(s)/side effects and non-pharmacologic comfort measures 11/19/2020 1226 by Ansel Bong, RN Outcome: Progressing 11/19/2020 1225 by Ansel Bong, RN Outcome: Progressing   Problem: Health Behavior/Discharge Planning: Goal: Ability to manage health-related needs will improve 11/19/2020 1226 by Ansel Bong, RN Outcome: Progressing 11/19/2020 1225 by Ansel Bong, RN Outcome: Progressing   Problem: Clinical Measurements: Goal: Ability to maintain clinical measurements within normal limits will improve 11/19/2020 1226 by Ansel Bong, RN Outcome: Progressing 11/19/2020 1225 by Ansel Bong, RN Outcome: Progressing Goal: Will remain free from infection 11/19/2020 1226 by Ansel Bong, RN Outcome: Progressing 11/19/2020 1225 by Ansel Bong, RN Outcome: Progressing Goal: Diagnostic test results will improve 11/19/2020 1226 by Ansel Bong, RN Outcome: Progressing 11/19/2020 1225 by Ansel Bong, RN Outcome: Progressing Goal: Respiratory complications will improve 11/19/2020 1226 by Ansel Bong, RN Outcome: Progressing 11/19/2020 1225 by Ansel Bong, RN Outcome: Progressing Goal: Cardiovascular complication will be avoided 11/19/2020 1226 by Ansel Bong, RN Outcome: Progressing 11/19/2020 1225 by Ansel Bong, RN Outcome: Progressing   Problem: Activity: Goal: Risk for activity intolerance will decrease 11/19/2020 1226 by Ansel Bong, RN Outcome: Progressing 11/19/2020 1225 by Ansel Bong, RN Outcome: Progressing   Problem: Nutrition: Goal: Adequate nutrition will be maintained 11/19/2020 1226 by Ansel Bong, RN Outcome: Progressing 11/19/2020 1225 by Ansel Bong, RN Outcome: Progressing   Problem: Coping: Goal: Level of anxiety will decrease 11/19/2020 1226 by Ansel Bong, RN Outcome:  Progressing 11/19/2020 1225 by Ansel Bong, RN Outcome: Progressing   Problem: Elimination: Goal: Will not experience complications related to bowel motility 11/19/2020 1226 by Ansel Bong, RN Outcome: Progressing 11/19/2020 1225 by Ansel Bong, RN Outcome: Progressing Goal: Will not experience complications related to urinary retention 11/19/2020 1226 by Ansel Bong, RN Outcome: Progressing 11/19/2020 1225 by Ansel Bong, RN Outcome: Progressing   Problem: Pain Managment: Goal: General experience of comfort will improve 11/19/2020 1226 by Ansel Bong, RN Outcome: Progressing 11/19/2020 1225 by Ansel Bong, RN Outcome: Progressing   Problem: Safety: Goal: Ability to remain free from injury will improve 11/19/2020 1226 by Ansel Bong, RN Outcome: Progressing 11/19/2020 1225 by Ansel Bong, RN Outcome: Progressing   Problem: Skin Integrity: Goal: Risk for impaired skin integrity will decrease 11/19/2020 1226 by Ansel Bong, RN Outcome: Progressing 11/19/2020 1225 by Ansel Bong, RN Outcome: Progressing

## 2020-11-19 NOTE — Progress Notes (Signed)
PROGRESS NOTE  Mariah Jimenez  DOB: 22-Aug-1945  PCP: Keane Police, MD YCX:[id number]  DOA: 11/12/2020  LOS: 6 days  Hospital Day: 8   Chief Complaint  Patient presents with   Seizures    Brief narrative: Mariah Jimenez is a 75 y.o. female with PMH significant for global demented delay and refractory seizures, morbid obesity who was brought into the ED on 11/12/20 from [facility] for evaluation of multiple seizures. Per nursing home staff patient had a focal seizure at around 6:24 PM and received a dose of IM Ativan 2 mg.  She was noted to be hypotensive by EMS with blood pressure of 83/41 and received a 500 cc fluid bolus prior to arriving to the emergency room.  In the ED, patient was noted to have abdominal distention with suprapubic tenderness.   CT scan of abdomen pelvis showed urinary retention, mild left hydronephrosis and hydroureter without obstructing stone.   Foley catheter was inserted and 1 L of urine was drained. Labs showed WC count to 12.3 Chest x-ray did not show any consolidation. CT scan of the head did not show any acute intracranial abnormality.  Admitted to hospitalist service. See below for details.   Subjective: Patient was seen and examined this morning. Elderly African-American female with mental Cardizem, morbid obesity, alert, awake, noted purposeless movement of extremities.  Unable to verbalize complaints.  Seems comfortable  Assessment/Plan: Focal seizure History of seizure disorder, cerebral palsy -Patient is on multiple antiepileptic at home including Keppra, Lamictal, Vimpat  UTI -Urinalysis showed pyuria, many bacteria. -Urine culture showed more than 100,000 CFU per mL of Aerococcus urinae.  Also showed 40,000 CFU per mL of Enterococcus faecalis. -1 dose of fosfomycin was given. -No evidence of pyelonephritis in CT scan.  Acute urinary retention -Foley catheter was placed in the ER with drainage of about a liter of  urine -We will plan to remove Foley catheter once her constipation resolved.    Breakthrough seizure History of refractory seizures -Patient is on multiple antiepileptic medications which include Keppra, Lamictal, Vimpat, phenytoin and phenobarbital. -Primary presentation was for witnessed focal seizure at the facility.  No recurrence of seizure in the hospital. -Phenytoin level were checked and was noted to be supratherapeutic.  Neurology consultation was obtained. -Currently phenytoin is on hold.  Level being monitored.  Target level 10-20. -Continue other AEDs. -Outpatient neurology follow-up with Dr. Malvin Johns.  Abnormal CT abdomen finding Suspected sigmoid colon obstruction -CT scan of abdomen and pelvis with contrast on the day of admission showed mild focal air distension of the sigmoid colon which is visible in the right hemiabdomen. Narrowed appearing segments of colon upstream and downstream to the dilated segment with collapsed rectosigmoid colon, raising possibility of obstructed segment of sigmoid colon. There is no evidence for small bowel obstruction. -Discussed with surgeon Dr. Claudine Mouton. Will order tapwater enema today. -If no success, we will obtain a barium/Gastrografin enema tomorrow.    Left adrenal mass -Stable 2.7 cm left adrenal mass, possibly adenoma. -Follow-up as an outpatient.    Mobility: Long-term resident of nursing home Code Status:   Code Status: DNR  Nutritional status: Body mass index is 41.17 kg/m.     Diet:  Diet Order             DIET - DYS 1 Room service appropriate? Yes; Fluid consistency: Thin  Diet effective now                  DVT prophylaxis: Lovenox  Antimicrobials: None at this time Fluid: None Consultants: Neurology, general surgery on the phone Family Communication: None at bedside  Status is: Inpatient  Remains inpatient appropriate because: Phenytoin level monitoring  Dispo: The patient is from: Long-term nursing  home resident              Anticipated d/c is to: Back to nursing home              Patient currently is not medically stable to d/c.   Difficult to place patient No     Infusions:    Scheduled Meds:  enoxaparin (LOVENOX) injection  0.5 mg/kg Subcutaneous Q24H   lacosamide  50 mg Oral BID   lamoTRIgine  25 mg Oral BID   levETIRAcetam  1,500 mg Oral BID   loratadine  10 mg Oral Daily   mouth rinse  15 mL Mouth Rinse 10 times per day   multivitamin with minerals  1 tablet Oral Daily   pantoprazole  40 mg Oral Daily   phenobarbital  32.4 mg Oral TID   polyethylene glycol  17 g Oral Daily   Vitamin D (Ergocalciferol)  50,000 Units Oral Q7 days    Antimicrobials: Anti-infectives (From admission, onward)    Start     Dose/Rate Route Frequency Ordered Stop   11/16/20 1500  fosfomycin (MONUROL) packet 3 g        3 g Oral  Once 11/16/20 1350 11/16/20 1745   11/14/20 0400  cefTRIAXone (ROCEPHIN) 1 g in sodium chloride 0.9 % 100 mL IVPB  Status:  Discontinued        1 g 200 mL/hr over 30 Minutes Intravenous Every 24 hours 11/13/20 0357 11/16/20 1350   11/13/20 0315  cefTRIAXone (ROCEPHIN) 1 g in sodium chloride 0.9 % 100 mL IVPB        1 g 200 mL/hr over 30 Minutes Intravenous  Once 11/13/20 0302 11/13/20 0337       PRN meds: acetaminophen **OR** acetaminophen, bisacodyl, bisacodyl, diphenhydrAMINE, ipratropium-albuterol, LORazepam, ondansetron **OR** ondansetron (ZOFRAN) IV   Objective: Vitals:   11/19/20 1138 11/19/20 1500  BP: 126/66 110/64  Pulse: 85 84  Resp: 17 17  Temp: 98.1 F (36.7 C) 98.1 F (36.7 C)  SpO2: 100% 100%    Intake/Output Summary (Last 24 hours) at 11/19/2020 1712 Last data filed at 11/19/2020 1500 Gross per 24 hour  Intake 600 ml  Output 1000 ml  Net -400 ml   Filed Weights   11/12/20 2009 11/17/20 0320  Weight: 130.2 kg 122.8 kg   Weight change:  Body mass index is 41.17 kg/m.   Physical Exam: General exam: Elderly African-American  female.  Nonverbal at baseline Skin: No rashes, lesions or ulcers. HEENT: Atraumatic, normocephalic, no obvious bleeding Lungs: Clear to auscultation bilaterally CVS: Regular rate and rhythm, no murmur GI/Abd soft, distended abdomen, no tenderness on my examination.  Bowel sound present CNS: Alert, awake, nonverbal, unable to communicate at baseline Psychiatry: Depressed look Extremities: No pedal edema, no calf tenderness  Data Review: I have personally reviewed the laboratory data and studies available.  Recent Labs  Lab 11/12/20 2145 11/13/20 0924 11/15/20 0627 11/16/20 0642 11/17/20 0612 11/18/20 0520 11/19/20 0516  WBC 12.5*   < > 8.4 7.9 7.7 7.8 7.9  NEUTROABS 9.1*  --   --   --   --   --   --   HGB 10.3*   < > 11.3* 10.9* 10.4* 10.4* 10.8*  HCT 31.4*   < >  34.1* 33.9* 32.4* 31.7* 32.2*  MCV 91.0   < > 90.0 89.9 88.8 91.1 90.4  PLT 256   < > 260 267 256 240 239   < > = values in this interval not displayed.   Recent Labs  Lab 11/14/20 0950 11/15/20 0627 11/16/20 0603 11/17/20 0612 11/18/20 0520 11/19/20 0516  NA 139 142 138 142 139 139  K 4.0 4.3 4.7 3.7 4.4 4.7  CL 107 106 104 105 105 103  CO2 26 28 23 28 28 29   GLUCOSE 100* 100* 88 104* 100* 107*  BUN 11 14 15 15 20 17   CREATININE 0.48 0.53 0.55 0.38* 0.52 0.50  CALCIUM 8.7* 8.4* 8.5* 9.1 9.0 8.9  MG 2.0 1.9 1.9 2.0  --   --   PHOS 3.1 2.7 3.0 2.8  --   --     F/u labs ordered Unresulted Labs (From admission, onward)     Start     Ordered   11/20/20 0500  Creatinine, serum  (enoxaparin (LOVENOX)    CrCl >/= 30 ml/min)  Weekly,   STAT (with TIMED occurrences)     Comments: while on enoxaparin therapy    11/13/20 0356   11/20/20 0500  Phenytoin level, total  Tomorrow morning,   R       Question:  Specimen collection method  Answer:  Lab=Lab collect   11/19/20 0732   11/12/20 2037  CBC with Differential  ONCE - STAT,   STAT        11/12/20 2036            Signed, 01/12/21, MD [facility] 11/19/2020

## 2020-11-20 DIAGNOSIS — G40909 Epilepsy, unspecified, not intractable, without status epilepticus: Secondary | ICD-10-CM | POA: Diagnosis not present

## 2020-11-20 DIAGNOSIS — R569 Unspecified convulsions: Secondary | ICD-10-CM | POA: Diagnosis not present

## 2020-11-20 DIAGNOSIS — K560 Paralytic ileus: Secondary | ICD-10-CM

## 2020-11-20 LAB — CREATININE, SERUM
Creatinine, Ser: 0.46 mg/dL (ref 0.44–1.00)
GFR, Estimated: >60 mL/min (ref >60)

## 2020-11-20 LAB — PHENYTOIN LEVEL, TOTAL: Phenytoin Lvl: 24.9 ug/mL — ABNORMAL HIGH (ref 10.0–20.0)

## 2020-11-20 MED ORDER — SALINE SPRAY 0.65 % NA SOLN
1.0000 | NASAL | Status: DC | PRN
  Filled 2020-11-20: qty 44

## 2020-11-20 MED ORDER — DIAZEPAM 2 MG PO TABS
2.0000 mg | ORAL_TABLET | Freq: Two times a day (BID) | ORAL | Status: DC
  Administered 2020-11-20 – 2020-11-24 (×8): 2 mg via ORAL
  Filled 2020-11-20 (×8): qty 1

## 2020-11-20 NOTE — Progress Notes (Signed)
PROGRESS NOTE  Kaleiah Kutzer  DOB: 1946-01-08  PCP: Keane Police, MD ZRA:[id number]  DOA: 11/12/2020  LOS: 7 days  Hospital Day: 9   Chief Complaint  Patient presents with   Seizures    Brief narrative: Ira Busbin is a 75 y.o. female with PMH significant for global demented delay and refractory seizures, morbid obesity who was brought into the ED on 11/12/20 from [facility] for evaluation of multiple seizures. Per nursing home staff patient had a focal seizure at around 6:24 PM and received a dose of IM Ativan 2 mg.  She was noted to be hypotensive by EMS with blood pressure of 83/41 and received a 500 cc fluid bolus prior to arriving to the emergency room.  In the ED, patient was noted to have abdominal distention with suprapubic tenderness.   CT scan of abdomen pelvis showed urinary retention, mild left hydronephrosis and hydroureter without obstructing stone.   Foley catheter was inserted and 1 L of urine was drained. Labs showed WBC count to 12.3 Chest x-ray did not show any consolidation. CT scan of the head did not show any acute intracranial abnormality.  Admitted to hospitalist service. See below for details.   Subjective: Patient was seen and examined this morning. Paged by RN around 830 this morning with her sister at bedside notified that patient was ' going into a seizure'.  RN noted patient was grunting and uncontrolled facial muscle movement in the eyes and mouth as well as uncontrolled flailing of the arms.  She gave 2 doses of Ativan IV 1 mg which controlled the seizure by the time I arrived. Patient was alert, awake, tracking fingers.  None verbal and unable to communicate at baseline.  Sister at bedside with stated that patient has seizures since the age of 15 and they know looking at her when she is about to go into seizure.  Assessment/Plan: UTI -Urinalysis showed pyuria, many bacteria. -Urine culture showed more than 100,000 CFU per mL of  Aerococcus urinae.  Also showed 40,000 CFU per mL of Enterococcus faecalis. -1 dose of fosfomycin was given. -No evidence of pyelonephritis in CT scan.  Acute urinary retention -Foley catheter was placed in the ER with drainage of about a liter of urine -Remove Foley catheter today.  She uses depends.    Breakthrough seizure History of refractory seizures -Patient is on multiple antiepileptic medications which include Keppra, Lamictal, Vimpat, phenytoin and phenobarbital. -Primary presentation was for witnessed focal seizure at the facility.  This morning, patient had another recurrence of seizure which is controlled by IV Ativan.   -Phenytoin level remains supratherapeutic despite not getting any time for several days.  Target level 10-20. -Continue other AEDs. -Outpatient neurology follow-up with Dr. Malvin Johns.  Constipation -CT scan of abdomen and pelvis with contrast on the day of admission showed mild focal air distension of the sigmoid colon which is visible in the right hemiabdomen. Narrowed appearing segments of colon upstream and downstream to the dilated segment with collapsed rectosigmoid colon, raising possibility of obstructed segment of sigmoid colon. There is no evidence for small bowel obstruction. -Discussed with surgeon Dr. Claudine Mouton.  -8/10, tapwater enema was given. Per RN, patient had successful bowel movement last night.    Left adrenal mass -Stable 2.7 cm left adrenal mass, possibly adenoma. -Follow-up as an outpatient.    Mobility: Long-term resident of nursing home Code Status:   Code Status: DNR  Nutritional status: Body mass index is 41.17 kg/m.  Diet:  Diet Order             DIET - DYS 1 Room service appropriate? Yes; Fluid consistency: Thin  Diet effective now                  DVT prophylaxis: Lovenox    Antimicrobials: None at this time Fluid: None Consultants: Neurology, general surgery informally Family Communication: Sister at  bedside  Status is: Inpatient  Remains inpatient appropriate because: Phenytoin level monitoring  Dispo: The patient is from: Long-term nursing home resident              Anticipated d/c is to: Back to nursing home              Patient currently is not medically stable to d/c.   Difficult to place patient No     Infusions:    Scheduled Meds:  enoxaparin (LOVENOX) injection  0.5 mg/kg Subcutaneous Q24H   lacosamide  50 mg Oral BID   lamoTRIgine  25 mg Oral BID   levETIRAcetam  1,500 mg Oral BID   loratadine  10 mg Oral Daily   mouth rinse  15 mL Mouth Rinse 10 times per day   multivitamin with minerals  1 tablet Oral Daily   pantoprazole  40 mg Oral Daily   phenobarbital  32.4 mg Oral TID   polyethylene glycol  17 g Oral Daily   Vitamin D (Ergocalciferol)  50,000 Units Oral Q7 days    Antimicrobials: Anti-infectives (From admission, onward)    Start     Dose/Rate Route Frequency Ordered Stop   11/16/20 1500  fosfomycin (MONUROL) packet 3 g        3 g Oral  Once 11/16/20 1350 11/16/20 1745   11/14/20 0400  cefTRIAXone (ROCEPHIN) 1 g in sodium chloride 0.9 % 100 mL IVPB  Status:  Discontinued        1 g 200 mL/hr over 30 Minutes Intravenous Every 24 hours 11/13/20 0357 11/16/20 1350   11/13/20 0315  cefTRIAXone (ROCEPHIN) 1 g in sodium chloride 0.9 % 100 mL IVPB        1 g 200 mL/hr over 30 Minutes Intravenous  Once 11/13/20 0302 11/13/20 0337       PRN meds: acetaminophen **OR** acetaminophen, bisacodyl, bisacodyl, diphenhydrAMINE, ipratropium-albuterol, LORazepam, ondansetron **OR** ondansetron (ZOFRAN) IV, sodium chloride   Objective: Vitals:   11/20/20 0436 11/20/20 0734  BP: (!) 113/49 (!) 102/54  Pulse: 90 93  Resp: 18 20  Temp: 97.7 F (36.5 C) 98.2 F (36.8 C)  SpO2: 100% 100%    Intake/Output Summary (Last 24 hours) at 11/20/2020 1024 Last data filed at 11/20/2020 0734 Gross per 24 hour  Intake 240 ml  Output 1550 ml  Net -1310 ml    Filed  Weights   11/12/20 2009 11/17/20 0320  Weight: 130.2 kg 122.8 kg   Weight change:  Body mass index is 41.17 kg/m.   Physical Exam: General exam: Elderly African-American female.  Nonverbal at baseline.  She had seizures prior to my visit. Skin: No rashes, lesions or ulcers. HEENT: Atraumatic, normocephalic, no obvious bleeding Lungs: Clear to auscultation bilaterally CVS: Regular rate and rhythm, no murmur GI/Abd soft, distended abdomen, no tenderness on my examination.  Bowel sound present CNS: Alert, awake, nonverbal, unable to communicate at baseline Psychiatry: Depressed look Extremities: No pedal edema, no calf tenderness  Data Review: I have personally reviewed the laboratory data and studies available.  Recent Labs  Lab 11/15/20  8841 11/16/20 6606 11/17/20 0612 11/18/20 0520 11/19/20 0516  WBC 8.4 7.9 7.7 7.8 7.9  HGB 11.3* 10.9* 10.4* 10.4* 10.8*  HCT 34.1* 33.9* 32.4* 31.7* 32.2*  MCV 90.0 89.9 88.8 91.1 90.4  PLT 260 267 256 240 239    Recent Labs  Lab 11/14/20 0950 11/15/20 0627 11/16/20 0603 11/17/20 0612 11/18/20 0520 11/19/20 0516 11/20/20 0613  NA 139 142 138 142 139 139  --   K 4.0 4.3 4.7 3.7 4.4 4.7  --   CL 107 106 104 105 105 103  --   CO2 26 28 23 28 28 29   --   GLUCOSE 100* 100* 88 104* 100* 107*  --   BUN 11 14 15 15 20 17   --   CREATININE 0.48 0.53 0.55 0.38* 0.52 0.50 0.46  CALCIUM 8.7* 8.4* 8.5* 9.1 9.0 8.9  --   MG 2.0 1.9 1.9 2.0  --   --   --   PHOS 3.1 2.7 3.0 2.8  --   --   --      F/u labs ordered Unresulted Labs (From admission, onward)     Start     Ordered   11/20/20 0500  Creatinine, serum  (enoxaparin (LOVENOX)    CrCl >/= 30 ml/min)  Weekly,   STAT     Comments: while on enoxaparin therapy    11/13/20 0356   11/12/20 2037  CBC with Differential  ONCE - STAT,   STAT        11/12/20 2036            Signed, 01/12/21, MD [facility] 11/20/2020

## 2020-11-20 NOTE — Progress Notes (Signed)
0830 Mariah Jimenez, pt sister at beside, requested assistance stating sister was "going into a seizure". With entry into pt room. Pt presents with grunting uncontrolled facial muscle movement in eyes and mouth. Uncontrolled flailing of the arms. Pt unable to tract noise or movement with eyes to respond to calling of name. [phone] seizure activity completely stopped. No appearance of drowsiness.

## 2020-11-20 NOTE — Consult Note (Signed)
Phenytoin Drug-Level Monitoring CONSULT NOTE  Pharmacy Consult for Phenytoin dosing  Indication: Seizures   No Known Allergies  Patient Measurements: Height: 5\' 8"  (172.7 cm) Weight: 122.8 kg (270 lb 12.8 oz) IBW/kg (Calculated) : 63.9 Adjusted Body Weight: 90.4 kg    Labs: Recent Labs    11/18/20 0520 11/19/20 0516 11/20/20 0613  WBC 7.8 7.9  --   HGB 10.4* 10.8*  --   HCT 31.7* 32.2*  --   PLT 240 239  --   CREATININE 0.52 0.50 0.46    Estimated Creatinine Clearance: 86.5 mL/min (by C-G formula based on SCr of 0.46 mg/dL).   Medical History: Past Medical History:  Diagnosis Date   Mental retardation    Seizures (HCC)     Medications:    Assessment: 75 y.o. female with medical history significant for mental retardation, morbid obesity and seizures who was brought into the ER on 8/3 from [facility] for evaluation of multiple seizures. Found to have elevated phenytoin level on admission. Pharmacy has been consulted to adjust phenytoin dose.  Drug-Drug interactions identified: Omeprazole - Phenytoin: Omeprazole ( Weak CYP2C19 inhibitor) may increase the serum concentration of phenytoin  Diazepam- Phenytoin -- Diazepam (CYY2C19 substrate) may increase serum concentrations of phenytoin  PTA dose: phenytoin ER capsule 200 mg daily + 400 mg ER capsule q HS (per nursing home administration guide)  *Per last Neurology note from Dr. 10/3 6/27, patient was to be on phenytoin 400 mg ER qhs in addition to her other antiepileptic medications, including new start of lamotrigine. It is unclear when/who increased the dose.*   8/4 1241 Phe, Free = 3.0, Total = 31.3 Last doses: 8/4 1149 200 mg ; 2324 400 mg  8/5 1046 200 mg 8/6 0904 200 mg   8/7 0603 Phe, total = 26 8/8 0612 Phe, total = 30.1 8/9 0520 Phe, total = 25.7 (delta 4.4) 8/10 0516 Phe, total = 24.2 (delta 1.5) 8/11 0613 Phe, total= 24.9 (increase by 0.7)  Goal of Therapy:  Total phenytoin level =  10-20 mg/L, Free, level = 1-2 mg/L  Plan:  Phenytoin level increased today, becoming further elevated; doses on hold Will order phenytoin level for 8/13.  Once total level < 20 mg/L, plan to restart phenytoin 400 mg ER at bedtime (which is the dose patient was supposed to be on per her last visit with Neurology - Dr. 9/13) Per note on 8/10, pharmacy discussed plan with Dr. 10/10, PharmD Pharmacy Resident  11/20/2020 1:49 PM

## 2020-11-20 NOTE — TOC Progression Note (Signed)
Transition of Care [facility]) - Progression Note    Patient Details  Name: Mariah Jimenez MRN: [id number] Date of Birth: 10-Nov-1945  [facility] CM/SW Contact  Allayne Butcher, RN Phone Number: 11/20/2020, 12:18 PM  Clinical Narrative:     Patient's phenytoin levels are still elevated and patient had a seizure this morning.  Plan is still for return to [facility] at discharge.  TOC will follow.   Expected Discharge Plan: Skilled Nursing Facility Barriers to Discharge: Continued Medical Work up  Expected Discharge Plan and Services Expected Discharge Plan: Skilled Nursing Facility       Living arrangements for the past 2 months: Skilled Nursing Facility Physiological scientist)                                       Social Determinants of Health (SDOH) Interventions    Readmission Risk Interventions No flowsheet data found.

## 2020-11-21 MED ORDER — METOPROLOL TARTRATE 5 MG/5ML IV SOLN
5.0000 mg | Freq: Once | INTRAVENOUS | Status: AC
  Administered 2020-11-21: 06:00:00 5 mg via INTRAVENOUS
  Filled 2020-11-21: qty 5

## 2020-11-21 NOTE — Progress Notes (Signed)
PROGRESS NOTE  Mariah Jimenez  DOB: 1945-12-10  PCP: Keane Police, MD PPI:[id number]  DOA: 11/12/2020  LOS: 8 days  Hospital Day: 10   Chief Complaint  Patient presents with   Seizures    Brief narrative: Mariah Jimenez is a 75 y.o. female with PMH significant for global demented delay and refractory seizures, morbid obesity who was brought into the ED on 11/12/20 from [facility] for evaluation of multiple seizures. Per nursing home staff patient had a focal seizure at around 6:24 PM and received a dose of IM Ativan 2 mg.  She was noted to be hypotensive by EMS with blood pressure of 83/41 and received a 500 cc fluid bolus prior to arriving to the emergency room.  In the ED, patient was noted to have abdominal distention with suprapubic tenderness.   CT scan of abdomen pelvis showed urinary retention, mild left hydronephrosis and hydroureter without obstructing stone.   Foley catheter was inserted and 1 L of urine was drained. Labs showed WBC count to 12.3 Chest x-ray did not show any consolidation. CT scan of the head did not show any acute intracranial abnormality.  Admitted to hospitalist service. See below for details.   Subjective: Patient was seen and examined this morning.  Lying on bed.  Not in distress alert, awake, noncommunicative which is her baseline.  No recurrence of seizure after 1 episode yesterday morning.  Assessment/Plan: Breakthrough seizure History of refractory seizures -Patient is on multiple antiepileptic medications which include Keppra, Lamictal, Vimpat, phenytoin and phenobarbital. -Primary presentation was for witnessed focal seizure at the facility. -8/7, patient had another recurrence of seizure which is controlled by IV Ativan.   -Phenytoin level remains supratherapeutic despite not getting any time for several days.  Target level 10-20.  Repeat phenytoin level tomorrow. -Continue other AEDs. -Outpatient neurology follow-up  with Dr. Malvin Johns.  UTI -Urinalysis showed pyuria, many bacteria. -Urine culture showed more than 100,000 CFU per mL of Aerococcus urinae.  Also showed 40,000 CFU per mL of Enterococcus faecalis. -1 dose of fosfomycin was given. -No evidence of pyelonephritis in CT scan.  Acute urinary retention -Foley catheter was placed in the ER with drainage of about a liter of urine -Voiding trial done on 8/11.    Constipation -8/10, successful BM with enema.   -CT scan of abdomen and pelvis with contrast on the day of admission showed dilated sigmoid colon.  8/11, discussed with surgeon Dr. Claudine Mouton.  No need of surgical intervention since patient had successful bowel movement after tapwater enemas given..    Left adrenal mass -Stable 2.7 cm left adrenal mass, possibly adenoma. -Follow-up as an outpatient.    Mobility: Long-term resident of nursing home Code Status:   Code Status: DNR  Nutritional status: Body mass index is 41.17 kg/m.     Diet:  Diet Order             DIET - DYS 1 Room service appropriate? Yes; Fluid consistency: Thin  Diet effective now                  DVT prophylaxis: Lovenox    Antimicrobials: None at this time Fluid: None Consultants: Neurology, general surgery informally Family Communication: Sister at bedside  Status is: Inpatient  Remains inpatient appropriate because: Phenytoin level monitoring to continue  Dispo: The patient is from: Long-term nursing home resident              Anticipated d/c is to: Back to nursing home  Patient currently is not medically stable to d/c.   Difficult to place patient No     Infusions:    Scheduled Meds:  diazepam  2 mg Oral BID   enoxaparin (LOVENOX) injection  0.5 mg/kg Subcutaneous Q24H   lacosamide  50 mg Oral BID   lamoTRIgine  25 mg Oral BID   levETIRAcetam  1,500 mg Oral BID   loratadine  10 mg Oral Daily   mouth rinse  15 mL Mouth Rinse 10 times per day   multivitamin with  minerals  1 tablet Oral Daily   pantoprazole  40 mg Oral Daily   phenobarbital  32.4 mg Oral TID   polyethylene glycol  17 g Oral Daily   Vitamin D (Ergocalciferol)  50,000 Units Oral Q7 days    Antimicrobials: Anti-infectives (From admission, onward)    Start     Dose/Rate Route Frequency Ordered Stop   11/16/20 1500  fosfomycin (MONUROL) packet 3 g        3 g Oral  Once 11/16/20 1350 11/16/20 1745   11/14/20 0400  cefTRIAXone (ROCEPHIN) 1 g in sodium chloride 0.9 % 100 mL IVPB  Status:  Discontinued        1 g 200 mL/hr over 30 Minutes Intravenous Every 24 hours 11/13/20 0357 11/16/20 1350   11/13/20 0315  cefTRIAXone (ROCEPHIN) 1 g in sodium chloride 0.9 % 100 mL IVPB        1 g 200 mL/hr over 30 Minutes Intravenous  Once 11/13/20 0302 11/13/20 0337       PRN meds: acetaminophen **OR** acetaminophen, bisacodyl, bisacodyl, diphenhydrAMINE, ipratropium-albuterol, LORazepam, ondansetron **OR** ondansetron (ZOFRAN) IV, sodium chloride   Objective: Vitals:   11/21/20 0730 11/21/20 1127  BP: (!) 92/45 (!) 114/54  Pulse: 94 93  Resp: 16 18  Temp: 99 F (37.2 C) 98.8 F (37.1 C)  SpO2: 99% 97%    Intake/Output Summary (Last 24 hours) at 11/21/2020 1326 Last data filed at 11/21/2020 1018 Gross per 24 hour  Intake 0 ml  Output --  Net 0 ml    Filed Weights   11/12/20 2009 11/17/20 0320  Weight: 130.2 kg 122.8 kg   Weight change:  Body mass index is 41.17 kg/m.   Physical Exam: General exam: Elderly African-American female. Nonverbal at baseline.  She had seizures prior to my visit. Skin: No rashes, lesions or ulcers. HEENT: Atraumatic, normocephalic, no obvious bleeding Lungs: Clear to auscultation bilaterally CVS: Regular rate and rhythm, no murmur GI/Abd soft, distended abdomen, no tenderness on my examination.  Bowel sound present CNS: Alert, awake, nonverbal, unable to communicate at baseline Psychiatry: Depressed look Extremities: No pedal edema, no calf  tenderness  Data Review: I have personally reviewed the laboratory data and studies available.  Recent Labs  Lab 11/15/20 0627 11/16/20 0642 11/17/20 0612 11/18/20 0520 11/19/20 0516  WBC 8.4 7.9 7.7 7.8 7.9  HGB 11.3* 10.9* 10.4* 10.4* 10.8*  HCT 34.1* 33.9* 32.4* 31.7* 32.2*  MCV 90.0 89.9 88.8 91.1 90.4  PLT 260 267 256 240 239    Recent Labs  Lab 11/15/20 0627 11/16/20 0603 11/17/20 0612 11/18/20 0520 11/19/20 0516 11/20/20 0613  NA 142 138 142 139 139  --   K 4.3 4.7 3.7 4.4 4.7  --   CL 106 104 105 105 103  --   CO2 28 23 28 28 29   --   GLUCOSE 100* 88 104* 100* 107*  --   BUN 14 15 15 20 17   --  CREATININE 0.53 0.55 0.38* 0.52 0.50 0.46  CALCIUM 8.4* 8.5* 9.1 9.0 8.9  --   MG 1.9 1.9 2.0  --   --   --   PHOS 2.7 3.0 2.8  --   --   --      F/u labs ordered Unresulted Labs (From admission, onward)     Start     Ordered   11/22/20 0500  Phenytoin level, free and total  Tomorrow morning,   R       Question:  Specimen collection method  Answer:  Lab=Lab collect   11/21/20 0818   11/22/20 0500  CBC with Differential/Platelet  Tomorrow morning,   R       Question:  Specimen collection method  Answer:  Lab=Lab collect   11/21/20 1320   11/22/20 0500  Basic metabolic panel  Tomorrow morning,   R       Question:  Specimen collection method  Answer:  Lab=Lab collect   11/21/20 1320   11/20/20 0500  Creatinine, serum  (enoxaparin (LOVENOX)    CrCl >/= 30 ml/min)  Weekly,   STAT     Comments: while on enoxaparin therapy    11/13/20 0356   11/12/20 2037  CBC with Differential  ONCE - STAT,   STAT        11/12/20 2036            Signed, Lorin Glass, MD [facility] 11/21/2020

## 2020-11-21 NOTE — Progress Notes (Signed)
   11/21/20 0535  Assess: MEWS Score  Temp 97.9 F (36.6 C)  BP (!) 161/79  Pulse Rate (!) 134  Resp 16  SpO2 95 %  O2 Device Room Air  Assess: MEWS Score  MEWS Temp 0  MEWS Systolic 0  MEWS Pulse 3  MEWS RR 0  MEWS LOC 0  MEWS Score 3  MEWS Score Color Yellow  Assess: if the MEWS score is Yellow or Red  Were vital signs taken at a resting state? Yes  Focused Assessment No change from prior assessment  Does the patient meet 2 or more of the SIRS criteria? No  MEWS guidelines implemented *See Row Information* Yes  Treat  MEWS Interventions Escalated (See documentation below)  Pain Scale PAINAD  Pain Score 0  Faces Pain Scale 0  Breathing 0  Negative Vocalization 0  Facial Expression 0  Body Language 0  Consolability 0  PAINAD Score 0  Take Vital Signs  Increase Vital Sign Frequency  Yellow: Q 2hr X 2 then Q 4hr X 2, if remains yellow, continue Q 4hrs  Escalate  MEWS: Escalate Yellow: discuss with charge nurse/RN and consider discussing with provider and RRT  Notify: Charge Nurse/RN  Name of Charge Nurse/RN Notified Jackie ,RN  Date Charge Nurse/RN Notified 11/21/20  Time Charge Nurse/RN Notified 0537  Notify: Provider  Provider Name/Title Dr. Arville Care  Date Provider Notified 11/21/20  Time Provider Notified [phone]  Notification Type  (secure chat)  Notification Reason Other (Comment) (high bp HR 135, patient sleeping)  Provider response See new orders  Date of Provider Response 11/21/20  Assess: SIRS CRITERIA  SIRS Temperature  0  SIRS Pulse 1  SIRS Respirations  0  SIRS WBC 0  SIRS Score Sum  1  Observed patient sleeping with a HR of 135 and BP 161/79. Patient was able to open eyes and follow voice. No active seizure. 5mg  IVP Lopressor was ordered and then given. HR currently is 118.

## 2020-11-22 DIAGNOSIS — R569 Unspecified convulsions: Secondary | ICD-10-CM | POA: Diagnosis not present

## 2020-11-22 DIAGNOSIS — G40909 Epilepsy, unspecified, not intractable, without status epilepticus: Secondary | ICD-10-CM | POA: Diagnosis not present

## 2020-11-22 LAB — BASIC METABOLIC PANEL
Anion gap: 10 (ref 5–15)
BUN: 25 mg/dL — ABNORMAL HIGH (ref 8–23)
CO2: 27 mmol/L (ref 22–32)
Calcium: 9 mg/dL (ref 8.9–10.3)
Chloride: 103 mmol/L (ref 98–111)
Creatinine, Ser: 0.71 mg/dL (ref 0.44–1.00)
GFR, Estimated: >60 mL/min (ref >60)
Glucose, Bld: 104 mg/dL — ABNORMAL HIGH (ref 70–99)
Potassium: 4.1 mmol/L (ref 3.5–5.1)
Sodium: 140 mmol/L (ref 135–145)

## 2020-11-22 LAB — CBC WITH DIFFERENTIAL/PLATELET
Abs Immature Granulocytes: 0.05 10*3/uL (ref 0.00–0.07)
Basophils Absolute: 0 10*3/uL (ref 0.0–0.1)
Basophils Relative: 1 %
Eosinophils Absolute: 0.3 10*3/uL (ref 0.0–0.5)
Eosinophils Relative: 4 %
HCT: 29.7 % — ABNORMAL LOW (ref 36.0–46.0)
Hemoglobin: 9.7 g/dL — ABNORMAL LOW (ref 12.0–15.0)
Immature Granulocytes: 1 %
Lymphocytes Relative: 37 %
Lymphs Abs: 3.2 10*3/uL (ref 0.7–4.0)
MCH: 28.8 pg (ref 26.0–34.0)
MCHC: 32.7 g/dL (ref 30.0–36.0)
MCV: 88.1 fL (ref 80.0–100.0)
Monocytes Absolute: 0.4 10*3/uL (ref 0.1–1.0)
Monocytes Relative: 4 %
Neutro Abs: 4.6 10*3/uL (ref 1.7–7.7)
Neutrophils Relative %: 53 %
Platelets: 275 10*3/uL (ref 150–400)
RBC: 3.37 MIL/uL — ABNORMAL LOW (ref 3.87–5.11)
RDW: 15.5 % (ref 11.5–15.5)
WBC: 8.6 10*3/uL (ref 4.0–10.5)
nRBC: 0 % (ref 0.0–0.2)

## 2020-11-22 NOTE — Progress Notes (Signed)
PROGRESS NOTE  Mariah Jimenez  DOB: [birth year]  PCP: Keane Police, MD PPJ:[id number]  DOA: 11/12/2020  LOS: 9 days  Hospital Day: 11   Chief Complaint  Patient presents with   Seizures    Brief narrative: Mariah Jimenez is a 75 y.o. female with PMH significant for global demented delay and refractory seizures, morbid obesity who was brought into the ED on 11/12/20 from [facility] for evaluation of multiple seizures. Per nursing home staff patient had a focal seizure at around 6:24 PM and received a dose of IM Ativan 2 mg.  She was noted to be hypotensive by EMS with blood pressure of 83/41 and received a 500 cc fluid bolus prior to arriving to the emergency room.  In the ED, patient was noted to have abdominal distention with suprapubic tenderness.   CT scan of abdomen pelvis showed urinary retention, mild left hydronephrosis and hydroureter without obstructing stone.   Foley catheter was inserted and 1 L of urine was drained. Labs showed WBC count to 12.3 Chest x-ray did not show any consolidation. CT scan of the head did not show any acute intracranial abnormality.  Admitted to hospitalist service. See below for details.   Subjective: Patient was seen and examined this morning. Lying on bed.  Not in distress noncommunicative at baseline.  No recurrence of seizure. Phenytoin level from this morning pending  Assessment/Plan: Breakthrough seizure History of refractory seizures -Patient is on multiple antiepileptic medications which include Keppra, Lamictal, Vimpat, phenytoin and phenobarbital. -Primary presentation was for witnessed focal seizure at the facility. -8/7, patient had another recurrence of seizure which is controlled by IV Ativan.   -Phenytoin level remains supratherapeutic despite not getting any time for several days.  Target level 10-20.  Phenytoin level from this morning pending -Continue other AEDs. -Outpatient neurology follow-up with Dr.  Malvin Johns.  UTI -Urinalysis showed pyuria, many bacteria. -Urine culture showed more than 100,000 CFU per mL of Aerococcus urinae.  Also showed 40,000 CFU per mL of Enterococcus faecalis. -1 dose of fosfomycin was given. -No evidence of pyelonephritis in CT scan.  Acute urinary retention -Foley catheter was placed in the ER with drainage of about a liter of urine -Successful voiding trial done on 8/11.    Constipation -8/10, successful BM with enema.   -CT scan of abdomen and pelvis with contrast on the day of admission showed dilated sigmoid colon.  8/11, discussed with surgeon Dr. Claudine Mouton.  No need of surgical intervention since patient had successful bowel movement after tapwater enemas given..    Left adrenal mass -Stable 2.7 cm left adrenal mass, possibly adenoma. -Follow-up as an outpatient.    Mobility: Long-term resident of nursing home Code Status:   Code Status: DNR  Nutritional status: Body mass index is 41.17 kg/m.     Diet:  Diet Order             DIET - DYS 1 Room service appropriate? Yes; Fluid consistency: Thin  Diet effective now                  DVT prophylaxis: Lovenox    Antimicrobials: None at this time Fluid: None Consultants: Neurology, general surgery informally Family Communication: Sister at bedside  Status is: Inpatient  Remains inpatient appropriate because: Phenytoin level monitoring to continue  Dispo: The patient is from: Long-term nursing home resident              Anticipated d/c is to: Back to nursing home in 1  to 2 days              Patient currently is not medically stable to d/c.   Difficult to place patient No     Infusions:    Scheduled Meds:  diazepam  2 mg Oral BID   enoxaparin (LOVENOX) injection  0.5 mg/kg Subcutaneous Q24H   lacosamide  50 mg Oral BID   lamoTRIgine  25 mg Oral BID   levETIRAcetam  1,500 mg Oral BID   loratadine  10 mg Oral Daily   mouth rinse  15 mL Mouth Rinse 10 times per day    multivitamin with minerals  1 tablet Oral Daily   pantoprazole  40 mg Oral Daily   phenobarbital  32.4 mg Oral TID   polyethylene glycol  17 g Oral Daily   Vitamin D (Ergocalciferol)  50,000 Units Oral Q7 days    Antimicrobials: Anti-infectives (From admission, onward)    Start     Dose/Rate Route Frequency Ordered Stop   11/16/20 1500  fosfomycin (MONUROL) packet 3 g        3 g Oral  Once 11/16/20 1350 11/16/20 1745   11/14/20 0400  cefTRIAXone (ROCEPHIN) 1 g in sodium chloride 0.9 % 100 mL IVPB  Status:  Discontinued        1 g 200 mL/hr over 30 Minutes Intravenous Every 24 hours 11/13/20 0357 11/16/20 1350   11/13/20 0315  cefTRIAXone (ROCEPHIN) 1 g in sodium chloride 0.9 % 100 mL IVPB        1 g 200 mL/hr over 30 Minutes Intravenous  Once 11/13/20 0302 11/13/20 0337       PRN meds: acetaminophen **OR** acetaminophen, bisacodyl, bisacodyl, diphenhydrAMINE, ipratropium-albuterol, LORazepam, ondansetron **OR** ondansetron (ZOFRAN) IV, sodium chloride   Objective: Vitals:   11/22/20 0821 11/22/20 1147  BP: (!) 135/59 113/65  Pulse: 100 99  Resp: 16 18  Temp: 98.5 F (36.9 C) 98 F (36.7 C)  SpO2: 97% 96%    Intake/Output Summary (Last 24 hours) at 11/22/2020 1443 Last data filed at 11/22/2020 1009 Gross per 24 hour  Intake 480 ml  Output --  Net 480 ml    Filed Weights   11/12/20 2009 11/17/20 0320  Weight: 130.2 kg 122.8 kg   Weight change:  Body mass index is 41.17 kg/m.   Physical Exam: General exam: Elderly African-American female. Nonverbal at baseline.  She had seizures prior to my visit. Skin: No rashes, lesions or ulcers. HEENT: Atraumatic, normocephalic, no obvious bleeding Lungs: Clear to auscultation bilaterally CVS: Regular rate and rhythm, no murmur GI/Abd soft, distended abdomen, no tenderness on my examination.  Bowel sound present CNS: Alert, awake, nonverbal, unable to communicate at baseline Psychiatry: Depressed look Extremities: No  pedal edema, no calf tenderness  Data Review: I have personally reviewed the laboratory data and studies available.  Recent Labs  Lab 11/16/20 0642 11/17/20 0612 11/18/20 0520 11/19/20 0516 11/22/20 0550  WBC 7.9 7.7 7.8 7.9 8.6  NEUTROABS  --   --   --   --  4.6  HGB 10.9* 10.4* 10.4* 10.8* 9.7*  HCT 33.9* 32.4* 31.7* 32.2* 29.7*  MCV 89.9 88.8 91.1 90.4 88.1  PLT 267 256 240 239 275    Recent Labs  Lab 11/16/20 0603 11/17/20 0612 11/18/20 0520 11/19/20 0516 11/20/20 0613 11/22/20 0550  NA 138 142 139 139  --  140  K 4.7 3.7 4.4 4.7  --  4.1  CL 104 105 105 103  --  103  CO2 23 28 28 29   --  27  GLUCOSE 88 104* 100* 107*  --  104*  BUN 15 15 20 17   --  25*  CREATININE 0.55 0.38* 0.52 0.50 0.46 0.71  CALCIUM 8.5* 9.1 9.0 8.9  --  9.0  MG 1.9 2.0  --   --   --   --   PHOS 3.0 2.8  --   --   --   --      F/u labs ordered Unresulted Labs (From admission, onward)     Start     Ordered   11/23/20 0500  Phenytoin level, total  Tomorrow morning,   R       Question:  Specimen collection method  Answer:  Lab=Lab collect   11/22/20 1235   11/22/20 0500  Phenytoin level, free and total  Tomorrow morning,   R       Question:  Specimen collection method  Answer:  Lab=Lab collect   11/21/20 0818   11/20/20 0500  Creatinine, serum  (enoxaparin (LOVENOX)    CrCl >/= 30 ml/min)  Weekly,   STAT     Comments: while on enoxaparin therapy    11/13/20 0356   11/12/20 2037  CBC with Differential  ONCE - STAT,   STAT        11/12/20 2036            Signed, 01/12/21, MD [facility] 11/22/2020

## 2020-11-23 DIAGNOSIS — R569 Unspecified convulsions: Secondary | ICD-10-CM | POA: Diagnosis not present

## 2020-11-23 DIAGNOSIS — G40909 Epilepsy, unspecified, not intractable, without status epilepticus: Secondary | ICD-10-CM | POA: Diagnosis not present

## 2020-11-23 LAB — PHENYTOIN LEVEL, TOTAL: Phenytoin Lvl: 16.2 ug/mL (ref 10.0–20.0)

## 2020-11-23 MED ORDER — POLYETHYLENE GLYCOL 3350 17 G PO PACK: 17.0000 g | PACK | Freq: Every day | ORAL | Status: DC | PRN

## 2020-11-23 MED ORDER — PHENYTOIN SODIUM EXTENDED 100 MG PO CAPS
200.0000 mg | ORAL_CAPSULE | Freq: Every day | ORAL | Status: DC
  Administered 2020-11-23: 21:00:00 200 mg via ORAL
  Filled 2020-11-23 (×2): qty 2

## 2020-11-23 NOTE — Consult Note (Signed)
Phenytoin Drug-Level Monitoring CONSULT NOTE  Pharmacy Consult for Phenytoin dosing  Indication: Seizures   No Known Allergies  Patient Measurements: Height: 5\' 8"  (172.7 cm) Weight: 122.8 kg (270 lb 12.8 oz) IBW/kg (Calculated) : 63.9 Adjusted Body Weight: 90.4 kg    Labs: Recent Labs    11/22/20 0550  WBC 8.6  HGB 9.7*  HCT 29.7*  PLT 275  CREATININE 0.71    Estimated Creatinine Clearance: 86.5 mL/min (by C-G formula based on SCr of 0.71 mg/dL).   Medical History: Past Medical History:  Diagnosis Date   Mental retardation    Seizures (HCC)     Medications:    Assessment: 75 y.o. female with medical history significant for mental retardation, morbid obesity and seizures who was brought into the ER on 8/3 from [facility] for evaluation of multiple seizures. Found to have elevated phenytoin level on admission. Pharmacy has been consulted to adjust phenytoin dose.  Drug-Drug interactions identified: Omeprazole - Phenytoin: Omeprazole ( Weak CYP2C19 inhibitor) may increase the serum concentration of phenytoin  Diazepam- Phenytoin -- Diazepam (CYY2C19 substrate) may increase serum concentrations of phenytoin  PTA dose: phenytoin ER capsule 200 mg daily + 400 mg ER capsule q HS (per nursing home administration guide)  *Per last Neurology note from Dr. 10/3 6/27, patient was to be on phenytoin 400 mg ER qhs in addition to her other antiepileptic medications, including new start of lamotrigine. It is unclear when/who increased the dose.*   Goal of Therapy:  Total phenytoin level = 10-20 mg/L, Free, level = 1-2 mg/L  Plan:  Phenytoin level < 20 today. Will start phenytoin 200 mg qHS after discussion with MD. Plan to order level 2-3 days after initiation to ensure pt remains therapeutic.  increased today, becoming further elevated; doses on hold    7/27, PharmD, BCPS Pharmacy Resident  11/23/2020 11:30 AM

## 2020-11-23 NOTE — Progress Notes (Signed)
PROGRESS NOTE  Mariah Jimenez  DOB: [birth year]  PCP: Keane Police, MD LJQ:[id number]  DOA: 11/12/2020  LOS: 10 days  Hospital Day: 12   Chief Complaint  Patient presents with   Seizures    Brief narrative: Mariah Jimenez is a 75 y.o. female with PMH significant for global demented delay and refractory seizures, morbid obesity who was brought into the ED on 11/12/20 from [facility] for evaluation of multiple seizures. Per nursing home staff patient had a focal seizure at around 6:24 PM and received a dose of IM Ativan 2 mg.  She was noted to be hypotensive by EMS with blood pressure of 83/41 and received a 500 cc fluid bolus prior to arriving to the emergency room.  In the ED, patient was noted to have abdominal distention with suprapubic tenderness.   CT scan of abdomen pelvis showed urinary retention, mild left hydronephrosis and hydroureter without obstructing stone.   Foley catheter was inserted and 1 L of urine was drained. Labs showed WBC count to 12.3 Chest x-ray did not show any consolidation. CT scan of the head did not show any acute intracranial abnormality.  Admitted to hospitalist service. See below for details.   Subjective: Patient was seen and examined this morning. Lying on bed.  Not in distress.  No new symptoms.  His sisters had concern of patient having potential mold growth at her back.  On examination, I see multiple black skin tags which do not look like mold to me.  Picture attached.  Assessment/Plan: Breakthrough seizure History of refractory seizures -Patient is on multiple antiepileptic medications which include Keppra, Lamictal, Vimpat, phenytoin and phenobarbital. -Primary presentation was for witnessed focal seizure at the facility. -8/7, patient had another recurrence of seizure which is controlled by IV Ativan.   -Continue all antiseizure medications. -Prior to admission, patient was on phenytoin 400 mg daily. Phenytoin level was  supratherapeutic therapeutic for several days probably because of storage in the fatty tissues.  Level this morning 8/14 is now in therapeutic range.  We will resume phenytoin at lower dose of 200 mg daily.  UTI -Urinalysis showed pyuria, many bacteria. -Urine culture showed more than 100,000 CFU per mL of Aerococcus urinae.  Also showed 40,000 CFU per mL of Enterococcus faecalis. -1 dose of fosfomycin was given. -No evidence of pyelonephritis in CT scan.  Acute urinary retention -Foley catheter was placed in the ER with drainage of about a liter of urine -Successful voiding trial done on 8/11.    Constipation -8/10, successful BM with enema.   -CT scan of abdomen and pelvis with contrast on the day of admission showed dilated sigmoid colon.  8/11, discussed with surgeon Dr. Claudine Mouton.  No need of surgical intervention since patient had successful bowel movement after tapwater enemas given.. -Continue Senokot daily and as needed MiraLAX.    Left adrenal mass -Stable 2.7 cm left adrenal mass, possibly adenoma. -Follow-up as an outpatient.    Mobility: Long-term resident of nursing home Code Status:   Code Status: DNR  Nutritional status: Body mass index is 41.17 kg/m.     Diet:  Diet Order             DIET - DYS 1 Room service appropriate? Yes; Fluid consistency: Thin  Diet effective now                  DVT prophylaxis: Lovenox    Antimicrobials: None at this time Fluid: None Consultants: Neurology, general surgery informally Family  Communication: No family at bedside today.  Status is: Inpatient  Remains inpatient appropriate because: Stable for discharge  Dispo: The patient is from: Long-term nursing home resident              Anticipated d/c is to: Back to nursing home              Patient currently medically stable to d/c.   Difficult to place patient No     Infusions:    Scheduled Meds:  diazepam  2 mg Oral BID   enoxaparin (LOVENOX) injection   0.5 mg/kg Subcutaneous Q24H   lacosamide  50 mg Oral BID   lamoTRIgine  25 mg Oral BID   levETIRAcetam  1,500 mg Oral BID   loratadine  10 mg Oral Daily   mouth rinse  15 mL Mouth Rinse 10 times per day   multivitamin with minerals  1 tablet Oral Daily   pantoprazole  40 mg Oral Daily   phenobarbital  32.4 mg Oral TID   Vitamin D (Ergocalciferol)  50,000 Units Oral Q7 days    Antimicrobials: Anti-infectives (From admission, onward)    Start     Dose/Rate Route Frequency Ordered Stop   11/16/20 1500  fosfomycin (MONUROL) packet 3 g        3 g Oral  Once 11/16/20 1350 11/16/20 1745   11/14/20 0400  cefTRIAXone (ROCEPHIN) 1 g in sodium chloride 0.9 % 100 mL IVPB  Status:  Discontinued        1 g 200 mL/hr over 30 Minutes Intravenous Every 24 hours 11/13/20 0357 11/16/20 1350   11/13/20 0315  cefTRIAXone (ROCEPHIN) 1 g in sodium chloride 0.9 % 100 mL IVPB        1 g 200 mL/hr over 30 Minutes Intravenous  Once 11/13/20 0302 11/13/20 0337       PRN meds: acetaminophen **OR** acetaminophen, bisacodyl, bisacodyl, diphenhydrAMINE, ipratropium-albuterol, LORazepam, ondansetron **OR** ondansetron (ZOFRAN) IV, polyethylene glycol, sodium chloride   Objective: Vitals:   11/23/20 0524 11/23/20 0813  BP: (!) 141/77 (!) 148/73  Pulse: 92 91  Resp: 18 16  Temp: 98 F (36.7 C) 97.8 F (36.6 C)  SpO2: 92% 100%    Intake/Output Summary (Last 24 hours) at 11/23/2020 1122 Last data filed at 11/23/2020 1009 Gross per 24 hour  Intake 740 ml  Output --  Net 740 ml    Filed Weights   11/12/20 2009 11/17/20 0320  Weight: 130.2 kg 122.8 kg   Weight change:  Body mass index is 41.17 kg/m.   Physical Exam: General exam: Elderly African-American female. Nonverbal at baseline.   Skin: No rashes, lesions or ulcers.  Multiple skin tags in her back. HEENT: Atraumatic, normocephalic, no obvious bleeding Lungs: Clear to auscultation bilaterally CVS: Regular rate and rhythm, no murmur GI/Abd  soft, distended abdomen, no tenderness on my examination.  Bowel sound present CNS: Alert, awake, nonverbal, unable to communicate at baseline Psychiatry: Depressed look Extremities: No pedal edema, no calf tenderness    Data Review: I have personally reviewed the laboratory data and studies available.  Recent Labs  Lab 11/17/20 0612 11/18/20 0520 11/19/20 0516 11/22/20 0550  WBC 7.7 7.8 7.9 8.6  NEUTROABS  --   --   --  4.6  HGB 10.4* 10.4* 10.8* 9.7*  HCT 32.4* 31.7* 32.2* 29.7*  MCV 88.8 91.1 90.4 88.1  PLT 256 240 239 275    Recent Labs  Lab 11/17/20 0612 11/18/20 0520 11/19/20 0516 11/20/20 0613 11/22/20  0550  NA 142 139 139  --  140  K 3.7 4.4 4.7  --  4.1  CL 105 105 103  --  103  CO2 28 28 29   --  27  GLUCOSE 104* 100* 107*  --  104*  BUN 15 20 17   --  25*  CREATININE 0.38* 0.52 0.50 0.46 0.71  CALCIUM 9.1 9.0 8.9  --  9.0  MG 2.0  --   --   --   --   PHOS 2.8  --   --   --   --      F/u labs ordered Unresulted Labs (From admission, onward)     Start     Ordered   11/22/20 0500  Phenytoin level, free and total  Tomorrow morning,   R       Question:  Specimen collection method  Answer:  Lab=Lab collect   11/21/20 0818   11/20/20 0500  Creatinine, serum  (enoxaparin (LOVENOX)    CrCl >/= 30 ml/min)  Weekly,   STAT     Comments: while on enoxaparin therapy    11/13/20 0356   11/12/20 2037  CBC with Differential  ONCE - STAT,   STAT        11/12/20 2036            Signed, 01/12/21, MD [facility] 11/23/2020

## 2020-11-23 NOTE — TOC Progression Note (Signed)
Transition of Care [facility]) - Progression Note    Patient Details  Name: Mariah Jimenez MRN: [id number] Date of Birth: 1945-10-29  [facility]) CM/SW Contact  Bing Quarry, RN Phone Number: 11/23/2020, 12:10 PM  Clinical Narrative: Dilantin monitoring and dosing per pharmacy notes today. Foley catheter placed in ED with 1 Liter urine returned. UTI present/on treatment. Will return to [facility]. Gabriel Cirri RN CM   Expected Discharge Plan: Skilled Nursing Facility Barriers to Discharge: Continued Medical Work up  Expected Discharge Plan and Services Expected Discharge Plan: Skilled Nursing Facility       Living arrangements for the past 2 months: Skilled Nursing Facility Physiological scientist)                                       Social Determinants of Health (SDOH) Interventions    Readmission Risk Interventions No flowsheet data found.

## 2020-11-24 DIAGNOSIS — G40909 Epilepsy, unspecified, not intractable, without status epilepticus: Secondary | ICD-10-CM | POA: Diagnosis not present

## 2020-11-24 DIAGNOSIS — R569 Unspecified convulsions: Secondary | ICD-10-CM | POA: Diagnosis not present

## 2020-11-24 LAB — RESP PANEL BY RT-PCR (FLU A&B, COVID) ARPGX2
Influenza A by PCR: NEGATIVE
Influenza B by PCR: NEGATIVE
SARS Coronavirus 2 by RT PCR: NEGATIVE

## 2020-11-24 LAB — PHENYTOIN LEVEL, FREE AND TOTAL
Phenytoin, Free: 2 ug/mL (ref 1.0–2.0)
Phenytoin, Total: 18.7 ug/mL (ref 10.0–20.0)

## 2020-11-24 MED ORDER — BISACODYL 5 MG PO TBEC: 10.0000 mg | DELAYED_RELEASE_TABLET | Freq: Every day | ORAL | 0 refills | Status: DC | PRN

## 2020-11-24 MED ORDER — POLYETHYLENE GLYCOL 3350 17 G PO PACK: 17.0000 g | PACK | Freq: Every day | ORAL | 0 refills | Status: DC | PRN

## 2020-11-24 MED ORDER — PHENYTOIN SODIUM EXTENDED 200 MG PO CAPS: 200.0000 mg | ORAL_CAPSULE | Freq: Every day | ORAL | Status: DC

## 2020-11-24 MED ORDER — PANTOPRAZOLE SODIUM 40 MG PO TBEC: 40.0000 mg | DELAYED_RELEASE_TABLET | Freq: Every day | ORAL | 0 refills | Status: DC

## 2020-11-24 MED ORDER — PHENOBARBITAL 32.4 MG PO TABS: 32.4000 mg | ORAL_TABLET | Freq: Three times a day (TID) | ORAL | Status: DC

## 2020-11-24 NOTE — Discharge Summary (Signed)
Physician Discharge Summary  Mariah EhlersBarbara Costantino ZOX:[id number] DOB: 10-12-1945 DOA: 11/12/2020  PCP: Keane PoliceSlade-Hartman, Venezela, MD  Admit date: 11/12/2020 Discharge date: 11/24/2020  Admitted From: Nursing facility Discharge disposition: Nursing facility   Code Status: DNR   Discharge Diagnosis:   Principal Problem:   Acute pyelonephritis Active Problems:   Seizure disorder [facility](HCC)   Intellectual disability   Acute urinary retention   Acute repetitive seizure [facility]HCC)   Chief Complaint  Patient presents with   Seizures    Brief narrative: Mariah Jimenez is a 75 y.o. female with PMH significant for global demented delay and refractory seizures, morbid obesity who was brought into the ED on 11/12/20 from [facility] for evaluation of multiple seizures. Per nursing home staff patient had a focal seizure at around 6:24 PM and received a dose of IM Ativan 2 mg.  She was noted to be hypotensive by EMS with blood pressure of 83/41 and received a 500 cc fluid bolus prior to arriving to the emergency room.  In the ED, patient was noted to have abdominal distention with suprapubic tenderness.   CT scan of abdomen pelvis showed urinary retention, mild left hydronephrosis and hydroureter without obstructing stone.   Foley catheter was inserted and 1 L of urine was drained. Labs showed WBC count to 12.3 Chest x-ray did not show any consolidation. CT scan of the head did not show any acute intracranial abnormality.  Admitted to hospitalist service. See below for details.   Subjective: Patient was seen and examined this morning. Lying on bed.  Not in distress.  No new symptoms.   Ready to discharge to facility today  Assessment/Plan: Breakthrough seizure History of refractory seizures -Patient is on multiple antiepileptic medications which include Keppra, Lamictal, Vimpat, phenytoin and phenobarbital. -Primary presentation was for witnessed focal seizure at the facility.  Neurology  consultation was obtained.  AEDs were continued. -8/7, patient had another recurrence of seizure which is controlled by IV Ativan.   -Currently continued on all antiseizure medications.  Supratherapeutic phenytoin level -Prior to admission, patient was on phenytoin 400 mg daily. Phenytoin level was supratherapeutic therapeutic for several days probably because of storage in the fatty tissues.   -Phenytoin level was monitored regularly.   -8/14 level is in therapeutic range.  Resumed phenytoin at 200 mg daily.  UTI -Urinalysis showed pyuria, many bacteria. -Urine culture showed more than 100,000 CFU per mL of Aerococcus urinae.  Also showed 40,000 CFU per mL of Enterococcus faecalis. -1 dose of fosfomycin was given. -No evidence of pyelonephritis in CT scan.  Acute urinary retention -Foley catheter was placed in the ER with drainage of about a liter of urine -Successful voiding trial done on 8/11.    Constipation -8/10, successful BM with enema.   -CT scan of abdomen and pelvis with contrast on the day of admission showed dilated sigmoid colon.  8/11, discussed with surgeon Dr. Claudine Moutonodenberg.  No need of surgical intervention since patient had successful bowel movement after tapwater enemas given.. -Continue Senokot daily and as needed MiraLAX.    Left adrenal mass -Noted in CT abdomen done on 8/3.  Per radiology, stable 2.7 cm left adrenal mass, possibly adenoma.  Stable compared to CT scan from June 2021 -Follow-up as an outpatient.  History of hypertension -Prior to admission, patient was on clonidine which has been on hold throughout her hospitalization.  Her blood pressure has always remained in normal range.  We will not resume clonidine at discharge.   Allergies as  of 11/24/2020   No Known Allergies      Medication List     STOP taking these medications    Acidophilus Chew   cloNIDine 0.1 MG tablet Commonly known as: CATAPRES   omeprazole 20 MG capsule Commonly known  as: PRILOSEC Replaced by: pantoprazole 40 MG tablet       TAKE these medications    acetaminophen 325 MG tablet Commonly known as: TYLENOL Take 650 mg by mouth every 6 (six) hours as needed.   bisacodyl 5 MG EC tablet Commonly known as: DULCOLAX Take 2 tablets (10 mg total) by mouth daily as needed for moderate constipation.   diazepam 5 MG tablet Commonly known as: VALIUM Take 5 mg by mouth 3 (three) times daily as needed. For signs or symptoms of pending seizure activity   diazepam 5 MG tablet Commonly known as: VALIUM Take 2.5 mg by mouth 2 (two) times daily.   diphenhydrAMINE 25 MG tablet Commonly known as: BENADRYL Take 25 mg by mouth at bedtime.   ipratropium-albuterol 0.5-2.5 (3) MG/3ML Soln Commonly known as: DUONEB Take 3 mLs by nebulization every 4 (four) hours as needed. PRN wheezing, SOB   lamoTRIgine 25 MG tablet Commonly known as: LAMICTAL Take 25 mg by mouth 2 (two) times daily.   levETIRAcetam 1000 MG tablet Commonly known as: KEPPRA Take 1,500 mg by mouth 2 (two) times daily.   loratadine 10 MG tablet Commonly known as: CLARITIN Take 10 mg by mouth daily.   LORazepam 2 MG/ML injection Commonly known as: ATIVAN Inject 1 mg into the muscle as needed. Repeat in 30 minutes as needed for seizure activity   multivitamin with minerals Tabs tablet Take 1 tablet by mouth daily.   pantoprazole 40 MG tablet Commonly known as: PROTONIX Take 1 tablet (40 mg total) by mouth daily. Start taking on: November 25, 2020 Replaces: omeprazole 20 MG capsule   PHENobarbital 32.4 MG tablet Commonly known as: LUMINAL Take 1 tablet (32.4 mg total) by mouth 3 (three) times daily. What changed:  medication strength additional instructions   phenytoin 200 MG ER capsule Commonly known as: DILANTIN Take 1 capsule (200 mg total) by mouth at bedtime. What changed:  medication strength how much to take additional instructions Another medication with the same name  was removed. Continue taking this medication, and follow the directions you see here.   polyethylene glycol 17 g packet Commonly known as: MIRALAX / GLYCOLAX Take 17 g by mouth daily as needed for moderate constipation.   SPS 15 GM/60ML suspension Generic drug: sodium polystyrene Take 60 mLs by mouth every Wednesday.   Vimpat 50 MG Tabs tablet Generic drug: lacosamide Take 50 mg by mouth 2 (two) times daily.        Discharge Instructions:  Diet Recommendation: Regular diet   Follow with Primary MD Keane Police, MD in 7 days   Get CBC/BMP checked in next visit within 1 week by PCP or SNF MD ( we routinely change or add medications that can affect your baseline labs and fluid status, therefore we recommend that you get the mentioned basic workup next visit with your PCP, your PCP may decide not to get them or add new tests based on their clinical decision)  On your next visit with your PCP, please Get Medicines reviewed and adjusted.  Please request your PCP  to go over all Hospital Tests and Procedure/Radiological results at the follow up, please get all Hospital records sent to your Prim MD by signing  hospital release before you go home.  Activity: As tolerated with Full fall precautions use walker/cane & assistance as needed  For Heart failure patients - Check your Weight same time everyday, if you gain over 2 pounds, or you develop in leg swelling, experience more shortness of breath or chest pain, call your Primary MD immediately. Follow Cardiac Low Salt Diet and 1.5 lit/day fluid restriction.  If you have smoked or chewed Tobacco in the last 2 yrs please stop smoking, stop any regular Alcohol  and or any Recreational drug use.  If you experience worsening of your admission symptoms, develop shortness of breath, life threatening emergency, suicidal or homicidal thoughts you must seek medical attention immediately by calling 911 or calling your MD immediately  if  symptoms less severe.  You Must read complete instructions/literature along with all the possible adverse reactions/side effects for all the Medicines you take and that have been prescribed to you. Take any new Medicines after you have completely understood and accpet all the possible adverse reactions/side effects.   Do not drive, operate heavy machinery, perform activities at heights, swimming or participation in water activities or provide baby sitting services if your were admitted for syncope or siezures until you have seen by Primary MD or a Neurologist and advised to do so again.  Do not drive when taking Pain medications.  Do not take more than prescribed Pain, Sleep and Anxiety Medications  Wear Seat belts while driving.   Please note You were cared for by a hospitalist during your hospital stay. If you have any questions about your discharge medications or the care you received while you were in the hospital after you are discharged, you can call the unit and asked to speak with the hospitalist on call if the hospitalist that took care of you is not available. Once you are discharged, your primary care physician will handle any further medical issues. Please note that NO REFILLS for any discharge medications will be authorized once you are discharged, as it is imperative that you return to your primary care physician (or establish a relationship with a primary care physician if you do not have one) for your aftercare needs so that they can reassess your need for medications and monitor your lab values.    Follow ups:    Follow-up Information     Keane Police, MD Follow up.   Specialty: Family Medicine Contact information: [address] [place] [address] [phone]                 Wound care:   Incision (Closed) 04/09/16 Leg Right (Active)  Date First Assessed/Time First Assessed: 04/09/16 1355   Location: Leg  Location Orientation: Right     Assessments 04/09/2016  1:56 PM 04/15/2016  7:30 AM  Dressing Type Impregnated gauze (bismuth) Gauze (Comment)     No Linked orders to display     Pressure Injury 10/04/19 Sacrum Right Stage 2 -  Partial thickness loss of dermis presenting as a shallow open injury with a red, pink wound bed without slough. (Active)  Date First Assessed/Time First Assessed: 10/04/19 2200   Location: Sacrum  Location Orientation: Right  Staging: Stage 2 -  Partial thickness loss of dermis presenting as a shallow open injury with a red, pink wound bed without slough.  Present on Adm...    Assessments 10/04/2019 10:00 PM 10/15/2019  8:00 PM  Dressing Type Moisture barrier Moisture barrier  Peri-wound Assessment Intact --  Drainage Amount  None --     No Linked orders to display    Discharge Exam:   Vitals:   11/23/20 2121 11/24/20 0038 11/24/20 0519 11/24/20 0728  BP: 116/68 119/68 (!) 123/103 129/62  Pulse: 95 95 93 96  Resp: Temp: 97.7 F (36.5 C) 98.1 F (36.7 C) 98.2 F (36.8 C) 98 F (36.7 C)  TempSrc: Oral Oral Oral   SpO2: 97% 100% 100% 100%  Weight:      Height:        Body mass index is 41.17 kg/m.  General exam: Elderly morbidly obese African-American female. Skin: No rashes, lesions or ulcers. HEENT: Atraumatic, normocephalic, no obvious bleeding Lungs: Clear to auscultation bilaterally CVS: Regular rate and rhythm, no murmur GI/Abd soft, distended from obesity, nontender, bowel sound present CNS: Alert, noncommunicative at baseline because of cognitive cognitive ntal disorder Psychiatry: Mood appropriate Extremities: Chronic skin changes.  Time coordinating discharge: 35 minutes   The results of significant diagnostics from this hospitalization (including imaging, microbiology, ancillary and laboratory) are listed below for reference.    Procedures and Diagnostic Studies:   CT HEAD WO CONTRAST ( )  Result Date: 11/12/2020 CLINICAL DATA:  Dizziness seizure EXAM:  CT HEAD WITHOUT CONTRAST TECHNIQUE: Contiguous axial images were obtained from the base of the skull through the vertex without intravenous contrast. COMPARISON:  CT brain 09/18/2020, 08/12/2020 FINDINGS: Brain: No acute territorial infarction, hemorrhage or intracranial mass. Mild atrophy. Generalized cerebellar atrophy. Stable ventricle size. Vascular: No hyperdense vessels.  Carotid vascular calcification Skull: No fracture.  Generalized calvarial thickening Sinuses/Orbits: Left mastoid effusion. Mucosal thickening in the sinuses Other: None IMPRESSION: 1. No CT evidence for acute intracranial abnormality. 2. Chronic cerebellar atrophy. 3. Left mastoid effusion Electronically Signed   By: Jasmine Pang M.D.   On: 11/12/2020 23:47   CT ABDOMEN PELVIS W CONTRAST  Result Date: 11/13/2020 CLINICAL DATA:  Distension EXAM: CT ABDOMEN AND PELVIS WITH CONTRAST TECHNIQUE: Multidetector CT imaging of the abdomen and pelvis was performed using the standard protocol following bolus administration of intravenous contrast. CONTRAST:  OMNIPAQUE IOHEXOL 300 MG/ML  SOLN COMPARISON:  CT 10/04/2019 FINDINGS: Lower chest: Lung bases demonstrate atelectasis at the left base. No pleural effusion. Stable cardiac size. Hepatobiliary: No focal hepatic abnormality. No calcified gallstone. No biliary dilatation. Pancreas: Unremarkable. No pancreatic ductal dilatation or surrounding inflammatory changes. Spleen: Normal in size without focal abnormality. Adrenals/Urinary Tract: Right adrenal gland is normal. Stable 2.7 cm left adrenal mass. Mild left hydronephrosis and hydroureter without obstructing stone. Distended urinary bladder. Mild urothelial enhancement on the left. Small bilateral renal cysts. Stomach/Bowel: The stomach is nonenlarged. No dilated small bowel. Mild focal is air distension of sigmoid colon which is visualized in the right hemiabdomen. Narrowed appearing segment of colon upstream to the air distended segment  of sigmoid colon and decompressed appearing rectosigmoid colon. Mild rectal wall thickening. Vascular/Lymphatic: Nonaneurysmal aorta.  No suspicious nodes Reproductive: Uterus and bilateral adnexa are unremarkable. Other: Negative for free air or free fluid. Musculoskeletal: No acute or suspicious osseous abnormality IMPRESSION: 1. Mild focal air distension of the sigmoid colon which is visible in the right hemiabdomen. Narrowed appearing segments of colon upstream and downstream to the dilated segment with collapsed rectosigmoid colon, raising possibility of obstructed segment of sigmoid colon. There is no evidence for small bowel obstruction. 2. Mild left hydronephrosis and hydroureter without obstructing stone. Mild urothelial enhancement on the left, question ascending urinary tract infection. There is marked bladder distension 3.  Stable 2.7 cm left adrenal mass, possibly adenoma. Electronically Signed   By: Jasmine Pang M.D.   On: 11/13/2020 00:03   DG Chest Portable 1 View  Result Date: 11/12/2020 CLINICAL DATA:  Vomiting and seizure. EXAM: PORTABLE CHEST 1 VIEW COMPARISON:  Chest radiograph dated 08/12/2020. FINDINGS: Shallow inspiration. There is cardiomegaly with vascular congestion and possible mild edema. No focal consolidation, pleural effusion pneumothorax. Atherosclerotic calcification of the aorta. No acute osseous pathology. IMPRESSION: Cardiomegaly with vascular congestion.  No focal consolidation. Electronically Signed   By: Elgie Collard M.D.   On: 11/12/2020 22:01     Labs:   Basic Metabolic Panel: Recent Labs  Lab 11/18/20 0520 11/19/20 0516 11/20/20 0613 11/22/20 0550  NA 139 139  --  140  K 4.4 4.7  --  4.1  CL 105 103  --  103  CO2 28 29  --  27  GLUCOSE 100* 107*  --  104*  BUN 20 17  --  25*  CREATININE 0.52 0.50 0.46 0.71  CALCIUM 9.0 8.9  --  9.0   GFR Estimated Creatinine Clearance: 86.5 mL/min (by C-G formula based on SCr of 0.71 mg/dL). Liver Function  Tests: No results for input(s): AST, ALT, ALKPHOS, BILITOT, PROT, ALBUMIN in the last 168 hours. No results for input(s): LIPASE, AMYLASE in the last 168 hours. No results for input(s): AMMONIA in the last 168 hours. Coagulation profile No results for input(s): INR, PROTIME in the last 168 hours.  CBC: Recent Labs  Lab 11/18/20 0520 11/19/20 0516 11/22/20 0550  WBC 7.8 7.9 8.6  NEUTROABS  --   --  4.6  HGB 10.4* 10.8* 9.7*  HCT 31.7* 32.2* 29.7*  MCV 91.1 90.4 88.1  PLT 240 239 275   Cardiac Enzymes: No results for input(s): CKTOTAL, CKMB, CKMBINDEX, TROPONINI in the last 168 hours. BNP: Invalid input(s): POCBNP CBG: No results for input(s): GLUCAP in the last 168 hours. D-Dimer No results for input(s): DDIMER in the last 72 hours. Hgb A1c No results for input(s): HGBA1C in the last 72 hours. Lipid Profile No results for input(s): CHOL, HDL, LDLCALC, TRIG, CHOLHDL, LDLDIRECT in the last 72 hours. Thyroid function studies No results for input(s): TSH, T4TOTAL, T3FREE, THYROIDAB in the last 72 hours.  Invalid input(s): FREET3 Anemia work up No results for input(s): VITAMINB12, FOLATE, FERRITIN, TIBC, IRON, RETICCTPCT in the last 72 hours. Microbiology No results found for this or any previous visit (from the past 240 hour(s)).   Signed: Melina Schools Rashaan Wyles  [facility] 11/24/2020, 11:37 AM

## 2020-11-24 NOTE — TOC Transition Note (Signed)
Transition of Care [facility]) - CM/SW Discharge Note   Patient Details  Name: Mariah Jimenez MRN: [id number] Date of Birth: May 24, 1945  [facility] CM/SW Contact:  Allayne Butcher, RN Phone Number: 11/24/2020, 1:52 PM   Clinical Narrative:    Patient medically cleared for discharge back to [facility] in [place].  Patient is going to room A6, bedside RN has called report.  RNCM has arranged EMS transport, patient is 4th on the list for pick up.  RNCM attempted to contact patient's sister  x2 but no answer and VM is full.    Final next level of care: Skilled Nursing Facility Barriers to Discharge: Barriers Resolved   Patient Goals and CMS Choice Patient states their goals for this hospitalization and ongoing recovery are:: to [facility] to [facility] CMS Medicare.gov Compare Post Acute Care list provided to:: Patient Represenative (must comment) Choice offered to / list presented to : Sibling  Discharge Placement              Patient chooses bed at: [facility] [place] Patient to be transferred to facility by: [facility] Name of family member notified: Attempted to notify sister [name] x2- no answer, no VM Patient and family notified of of transfer: 11/24/20  Discharge Plan and Services                DME Arranged: N/A DME Agency: NA       HH Arranged: NA HH Agency: NA        Social Determinants of Health (SDOH) Interventions     Readmission Risk Interventions No flowsheet data found.

## 2020-11-25 DIAGNOSIS — R339 Retention of urine, unspecified: Secondary | ICD-10-CM | POA: Diagnosis not present

## 2020-11-25 DIAGNOSIS — G40909 Epilepsy, unspecified, not intractable, without status epilepticus: Secondary | ICD-10-CM | POA: Diagnosis not present

## 2020-11-25 DIAGNOSIS — R5381 Other malaise: Secondary | ICD-10-CM | POA: Diagnosis not present

## 2020-11-25 DIAGNOSIS — N10 Acute tubulo-interstitial nephritis: Secondary | ICD-10-CM | POA: Diagnosis not present

## 2020-11-27 DIAGNOSIS — G40909 Epilepsy, unspecified, not intractable, without status epilepticus: Secondary | ICD-10-CM | POA: Diagnosis not present

## 2020-11-29 DIAGNOSIS — G40909 Epilepsy, unspecified, not intractable, without status epilepticus: Secondary | ICD-10-CM | POA: Diagnosis not present

## 2020-12-03 DIAGNOSIS — Z5181 Encounter for therapeutic drug level monitoring: Secondary | ICD-10-CM | POA: Diagnosis not present

## 2020-12-17 DIAGNOSIS — G40909 Epilepsy, unspecified, not intractable, without status epilepticus: Secondary | ICD-10-CM | POA: Diagnosis not present

## 2020-12-17 DIAGNOSIS — I872 Venous insufficiency (chronic) (peripheral): Secondary | ICD-10-CM | POA: Diagnosis not present

## 2020-12-17 DIAGNOSIS — F79 Unspecified intellectual disabilities: Secondary | ICD-10-CM | POA: Diagnosis not present

## 2020-12-17 DIAGNOSIS — L603 Nail dystrophy: Secondary | ICD-10-CM | POA: Diagnosis not present

## 2020-12-31 DIAGNOSIS — L0293 Carbuncle, unspecified: Secondary | ICD-10-CM | POA: Diagnosis not present

## 2020-12-31 DIAGNOSIS — G40909 Epilepsy, unspecified, not intractable, without status epilepticus: Secondary | ICD-10-CM | POA: Diagnosis not present

## 2021-01-12 DIAGNOSIS — K219 Gastro-esophageal reflux disease without esophagitis: Secondary | ICD-10-CM | POA: Diagnosis not present

## 2021-01-12 DIAGNOSIS — G40909 Epilepsy, unspecified, not intractable, without status epilepticus: Secondary | ICD-10-CM | POA: Diagnosis not present

## 2021-01-12 DIAGNOSIS — F79 Unspecified intellectual disabilities: Secondary | ICD-10-CM | POA: Diagnosis not present

## 2021-01-14 ENCOUNTER — Non-Acute Institutional Stay: Payer: Medicare Other | Admitting: Primary Care

## 2021-01-14 ENCOUNTER — Other Ambulatory Visit: Payer: Self-pay

## 2021-01-14 DIAGNOSIS — F79 Unspecified intellectual disabilities: Secondary | ICD-10-CM

## 2021-01-14 DIAGNOSIS — G40909 Epilepsy, unspecified, not intractable, without status epilepticus: Secondary | ICD-10-CM | POA: Diagnosis not present

## 2021-01-14 DIAGNOSIS — Z515 Encounter for palliative care: Secondary | ICD-10-CM

## 2021-01-14 NOTE — Progress Notes (Signed)
[place] Consult Note Telephone: [phone]  Fax: [phone]    Date of encounter: 01/14/21 12 noon PATIENT NAME: Mariah Jimenez [address] [place] [address]   [phone] (home)  DOB: 05/21/1945 MRN: [id number] PRIMARY CARE PROVIDER:    Alvester Morin, MD,  [address]. [place] [address] [phone]  REFERRING PROVIDER:   Alvester Morin, MD [address] [place],  [place] [address] [phone]  RESPONSIBLE PARTY:    Contact Information     Name Relation Home Work Mobile   Mariah Jimenez Sister   [phone]        I met face to face with patient in [facility] facility. Palliative Care was asked to follow this patient by consultation request of  Mariah Jimenez* to address advance care planning and complex medical decision making. This is a follow up visit.                                   ASSESSMENT AND PLAN / RECOMMENDATIONS:   Advance Care Planning/Goals of Care: Goals include to maximize quality of life and symptom management.  CODE STATUS: DNR  Symptom Management/Plan:  I met with patient in her nursing home room. She was taking a rest and although arousable closed her eyes and went back to sleep. She has been eating well per staff and does not have any pain or constipation complaints. Her roommate understands many of her request and advocates for her. She's currently using heel protectors as she is spending a lot of time in bed.  Follow up Palliative Care Visit: Palliative care will continue to follow for complex medical decision making, advance care planning, and clarification of goals. Return 12 weeks or prn.  I spent 15 minutes providing this consultation. More than 50% of the time in this consultation was spent in counseling and care coordination.  PPS: 30%  HOSPICE ELIGIBILITY/DIAGNOSIS: TBD  Chief Complaint: debility  HISTORY OF PRESENT ILLNESS:   Mariah Jimenez is a 75 y.o. year old female  with intellectual disability, immobility .   History obtained from review of EMR, discussion with primary team, and interview with family, facility staff/caregiver and/or Mariah Jimenez.  I reviewed available labs, medications, imaging, studies and related documents from the EMR.  Records reviewed and summarized above.   ROS/staff   General: NAD ENMT: denies dysphagia Pulmonary: denies cough, denies increased SOB Abdomen: endorses good appetite, denies constipation, endorses incontinence of bowel GU: denies dysuria, endorses incontinence of urine MSK: endorses weakness,  no falls reported Skin: denies rashes or wounds Neurological: denies pain, denies insomnia Psych: Endorses positive mood Heme/lymph/immuno: denies bruises, abnormal bleeding  Physical Exam: Current and past weights: 258 lbs, 30 lb loss since 4/22, 8% loss Constitutional: NAD General: frail appearing, obese  EYES: anicteric sclera, lids intact, no discharge  ENMT: intact hearing, oral mucous membranes moist, lip protrusion CV: S1S2, RRR, slight LE edema Pulmonary: LCTA, no increased work of breathing, no cough, room air Abdomen: intake 50-75%, normo-active BS + 4 quadrants, soft and non tender, no ascites GU: deferred MSK: no sarcopenia, moves all extremities, non ambulatory Skin: warm and dry, no rashes or wounds on visible skin Neuro:  ++generalized weakness,  ++ cognitive impairment Psych: non-anxious affect, A and O x 1 Hem/lymph/immuno: no widespread bruising   Thank you for the opportunity to participate in the care of Mariah Jimenez.  The palliative care team  will continue to follow. Please call our office at [phone] if we can be of additional assistance.   Jason Coop, NP DNP, AGPCNP-BC  COVID-19 PATIENT SCREENING TOOL Asked and negative response unless otherwise noted:   Have you had symptoms of covid, tested positive or been in contact with  someone with symptoms/positive test in the past 5-10 days?

## 2021-01-19 DIAGNOSIS — Z23 Encounter for immunization: Secondary | ICD-10-CM | POA: Diagnosis not present

## 2021-01-19 DIAGNOSIS — F79 Unspecified intellectual disabilities: Secondary | ICD-10-CM | POA: Diagnosis not present

## 2021-01-19 DIAGNOSIS — G40909 Epilepsy, unspecified, not intractable, without status epilepticus: Secondary | ICD-10-CM | POA: Diagnosis not present

## 2021-01-19 DIAGNOSIS — R197 Diarrhea, unspecified: Secondary | ICD-10-CM | POA: Diagnosis not present

## 2021-01-21 DIAGNOSIS — R197 Diarrhea, unspecified: Secondary | ICD-10-CM | POA: Diagnosis not present

## 2021-01-26 DIAGNOSIS — Z23 Encounter for immunization: Secondary | ICD-10-CM | POA: Diagnosis not present

## 2021-02-04 DIAGNOSIS — Z79899 Other long term (current) drug therapy: Secondary | ICD-10-CM | POA: Diagnosis not present

## 2021-02-04 DIAGNOSIS — R569 Unspecified convulsions: Secondary | ICD-10-CM | POA: Diagnosis not present

## 2021-02-05 DIAGNOSIS — E860 Dehydration: Secondary | ICD-10-CM | POA: Diagnosis not present

## 2021-02-05 DIAGNOSIS — Z5181 Encounter for therapeutic drug level monitoring: Secondary | ICD-10-CM | POA: Diagnosis not present

## 2021-02-09 DIAGNOSIS — N898 Other specified noninflammatory disorders of vagina: Secondary | ICD-10-CM | POA: Diagnosis not present

## 2021-02-09 DIAGNOSIS — L0293 Carbuncle, unspecified: Secondary | ICD-10-CM | POA: Diagnosis not present

## 2021-02-17 DIAGNOSIS — I872 Venous insufficiency (chronic) (peripheral): Secondary | ICD-10-CM | POA: Diagnosis not present

## 2021-02-17 DIAGNOSIS — L603 Nail dystrophy: Secondary | ICD-10-CM | POA: Diagnosis not present

## 2021-03-02 DIAGNOSIS — N898 Other specified noninflammatory disorders of vagina: Secondary | ICD-10-CM | POA: Diagnosis not present

## 2021-03-12 DIAGNOSIS — R339 Retention of urine, unspecified: Secondary | ICD-10-CM | POA: Diagnosis not present

## 2021-03-12 DIAGNOSIS — N184 Chronic kidney disease, stage 4 (severe): Secondary | ICD-10-CM | POA: Diagnosis not present

## 2021-03-12 DIAGNOSIS — K5900 Constipation, unspecified: Secondary | ICD-10-CM | POA: Diagnosis not present

## 2021-03-12 DIAGNOSIS — F79 Unspecified intellectual disabilities: Secondary | ICD-10-CM | POA: Diagnosis not present

## 2021-03-12 DIAGNOSIS — I10 Essential (primary) hypertension: Secondary | ICD-10-CM | POA: Diagnosis not present

## 2021-03-12 DIAGNOSIS — E1122 Type 2 diabetes mellitus with diabetic chronic kidney disease: Secondary | ICD-10-CM | POA: Diagnosis not present

## 2021-03-12 DIAGNOSIS — K219 Gastro-esophageal reflux disease without esophagitis: Secondary | ICD-10-CM | POA: Diagnosis not present

## 2021-03-12 DIAGNOSIS — G40909 Epilepsy, unspecified, not intractable, without status epilepticus: Secondary | ICD-10-CM | POA: Diagnosis not present

## 2021-03-20 DIAGNOSIS — F79 Unspecified intellectual disabilities: Secondary | ICD-10-CM | POA: Diagnosis not present

## 2021-03-20 DIAGNOSIS — N898 Other specified noninflammatory disorders of vagina: Secondary | ICD-10-CM | POA: Diagnosis not present

## 2021-03-20 DIAGNOSIS — G40909 Epilepsy, unspecified, not intractable, without status epilepticus: Secondary | ICD-10-CM | POA: Diagnosis not present

## 2021-03-23 DIAGNOSIS — N898 Other specified noninflammatory disorders of vagina: Secondary | ICD-10-CM | POA: Diagnosis not present

## 2021-03-23 DIAGNOSIS — Z743 Need for continuous supervision: Secondary | ICD-10-CM | POA: Diagnosis not present

## 2021-03-23 DIAGNOSIS — R404 Transient alteration of awareness: Secondary | ICD-10-CM | POA: Diagnosis not present

## 2021-03-23 DIAGNOSIS — G40909 Epilepsy, unspecified, not intractable, without status epilepticus: Secondary | ICD-10-CM | POA: Diagnosis not present

## 2021-03-23 DIAGNOSIS — E785 Hyperlipidemia, unspecified: Secondary | ICD-10-CM | POA: Diagnosis not present

## 2021-03-23 DIAGNOSIS — N766 Ulceration of vulva: Secondary | ICD-10-CM | POA: Diagnosis not present

## 2021-03-23 DIAGNOSIS — G4730 Sleep apnea, unspecified: Secondary | ICD-10-CM | POA: Diagnosis not present

## 2021-03-23 DIAGNOSIS — I10 Essential (primary) hypertension: Secondary | ICD-10-CM | POA: Diagnosis not present

## 2021-03-23 DIAGNOSIS — E119 Type 2 diabetes mellitus without complications: Secondary | ICD-10-CM | POA: Diagnosis not present

## 2021-03-23 DIAGNOSIS — N9089 Other specified noninflammatory disorders of vulva and perineum: Secondary | ICD-10-CM | POA: Diagnosis not present

## 2021-04-06 ENCOUNTER — Non-Acute Institutional Stay: Payer: Medicare Other | Admitting: Primary Care

## 2021-04-06 ENCOUNTER — Other Ambulatory Visit: Payer: Self-pay

## 2021-04-06 DIAGNOSIS — Z515 Encounter for palliative care: Secondary | ICD-10-CM | POA: Diagnosis not present

## 2021-04-06 DIAGNOSIS — F79 Unspecified intellectual disabilities: Secondary | ICD-10-CM

## 2021-04-06 DIAGNOSIS — G40909 Epilepsy, unspecified, not intractable, without status epilepticus: Secondary | ICD-10-CM

## 2021-04-06 NOTE — Addendum Note (Signed)
Addended by: Marijo File on: 04/06/2021 08:01 PM   Modules accepted: Orders

## 2021-04-06 NOTE — Progress Notes (Addendum)
[facility] Consult Note Telephone: [phone]  Fax: [phone]    Date of encounter: 04/06/21 4:00 pm PATIENT NAME: Mariah Jimenez [place] [address]   [phone] (home)  DOB: Jan 28, 1946 MRN: [id number] PRIMARY CARE PROVIDER:    Alvester Morin, MD,  [address] [place] [address] [phone]  REFERRING PROVIDER:   Alvester Morin, MD [address] [place],  [place] [address] [phone]  RESPONSIBLE PARTY:    Contact Information     Name Relation Home Work Mobile   [place] Sister   [phone]        I met face to face with patient in [facility] facility. Palliative Care was asked to follow this patient by consultation request of  Slade-Hartman, Ivette Loyal* to address advance care planning and complex medical decision making. This is a follow up visit.                                   ASSESSMENT AND PLAN / RECOMMENDATIONS:   Advance Care Planning/Goals of Care: Goals include to maximize quality of life and symptom management.  CODE STATUS: DNR  Symptom Management/Plan:  Patient is at her baseline, able to interact verbally at times. Other times she is withdrawn. She is currently resting, and staff states she was more alert earlier in the day. She's just finished her lunch. She appear comfortable, 0/10 on PAINAD score. Medications reviewed.   Follow up Palliative Care Visit: Palliative care will continue to follow for complex medical decision making, advance care planning, and clarification of goals. Return 8 weeks or prn.  I spent 15 minutes providing this consultation. More than 50% of the time in this consultation was spent in counseling and care coordination.  PPS: 30%  HOSPICE ELIGIBILITY/DIAGNOSIS: TBD  Chief Complaint: debility, immobility  HISTORY OF PRESENT ILLNESS:  Zarina Pe is a 75 y.o. year old female  with intellectual debility,  immobility. She has shown 5% weight loss in 4 months  History obtained from review of EMR, discussion with primary team, and interview with family, facility staff/caregiver and/or Ms. Colglazier.  I reviewed available labs, medications, imaging, studies and related documents from the EMR.  Records reviewed and summarized above.   ROS/staff   General: NAD ENMT: denies dysphagia Pulmonary: denies cough, denies increased SOB Abdomen: endorses good appetite, denies constipation, endorses incontinence of bowel GU: denies dysuria, endorses incontinence of urine MSK:  denies new weakness,  no falls reported Skin: denies rashes or wounds Neurological: denies pain, denies insomnia Psych: Endorses positive mood Heme/lymph/immuno: denies bruises, abnormal bleeding  Physical Exam: Current and past weights: 252 lbs, 13 lb loss in 4 months, 5% Constitutional: NAD General: frail appearing, obese  EYES:  oral mucous membranes moist, CV:  RRR, no LE edema Pulmonary: no increased work of breathing, no cough, room air Abdomen: intake 76-100%,  no ascites GU: deferred MSK: no sarcopenia,  non ambulatory,  Skin: warm and dry, no rashes or wounds on visible skin Neuro:  + generalized weakness,  + cognitive impairment Psych: non-anxious affect Hem/lymph/immuno: no widespread bruising Outpatient Encounter Medications as of 04/06/2021  Medication Sig   diazepam (VALIUM) 5 MG tablet Take 5 mg by mouth 3 (three) times daily as needed. For signs or symptoms of pending seizure activity   diazepam (VALIUM) 5 MG tablet Take 2.5 mg by mouth 2 (two) times daily.   ipratropium-albuterol (DUONEB) 0.5-2.5 (  3) MG/3ML SOLN Take 3 mLs by nebulization every 4 (four) hours as needed. PRN wheezing, SOB   lamoTRIgine (LAMICTAL) 25 MG tablet Take 25 mg by mouth 2 (two) times daily. Take 50 mg in the morning and 75 mg at bedtime.   levETIRAcetam (KEPPRA) 750 MG tablet Take 750 mg by mouth 2 (two) times daily.   loratadine  (CLARITIN) 10 MG tablet Take 10 mg by mouth daily.   LORazepam (ATIVAN) 1 MG tablet Take 1 mg by mouth daily as needed. Special Instructions: Give 1 mg crushed and placed under her tongue for seize activity, may repeat in 30 minutes if seizures continue.   LORazepam (ATIVAN) 2 MG/ML injection Inject 1 mg into the muscle as needed. Repeat in 30 minutes as needed for seizure activity   Multiple Vitamin (MULTIVITAMIN WITH MINERALS) TABS tablet Take 1 tablet by mouth daily.   pantoprazole (PROTONIX) 40 MG tablet Take 1 tablet (40 mg total) by mouth daily.   PHENobarbital (LUMINAL) 32.4 MG tablet Take 1 tablet (32.4 mg total) by mouth 3 (three) times daily.   phenytoin (DILANTIN) 200 MG ER capsule Take 1 capsule (200 mg total) by mouth at bedtime. (Patient taking differently: Take 400 mg by mouth at bedtime.)   SPS 15 GM/60ML suspension Take 60 mLs by mouth every Wednesday.   VIMPAT 50 MG TABS tablet Take 50 mg by mouth 2 (two) times daily.   [DISCONTINUED] acetaminophen (TYLENOL) 325 MG tablet Take 650 mg by mouth every 6 (six) hours as needed.   [DISCONTINUED] bisacodyl (DULCOLAX) 5 MG EC tablet Take 2 tablets (10 mg total) by mouth daily as needed for moderate constipation.   [DISCONTINUED] diphenhydrAMINE (BENADRYL) 25 MG tablet Take 25 mg by mouth at bedtime.   [DISCONTINUED] polyethylene glycol (MIRALAX / GLYCOLAX) 17 g packet Take 17 g by mouth daily as needed for moderate constipation.   No facility-administered encounter medications on file as of 04/06/2021.     Thank you for the opportunity to participate in the care of Mariah Jimenez.  The palliative care team will continue to follow. Please call our office at [phone] if we can be of additional assistance.   Jason Coop, NP DNP, AGPCNP-BC  COVID-19 PATIENT SCREENING TOOL Asked and negative response unless otherwise noted:   Have you had symptoms of covid, tested positive or been in contact with someone with  symptoms/positive test in the past 5-10 days?

## 2021-04-20 DIAGNOSIS — Z09 Encounter for follow-up examination after completed treatment for conditions other than malignant neoplasm: Secondary | ICD-10-CM | POA: Diagnosis not present

## 2021-04-22 DIAGNOSIS — L259 Unspecified contact dermatitis, unspecified cause: Secondary | ICD-10-CM | POA: Diagnosis not present

## 2021-04-22 DIAGNOSIS — F79 Unspecified intellectual disabilities: Secondary | ICD-10-CM | POA: Diagnosis not present

## 2021-04-22 DIAGNOSIS — G40909 Epilepsy, unspecified, not intractable, without status epilepticus: Secondary | ICD-10-CM | POA: Diagnosis not present

## 2021-05-05 DIAGNOSIS — I872 Venous insufficiency (chronic) (peripheral): Secondary | ICD-10-CM | POA: Diagnosis not present

## 2021-05-05 DIAGNOSIS — B351 Tinea unguium: Secondary | ICD-10-CM | POA: Diagnosis not present

## 2021-05-16 DIAGNOSIS — Z5181 Encounter for therapeutic drug level monitoring: Secondary | ICD-10-CM | POA: Diagnosis not present

## 2021-05-16 DIAGNOSIS — R945 Abnormal results of liver function studies: Secondary | ICD-10-CM | POA: Diagnosis not present

## 2021-05-16 DIAGNOSIS — I10 Essential (primary) hypertension: Secondary | ICD-10-CM | POA: Diagnosis not present

## 2021-05-18 DIAGNOSIS — I10 Essential (primary) hypertension: Secondary | ICD-10-CM | POA: Diagnosis not present

## 2021-05-18 DIAGNOSIS — R945 Abnormal results of liver function studies: Secondary | ICD-10-CM | POA: Diagnosis not present

## 2021-05-18 DIAGNOSIS — Z5181 Encounter for therapeutic drug level monitoring: Secondary | ICD-10-CM | POA: Diagnosis not present

## 2021-06-10 DIAGNOSIS — K219 Gastro-esophageal reflux disease without esophagitis: Secondary | ICD-10-CM | POA: Diagnosis not present

## 2021-06-10 DIAGNOSIS — E1122 Type 2 diabetes mellitus with diabetic chronic kidney disease: Secondary | ICD-10-CM | POA: Diagnosis not present

## 2021-06-10 DIAGNOSIS — G40909 Epilepsy, unspecified, not intractable, without status epilepticus: Secondary | ICD-10-CM | POA: Diagnosis not present

## 2021-06-10 DIAGNOSIS — K5900 Constipation, unspecified: Secondary | ICD-10-CM | POA: Diagnosis not present

## 2021-06-10 DIAGNOSIS — I10 Essential (primary) hypertension: Secondary | ICD-10-CM | POA: Diagnosis not present

## 2021-06-11 ENCOUNTER — Non-Acute Institutional Stay: Payer: Medicare Other | Admitting: Primary Care

## 2021-06-11 ENCOUNTER — Other Ambulatory Visit: Payer: Self-pay

## 2021-06-11 VITALS — Ht 68.0 in | Wt 249.0 lb

## 2021-06-11 DIAGNOSIS — E119 Type 2 diabetes mellitus without complications: Secondary | ICD-10-CM | POA: Diagnosis not present

## 2021-06-11 DIAGNOSIS — Z515 Encounter for palliative care: Secondary | ICD-10-CM

## 2021-06-11 DIAGNOSIS — F79 Unspecified intellectual disabilities: Secondary | ICD-10-CM

## 2021-06-11 DIAGNOSIS — G40909 Epilepsy, unspecified, not intractable, without status epilepticus: Secondary | ICD-10-CM | POA: Diagnosis not present

## 2021-06-11 DIAGNOSIS — R5381 Other malaise: Secondary | ICD-10-CM

## 2021-06-11 NOTE — Progress Notes (Signed)
? ? ?[facility] ?[facility] Consult Note ?Telephone: [phone]  ?Fax: [phone]  ? ? ?Date of encounter: 06/11/21 ?1:21 PM ?PATIENT NAME: Mariah Jimenez ?[address] ?[place] [address]   ?[phone] (home)  ?DOB: [birth year] ?MRN: [id number] ?PRIMARY CARE PROVIDER:    ?Alvester Morin, MD,  ?[address]. ?[place] [address] ?[phone] ? ?REFERRING PROVIDER:   ?Alvester Morin, MD ?[address]. ?[place],  [place] [address] ?[phone] ? ?RESPONSIBLE PARTY:    ?Contact Information   ? ? Name Relation Home Work Mobile  ? Saydie, Gerdts Sister   [phone]  ? ?  ? ? ? ?I met face to face with patient in [facility] facility. Palliative Care was asked to follow this patient by consultation request of  Slade-Hartman, Ivette Loyal* to address advance care planning and complex medical decision making. This is a follow up visit. ? ?                                 ASSESSMENT AND PLAN / RECOMMENDATIONS:  ? ?Advance Care Planning/Goals of Care: Goals include to maximize quality of life and symptom management.  ?CODE STATUS: DNR ? ?Symptom Management/Plan: ? ?Patient resting in bed, sleeping a bit before am care and lunch. She awakens and begins to verbalize but not able to understand. She has been eating 50-76% of meals, and having bms. She has had a 40 lb wt loss in 1 year, which is 14%. She has had a 6% loss in 5 months. Body mass index is 37.86 kg/m?Marland Kitchen Down from 77.  ? ?Skin is intact and patient is at her baseline generally. ? ?Follow up Palliative Care Visit: Palliative care will continue to follow for complex medical decision making, advance care planning, and clarification of goals. Return 12 weeks or prn. ? ?I spent 15 minutes providing this consultation. More than 50% of the time in this consultation was spent in counseling and care coordination. ? ?PPS: 30% ? ?HOSPICE ELIGIBILITY/DIAGNOSIS: TBD ? ?Chief Complaint: debility ? ?HISTORY OF PRESENT  ILLNESS:  Mariah Jimenez is a 76 y.o. year old female  with debility, immobility, wt loss, intellectual disabilities.  ? ?History obtained from review of EMR, discussion with primary team, and interview with family, facility staff/caregiver and/or Ms. Stevick.  ?I reviewed available labs, medications, imaging, studies and related documents from the EMR.  Records reviewed and summarized above.  ? ?ROS ?/staff ? ?General: NAD ?ENMT: denies dysphagia ?Pulmonary: denies cough, denies increased SOB ?Abdomen: endorses good appetite, denies constipation, endorses incontinence of bowel ?GU: denies dysuria, endorses incontinence of urine ?MSK:  denies  increased weakness,  no falls reported ?Skin: denies rashes or wounds ?Neurological: denies pain, denies insomnia ?Psych: Endorses flat mood ?Heme/lymph/immuno: denies bruises, abnormal bleeding ? ?Physical Exam: ?Current and past weights: 249 lbs, 14 % loss in 1 year, 6% in 5 months ?Constitutional: NAD ?General: frail appearing, obese  ?EYES: anicteric sclera, lids intact, no discharge  ?ENMT: intact hearing, oral mucous membranes moist, dentition intact ?CV: S1S2, RRR, no LE edema ?Pulmonary: LCTA, no increased work of breathing, no cough, room air ?Abdomen: intake 75-100%, normo-active BS + 4 quadrants, soft and non tender, no ascites ?GU: deferred ?MSK: no sarcopenia, moves all extremities, non  ambulatory ?Skin: warm and dry, no rashes or wounds on visible skin ?Neuro:  no increased  generalized weakness, ++cognitive impairment ?Psych: flat affect, A and O x 1 ?Hem/lymph/immuno: no widespread bruising ?  Outpatient Encounter Medications as of 06/11/2021  ?Medication Sig  ? diazepam (VALIUM) 5 MG tablet Take 5 mg by mouth 3 (three) times daily as needed. For signs or symptoms of pending seizure activity  ? diazepam (VALIUM) 5 MG tablet Take 2.5 mg by mouth 2 (two) times daily.  ? ipratropium-albuterol (DUONEB) 0.5-2.5 (3) MG/3ML SOLN Take 3 mLs by nebulization every 4 (four)  hours as needed. PRN wheezing, SOB  ? lamoTRIgine (LAMICTAL) 25 MG tablet Take 25 mg by mouth 2 (two) times daily. Take 50 mg in the morning and 75 mg at bedtime.  ? levETIRAcetam (KEPPRA) 750 MG tablet Take 750 mg by mouth 2 (two) times daily.  ? loratadine (CLARITIN) 10 MG tablet Take 10 mg by mouth daily.  ? LORazepam (ATIVAN) 1 MG tablet Take 1 mg by mouth daily as needed. Special Instructions: Give 1 mg crushed and placed under her tongue for seize activity, may repeat in 30 minutes if seizures continue.  ? LORazepam (ATIVAN) 2 MG/ML injection Inject 1 mg into the muscle as needed. Repeat in 30 minutes as needed for seizure activity  ? Multiple Vitamin (MULTIVITAMIN WITH MINERALS) TABS tablet Take 1 tablet by mouth daily.  ? pantoprazole (PROTONIX) 40 MG tablet Take 1 tablet (40 mg total) by mouth daily.  ? PHENobarbital (LUMINAL) 32.4 MG tablet Take 1 tablet (32.4 mg total) by mouth 3 (three) times daily.  ? phenytoin (DILANTIN) 200 MG ER capsule Take 1 capsule (200 mg total) by mouth at bedtime. (Patient taking differently: Take 400 mg by mouth at bedtime.)  ? SPS 15 GM/60ML suspension Take 60 mLs by mouth every Wednesday.  ? VIMPAT 50 MG TABS tablet Take 50 mg by mouth 2 (two) times daily.  ? ?No facility-administered encounter medications on file as of 06/11/2021.  ? ? ? ?Thank you for the opportunity to participate in the care of Ms. Chamblee.  The palliative care team will continue to follow. Please call our office at [phone] if we can be of additional assistance.  ? ?Jason Coop, NP DNP, AGPCNP-BC ? ?COVID-19 PATIENT SCREENING TOOL ?Asked and negative response unless otherwise noted:  ? ?Have you had symptoms of covid, tested positive or been in contact with someone with symptoms/positive test in the past 5-10 days?  ? ?

## 2021-06-19 DIAGNOSIS — R8290 Unspecified abnormal findings in urine: Secondary | ICD-10-CM | POA: Diagnosis not present

## 2021-06-26 DIAGNOSIS — F79 Unspecified intellectual disabilities: Secondary | ICD-10-CM | POA: Diagnosis not present

## 2021-06-26 DIAGNOSIS — G40909 Epilepsy, unspecified, not intractable, without status epilepticus: Secondary | ICD-10-CM | POA: Diagnosis not present

## 2021-06-30 DIAGNOSIS — E1122 Type 2 diabetes mellitus with diabetic chronic kidney disease: Secondary | ICD-10-CM | POA: Diagnosis not present

## 2021-06-30 DIAGNOSIS — G40909 Epilepsy, unspecified, not intractable, without status epilepticus: Secondary | ICD-10-CM | POA: Diagnosis not present

## 2021-07-01 DIAGNOSIS — G40909 Epilepsy, unspecified, not intractable, without status epilepticus: Secondary | ICD-10-CM | POA: Diagnosis not present

## 2021-07-02 DIAGNOSIS — G40909 Epilepsy, unspecified, not intractable, without status epilepticus: Secondary | ICD-10-CM | POA: Diagnosis not present

## 2021-07-08 DIAGNOSIS — Z5181 Encounter for therapeutic drug level monitoring: Secondary | ICD-10-CM | POA: Diagnosis not present

## 2021-07-13 ENCOUNTER — Other Ambulatory Visit: Payer: Self-pay

## 2021-07-13 ENCOUNTER — Emergency Department
Admission: EM | Admit: 2021-07-13 | Discharge: 2021-07-13 | Disposition: A | Discharge status: Home / Self Care | Payer: Medicare Other | Source: Home / Self Care | Attending: Emergency Medicine | Admitting: Emergency Medicine

## 2021-07-13 DIAGNOSIS — Z66 Do not resuscitate: Secondary | ICD-10-CM | POA: Diagnosis present

## 2021-07-13 DIAGNOSIS — E662 Morbid (severe) obesity with alveolar hypoventilation: Secondary | ICD-10-CM | POA: Insufficient documentation

## 2021-07-13 DIAGNOSIS — F79 Unspecified intellectual disabilities: Secondary | ICD-10-CM | POA: Diagnosis present

## 2021-07-13 DIAGNOSIS — D649 Anemia, unspecified: Secondary | ICD-10-CM | POA: Insufficient documentation

## 2021-07-13 DIAGNOSIS — Z6837 Body mass index (BMI) 37.0-37.9, adult: Secondary | ICD-10-CM | POA: Diagnosis not present

## 2021-07-13 DIAGNOSIS — R4182 Altered mental status, unspecified: Secondary | ICD-10-CM | POA: Diagnosis not present

## 2021-07-13 DIAGNOSIS — R748 Abnormal levels of other serum enzymes: Secondary | ICD-10-CM | POA: Insufficient documentation

## 2021-07-13 DIAGNOSIS — R9431 Abnormal electrocardiogram [ECG] [EKG]: Secondary | ICD-10-CM | POA: Diagnosis not present

## 2021-07-13 DIAGNOSIS — G40909 Epilepsy, unspecified, not intractable, without status epilepticus: Secondary | ICD-10-CM | POA: Diagnosis not present

## 2021-07-13 DIAGNOSIS — R401 Stupor: Secondary | ICD-10-CM | POA: Diagnosis not present

## 2021-07-13 DIAGNOSIS — J449 Chronic obstructive pulmonary disease, unspecified: Secondary | ICD-10-CM | POA: Diagnosis present

## 2021-07-13 DIAGNOSIS — Z7401 Bed confinement status: Secondary | ICD-10-CM | POA: Diagnosis not present

## 2021-07-13 DIAGNOSIS — Z79899 Other long term (current) drug therapy: Secondary | ICD-10-CM | POA: Diagnosis not present

## 2021-07-13 DIAGNOSIS — I959 Hypotension, unspecified: Secondary | ICD-10-CM | POA: Diagnosis not present

## 2021-07-13 DIAGNOSIS — R569 Unspecified convulsions: Secondary | ICD-10-CM

## 2021-07-13 DIAGNOSIS — E669 Obesity, unspecified: Secondary | ICD-10-CM | POA: Diagnosis present

## 2021-07-13 DIAGNOSIS — G9341 Metabolic encephalopathy: Secondary | ICD-10-CM | POA: Diagnosis present

## 2021-07-13 DIAGNOSIS — N390 Urinary tract infection, site not specified: Secondary | ICD-10-CM | POA: Diagnosis present

## 2021-07-13 DIAGNOSIS — R404 Transient alteration of awareness: Secondary | ICD-10-CM | POA: Diagnosis not present

## 2021-07-13 DIAGNOSIS — R0689 Other abnormalities of breathing: Secondary | ICD-10-CM | POA: Diagnosis not present

## 2021-07-13 DIAGNOSIS — E1165 Type 2 diabetes mellitus with hyperglycemia: Secondary | ICD-10-CM | POA: Insufficient documentation

## 2021-07-13 DIAGNOSIS — R0902 Hypoxemia: Secondary | ICD-10-CM | POA: Diagnosis not present

## 2021-07-13 DIAGNOSIS — G245 Blepharospasm: Secondary | ICD-10-CM | POA: Diagnosis not present

## 2021-07-13 LAB — COMPREHENSIVE METABOLIC PANEL
ALT: 17 U/L (ref 0–44)
AST: 23 U/L (ref 15–41)
Albumin: 3 g/dL — ABNORMAL LOW (ref 3.5–5.0)
Alkaline Phosphatase: 212 U/L — ABNORMAL HIGH (ref 38–126)
Anion gap: 9 (ref 5–15)
BUN: 19 mg/dL (ref 8–23)
CO2: 25 mmol/L (ref 22–32)
Calcium: 8.9 mg/dL (ref 8.9–10.3)
Chloride: 106 mmol/L (ref 98–111)
Creatinine, Ser: 0.53 mg/dL (ref 0.44–1.00)
GFR, Estimated: >60 mL/min (ref >60)
Glucose, Bld: 142 mg/dL — ABNORMAL HIGH (ref 70–99)
Potassium: 4.1 mmol/L (ref 3.5–5.1)
Sodium: 140 mmol/L (ref 135–145)
Total Bilirubin: 0.4 mg/dL (ref 0.3–1.2)
Total Protein: 7.8 g/dL (ref 6.5–8.1)

## 2021-07-13 LAB — PHENOBARBITAL LEVEL: Phenobarbital: 39.1 ug/mL — ABNORMAL HIGH (ref 15.0–30.0)

## 2021-07-13 LAB — CBC WITH DIFFERENTIAL/PLATELET
Abs Immature Granulocytes: 0.01 10*3/uL (ref 0.00–0.07)
Basophils Absolute: 0 10*3/uL (ref 0.0–0.1)
Basophils Relative: 0 %
Eosinophils Absolute: 0.4 10*3/uL (ref 0.0–0.5)
Eosinophils Relative: 8 %
HCT: 35.3 % — ABNORMAL LOW (ref 36.0–46.0)
Hemoglobin: 10.9 g/dL — ABNORMAL LOW (ref 12.0–15.0)
Immature Granulocytes: 0 %
Lymphocytes Relative: 32 %
Lymphs Abs: 1.5 10*3/uL (ref 0.7–4.0)
MCH: 27.5 pg (ref 26.0–34.0)
MCHC: 30.9 g/dL (ref 30.0–36.0)
MCV: 89.1 fL (ref 80.0–100.0)
Monocytes Absolute: 0.2 10*3/uL (ref 0.1–1.0)
Monocytes Relative: 3 %
Neutro Abs: 2.8 10*3/uL (ref 1.7–7.7)
Neutrophils Relative %: 57 %
Platelets: 318 10*3/uL (ref 150–400)
RBC: 3.96 MIL/uL (ref 3.87–5.11)
RDW: 16.3 % — ABNORMAL HIGH (ref 11.5–15.5)
WBC: 4.9 10*3/uL (ref 4.0–10.5)
nRBC: 0 % (ref 0.0–0.2)

## 2021-07-13 LAB — CBG MONITORING, ED: Glucose-Capillary: 138 mg/dL — ABNORMAL HIGH (ref 70–99)

## 2021-07-13 MED ORDER — SODIUM CHLORIDE 0.9 % IV BOLUS
1000.0000 mL | Freq: Once | INTRAVENOUS | Status: AC
  Administered 2021-07-13: 1000 mL via INTRAVENOUS

## 2021-07-13 NOTE — ED Notes (Signed)
Patient incontinent of urine. Pericare performed. Dry brief applied. Awaiting EMS arrival for transport to [facility].  ?

## 2021-07-13 NOTE — ED Triage Notes (Signed)
Pt here via [facility] from [facility] with seizures. Pt has a hx of seizures, had 1 today lasting an hour. Staff gave ativan. Pt is nonverbal at baseline. Vitals wnl.  ?

## 2021-07-13 NOTE — ED Notes (Signed)
Called EMS for transport back to [facility] 

## 2021-07-13 NOTE — ED Provider Notes (Signed)
? ?[facility] ?Provider Note ? ? Event Date/Time  ? First MD Initiated Contact with Patient 07/13/21 1458   ?  (approximate) ?History  ?Seizures ? ?HPI ?Mariah Jimenez is a 76 y.o. female  with a PMHx of epilepsy, COPD, and non-verbal at baseline who presents for an episode of seizure activity reportedly lasting 1 hour and resolving after administration of ativan in her long term care facility. Pt non-verbal at baseline so history obtained from nursing staff and EMS. Denies patient missing any doses of her AEDs. Denies pt having decreased PO intake, sleep, or activity. ?  ?Physical Exam  ?Triage Vital Signs: ?ED Triage Vitals  ?Enc Vitals Group  ?   BP 07/13/21 1418 (!) 139/51  ?   Pulse Rate 07/13/21 1418 86  ?   Resp 07/13/21 1418 16  ?   Temp 07/13/21 1428 98 ?F (36.7 ?C)  ?   Temp Source 07/13/21 1428 Axillary  ?   SpO2 07/13/21 1418 98 %  ?   Weight 07/13/21 1418 248 lb 14.4 oz (112.9 kg)  ?   Height 07/13/21 1418 5\' 8"  (1.727 m)  ?   Head Circumference --   ?   Peak Flow --   ?   Pain Score 07/13/21 1418 0  ?   Pain Loc --   ?   Pain Edu? --   ?   Excl. in [place]? --   ? ?Most recent vital signs: ?Vitals:  ? 07/13/21 1700 07/13/21 1800  ?BP: 124/81 (!) 94/55  ?Pulse: 72 69  ?Resp: 15 18  ?Temp:    ?SpO2: 97% 97%  ? ?General: Awake, no distress ?CV:  Good peripheral perfusion.  ?Resp:  Normal effort.  ?Abd:  No distention.  ?Other:  Elderly morbidly obese female laying in bed. Nonverbal (baseline). ?ED Results / Procedures / Treatments  ?Labs ?(all labs ordered are listed, but only abnormal results are displayed) ?Labs Reviewed  ?CBC WITH DIFFERENTIAL/PLATELET - Abnormal; Notable for the following components:  ?    Result Value  ? Hemoglobin 10.9 (*)   ? HCT 35.3 (*)   ? RDW 16.3 (*)   ? All other components within normal limits  ?COMPREHENSIVE METABOLIC PANEL - Abnormal; Notable for the following components:  ? Glucose, Bld 142 (*)   ? Albumin 3.0 (*)   ? Alkaline Phosphatase 212 (*)   ? All  other components within normal limits  ?CBG MONITORING, ED - Abnormal; Notable for the following components:  ? Glucose-Capillary 138 (*)   ? All other components within normal limits  ?URINALYSIS, ROUTINE W REFLEX MICROSCOPIC  ? ?EKG ?ED ECG REPORT ?I, Naaman Plummer, the attending physician, personally viewed and interpreted this ECG. ?Date: 07/13/2021 ?EKG Time: 1418 ?Rate: 86 ?Rhythm: normal sinus rhythm ?QRS Axis: normal ?Intervals: normal ?ST/T Wave abnormalities: normal ?Narrative Interpretation: no evidence of acute ischemia ? ?PROCEDURES: ?Critical Care performed: No ?Procedures ?MEDICATIONS ORDERED IN ED: ?Medications  ?sodium chloride 0.9 % bolus 1,000 mL (0 mLs Intravenous Stopped 07/13/21 1724)  ? ?IMPRESSION / MDM / ASSESSMENT AND PLAN / ED COURSE  ?I reviewed the triage vital signs and the nursing notes. ?             ?               ?The patient is on the cardiac monitor to evaluate for evidence of arrhythmia and/or significant heart rate changes. ?Patient presents after recent seizure episode. ? ?Patient had slow  return to baseline mental and physical function per EMS. ?No immunosuppresion hx and had no preceding fever. ?No history of alcohol abuse or suspicion for toxin ingestion. ?Unlikely stroke, syncope. ?Unlikely infectious etiology. ?No preceding trauma. ? ?Workup: EKG, CMP, POCT glucose ?CBC positive for anemia to 10.9 which is slightly elevated from her baseline around 10 ?CMP shows elevated glucose at 142 and fits with patient's history of type 2 diabetes without any anion gap concerning for DKA.  Patient also has elevated alkaline phosphatase at 212 ?Field Interventions: ?Ativan ?ED Interventions: ?None ?Disposition: Discharge home with primary care follow up in next 24-48 hours with her neurologist, Dr. Melrose Nakayama ? ?  ?FINAL CLINICAL IMPRESSION(S) / ED DIAGNOSES  ? ?Final diagnoses:  ?Seizure ([place])  ?Seizure disorder ([place])  ? ?Rx / DC Orders  ? ?ED Discharge Orders   ? ? None  ? ?  ? ?Note:   This document was prepared using Dragon voice recognition software and may include unintentional dictation errors. ?  ?Naaman Plummer, MD ?07/13/21 1839 ? ?

## 2021-07-14 ENCOUNTER — Emergency Department: Payer: Medicare Other

## 2021-07-14 ENCOUNTER — Inpatient Hospital Stay
Admission: EM | Admit: 2021-07-14 | Discharge: 2021-07-17 | DRG: 100 | Disposition: A | Discharge status: Skilled Nursing Facility | Payer: Medicare Other | Source: Skilled Nursing Facility | Attending: Family Medicine | Admitting: Family Medicine

## 2021-07-14 DIAGNOSIS — Z6837 Body mass index (BMI) 37.0-37.9, adult: Secondary | ICD-10-CM

## 2021-07-14 DIAGNOSIS — Z66 Do not resuscitate: Secondary | ICD-10-CM | POA: Diagnosis present

## 2021-07-14 DIAGNOSIS — G40909 Epilepsy, unspecified, not intractable, without status epilepticus: Principal | ICD-10-CM | POA: Diagnosis present

## 2021-07-14 DIAGNOSIS — R569 Unspecified convulsions: Principal | ICD-10-CM

## 2021-07-14 DIAGNOSIS — N390 Urinary tract infection, site not specified: Secondary | ICD-10-CM | POA: Diagnosis present

## 2021-07-14 DIAGNOSIS — J449 Chronic obstructive pulmonary disease, unspecified: Secondary | ICD-10-CM | POA: Diagnosis present

## 2021-07-14 DIAGNOSIS — Z7401 Bed confinement status: Secondary | ICD-10-CM

## 2021-07-14 DIAGNOSIS — E669 Obesity, unspecified: Secondary | ICD-10-CM | POA: Diagnosis present

## 2021-07-14 DIAGNOSIS — E66813 Obesity, class 3: Secondary | ICD-10-CM | POA: Diagnosis present

## 2021-07-14 DIAGNOSIS — I959 Hypotension, unspecified: Secondary | ICD-10-CM | POA: Diagnosis not present

## 2021-07-14 DIAGNOSIS — Z79899 Other long term (current) drug therapy: Secondary | ICD-10-CM

## 2021-07-14 DIAGNOSIS — R9431 Abnormal electrocardiogram [ECG] [EKG]: Secondary | ICD-10-CM | POA: Diagnosis not present

## 2021-07-14 DIAGNOSIS — G9341 Metabolic encephalopathy: Secondary | ICD-10-CM

## 2021-07-14 DIAGNOSIS — F79 Unspecified intellectual disabilities: Secondary | ICD-10-CM

## 2021-07-14 LAB — URINALYSIS, ROUTINE W REFLEX MICROSCOPIC
Bilirubin Urine: NEGATIVE
Glucose, UA: NEGATIVE mg/dL
Hgb urine dipstick: NEGATIVE
Ketones, ur: 5 mg/dL — AB
Nitrite: NEGATIVE
Protein, ur: 100 mg/dL — AB
Specific Gravity, Urine: 1.02 (ref 1.005–1.030)
WBC, UA: >50 WBC/hpf — ABNORMAL HIGH (ref 0–5)
pH: 7 (ref 5.0–8.0)

## 2021-07-14 LAB — COMPREHENSIVE METABOLIC PANEL
ALT: 16 U/L (ref 0–44)
AST: 26 U/L (ref 15–41)
Albumin: 2.9 g/dL — ABNORMAL LOW (ref 3.5–5.0)
Alkaline Phosphatase: 213 U/L — ABNORMAL HIGH (ref 38–126)
Anion gap: 9 (ref 5–15)
BUN: 19 mg/dL (ref 8–23)
CO2: 28 mmol/L (ref 22–32)
Calcium: 9 mg/dL (ref 8.9–10.3)
Chloride: 104 mmol/L (ref 98–111)
Creatinine, Ser: 0.54 mg/dL (ref 0.44–1.00)
GFR, Estimated: >60 mL/min (ref >60)
Glucose, Bld: 142 mg/dL — ABNORMAL HIGH (ref 70–99)
Potassium: 4.6 mmol/L (ref 3.5–5.1)
Sodium: 141 mmol/L (ref 135–145)
Total Bilirubin: 0.5 mg/dL (ref 0.3–1.2)
Total Protein: 7.9 g/dL (ref 6.5–8.1)

## 2021-07-14 LAB — CBC WITH DIFFERENTIAL/PLATELET
Abs Immature Granulocytes: 0.02 10*3/uL (ref 0.00–0.07)
Basophils Absolute: 0 10*3/uL (ref 0.0–0.1)
Basophils Relative: 0 %
Eosinophils Absolute: 0.5 10*3/uL (ref 0.0–0.5)
Eosinophils Relative: 7 %
HCT: 34 % — ABNORMAL LOW (ref 36.0–46.0)
Hemoglobin: 10.6 g/dL — ABNORMAL LOW (ref 12.0–15.0)
Immature Granulocytes: 0 %
Lymphocytes Relative: 26 %
Lymphs Abs: 1.6 10*3/uL (ref 0.7–4.0)
MCH: 28 pg (ref 26.0–34.0)
MCHC: 31.2 g/dL (ref 30.0–36.0)
MCV: 89.9 fL (ref 80.0–100.0)
Monocytes Absolute: 0.4 10*3/uL (ref 0.1–1.0)
Monocytes Relative: 6 %
Neutro Abs: 3.8 10*3/uL (ref 1.7–7.7)
Neutrophils Relative %: 61 %
Platelets: 284 10*3/uL (ref 150–400)
RBC: 3.78 MIL/uL — ABNORMAL LOW (ref 3.87–5.11)
RDW: 16.5 % — ABNORMAL HIGH (ref 11.5–15.5)
WBC: 6.3 10*3/uL (ref 4.0–10.5)
nRBC: 0.3 % — ABNORMAL HIGH (ref 0.0–0.2)

## 2021-07-14 LAB — URINE DRUG SCREEN, QUALITATIVE (ARMC ONLY)
Amphetamines, Ur Screen: NOT DETECTED
Barbiturates, Ur Screen: POSITIVE — AB
Benzodiazepine, Ur Scrn: POSITIVE — AB
Cannabinoid 50 Ng, Ur ~~LOC~~: NOT DETECTED
Cocaine Metabolite,Ur ~~LOC~~: NOT DETECTED
MDMA (Ecstasy)Ur Screen: NOT DETECTED
Methadone Scn, Ur: NOT DETECTED
Opiate, Ur Screen: NOT DETECTED
Phencyclidine (PCP) Ur S: NOT DETECTED
Tricyclic, Ur Screen: NOT DETECTED

## 2021-07-14 LAB — CBG MONITORING, ED: Glucose-Capillary: 93 mg/dL (ref 70–99)

## 2021-07-14 LAB — TSH: TSH: 8.006 u[IU]/mL — ABNORMAL HIGH (ref 0.350–4.500)

## 2021-07-14 MED ORDER — NITROFURANTOIN MONOHYD MACRO 100 MG PO CAPS: 100.0000 mg | ORAL_CAPSULE | Freq: Two times a day (BID) | ORAL | Status: DC

## 2021-07-14 MED ORDER — ADULT MULTIVITAMIN W/MINERALS CH
1.0000 | ORAL_TABLET | Freq: Every day | ORAL | Status: DC
  Administered 2021-07-17: 1 via ORAL
  Filled 2021-07-14 (×2): qty 1

## 2021-07-14 MED ORDER — LAMOTRIGINE 25 MG PO TABS
75.0000 mg | ORAL_TABLET | Freq: Every day | ORAL | Status: DC
  Administered 2021-07-15 – 2021-07-16 (×2): 75 mg via ORAL
  Filled 2021-07-14 (×2): qty 3

## 2021-07-14 MED ORDER — LORAZEPAM 1 MG PO TABS: 1.0000 mg | ORAL_TABLET | Freq: Every day | ORAL | Status: DC | PRN

## 2021-07-14 MED ORDER — DIAZEPAM 5 MG PO TABS: 2.5000 mg | ORAL_TABLET | Freq: Two times a day (BID) | ORAL | Status: DC

## 2021-07-14 MED ORDER — LEVETIRACETAM 750 MG PO TABS
750.0000 mg | ORAL_TABLET | Freq: Two times a day (BID) | ORAL | Status: DC
  Filled 2021-07-14: qty 1

## 2021-07-14 MED ORDER — ONDANSETRON HCL 4 MG/2ML IJ SOLN: 4.0000 mg | Freq: Four times a day (QID) | INTRAMUSCULAR | Status: DC | PRN

## 2021-07-14 MED ORDER — SODIUM CHLORIDE 0.9 % IV SOLN
750.0000 mg | Freq: Two times a day (BID) | INTRAVENOUS | Status: DC
  Administered 2021-07-15 (×2): 750 mg via INTRAVENOUS
  Filled 2021-07-14 (×2): qty 7.5

## 2021-07-14 MED ORDER — SODIUM CHLORIDE 0.9 % IV SOLN: INTRAVENOUS | Status: DC

## 2021-07-14 MED ORDER — SODIUM CHLORIDE 0.9 % IV SOLN
50.0000 mg | Freq: Two times a day (BID) | INTRAVENOUS | Status: DC
  Administered 2021-07-15 (×2): 50 mg via INTRAVENOUS
  Filled 2021-07-14 (×3): qty 5

## 2021-07-14 MED ORDER — LACOSAMIDE 50 MG PO TABS: 50.0000 mg | ORAL_TABLET | Freq: Two times a day (BID) | ORAL | Status: DC

## 2021-07-14 MED ORDER — SODIUM CHLORIDE 0.9 % IV SOLN
1.0000 g | INTRAVENOUS | Status: DC
  Administered 2021-07-14 – 2021-07-16 (×3): 1 g via INTRAVENOUS
  Filled 2021-07-14: qty 1
  Filled 2021-07-14 (×2): qty 10

## 2021-07-14 MED ORDER — LAMOTRIGINE 100 MG PO TABS
50.0000 mg | ORAL_TABLET | Freq: Every day | ORAL | Status: DC
  Administered 2021-07-16 – 2021-07-17 (×2): 50 mg via ORAL
  Filled 2021-07-14 (×2): qty 1

## 2021-07-14 MED ORDER — CHLORHEXIDINE GLUCONATE 0.12% ORAL RINSE (MEDLINE KIT)
15.0000 mL | Freq: Two times a day (BID) | OROMUCOSAL | Status: DC
  Administered 2021-07-15 – 2021-07-17 (×4): 15 mL via OROMUCOSAL
  Filled 2021-07-14 (×3): qty 15

## 2021-07-14 MED ORDER — SODIUM CHLORIDE 0.9 % IV SOLN
300.0000 mg | Freq: Once | INTRAVENOUS | Status: AC
  Administered 2021-07-15: 300 mg via INTRAVENOUS
  Filled 2021-07-14: qty 6

## 2021-07-14 MED ORDER — PHENOBARBITAL SODIUM 65 MG/ML IJ SOLN
32.4000 mg | Freq: Once | INTRAMUSCULAR | Status: AC
  Administered 2021-07-15: 32.4 mg via INTRAVENOUS
  Filled 2021-07-14: qty 0.5

## 2021-07-14 MED ORDER — ACETAMINOPHEN 650 MG RE SUPP: 650.0000 mg | Freq: Four times a day (QID) | RECTAL | Status: DC | PRN

## 2021-07-14 MED ORDER — ACETAMINOPHEN 325 MG PO TABS: 650.0000 mg | ORAL_TABLET | Freq: Four times a day (QID) | ORAL | Status: DC | PRN

## 2021-07-14 MED ORDER — ONDANSETRON HCL 4 MG PO TABS: 4.0000 mg | ORAL_TABLET | Freq: Four times a day (QID) | ORAL | Status: DC | PRN

## 2021-07-14 MED ORDER — PHENOBARBITAL 32.4 MG PO TABS
32.4000 mg | ORAL_TABLET | Freq: Three times a day (TID) | ORAL | Status: DC
  Administered 2021-07-15 – 2021-07-17 (×6): 32.4 mg via ORAL
  Filled 2021-07-14 (×6): qty 1

## 2021-07-14 MED ORDER — LORAZEPAM 2 MG/ML IJ SOLN: 1.0000 mg | INTRAMUSCULAR | Status: DC | PRN

## 2021-07-14 MED ORDER — ORAL CARE MOUTH RINSE
15.0000 mL | OROMUCOSAL | Status: DC
  Administered 2021-07-15 – 2021-07-17 (×12): 15 mL via OROMUCOSAL
  Filled 2021-07-14 (×10): qty 15

## 2021-07-14 MED ORDER — PANTOPRAZOLE SODIUM 40 MG PO TBEC
40.0000 mg | DELAYED_RELEASE_TABLET | Freq: Every day | ORAL | Status: DC
  Administered 2021-07-16 – 2021-07-17 (×2): 40 mg via ORAL
  Filled 2021-07-14 (×2): qty 1

## 2021-07-14 MED ORDER — DIAZEPAM 5 MG/ML IJ SOLN
2.5000 mg | Freq: Two times a day (BID) | INTRAMUSCULAR | Status: DC
  Administered 2021-07-15: 2.5 mg via INTRAVENOUS
  Filled 2021-07-14: qty 2

## 2021-07-14 MED ORDER — PHENYTOIN SODIUM EXTENDED 100 MG PO CAPS
300.0000 mg | ORAL_CAPSULE | Freq: Every day | ORAL | Status: DC
  Administered 2021-07-15 – 2021-07-16 (×2): 300 mg via ORAL
  Filled 2021-07-14 (×2): qty 3

## 2021-07-14 MED ORDER — LORATADINE 10 MG PO TABS
10.0000 mg | ORAL_TABLET | Freq: Every day | ORAL | Status: DC
  Administered 2021-07-16 – 2021-07-17 (×2): 10 mg via ORAL
  Filled 2021-07-14 (×2): qty 1

## 2021-07-14 NOTE — ED Notes (Signed)
Requested ultrasound team to establish a new IV access for this pt  ?

## 2021-07-14 NOTE — ED Notes (Addendum)
Dr Maurine Minister notified of this pt's urinalysis results . ? ?Notified Foust,NP of this pt's u/a results.  ?Requested IV meds as pt not alert and failed dysphagia screen ?

## 2021-07-14 NOTE — Progress Notes (Signed)
Both arms assessed for IV access. No appropriate site found. Veins are too small or too deep for peripheral access; no appropriate site found for midline. Secure chat notification sent to RN. ?

## 2021-07-14 NOTE — Progress Notes (Addendum)
? ?      CROSS COVER NOTE ? ?NAME: Louellen Haldeman ?MRN: [id number] ?DOB : 12/12/1945 ? ? ? ?Date of Service ?  07/14/2021  ?HPI/Events of Note ?  Urinalysis resulted with "small" leukocytes present, ">50" WBCs, and "many" bacteria ?  ?Interventions ?  Plan: Asymptomatic bacteriuria in the setting of breakthrough seizures ? ?Discussed with pharmacy, past culture data indicate susceptibility to Nitrofurantoin ?Nitrofurantoin 100 mg PO BID ?Follow Urine culture data ? ?Update: At this time patient is not alert enough for PO. She is drowsy, awakes to voice but falls back asleep quickly. ? ?Ceftriaxone 1gm daily ?Scheduled seizure meds transitioned to IV as able, please review on rounds tomorrow ?   ? ? ?Bishop Limbo MHA, MSN, FNP-BC ?Nurse Practitioner ?[facility] ?[place] ?Pager [phone]  ?

## 2021-07-14 NOTE — ED Provider Notes (Signed)
? ?[facility] ?Provider Note ? ? ? Event Date/Time  ? First MD Initiated Contact with Patient 07/14/21 1509   ?  (approximate) ? ? ?History  ? ?Seizures ([facility] from [facility] for [facility]. Had witnessed seizure lasting 45 minutes yesterday and another one today lasting 25 minutes. Given 0.5 mg IM ativan PTA. Pt altered but mentating at baseline per EMS.) ? ? ?HPI ? ?Mariah Jimenez is a 76 y.o. female with history of epilepsy, COPD, nonverbal at baseline who comes in with concern for seizure activity.  Patient was seen here yesterday as well for 1 hour of seizure activity that stopped after giving Ativan.  Patient is nonverbal at baseline and it was confirmed the patient not missed any of her doses of AEDs.  On review of records patient is on  diazepam 2.5 mg twice daily, lamotrigine 50 in the morning and 75 mg at bedtime, Keppra 750 twice daily and Ativan as needed.  Phenytoin 300 at bedtime and phenobarbital 32.43 times daily and Vimpat 500 twice a day.  ? ?Unable to get full HPI from patient due to her baseline being nonverbal ? ? ? ? ?Physical Exam  ? ?Triage Vital Signs: ?ED Triage Vitals  ?Enc Vitals Group  ?   BP 07/14/21 1447 (!) 122/49  ?   Pulse Rate 07/14/21 1447 73  ?   Resp 07/14/21 1447 13  ?   Temp 07/14/21 1449 98.2 ?F (36.8 ?C)  ?   Temp Source 07/14/21 1449 Oral  ?   SpO2 07/14/21 1446 100 %  ?   Weight 07/14/21 1448 248 lb 14.4 oz (112.9 kg)  ?   Height 07/14/21 1448 5\' 8"  (1.727 m)  ?   Head Circumference --   ?   Peak Flow --   ?   Pain Score --   ?   Pain Loc --   ?   Pain Edu? --   ?   Excl. in [place]? --   ? ? ?Most recent vital signs: ?Vitals:  ? 07/14/21 1447 07/14/21 1449  ?BP: (!) 122/49   ?Pulse: 73   ?Resp: 13   ?Temp:  98.2 ?F (36.8 ?C)  ?SpO2: 100%   ? ? ? ?General: Awake, no distress.  ?CV:  Good peripheral perfusion.  ?Resp:  Normal effort.  ?Abd:  No distention.  ?Other:  Protruding lower lip that is at baseline for patient.  Patient is nonverbal and  does not follow commands but she is able to groan and respond to pain.  She holds her eyes tight when I try to examine her pupils to make sure they are reactive ? ? ?ED Results / Procedures / Treatments  ? ?Labs ?(all labs ordered are listed, but only abnormal results are displayed) ?Labs Reviewed  ?CBC WITH DIFFERENTIAL/PLATELET - Abnormal; Notable for the following components:  ?    Result Value  ? RBC 3.78 (*)   ? Hemoglobin 10.6 (*)   ? HCT 34.0 (*)   ? RDW 16.5 (*)   ? nRBC 0.3 (*)   ? All other components within normal limits  ?COMPREHENSIVE METABOLIC PANEL - Abnormal; Notable for the following components:  ? Glucose, Bld 142 (*)   ? Albumin 2.9 (*)   ? Alkaline Phosphatase 213 (*)   ? All other components within normal limits  ?CBG MONITORING, ED  ? ? ? ?EKG ? ?My interpretation of EKG: ? ?Normal sinus rate of 73 without any ST elevation or  T wave inversions, normal intervals except for type I AV block ? ?RADIOLOGY ?I have reviewed the CT head personally no new ICH ? ?IMPRESSION: ?1. No acute intracranial abnormality. ?2. Persistent complete left mastoid effusion and interval ?development of complete right mastoid effusion. Query right middle ?ear free fluid. ?  ? ? ?PROCEDURES: ? ?Critical Care performed: No ? ?.1-3 Lead EKG Interpretation ?Performed by: Concha Se, MD ?Authorized by: Concha Se, MD  ? ?  Interpretation: normal   ?  ECG rate:  70 ?  ECG rate assessment: normal   ?  Rhythm: sinus rhythm   ?  Ectopy: none   ?  Conduction: normal   ? ? ?MEDICATIONS ORDERED IN ED: ?Medications - No data to display ? ? ?IMPRESSION / MDM / ASSESSMENT AND PLAN / ED COURSE  ?I reviewed the triage vital signs and the nursing notes. ? ? ?Patient has a history of seizures now presenting with recurrent seizures in the past 24 hours.  Patient is at a facility has not had any missed medications.  She is on multiple medications.  Labs ordered evaluate for Electra abnormalities, AKI.  CT imaging ordered given unclear  if patient hit her head but seems less likely.  ? ?I did call the facility and they talked to the person who helps take care of her who states that patient had not had any seizures for over a year and just recently started having obvious breakthrough seizures even with her medications.  They reported that about a week ago her Dilantin level was actually high at 34.4 so they went down on it from 400-300 and the repeat level was 31 and they were planning to have it rechecked today. ? ?The daughter was also called and was very concerned that patient was sent home yesterday after having such a prolonged seizure and will prefer patient to be admitted for monitoring of the seizures ? ?I have discussed the case with neurologist Dr. Dierdre Harness who will review meds for right now she is continuing her home meds and monitoring her for continued seizures.  ? ?We will discuss with hospital team for admission ? ? ?The patient is on the cardiac monitor to evaluate for evidence of arrhythmia and/or significant heart rate changes. ? ?FINAL CLINICAL IMPRESSION(S) / ED DIAGNOSES  ? ?Final diagnoses:  ?Seizure ([facility])  ? ? ? ?Rx / DC Orders  ? ?ED Discharge Orders   ? ? None  ? ?  ? ? ? ?Note:  This document was prepared using Dragon voice recognition software and may include unintentional dictation errors. ?  ?Concha Se, MD ?07/14/21 1657 ? ?

## 2021-07-14 NOTE — H&P (Signed)
?History and Physical  ? ? ?Mariah Jimenez PQA:[id number] DOB: 1945-11-01 DOA: 07/14/2021 ? ?PCP: Keane Police, MD  ?Patient coming from: facility ? ?I have personally briefly reviewed patient's old medical records in [facility] Link ? ?Chief Complaint: break through sz ? ?HPI: Mariah Jimenez is a 76 y.o. female with medical history significant of  intellectual delay, epilepsy, COPD, nonverbal at baseline, who presents to ed due to recurrent prolonged break through seizures. Patient is currently on 4 AED and has been stable w/o sz cx 1 year until 3-4 weeks ago.  Patient over the last 48 hours has had prolonged breakthrough sz requiring use of all available prn 's at [facility]. Due to this patient was sent to ED yesterday for evaluation. Patient was evaluated and discharged home after no recurrent activity. Patient however had another episode of prolonged breakthrough sz today and returns to ED for further care. Patient nonverbal history taken from chart ? ? ?ED Course:  ?Afeb, hr 73, bp 122/49, sat 100% on ra  ?RTM:YTRZN  no st -t wave changes  ?Wbc: 6.3, Hgb 10.6( prior 10.9)  ?NA: 141, K 4.6, glu 142, alphos213( chronic elevation) ?CT head:  ?IMPRESSION: ?1. No acute intracranial abnormality. ?2. Persistent complete left mastoid effusion and interval ?development of complete right mastoid effusion. Query right middle ?ear free fluid. ? ? ?Review of Systems:unable to obtain due to nonverbal state  ? ?Past Medical History:  ?Diagnosis Date  ? Mental retardation   ? Seizures (HCC)   ? ? ?Past Surgical History:  ?Procedure Laterality Date  ? ESOPHAGOGASTRODUODENOSCOPY (EGD) WITH PROPOFOL N/A 10/10/2019  ? Procedure: ESOPHAGOGASTRODUODENOSCOPY (EGD) WITH PROPOFOL;  Surgeon: Pasty Spillers, MD;  Location: [facility] ENDOSCOPY;  Service: Endoscopy;  Laterality: N/A;  ? NO PAST SURGERIES    ? TIBIA IM NAIL INSERTION Right 04/09/2016  ? Procedure: INTRAMEDULLARY (IM) NAIL TIBIAL;  Surgeon: Christena Flake, MD;  Location:  [facility] ORS;  Service: Orthopedics;  Laterality: Right;  ? ? ? reports that she has never smoked. She has never used smokeless tobacco. She reports that she does not drink alcohol and does not use drugs. ? ?No Known Allergies ? ?Family History  ?Family history unknown: Yes  ? ? ?Prior to Admission medications   ?Medication Sig Start Date End Date Taking? Authorizing Provider  ?diazepam (VALIUM) 5 MG tablet Take 2.5 mg by mouth 2 (two) times daily.    [provider]  ?ipratropium-albuterol (DUONEB) 0.5-2.5 (3) MG/3ML SOLN Take 3 mLs by nebulization every 4 (four) hours as needed. PRN wheezing, SOB    [provider]  ?lamoTRIgine (LAMICTAL) 150 MG tablet Take 75 mg by mouth daily. Take 1/2 tablet (75mg ) at bedtime    [provider]  ?lamoTRIgine (LAMICTAL) 25 MG tablet Take 50 mg by mouth daily. Take (2) tablets once daily.    [provider]  ?levETIRAcetam (KEPPRA) 750 MG tablet Take 750 mg by mouth 2 (two) times daily.    [provider]  ?loratadine (CLARITIN) 10 MG tablet Take 10 mg by mouth daily.    [provider]  ?LORazepam (ATIVAN) 1 MG tablet Take 1 mg by mouth daily as needed. Special Instructions: Give 1 mg crushed and placed under her tongue for seize activity, may repeat in 30 minutes if seizures continue. 12/04/20   [provider]  ?LORazepam (ATIVAN) 2 MG/ML injection Inject 1 mg into the muscle as needed. Repeat in 30 minutes as needed for seizure activity 08/13/20   [provider]  ?Multiple Vitamin (MULTIVITAMIN WITH MINERALS) TABS tablet Take 1 tablet by mouth daily.    [provider]  ?pantoprazole (PROTONIX) 40 MG tablet Take 1 tablet (40 mg total) by mouth daily. 11/25/20   Lorin Glass, MD  ?PHENobarbital (LUMINAL) 32.4 MG tablet Take 1 tablet (32.4 mg total) by mouth 3 (three) times daily. 11/24/20   Lorin Glass, MD  ?phenytoin (DILANTIN) 300 MG ER capsule Take 300 mg by mouth at bedtime.    [provider]  ?SPS 15 GM/60ML suspension Take 60 mLs by mouth every Wednesday. 06/16/19   [provider]  ?VIMPAT 50 MG TABS tablet Take 50 mg by mouth 2 (two) times daily. 08/19/20   [provider]  ? ? ?Physical Exam: ?Vitals:  ? 07/14/21 1448 07/14/21 1449 07/14/21 1600 07/14/21 1645  ?BP:   (!) 121/47 125/61  ?Pulse:    72  ?Resp:   14 19  ?Temp:  98.2 ?F (36.8 ?C)    ?TempSrc:  Oral    ?SpO2:    100%  ?Weight: 112.9 kg     ?Height: 5\' 8"  (1.727 m)     ? ? ? ?Vitals:  ? 07/14/21 1448 07/14/21 1449 07/14/21 1600 07/14/21 1645  ?BP:   (!) 121/47 125/61  ?Pulse:    72  ?Resp:   14 19  ?Temp:  98.2 ?F (36.8 ?C)    ?TempSrc:  Oral    ?SpO2:    100%  ?Weight: 112.9 kg     ?Height: 5\' 8"  (1.727 m)     ?Constitutional: NAD, calm, comfortable attempts to communicate by nodding  ?Eyes:eomi lids and conjunctivae normal, difficult eye exam due pt cooperation ?ENMT: Mucous membranes are moist.  Will not open mouth, bottom lip with chronic protrusion ?Neck: normal, supple, no masses, no thyromegaly ?Respiratory: clear to auscultation bilaterally anterirorly, no wheezing, no crackles. Normal respiratory effort. No accessory muscle use.  ?Cardiovascular: Regular rate and rhythm, no murmurs / rubs / gallops. No extremity edema. Warm extremities ?Abdomen: no tenderness, no masses palpated. No hepatosplenomegaly. Bowel sounds positive.  ?Musculoskeletal: no clubbing / cyanosis. No joint deformity upper and lower extremities. Good ROM, no contractures. Normal muscle tone.  ?Skin: no rashes, lesions, ulcers. No induration ?Neurologic: CN grossly intact. Sensation intact, Strength 3+/5 in upper right , paresis on left  ?Psychiatric: Normal judgment and insight. Alert unable to assess orientation ? ?Labs on Admission: I have personally reviewed following labs and imaging studies ? ?CBC: ?Recent Labs  ?Lab 07/13/21 ?1421 07/14/21 ?1528  ?WBC 4.9 6.3  ?NEUTROABS 2.8 3.8  ?HGB 10.9* 10.6*  ?HCT 35.3* 34.0*  ?MCV 89.1  89.9  ?PLT 318 284  ? ?Basic Metabolic Panel: ?Recent Labs  ?Lab 07/13/21 ?1427 07/14/21 ?1528  ?NA 140 141  ?K 4.1 4.6  ?CL 106 104  ?CO2 25 28  ?GLUCOSE 142* 142*  ?BUN 19 19  ?CREATININE 0.53 0.54  ?CALCIUM 8.9 9.0  ? ?GFR: ?Estimated Creatinine Clearance: 81.3 mL/min (by C-G formula based on SCr of 0.54 mg/dL). ?Liver Function Tests: ?Recent Labs  ?Lab 07/13/21 ?1427 07/14/21 ?1528  ?AST 23 26  ?ALT 17 16  ?ALKPHOS 212* 213*  ?BILITOT 0.4 0.5  ?PROT 7.8 7.9  ?ALBUMIN 3.0* 2.9*  ? ?No results for input(s): LIPASE, AMYLASE in the last 168 hours. ?No results for input(s): AMMONIA in the last 168 hours. ?Coagulation Profile: ?No results for input(s): INR, PROTIME in the last 168 hours. ?Cardiac Enzymes: ?No results for input(s): CKTOTAL,  CKMB, CKMBINDEX, TROPONINI in the last 168 hours. ?BNP (last 3 results) ?No results for input(s): PROBNP in the last 8760 hours. ?HbA1C: ?No results for input(s): HGBA1C in the last 72 hours. ?CBG: ?Recent Labs  ?Lab 07/13/21 ?1521  ?GLUCAP 138*  ? ?Lipid Profile: ?No results for input(s): CHOL, HDL, LDLCALC, TRIG, CHOLHDL, LDLDIRECT in the last 72 hours. ?Thyroid Function Tests: ?No results for input(s): TSH, T4TOTAL, FREET4, T3FREE, THYROIDAB in the last 72 hours. ?Anemia Panel: ?No results for input(s): VITAMINB12, FOLATE, FERRITIN, TIBC, IRON, RETICCTPCT in the last 72 hours. ?Urine analysis: ?   ?Component Value Date/Time  ? COLORURINE YELLOW (A) 11/16/2020 1030  ? APPEARANCEUR HAZY (A) 11/16/2020 1030  ? LABSPEC 1.014 11/16/2020 1030  ? PHURINE 8.0 11/16/2020 1030  ? GLUCOSEU NEGATIVE 11/16/2020 1030  ? HGBUR MODERATE (A) 11/16/2020 1030  ? BILIRUBINUR NEGATIVE 11/16/2020 1030  ? KETONESUR NEGATIVE 11/16/2020 1030  ? PROTEINUR NEGATIVE 11/16/2020 1030  ? UROBILINOGEN 0.2 05/20/2013 0437  ? NITRITE NEGATIVE 11/16/2020 1030  ? LEUKOCYTESUR MODERATE (A) 11/16/2020 1030  ? ? ?Radiological Exams on Admission: ?CT HEAD WO CONTRAST (5MM) ? ?Result Date: 07/14/2021 ?CLINICAL DATA:   Seizure, new-onset, no history of trauma EXAM: CT HEAD WITHOUT CONTRAST TECHNIQUE: Contiguous axial images were obtained from the base of the skull through the vertex without intravenous contrast. RADIATION DOSE REDU

## 2021-07-15 ENCOUNTER — Other Ambulatory Visit: Payer: Self-pay

## 2021-07-15 DIAGNOSIS — Z7401 Bed confinement status: Secondary | ICD-10-CM | POA: Diagnosis not present

## 2021-07-15 DIAGNOSIS — N390 Urinary tract infection, site not specified: Secondary | ICD-10-CM | POA: Diagnosis present

## 2021-07-15 DIAGNOSIS — Z66 Do not resuscitate: Secondary | ICD-10-CM | POA: Diagnosis present

## 2021-07-15 DIAGNOSIS — G40909 Epilepsy, unspecified, not intractable, without status epilepticus: Secondary | ICD-10-CM | POA: Diagnosis present

## 2021-07-15 DIAGNOSIS — Z6837 Body mass index (BMI) 37.0-37.9, adult: Secondary | ICD-10-CM | POA: Diagnosis not present

## 2021-07-15 DIAGNOSIS — G9341 Metabolic encephalopathy: Secondary | ICD-10-CM | POA: Diagnosis present

## 2021-07-15 DIAGNOSIS — Z79899 Other long term (current) drug therapy: Secondary | ICD-10-CM | POA: Diagnosis not present

## 2021-07-15 DIAGNOSIS — J449 Chronic obstructive pulmonary disease, unspecified: Secondary | ICD-10-CM | POA: Diagnosis present

## 2021-07-15 DIAGNOSIS — E669 Obesity, unspecified: Secondary | ICD-10-CM | POA: Diagnosis present

## 2021-07-15 DIAGNOSIS — R569 Unspecified convulsions: Secondary | ICD-10-CM | POA: Diagnosis present

## 2021-07-15 DIAGNOSIS — I959 Hypotension, unspecified: Secondary | ICD-10-CM | POA: Diagnosis not present

## 2021-07-15 DIAGNOSIS — F79 Unspecified intellectual disabilities: Secondary | ICD-10-CM | POA: Diagnosis present

## 2021-07-15 LAB — BASIC METABOLIC PANEL
Anion gap: 9 (ref 5–15)
BUN: 16 mg/dL (ref 8–23)
CO2: 22 mmol/L (ref 22–32)
Calcium: 8.5 mg/dL — ABNORMAL LOW (ref 8.9–10.3)
Chloride: 111 mmol/L (ref 98–111)
Creatinine, Ser: 0.6 mg/dL (ref 0.44–1.00)
GFR, Estimated: >60 mL/min (ref >60)
Glucose, Bld: 86 mg/dL (ref 70–99)
Potassium: 5 mmol/L (ref 3.5–5.1)
Sodium: 142 mmol/L (ref 135–145)

## 2021-07-15 LAB — GLUCOSE, CAPILLARY
Glucose-Capillary: 91 mg/dL (ref 70–99)
Glucose-Capillary: 102 mg/dL — ABNORMAL HIGH (ref 70–99)

## 2021-07-15 LAB — CBC
HCT: 32.5 % — ABNORMAL LOW (ref 36.0–46.0)
Hemoglobin: 10.4 g/dL — ABNORMAL LOW (ref 12.0–15.0)
MCH: 27.7 pg (ref 26.0–34.0)
MCHC: 32 g/dL (ref 30.0–36.0)
MCV: 86.7 fL (ref 80.0–100.0)
Platelets: 265 10*3/uL (ref 150–400)
RBC: 3.75 MIL/uL — ABNORMAL LOW (ref 3.87–5.11)
RDW: 16.4 % — ABNORMAL HIGH (ref 11.5–15.5)
WBC: 5.5 10*3/uL (ref 4.0–10.5)
nRBC: 0 % (ref 0.0–0.2)

## 2021-07-15 LAB — PHENYTOIN LEVEL, FREE AND TOTAL
Phenytoin, Free: 3.3 ug/mL — ABNORMAL HIGH (ref 1.0–2.0)
Phenytoin, Total: 29.3 ug/mL — ABNORMAL HIGH (ref 10.0–20.0)

## 2021-07-15 MED ORDER — LACOSAMIDE 50 MG PO TABS
50.0000 mg | ORAL_TABLET | Freq: Two times a day (BID) | ORAL | Status: DC
  Administered 2021-07-15 – 2021-07-17 (×4): 50 mg via ORAL
  Filled 2021-07-15 (×4): qty 1

## 2021-07-15 MED ORDER — DIAZEPAM 5 MG PO TABS
2.5000 mg | ORAL_TABLET | Freq: Two times a day (BID) | ORAL | Status: DC
  Administered 2021-07-15 – 2021-07-17 (×4): 2.5 mg via ORAL
  Filled 2021-07-15 (×4): qty 1

## 2021-07-15 MED ORDER — LEVETIRACETAM 750 MG PO TABS
750.0000 mg | ORAL_TABLET | Freq: Two times a day (BID) | ORAL | Status: DC
  Administered 2021-07-15 – 2021-07-17 (×4): 750 mg via ORAL
  Filled 2021-07-15 (×4): qty 1

## 2021-07-15 NOTE — Hospital Course (Signed)
Mrs. Jorden is a 76 y.o. F with hx intellectual developmental delay, non-verbal at baseline, lives in [place], epilepsy, reactive airways disease, and obesity who presented with seizure. ? ?Had seizure 2 days ago, aborted with BZD, evaluated in ER, appeared benign, discharged back home and had another seizure on day of admission. ? ?In the ER, CTH was unremarkable, UA with new bacteria/pyuria.  Started on antibiotics, Neurology consulted. ?

## 2021-07-15 NOTE — NC FL2 (Signed)
?[place] MEDICAID FL2 LEVEL OF CARE SCREENING TOOL  ?  ? ?IDENTIFICATION  ?Patient Name: ?Mariah Jimenez Birthdate: 05/20/1945 Sex: female Admission Date (Current Location): ?07/14/2021  ?[place] and [place] Number: ? [place] ?  Facility and Address:  ?[facility], [address], [place], [place] [address] ?     Provider Number: ?[id number]  ?Attending Physician Name and Address:  ?Edwin Dada, * ? Relative Name and Phone Number:  ?Alanta Scobey (sister) [phone] ?   ?Current Level of Care: ?Hospital Recommended Level of Care: ?[place] Prior Approval Number: ?  ? ?Date Approved/Denied: ?  PASRR Number: ?  ? ?Discharge Plan: ?SNF ?  ? ?Current Diagnoses: ?Patient Active Problem List  ? Diagnosis Date Noted  ? Acute pyelonephritis 11/13/2020  ? Acute urinary retention 11/13/2020  ? Acute repetitive seizure ([place]) 11/13/2020  ? Seizure ([place]) 11/13/2020  ? 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 Gastric polyp   ? Class 3 obesity ([place]) 10/07/2019  ? Sinus tachycardia 10/05/2019  ? Pressure injury of skin 10/05/2019  ? Dehydration 10/05/2019  ? Intractable nausea and vomiting 10/04/2019  ? Intellectual disability 10/04/2019  ? Elevated lipase 10/04/2019  ? Seizure disorder ([place]) 08/18/2019  ? UTI (urinary tract infection) 08/18/2019  ? Hypothermia 08/18/2019  ? Acute respiratory failure with hypoxia ([place]) 08/18/2019  ? Palliative care encounter 10/06/2018  ? Debility 10/06/2018  ? Tibia/fibula fracture 04/09/2016  ? ? ?Orientation RESPIRATION BLADDER Height & Weight   ?  ?Self ? Normal Incontinent Weight: 112.9 kg ?Height:  $RemoveB'5\' 8"'qvcLpRwT$  (172.7 cm)  ?BEHAVIORAL SYMPTOMS/MOOD NEUROLOGICAL BOWEL NUTRITION STATUS  ?  Convulsions/Seizures Incontinent Diet (see discharge summary)  ?AMBULATORY STATUS COMMUNICATION OF NEEDS Skin   ?Total Care Non-Verbally Normal ?  ?  ?  ?    ?     ?     ? ? ?Personal Care Assistance Level of Assistance  ?Total care Bathing Assistance: Maximum  assistance ?  ?  ?Total Care Assistance: Maximum assistance  ? ?Functional Limitations Info  ?Sight, Hearing, Speech Sight Info: Impaired ?Hearing Info: Impaired ?Speech Info: Impaired  ? ? ?SPECIAL CARE FACTORS FREQUENCY  ?    ?  ?  ?  ?  ?  ?  ?   ? ? ?Contractures Contractures Info: Not present  ? ? ?Additional Factors Info  ?Code Status, Allergies Code Status Info: DNR ?Allergies Info: NKA ?  ?  ?  ?   ? ?Current Medications (07/15/2021):  This is the current hospital active medication list ?Current Facility-Administered Medications  ?Medication Dose Route Frequency Provider Last Rate Last Admin  ? acetaminophen (TYLENOL) tablet 650 mg  650 mg Oral Q6H PRN Clance Boll, MD      ? Or  ? acetaminophen (TYLENOL) suppository 650 mg  650 mg Rectal Q6H PRN Clance Boll, MD      ? cefTRIAXone (ROCEPHIN) 1 g in sodium chloride 0.9 % 100 mL IVPB  1 g Intravenous Q24H Jone Baseman, NP   Stopped at 07/15/21 0116  ? chlorhexidine gluconate (MEDLINE KIT) (PERIDEX) 0.12 % solution 15 mL  15 mL Mouth Rinse BID Myles Rosenthal A, MD      ? diazepam (VALIUM) injection 2.5 mg  2.5 mg Intravenous BID Foust, Katy L, NP   2.5 mg at 07/15/21 0957  ? lacosamide (VIMPAT) 50 mg in sodium chloride 0.9 % 25 mL IVPB  50 mg Intravenous Q12H Foust, Katy L, NP   Stopped at 07/15/21 0945  ?  lamoTRIgine (LAMICTAL) tablet 50 mg  50 mg Oral Daily Clance Boll, MD      ? lamoTRIgine (LAMICTAL) tablet 75 mg  75 mg Oral QHS Myles Rosenthal A, MD      ? levETIRAcetam (KEPPRA) 750 mg in sodium chloride 0.9 % 100 mL IVPB  750 mg Intravenous Q12H Foust, Katy L, NP   Stopped at 07/15/21 1110  ? loratadine (CLARITIN) tablet 10 mg  10 mg Oral Daily Myles Rosenthal A, MD      ? LORazepam (ATIVAN) injection 1 mg  1 mg Intramuscular PRN Clance Boll, MD      ? LORazepam (ATIVAN) tablet 1 mg  1 mg Oral Daily PRN Clance Boll, MD      ? MEDLINE mouth rinse  15 mL Mouth Rinse 10 times per day Clance Boll, MD   15  mL at 07/15/21 1335  ? multivitamin with minerals tablet 1 tablet  1 tablet Oral Daily Clance Boll, MD      ? ondansetron [facility]) tablet 4 mg  4 mg Oral Q6H PRN Clance Boll, MD      ? Or  ? ondansetron (ZOFRAN) injection 4 mg  4 mg Intravenous Q6H PRN Clance Boll, MD      ? pantoprazole (PROTONIX) EC tablet 40 mg  40 mg Oral Daily Clance Boll, MD      ? PHENobarbital (LUMINAL) tablet 32.4 mg  32.4 mg Oral TID Clance Boll, MD      ? phenytoin (DILANTIN) ER capsule 300 mg  300 mg Oral QHS Clance Boll, MD      ? ?Current Outpatient Medications  ?Medication Sig Dispense Refill  ? diazepam (VALIUM) 5 MG tablet Take 2.5 mg by mouth 2 (two) times daily.    ? ipratropium-albuterol (DUONEB) 0.5-2.5 (3) MG/3ML SOLN Take 3 mLs by nebulization every 4 (four) hours as needed. PRN wheezing, SOB    ? lamoTRIgine (LAMICTAL) 150 MG tablet Take 75 mg by mouth daily. Take 1/2 tablet ($RemoveBef'75mg'WrlfQbGgjN$ ) at bedtime    ? lamoTRIgine (LAMICTAL) 25 MG tablet Take 50 mg by mouth daily. Take (2) tablets once daily.    ? levETIRAcetam (KEPPRA) 750 MG tablet Take 750 mg by mouth 2 (two) times daily.    ? loratadine (CLARITIN) 10 MG tablet Take 10 mg by mouth daily.    ? LORazepam (ATIVAN) 1 MG tablet Take 1 mg by mouth daily as needed. Special Instructions: Give 1 mg crushed and placed under her tongue for seize activity, may repeat in 30 minutes if seizures continue.    ? LORazepam (ATIVAN) 2 MG/ML injection Inject 1 mg into the muscle as needed. Repeat in 30 minutes as needed for seizure activity    ? Multiple Vitamin (MULTIVITAMIN WITH MINERALS) TABS tablet Take 1 tablet by mouth daily.    ? pantoprazole (PROTONIX) 40 MG tablet Take 1 tablet (40 mg total) by mouth daily.  0  ? PHENobarbital (LUMINAL) 32.4 MG tablet Take 1 tablet (32.4 mg total) by mouth 3 (three) times daily.    ? phenytoin (DILANTIN) 300 MG ER capsule Take 300 mg by mouth at bedtime.    ? SPS 15 GM/60ML suspension Take 60 mLs by mouth  every Wednesday.    ? VIMPAT 50 MG TABS tablet Take 50 mg by mouth 2 (two) times daily.    ? ? ? ?Discharge Medications: ?Please see discharge summary for a list of discharge medications. ? ?Relevant Imaging Results: ? ?  Relevant Lab Results: ? ? ?Additional Information ?SSN: [id number] ? ?Shelbie Hutching, RN ? ? ? ? ?

## 2021-07-15 NOTE — Assessment & Plan Note (Addendum)
No further seizures here.   ?- Consult Neurology ?-Continue Vimpat, Lamictal, Keppra, phenobarbital, Dilantin, scheduled Valium at home doses ?- Treat underlying infection ?

## 2021-07-15 NOTE — Assessment & Plan Note (Signed)
Likely this was the trigger of her seizures. ?- Continue Rocephin, follow urine culture ?

## 2021-07-15 NOTE — ED Notes (Signed)
Pt brief checked for urine and stool due to incontinence. Pt dry at this time ?

## 2021-07-15 NOTE — ED Notes (Signed)
Attempted blood draw times two. Pt has 22g [place] IV. Will attempt to get a hold of phlebotomy for attempt ?

## 2021-07-15 NOTE — Progress Notes (Signed)
Admission profile updated. ?

## 2021-07-15 NOTE — Progress Notes (Signed)
?  Progress Note ? ? ?Patient: Mariah Jimenez [id number] DOB: Oct 16, 1945 DOA: 07/14/2021     0 ?DOS: the patient was seen and examined on 07/15/2021 ?  ?Brief hospital course: ?Mariah Jimenez is a 76 y.o. F with hx intellectual developmental delay, non-verbal at baseline, lives in [place], epilepsy, reactive airways disease, and obesity who presented with seizure. ? ?Had seizure 2 days ago, aborted with BZD, evaluated in ER, appeared benign, discharged back home and had another seizure on day of admission. ? ?In the ER, CTH was unremarkable, UA with new bacteria/pyuria.  Started on antibiotics, Neurology consulted. ? ?Assessment and Plan: ?* Seizure ([place]) ?No further seizures here.   ?- Consult Neurology ?-Continue Vimpat, Lamictal, Keppra, phenobarbital, Dilantin, scheduled Valium ?- Treat underlying infection ? ?UTI (urinary tract infection) ?Likely this was the trigger of her seizures. ?- Continue Rocephin, follow urine culture ? ? ? ? ?  ? ?Subjective: Patient is nonverbal.  Nursing report no seizures, fever, respiratory distress, diarrhea. ? ?Physical Exam: ?Vitals:  ? 07/15/21 0613 07/15/21 0800 07/15/21 0900 07/15/21 1202  ?BP: 140/70 (!) 103/52 132/61 135/66  ?Pulse: (!) 57 (!) 56 (!) 57 (!) 56  ?Resp: 14 18 12 13   ?Temp:   98.5 ?F (36.9 ?C) 98 ?F (36.7 ?C)  ?TempSrc:   Oral Oral  ?SpO2: 94% 95% 94% 96%  ?Weight:      ?Height:      ? ?Obese adult female, sleeping, snoring, stirs sluggishly to stimulation. ?RRR, no murmurs, heart sounds distant due to body habitus, no pitting edema ?Respiratory rate normal, snoring with sleep, lung sounds distant due to body habitus ?Abdomen without grimace to palpation ?Does not follow commands, nonverbal at baseline. ? ? ? ? ? ?Data Reviewed: ?Nursing notes reviewed, vital signs reviewed ?CT head report reviewed, no acute intracranial process ?Basic metabolic panel and CBC normal ?Urinalysis with pyuria and bacteria ? ?Family Communication: sister by  phone ? ?Disposition: ?Status is: Inpatient ? ? Planned Discharge Destination: Skilled nursing facility ? ? ? ?  ? ?Author: ?Edwin Dada, MD ?07/15/2021 3:38 PM ? ?For on call review [email or web address]  ?

## 2021-07-16 DIAGNOSIS — G9341 Metabolic encephalopathy: Secondary | ICD-10-CM

## 2021-07-16 DIAGNOSIS — R569 Unspecified convulsions: Secondary | ICD-10-CM | POA: Diagnosis not present

## 2021-07-16 LAB — LAMOTRIGINE LEVEL: Lamotrigine Lvl: <1 ug/mL — ABNORMAL LOW (ref 2.0–20.0)

## 2021-07-16 LAB — AMMONIA: Ammonia: 35 umol/L (ref 9–35)

## 2021-07-16 LAB — GLUCOSE, CAPILLARY: Glucose-Capillary: 87 mg/dL (ref 70–99)

## 2021-07-16 LAB — LEVETIRACETAM LEVEL: Levetiracetam Lvl: 17.1 ug/mL (ref 10.0–40.0)

## 2021-07-16 LAB — MRSA NEXT GEN BY PCR, NASAL: MRSA by PCR Next Gen: NOT DETECTED

## 2021-07-16 MED ORDER — SODIUM CHLORIDE 0.9 % IV SOLN: INTRAVENOUS | Status: AC

## 2021-07-16 NOTE — TOC Progression Note (Signed)
Transition of Care (TOC) - Progression Note  ? ? ?Patient Details  ?Name: Mariah Jimenez ?MRN: [id number] ?Date of Birth: [birth year] ? ?Transition of Care (TOC) CM/SW Contact  ?Caryn Section, RN ?Phone Number: ?07/16/2021, 1:17 PM ? ?Clinical Narrative:   Hospitalist requested update from facility for patient's baseline.  Cala Bradford, Nursing Supervisor stated that patient is non verbal except for some sounds and words on occasion.  She is only alert to self.  She is awake during the day and sits up in a wheelchair.  She feeds herself, with a fairly good appetite during the day.  They held her Ativan prior to sending her here because she was sleepy.   ? ?Information relayed to Hospitalist and primary nurse. ? ? ?Expected Discharge Plan: Skilled Nursing Facility ?Barriers to Discharge: Continued Medical Work up ? ?Expected Discharge Plan and Services ?Expected Discharge Plan: Skilled Nursing Facility ?  ?Discharge Planning Services: CM Consult ?Post Acute Care Choice: Resumption of Svcs/PTA Provider ?Living arrangements for the past 2 months: Skilled Nursing Facility ?                ?DME Arranged: N/A ?DME Agency: NA ?  ?  ?  ?HH Arranged: NA ?HH Agency: NA ?  ?  ?  ? ? ?Social Determinants of Health (SDOH) Interventions ?  ? ?Readmission Risk Interventions ?   ? View : No data to display.  ?  ?  ?  ? ? ?

## 2021-07-16 NOTE — Assessment & Plan Note (Signed)
At baseline the patient is interactive, participates with cares, feeds herself.  Here, she is somnolent, must be fed, cannot stand. ? ?There are no signs of systemic illness, respiratory failure or renal failure.  I suspect rather she has decreased mental function due to loading of AEDs on admission, as well as UTI.  This seems to be improving with switching back to home AEDs and treating UTI. ?- Fluids ovenright ?- Check B12, TSH, Ammonia ?- PT eval and SLP eval  ?

## 2021-07-16 NOTE — Progress Notes (Signed)
?  Progress Note ? ? ?Patient: Mariah Jimenez [id number] DOB: 11-Dec-1945 DOA: 07/14/2021     1 ?DOS: the patient was seen and examined on 07/16/2021 ?  ?Brief hospital course: ?Mariah Jimenez is a 76 y.o. F with hx intellectual developmental delay, non-verbal at baseline, lives in [place], epilepsy, reactive airways disease, and obesity who presented with seizure. ? ?Had seizure 2 days ago, aborted with BZD, evaluated in ER, appeared benign, discharged back home and had another seizure on day of admission. ? ?In the ER, CTH was unremarkable, UA with new bacteria/pyuria.  Started on antibiotics, Neurology consulted. ? ?Assessment and Plan: ?* Seizure ([place]) ?No further seizures here.   ?- Consult Neurology ?-Continue Vimpat, Lamictal, Keppra, phenobarbital, Dilantin, scheduled Valium at home doses ?- Treat underlying infection ? ?UTI (urinary tract infection) ?Likely this was the trigger of her seizures. ?- Continue Rocephin, follow urine culture ? ?Acute metabolic encephalopathy ?At baseline the patient is interactive, participates with cares, feeds herself.  Here, she is somnolent, must be fed, cannot stand. ? ?There are no signs of systemic illness, respiratory failure or renal failure.  I suspect rather she has decreased mental function due to loading of AEDs on admission, as well as UTI.  This seems to be improving with switching back to home AEDs and treating UTI. ?- Fluids ovenright ?- Check B12, TSH, Ammonia ?- PT eval and SLP eval  ? ?Class 3 obesity (HCC) ?  ? ?Intellectual disability ?  ? ? ? ? ?  ? ?Subjective: Patient is nonverbal.  She had no fevers, respiratory distress.  Nursing report no vomiting.  She is slowly more awake but, sleeps less started and woken up, unable to follow commands, unable to feed herself. ? ? ? ? ?Physical Exam: ?Vitals:  ? 07/15/21 2109 07/16/21 0408 07/16/21 0541 07/16/21 0746  ?BP: 110/73 134/70  (!) 110/52  ?Pulse: (!) 55 (!) 56  (!) 54  ?Resp: 18 16  15   ?Temp: 97.8 ?F (36.6 ?C)   97.9 ?F (36.6 ?C) 97.9 ?F (36.6 ?C)  ?TempSrc: Axillary  Axillary   ?SpO2: 100% 100%  100%  ?Weight:      ?Height:      ? ?Obese adult female, lying in bed, sleeping, snoring, difficult to arouse.  When aroused, she makes eye contact, but does not follow commands, unable to feed herself.  Lung sounds are diminished due to body habitus, respiratory effort appears normal.  Heart sounds are diminished due to body habitus, but I do not appreciate murmurs or tachycardia or irregularity.  There is no pitting edema in the periphery.  She is sleepy, but when aroused makes eye contact, eats food with nursing when fed, but is total care. ? ? ? ? ? ?Data Reviewed: ?Nursing notes reviewed, vital signs reviewed. ? ? ?Family Communication: Sister by phone ? ?Disposition: ?Status is: Inpatient ?The patient was admitted with seizures which were likely triggered by UTI. ? ?At present she has had no further seizures, but she is still somnolent and not at her cognitive baseline. ? ?We will give fluids, continue her home medicines, continue treatment of her UTI, and check B12, ammonia, and TSH. ? ?If these are all normal, and her mental status is still improving tomorrow, likely back to her long-term care. ? ? ? ? ? ? ? ? Planned Discharge Destination: Skilled nursing facility ? ? ? ?Time spent:  minutes ? ?Author: ?Edwin Dada, MD ?07/16/2021 2:49 PM ? ?For on call review [email or web address]  ?

## 2021-07-16 NOTE — TOC Progression Note (Signed)
Transition of Care (TOC) - Progression Note  ? ? ?Patient Details  ?Name: Mariah Jimenez ?MRN: [id number] ?Date of Birth: [birth year] ? ?Transition of Care (TOC) CM/SW Contact  ?Pete Pelt, RN ?Phone Number: ?07/16/2021, 8:57 AM ? ?Clinical Narrative:   Patient will be transported to [facility], as per [facility] [employer].  Patient and family aware.  Patient will be transported by [facility] ? ? ? ?Expected Discharge Plan: [place] ?Barriers to Discharge: Continued Medical Work up ? ?Expected Discharge Plan and Services ?Expected Discharge Plan: [place] ?  ?Discharge Planning Services: CM Consult ?Post Acute Care Choice: Resumption of Svcs/PTA Provider ?Living arrangements for the past 2 months: [place] ?                ?DME Arranged: N/A ?DME Agency: NA ?  ?  ?  ?HH Arranged: NA ?[place] Agency: NA ?  ?  ?  ? ? ?Social Determinants of Health (SDOH) Interventions ?  ? ?Readmission Risk Interventions ?   ? View : No data to display.  ?  ?  ?  ? ? ?

## 2021-07-17 DIAGNOSIS — R569 Unspecified convulsions: Secondary | ICD-10-CM | POA: Diagnosis not present

## 2021-07-17 LAB — COMPREHENSIVE METABOLIC PANEL
ALT: 17 U/L (ref 0–44)
AST: 28 U/L (ref 15–41)
Albumin: 2.5 g/dL — ABNORMAL LOW (ref 3.5–5.0)
Alkaline Phosphatase: 184 U/L — ABNORMAL HIGH (ref 38–126)
Anion gap: 9 (ref 5–15)
BUN: 19 mg/dL (ref 8–23)
CO2: 24 mmol/L (ref 22–32)
Calcium: 8.7 mg/dL — ABNORMAL LOW (ref 8.9–10.3)
Chloride: 109 mmol/L (ref 98–111)
Creatinine, Ser: 0.95 mg/dL (ref 0.44–1.00)
GFR, Estimated: >60 mL/min (ref >60)
Glucose, Bld: 140 mg/dL — ABNORMAL HIGH (ref 70–99)
Potassium: 5.1 mmol/L (ref 3.5–5.1)
Sodium: 142 mmol/L (ref 135–145)
Total Bilirubin: 0.4 mg/dL (ref 0.3–1.2)
Total Protein: 6.9 g/dL (ref 6.5–8.1)

## 2021-07-17 LAB — CBC
HCT: 32 % — ABNORMAL LOW (ref 36.0–46.0)
Hemoglobin: 10.2 g/dL — ABNORMAL LOW (ref 12.0–15.0)
MCH: 28.4 pg (ref 26.0–34.0)
MCHC: 31.9 g/dL (ref 30.0–36.0)
MCV: 89.1 fL (ref 80.0–100.0)
Platelets: 273 10*3/uL (ref 150–400)
RBC: 3.59 MIL/uL — ABNORMAL LOW (ref 3.87–5.11)
RDW: 17.1 % — ABNORMAL HIGH (ref 11.5–15.5)
WBC: 4.8 10*3/uL (ref 4.0–10.5)
nRBC: 0 % (ref 0.0–0.2)

## 2021-07-17 LAB — URINE CULTURE: Culture: >=100000 — AB

## 2021-07-17 LAB — VITAMIN B12: Vitamin B-12: 2409 pg/mL — ABNORMAL HIGH (ref 180–914)

## 2021-07-17 LAB — RPR: RPR Ser Ql: NONREACTIVE

## 2021-07-17 MED ORDER — CEFDINIR 300 MG PO CAPS: 300.0000 mg | ORAL_CAPSULE | Freq: Two times a day (BID) | ORAL | 0 refills | Status: DC

## 2021-07-17 NOTE — Progress Notes (Signed)
PT Cancellation Note ? ?Patient Details ?Name: Ambyr Qadri ?MRN: [id number] ?DOB: 1945-06-18 ? ? ?Cancelled Treatment:    Reason Eval/Treat Not Completed: Other (comment) Patient is bed bound at baseline, total care for ADL, living in facility. Called and spoke with [facility] facility to confirm baseline level of function. Patient is at her baseline for basic mobility, no acute PT needs at this time. PT signing off at this time. Thank you!  ? ?Angelica Ran, PT  ?07/17/21. 10:20 AM ? ?

## 2021-07-17 NOTE — Evaluation (Signed)
Clinical/Bedside Swallow Evaluation ?Patient Details  ?Name: Mariah Jimenez ?MRN: [id number] ?Date of Birth: 04-Mar-1946 ? ?Today's Date: 07/17/2021 ?Time: SLP Start Time (ACUTE ONLY): 1030 SLP Stop Time (ACUTE ONLY): 1125 ?SLP Time Calculation (min) (ACUTE ONLY): 55 min ? ?Past Medical History:  ?Past Medical History:  ?Diagnosis Date  ? Mental retardation   ? Seizures (HCC)   ? ?Past Surgical History:  ?Past Surgical History:  ?Procedure Laterality Date  ? ESOPHAGOGASTRODUODENOSCOPY (EGD) WITH PROPOFOL N/A 10/10/2019  ? Procedure: ESOPHAGOGASTRODUODENOSCOPY (EGD) WITH PROPOFOL;  Surgeon: Pasty Spillers, MD;  Location: [facility] ENDOSCOPY;  Service: Endoscopy;  Laterality: N/A;  ? NO PAST SURGERIES    ? TIBIA IM NAIL INSERTION Right 04/09/2016  ? Procedure: INTRAMEDULLARY (IM) NAIL TIBIAL;  Surgeon: Christena Flake, MD;  Location: [facility] ORS;  Service: Orthopedics;  Laterality: Right;  ? ?HPI:  ?Pt is a 76 y.o. female  with a PMHx of epilepsy, COPD, Obesity, and non-verbal at baseline who presents for an episode of seizure activity reportedly lasting 1 hour and resolving after administration of ativan in her long term care facility. Pt non-verbal at baseline so history obtained from nursing staff and EMS who denied patient missing any doses of her AEDs. Denied pt having decreased PO intake, sleep, or activity.  Per report from PT, pt is bed bound at baseline, total care for ADLs, living in facility.  Head CT negative.  Lung sounds are diminished due to body habitus, respiratory effort appears normal per MD note.   Pt has been drowsy and unable to feed self; NSG reported concern re: being able to manage an oral diet of Regular consistency/solid foods.  ?Pt has been seen by ST services b/f when she admitted w/ s/s of N/V; gastritis. She has been recommended a Pureed diet consistency w/ thin liquids at previous admits. ? ?  ?Assessment / Plan / Recommendation  ?Clinical Impression ? BSE completed this morning. NSG has been  concerned re: pt's ability to manage current Regular diet consistency of solid foods. Pt is awake, less drowsy today. Suspect some impact from recent Seizure activity, meds at admit(reason for admit from her facility).  ?Pt is Edentulous at Baseline and presents w/ Macrocheilia -- this presentation was noted in previous ST assessments in 2021, 2022. She has intellectual/cognitive decline; is nonverbal but uses phonations in her attempts to communicate. Pt requires full assistance for ADLs at her living facility; she did not attempt to self-feed at this assessment today. She required full assistance and verbal cues in positioning upright in bed; she gave minimal assistance to support self more forward/upright during positioning.  ? ?Oral care completed; min excessive saliva noted w/ min drooling and macrocheilia-- lingual position more forward but no unilateral asymmetry nor weakness noted.  ?Pt fed trials of thin liquids, ice chips, and purees w/ No pharyngeal phase dysphagia noted; no overt clinical s/s of aspiration, no decline in respiratory presentation during/after po's. Pharyngeal swallows appeared complete. Audible swallows noted during more impulsive drinking requiring Pinched straw by this Clinician to limit bolus size/frequency. Oral phase deficits noted c/b slow bolus management, lingual pumping and/or oral holding(~10-20 secs at times); seemed to increased as trials continued(fatigue factor?). Suspect impact on oral phase secondary to the intellectual/cognitive status/decline baseline. This was similarly noted at previous evaluations. Given Time, she completed A-P transfer, swallow, and oral clearing. Full feeding support given. Alternated foods/liquids to aid oral clearing. Noted pt was able to pull lower lip upward to close around utensil/straw during intake given  time/tactile cues for awareness.  ? ?Recommend modifying current regular diet to a dysphagia level 1 (PUREE) w/ Thin liquids in setting of  Baseline dysphagia(oral phase) per previous assessments and risk for aspiration w/ impact from intellectual/cognitive status. Recommend general aspiration precautions; monitoring for oral clearing; feeding assistance and supervision w/ all oral intake. Pills Crushed in puree. MD and NSG updated. Precautions posted in room; reviewed w/ NSG/NT. ST services can follow pt at discharge w/ any further swallow/diet upgrade needs. The above diet consistency is felt to be the safest most efficient diet consistency for pt d/t Baseline status and risk for aspiration as well as its ability to provide pt w/ an effective consistency to meet nutrition/hydration needs safely. Recommend Dietician f/u.  ?SLP Visit Diagnosis: Dysphagia, oral phase (R13.11) (declined intellectual/cognitive status) ?   ?Aspiration Risk ? Mild aspiration risk;Risk for inadequate nutrition/hydration (reduced following general aspiration precautions; feeding support to monitor; Supervision w/ po's)  ?  ?Diet Recommendation   Dysphagia level 1 (PUREE) w/ Thin liquids in setting of Baseline dysphagia(oral phase) per previous assessments and risk for aspiration w/ impact from intellectual/cognitive status. Recommend general aspiration precautions; monitoring for oral clearing b/t boluses; feeding assistance and supervision w/ all oral intake.  ? ?Medication Administration: Crushed with puree  ?  ?Other  Recommendations Recommended Consults:  (Dietician f/u) ?Oral Care Recommendations: Oral care BID;Oral care before and after PO;Staff/trained caregiver to provide oral care ?Other Recommendations:  (n/a)   ? ?Recommendations for follow up therapy are one component of a multi-disciplinary discharge planning process, led by the attending physician.  Recommendations may be updated based on patient status, additional functional criteria and insurance authorization. ? ?Follow up Recommendations Follow physician's recommendations for discharge plan and follow up  therapies  ? ? ?  ?Assistance Recommended at Discharge Frequent or constant Supervision/Assistance  ?Functional Status Assessment Patient has had a recent decline in their functional status and demonstrates the ability to make significant improvements in function in a reasonable and predictable amount of time.  ?Frequency and Duration  (n/a)  ? (n/a) ?  ?   ? ?Prognosis Prognosis for Safe Diet Advancement: Fair ?Barriers to Reach Goals: Cognitive deficits;Language deficits;Time post onset;Severity of deficits;Behavior ?Barriers/Prognosis Comment: baseline declined intellectual/cognitive status  ? ?  ? ?Swallow Study   ?General Date of Onset: 07/13/21 ?HPI: Pt is a 76 y.o. female  with a PMHx of epilepsy, COPD, Obesity, and non-verbal at baseline who presents for an episode of seizure activity reportedly lasting 1 hour and resolving after administration of ativan in her long term care facility. Pt non-verbal at baseline so history obtained from nursing staff and EMS who denied patient missing any doses of her AEDs. Denied pt having decreased PO intake, sleep, or activity.  Per report from PT, pt is bed bound at baseline, total care for ADLs, living in facility.  Head CT negative.  Lung sounds are diminished due to body habitus, respiratory effort appears normal per MD note.   Pt has been drowsy and unable to feed self; NSG reported concern re: being able to manage an oral diet of Regular consistency/solid foods. ?Type of Study: Bedside Swallow Evaluation ?Previous Swallow Assessment: 2021; 2022 ?Diet Prior to this Study: Dysphagia 1 (puree);Thin liquids (previous admits; unsure of diet given to her at her facility) ?Temperature Spikes Noted: No (WBC wnl) ?Respiratory Status: Room air ?History of Recent Intubation: No ?Behavior/Cognition: Alert;Cooperative;Pleasant mood;Confused;Distractible;Requires cueing;Doesn't follow directions (phonations; nonverbal) ?Oral Cavity Assessment: Excessive secretions (min;  macrocheilia(protruding lower lip,  tongue) baseline) ?Oral Care Completed by SLP: Yes ?Oral Cavity - Dentition: Edentulous ?Vision:  (n/a) ?Self-Feeding Abilities: Total assist (did not attempt) ?Patient Positioning: U

## 2021-07-17 NOTE — TOC Progression Note (Signed)
Transition of Care (TOC) - Progression Note  ? ? ?Patient Details  ?Name: Mariah Jimenez ?MRN: [id number] ?Date of Birth: 10/09/1945 ? ?Transition of Care (TOC) CM/SW Contact  ?Caryn Section, RN ?Phone Number: ?07/17/2021, 1:08 PM ? ?Clinical Narrative:   [facility] Message left for patient's sister, [name] re: transfer. ? ? ? ?Expected Discharge Plan: Skilled Nursing Facility ?Barriers to Discharge: Continued Medical Work up ? ?Expected Discharge Plan and Services ?Expected Discharge Plan: Skilled Nursing Facility ?  ?Discharge Planning Services: CM Consult ?Post Acute Care Choice: Resumption of Svcs/PTA Provider ?Living arrangements for the past 2 months: Skilled Nursing Facility ?Expected Discharge Date: 07/17/21               ?DME Arranged: N/A ?DME Agency: NA ?  ?  ?  ?HH Arranged: NA ?HH Agency: NA ?  ?  ?  ? ? ?Social Determinants of Health (SDOH) Interventions ?  ? ?Readmission Risk Interventions ?   ? View : No data to display.  ?  ?  ?  ? ? ?

## 2021-07-17 NOTE — Discharge Summary (Signed)
?Physician Discharge Summary ?  ?Patient: Mariah Jimenez MRN: [id number] DOB: 01-Aug-1945  ?Admit date:     07/14/2021  ?Discharge date: 07/17/21  ?Discharge Physician: Edwin Dada  ? ?PCP: Alvester Morin, MD  ? ?Recommendations at discharge:  ?Given Aerococcus UTI, would recommend follow up with GYN within 1 week ?Follow up with Neurologist in 1-2 months ? ? ? ? ? ?Discharge Diagnoses: ?Principal Problem: ?  Seizure ([place]) ?Active Problems: ?  UTI (urinary tract infection) ?  Intellectual disability ?  Class 3 obesity (HCC) ?  Acute metabolic encephalopathy ? ? ? ? ? ?Hospital Course: ?Mariah Jimenez is a 76 y.o. F with hx intellectual developmental delay, non-verbal at baseline, lives in [place], recently biopsied vulvar lesion, epilepsy, reactive airways disease, and obesity who presented with seizure. ? ?Had seizure 2 days prior to this admission, aborted with BZD, evaluated in ER, appeared clinically at baseline, discharged back  to facility.   ? ?There, had another seizure on day of admission, returned to the ER ? ?In the ER, CTH was unremarkable, UA with new bacteria/pyuria.  Started on antibiotics, Neurology consulted. ? ? ? ? ? ?* Seizure ([place]) ?UTI ?Initially loaded with IV Keppra and Dilantin.  Metabolic work up notable for Aerococcus urine culture and pyuria only.  Treated with Rocephin and fluids.   ? ?Evaluated by Neurology who felt the seizure was likely provoked by UTI, recommended continuation of home regimen.  No further seizures here.  Mentation returned to baseline.   ? ?Follow up with home Neurologist in 1-2 months. ? ? ? ? ?UTI (urinary tract infection) ?Urine culture with Aerococcus.  Treat for 4 more days with cefdinir. ? ? ? ?Acute metabolic encephalopathy ?Seems to be resolved to baseline.  B12, TSH, Ammonia normal.   ? ?Class 3 obesity ([place]) ? ?Intellectual disability ?  ? ? ? ? ?  ? ?Pain control - [employer] Controlled Substance Reporting System database was reviewed.   ? ? ? ? ?Consultants: Neurology ?  ? ? ? ? ?DISCHARGE MEDICATION: ?Allergies as of 07/17/2021   ?No Known Allergies ?  ? ?  ?Medication List  ?  ? ?TAKE these medications   ? ?cefdinir 300 MG capsule ?Commonly known as: OMNICEF ?Take 1 capsule (300 mg total) by mouth 2 (two) times daily. ?  ?diazepam 5 MG tablet ?Commonly known as: VALIUM ?Take 2.5 mg by mouth 2 (two) times daily. ?  ?ipratropium-albuterol 0.5-2.5 (3) MG/3ML Soln ?Commonly known as: DUONEB ?Take 3 mLs by nebulization every 4 (four) hours as needed. PRN wheezing, SOB ?  ?lamoTRIgine 25 MG tablet ?Commonly known as: LAMICTAL ?Take 50 mg by mouth daily. Take (2) tablets once daily. ?  ?lamoTRIgine 150 MG tablet ?Commonly known as: LAMICTAL ?Take 75 mg by mouth daily. Take 1/2 tablet (75mg ) at bedtime ?  ?levETIRAcetam 750 MG tablet ?Commonly known as: KEPPRA ?Take 750 mg by mouth 2 (two) times daily. ?  ?loratadine 10 MG tablet ?Commonly known as: CLARITIN ?Take 10 mg by mouth daily. ?  ?LORazepam 2 MG/ML injection ?Commonly known as: ATIVAN ?Inject 1 mg into the muscle as needed. Repeat in 30 minutes as needed for seizure activity ?  ?LORazepam 1 MG tablet ?Commonly known as: ATIVAN ?Take 1 mg by mouth daily as needed. Special Instructions: Give 1 mg crushed and placed under her tongue for seize activity, may repeat in 30 minutes if seizures continue. ?  ?multivitamin with minerals Tabs tablet ?Take 1 tablet by mouth daily. ?  ?  pantoprazole 40 MG tablet ?Commonly known as: PROTONIX ?Take 1 tablet (40 mg total) by mouth daily. ?  ?PHENobarbital 32.4 MG tablet ?Commonly known as: LUMINAL ?Take 1 tablet (32.4 mg total) by mouth 3 (three) times daily. ?  ?phenytoin 300 MG ER capsule ?Commonly known as: DILANTIN ?Take 300 mg by mouth at bedtime. ?  ?SPS 15 GM/60ML suspension ?Generic drug: sodium polystyrene ?Take 60 mLs by mouth every Wednesday. ?  ?Vimpat 50 MG Tabs tablet ?Generic drug: lacosamide ?Take 50 mg by mouth 2 (two) times daily. ?  ? ?   ? ? ?Discharge Exam: ?Danley Danker Weights  ? 07/14/21 1448  ?Weight: 112.9 kg  ? ?General: Pt is awake, not in acute distress but nonverbal at baseline ?Cardiovascular: RRR, nl S1-S2, no murmurs appreciated.   No LE edema.   ?Respiratory: Normal respiratory rate and rhythm.  CTAB without rales or wheezes. ?Abdominal: Abdomen soft and non-tender.  No distension or HSM.   ?Neuro/Psych: Bed bound at baseline, nonverbal and does not follow commands.  Makes some spontaneous but unintelligible verbalizations.  Participates weakly in cares.    ? ? ?Condition at discharge: stable ? ?The results of significant diagnostics from this hospitalization (including imaging, microbiology, ancillary and laboratory) are listed below for reference.  ? ?Imaging Studies: ?CT HEAD WO CONTRAST (5MM) ? ?Result Date: 07/14/2021 ?CLINICAL DATA:  Seizure, new-onset, no history of trauma EXAM: CT HEAD WITHOUT CONTRAST TECHNIQUE: Contiguous axial images were obtained from the base of the skull through the vertex without intravenous contrast. RADIATION DOSE REDUCTION: This exam was performed according to the departmental dose-optimization program which includes automated exposure control, adjustment of the mA and/or kV according to patient size and/or use of iterative reconstruction technique. COMPARISON:  CT head 11/12/2020 BRAIN: BRAIN Cerebral ventricle sizes are concordant with the degree of cerebral volume loss. No evidence of large-territorial acute infarction. No parenchymal hemorrhage. No mass lesion. No extra-axial collection. No mass effect or midline shift. No hydrocephalus. Basilar cisterns are patent. Vascular: No hyperdense vessel. Atherosclerotic calcifications are present within the cavernous internal carotid arteries. Skull: No acute fracture or focal lesion. Sinuses/Orbits: Persistent complete left mastoid effusion and interval development of complete right mastoid effusion. Query right middle ear free fluid. Mucosal thickening the  left frontal, left sphenoid, bilateral ethmoid sinuses. The orbits are unremarkable. Other: None. IMPRESSION: 1. No acute intracranial abnormality. 2. Persistent complete left mastoid effusion and interval development of complete right mastoid effusion. Query right middle ear free fluid. Electronically Signed   By: Iven Finn M.D.   On: 07/14/2021 16:01   ? ?Microbiology: ?Results for orders placed or performed during the hospital encounter of 07/14/21  ?Urine Culture     Status: Abnormal  ? Collection Time: 07/15/21  9:20 PM  ? Specimen: Urine, Random  ?Result Value Ref Range Status  ? Specimen Description   Final  ?  URINE, RANDOM ?Performed at [facility], [address] [place], [place] [address] ?  ? Special Requests   Final  ?  NONE ?Performed at [facility], [address] [place], [place] [address] ?  ? Culture (A)  Final  ?  >=100,000 COLONIES/mL AEROCOCCUS SPECIES ?Standardized susceptibility testing for this organism is not available. ?Performed at [facility], [place] [address], [place], [place] [address] ?  ? Report Status 07/17/2021 FINAL  Final  ?MRSA Next Gen by PCR, Nasal     Status: None  ? Collection Time: 07/16/21  9:29 PM  ?  Specimen: Nasal Mucosa; Nasal Swab  ?Result Value Ref Range Status  ? MRSA by PCR Next Gen NOT DETECTED NOT DETECTED Final  ?  Comment: (NOTE) ?The GeneXpert MRSA Assay (FDA approved for NASAL specimens only), ?is one component of a comprehensive MRSA colonization surveillance ?program. It is not intended to diagnose MRSA infection nor to guide ?or monitor treatment for MRSA infections. ?Test performance is not FDA approved in patients less than 2 years ?old. ?Performed at [facility], [place], ?[place] [address] ?  ? ? ?Labs: ?CBC: ?Recent Labs  ?Lab 07/13/21 ?1421 07/14/21 ?1528 07/15/21 ?0630  ?WBC 4.9 6.3 5.5  ?NEUTROABS 2.8 3.8  --   ?HGB 10.9* 10.6* 10.4*  ?HCT 35.3* 34.0* 32.5*  ?MCV 89.1 89.9 86.7  ?PLT 318  284 265  ? ?Basic Metabolic Panel: ?Recent Labs  ?Lab 07/13/21 ?1427 07/14/21 ?1528 07/15/21 ?0630  ?NA 140 141 142  ?K 4.1 4.6 5.0  ?CL 106 104 111  ?CO2 25 28 22   ?GLUCOSE 142* 142* 86  ?BUN 19 19 16   ?CREATININE

## 2021-07-17 NOTE — Care Management Important Message (Signed)
Important Message ? ?Patient Details  ?Name: Mariah Jimenez ?MRN: [id number] ?Date of Birth: 02/19/1946 ? ? ?Medicare Important Message Given:  N/A - LOS <3 / Initial given by admissions ? ? ? ? ?[name] ?07/17/2021, 7:25 AM ?

## 2021-07-19 ENCOUNTER — Other Ambulatory Visit: Payer: Self-pay

## 2021-07-19 ENCOUNTER — Emergency Department: Payer: Medicare Other

## 2021-07-19 ENCOUNTER — Inpatient Hospital Stay
Admission: EM | Admit: 2021-07-19 | Discharge: 2021-07-28 | DRG: 100 | Disposition: A | Discharge status: Hospice | Payer: Medicare Other | Attending: Internal Medicine | Admitting: Internal Medicine

## 2021-07-19 ENCOUNTER — Encounter: Payer: Self-pay | Admitting: Emergency Medicine

## 2021-07-19 DIAGNOSIS — R946 Abnormal results of thyroid function studies: Secondary | ICD-10-CM | POA: Diagnosis present

## 2021-07-19 DIAGNOSIS — Z515 Encounter for palliative care: Secondary | ICD-10-CM

## 2021-07-19 DIAGNOSIS — R579 Shock, unspecified: Secondary | ICD-10-CM | POA: Diagnosis present

## 2021-07-19 DIAGNOSIS — R4182 Altered mental status, unspecified: Secondary | ICD-10-CM | POA: Diagnosis not present

## 2021-07-19 DIAGNOSIS — H7093 Unspecified mastoiditis, bilateral: Secondary | ICD-10-CM | POA: Diagnosis present

## 2021-07-19 DIAGNOSIS — Z818 Family history of other mental and behavioral disorders: Secondary | ICD-10-CM | POA: Diagnosis not present

## 2021-07-19 DIAGNOSIS — R0902 Hypoxemia: Secondary | ICD-10-CM | POA: Diagnosis not present

## 2021-07-19 DIAGNOSIS — Z7189 Other specified counseling: Secondary | ICD-10-CM | POA: Diagnosis not present

## 2021-07-19 DIAGNOSIS — E785 Hyperlipidemia, unspecified: Secondary | ICD-10-CM | POA: Diagnosis present

## 2021-07-19 DIAGNOSIS — G9389 Other specified disorders of brain: Secondary | ICD-10-CM | POA: Diagnosis present

## 2021-07-19 DIAGNOSIS — Q283 Other malformations of cerebral vessels: Secondary | ICD-10-CM | POA: Diagnosis not present

## 2021-07-19 DIAGNOSIS — Z20822 Contact with and (suspected) exposure to covid-19: Secondary | ICD-10-CM | POA: Diagnosis present

## 2021-07-19 DIAGNOSIS — R0689 Other abnormalities of breathing: Secondary | ICD-10-CM | POA: Diagnosis not present

## 2021-07-19 DIAGNOSIS — G9341 Metabolic encephalopathy: Secondary | ICD-10-CM | POA: Diagnosis not present

## 2021-07-19 DIAGNOSIS — Z66 Do not resuscitate: Secondary | ICD-10-CM | POA: Diagnosis present

## 2021-07-19 DIAGNOSIS — E669 Obesity, unspecified: Secondary | ICD-10-CM | POA: Diagnosis not present

## 2021-07-19 DIAGNOSIS — Z8673 Personal history of transient ischemic attack (TIA), and cerebral infarction without residual deficits: Secondary | ICD-10-CM

## 2021-07-19 DIAGNOSIS — R9431 Abnormal electrocardiogram [ECG] [EKG]: Secondary | ICD-10-CM | POA: Diagnosis present

## 2021-07-19 DIAGNOSIS — I959 Hypotension, unspecified: Secondary | ICD-10-CM | POA: Diagnosis not present

## 2021-07-19 DIAGNOSIS — R569 Unspecified convulsions: Secondary | ICD-10-CM | POA: Diagnosis not present

## 2021-07-19 DIAGNOSIS — I10 Essential (primary) hypertension: Secondary | ICD-10-CM | POA: Diagnosis not present

## 2021-07-19 DIAGNOSIS — G40409 Other generalized epilepsy and epileptic syndromes, not intractable, without status epilepticus: Secondary | ICD-10-CM | POA: Diagnosis present

## 2021-07-19 DIAGNOSIS — R630 Anorexia: Secondary | ICD-10-CM | POA: Diagnosis present

## 2021-07-19 DIAGNOSIS — R001 Bradycardia, unspecified: Secondary | ICD-10-CM | POA: Diagnosis not present

## 2021-07-19 DIAGNOSIS — F88 Other disorders of psychological development: Secondary | ICD-10-CM | POA: Diagnosis present

## 2021-07-19 DIAGNOSIS — F0390 Unspecified dementia without behavioral disturbance: Secondary | ICD-10-CM | POA: Diagnosis not present

## 2021-07-19 DIAGNOSIS — R404 Transient alteration of awareness: Secondary | ICD-10-CM | POA: Diagnosis not present

## 2021-07-19 DIAGNOSIS — Z6837 Body mass index (BMI) 37.0-37.9, adult: Secondary | ICD-10-CM

## 2021-07-19 DIAGNOSIS — Z7401 Bed confinement status: Secondary | ICD-10-CM

## 2021-07-19 DIAGNOSIS — N390 Urinary tract infection, site not specified: Secondary | ICD-10-CM | POA: Diagnosis not present

## 2021-07-19 DIAGNOSIS — G40909 Epilepsy, unspecified, not intractable, without status epilepticus: Secondary | ICD-10-CM | POA: Diagnosis not present

## 2021-07-19 DIAGNOSIS — F79 Unspecified intellectual disabilities: Secondary | ICD-10-CM | POA: Diagnosis present

## 2021-07-19 DIAGNOSIS — R748 Abnormal levels of other serum enzymes: Secondary | ICD-10-CM | POA: Diagnosis present

## 2021-07-19 DIAGNOSIS — G319 Degenerative disease of nervous system, unspecified: Secondary | ICD-10-CM | POA: Diagnosis not present

## 2021-07-19 DIAGNOSIS — R4781 Slurred speech: Secondary | ICD-10-CM | POA: Diagnosis present

## 2021-07-19 DIAGNOSIS — Z79899 Other long term (current) drug therapy: Secondary | ICD-10-CM

## 2021-07-19 DIAGNOSIS — G9340 Encephalopathy, unspecified: Secondary | ICD-10-CM | POA: Diagnosis not present

## 2021-07-19 DIAGNOSIS — R609 Edema, unspecified: Secondary | ICD-10-CM | POA: Diagnosis not present

## 2021-07-19 DIAGNOSIS — H7090 Unspecified mastoiditis, unspecified ear: Secondary | ICD-10-CM | POA: Diagnosis not present

## 2021-07-19 DIAGNOSIS — R401 Stupor: Secondary | ICD-10-CM | POA: Diagnosis not present

## 2021-07-19 LAB — URINALYSIS, COMPLETE (UACMP) WITH MICROSCOPIC
Bilirubin Urine: NEGATIVE
Glucose, UA: NEGATIVE mg/dL
Ketones, ur: 5 mg/dL — AB
Nitrite: NEGATIVE
Protein, ur: 100 mg/dL — AB
RBC / HPF: >50 RBC/hpf — ABNORMAL HIGH (ref 0–5)
Specific Gravity, Urine: 1.018 (ref 1.005–1.030)
WBC, UA: >50 WBC/hpf — ABNORMAL HIGH (ref 0–5)
pH: 6 (ref 5.0–8.0)

## 2021-07-19 LAB — COMPREHENSIVE METABOLIC PANEL
ALT: 22 U/L (ref 0–44)
AST: 31 U/L (ref 15–41)
Albumin: 3.1 g/dL — ABNORMAL LOW (ref 3.5–5.0)
Alkaline Phosphatase: 217 U/L — ABNORMAL HIGH (ref 38–126)
Anion gap: 7 (ref 5–15)
BUN: 17 mg/dL (ref 8–23)
CO2: 27 mmol/L (ref 22–32)
Calcium: 9.2 mg/dL (ref 8.9–10.3)
Chloride: 109 mmol/L (ref 98–111)
Creatinine, Ser: 0.74 mg/dL (ref 0.44–1.00)
GFR, Estimated: >60 mL/min (ref >60)
Glucose, Bld: 93 mg/dL (ref 70–99)
Potassium: 5.1 mmol/L (ref 3.5–5.1)
Sodium: 143 mmol/L (ref 135–145)
Total Bilirubin: 0.6 mg/dL (ref 0.3–1.2)
Total Protein: 8 g/dL (ref 6.5–8.1)

## 2021-07-19 LAB — CBC WITH DIFFERENTIAL/PLATELET
Abs Immature Granulocytes: 0.01 10*3/uL (ref 0.00–0.07)
Basophils Absolute: 0 10*3/uL (ref 0.0–0.1)
Basophils Relative: 1 %
Eosinophils Absolute: 0.4 10*3/uL (ref 0.0–0.5)
Eosinophils Relative: 11 %
HCT: 35.9 % — ABNORMAL LOW (ref 36.0–46.0)
Hemoglobin: 11.3 g/dL — ABNORMAL LOW (ref 12.0–15.0)
Immature Granulocytes: 0 %
Lymphocytes Relative: 52 %
Lymphs Abs: 2.1 10*3/uL (ref 0.7–4.0)
MCH: 28.3 pg (ref 26.0–34.0)
MCHC: 31.5 g/dL (ref 30.0–36.0)
MCV: 90 fL (ref 80.0–100.0)
Monocytes Absolute: 0.1 10*3/uL (ref 0.1–1.0)
Monocytes Relative: 3 %
Neutro Abs: 1.3 10*3/uL — ABNORMAL LOW (ref 1.7–7.7)
Neutrophils Relative %: 33 %
Platelets: 296 10*3/uL (ref 150–400)
RBC: 3.99 MIL/uL (ref 3.87–5.11)
RDW: 17.7 % — ABNORMAL HIGH (ref 11.5–15.5)
WBC: 4 10*3/uL (ref 4.0–10.5)
nRBC: 0 % (ref 0.0–0.2)

## 2021-07-19 LAB — LACTIC ACID, PLASMA: Lactic Acid, Venous: 0.8 mmol/L (ref 0.5–1.9)

## 2021-07-19 MED ORDER — HYDROCORTISONE SOD SUC (PF) 100 MG IJ SOLR
100.0000 mg | Freq: Once | INTRAMUSCULAR | Status: AC
  Administered 2021-07-20: 100 mg via INTRAVENOUS
  Filled 2021-07-19: qty 2

## 2021-07-19 MED ORDER — LACTATED RINGERS IV BOLUS
500.0000 mL | Freq: Once | INTRAVENOUS | Status: AC
Start: 2021-07-19 — End: 2021-07-19
  Administered 2021-07-19: 500 mL via INTRAVENOUS

## 2021-07-19 MED ORDER — SODIUM CHLORIDE 0.9 % IV BOLUS
500.0000 mL | Freq: Once | INTRAVENOUS | Status: AC
Start: 2021-07-19 — End: 2021-07-19
  Administered 2021-07-19: 500 mL via INTRAVENOUS

## 2021-07-19 MED ORDER — CEFTRIAXONE SODIUM 1 G IJ SOLR
1.0000 g | Freq: Once | INTRAMUSCULAR | Status: DC
Start: 2021-07-19 — End: 2021-07-19

## 2021-07-19 MED ORDER — CEFTRIAXONE SODIUM 1 G IJ SOLR
1.0000 g | Freq: Once | INTRAMUSCULAR | Status: AC
  Administered 2021-07-19: 1 g via INTRAVENOUS
  Filled 2021-07-19: qty 10

## 2021-07-19 MED ORDER — LACTATED RINGERS IV BOLUS
1000.0000 mL | Freq: Once | INTRAVENOUS | Status: AC
Start: 2021-07-19 — End: 2021-07-19
  Administered 2021-07-19: 1000 mL via INTRAVENOUS

## 2021-07-19 MED ORDER — SODIUM CHLORIDE 0.9 % IV SOLN: 2.0000 g | INTRAVENOUS | Status: DC

## 2021-07-19 MED ORDER — NOREPINEPHRINE 4 MG/250ML-% IV SOLN
2.0000 ug/min | INTRAVENOUS | Status: DC
  Administered 2021-07-19 (×2): 2 ug/min via INTRAVENOUS
  Administered 2021-07-20: 10 ug/min via INTRAVENOUS
  Administered 2021-07-20: 4 ug/min via INTRAVENOUS
  Filled 2021-07-19 (×2): qty 250

## 2021-07-19 MED ORDER — SODIUM CHLORIDE 0.9 % IV SOLN
INTRAVENOUS | Status: DC | PRN
Start: 2021-07-19 — End: 2021-07-23

## 2021-07-19 MED ORDER — LACTATED RINGERS IV SOLN: INTRAVENOUS | Status: DC

## 2021-07-19 NOTE — ED Notes (Signed)
Multiple attempts of IV access but IV team RN and Dr. Derrill Kay, obtained IV in the [facility] and this RN was able to obtain lt green, purple, gray, and blue top. Unable to obtain blood cultures. Dr. Derrill Kay made aware at this time.  ?

## 2021-07-19 NOTE — H&P (Addendum)
? ?NAME:  Mariah Jimenez, MRN:  [id number], DOB:  [birth year], LOS: 0 ?ADMISSION DATE:  07/19/2021, CONSULTATION DATE:   ?REFERRING MD:  Dr. Carter Kitten, CHIEF COMPLAINT:  AMS  ? ?History of Present Illness:  ?76 yo F presenting to [facility] ED from [facility] via EMS on 07/19/21 with complaints of AMS. Patient's niece is bedside providing patient's history. Per niece, the patient is able to talk at baseline but at times is difficult to understand. She is able to sit up and feed herself as well. She is bed bound but has been assisted with equipment out of bed recently. Per her niece family noticed a change around 3/31-4/2 when the patient became extremely lethargic and non interactive. She was then hospitalized on 07/14/21-07/17/21 with seizure activity at her SNF and subsequently discovered UTI, this resolved with admission and Cefdinir completed on Sat 4/8. ? ?After her recent discharge, patient remained more lethargic then baseline and it is unclear how reliably she has been getting her PO medications, including seizure medications. Per SNF staff they have been crushing them in applesauce and placing them under her tongue. Per the niece, facility staff has alluded to "trying multiple times to give meds". Visible white residue noted on the patient's bottom lip- possibly from recent medication attempts. EMS was called due to family's concerns of extreme lethargy. No complaints of recent seizure activity after recent discharge. There was previous documentation that the patient's tongue/lip might be swollen-clarified with niece, [name], that this is not the case and her tongue/bottom lip are at baseline. While in the room patient began to have what appeared to be a partial localized seizure activity involving rhythmic tremors in her face. Niece, [name], confirmed that this is how her aunt's seizures present, ending with a state appearing like deep sleep with snoring. Ativan 2 mg x 2 given with cessation of seizure like  activity. Bolus of Keppra IV ordered to catch patient up on missed AED.  ? ?Of note TSH on recent admission elevated at 8.006, no T4 drawn at that time. Patient does not have a history of thyroid disease. 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 STAT Thyroid panel and cortisol level ordered, one dose of solu-cortef ordered as well. ? ?ED course: ?AMS, non verbal, challenging to get access. Concern for sepsis but patient has no leukocytosis or lactic acidosis. Pt. Became hypotensive, IVF boluses given with improvement in BP. Eventually peripheral vasopressors initiated.  ?Medications given: Rocephin, 2 L IVF bolus ?Initial Vitals: 97.5, 16, HR 55, 131/46, 100% on RA ?Significant labs: (Labs/ Imaging personally reviewed) ?I, Domingo Pulse Rust-Chester, AGACNP-BC, personally viewed and interpreted this ECG. ?EKG Interpretation: Date: 07/19/21, EKG Time: 15:41, Rate: 55, Rhythm: SB with 1st degree HB, QRS Axis:  normal, Intervals: prolonged PR, ST/T Wave abnormalities: none, Narrative Interpretation: SB with 1st degree HB ?Chemistry: Na+: 143, K+:  5.1, BUN/Cr.: 17/0.74, Serum CO2/ AG: 27/ 7, alk phos: 217 ?Hematology: WBC: 4.0, Hgb: 11.3,  ?Lactic/ PCT: 0.8/ pending, VBG: pending ?CXR 07/19/21: possible atelectasis in bilateral lung bases, otherwise no acute cardiopulmonary process. ?CT head wo contrast: no acute intracranial abnormality. 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 Bilateral mastoid effusions again noted. (Did not note previous reading of lacunar infarct, discussed with Radiologist who looked back on previous [place] from 4/4 and 11/2020 noting a similar presentation in the lacunar area- so not a new finding) ? ?PCCM consulted for admission  due to hypotension requiring peripheral vasopressor support. ? ?Pertinent  Medical History  ?Intellectual Disability ?Seizure ?HTN ?HLD ? ?Significant Hospital Events: ?Including  procedures, antibiotic start and stop dates in addition to other pertinent events   ?07/20/21: Admit to ICU with hypotension s/t suspected hypovolemia vs myxedema coma on peripheral vasopressor support. ? ?Interim History / Subjective:  ?Patient alert to voice, with eye contact, non verbal and unable to follow commands. Acute change from baseline- see HPI. ? ?Objective   ?Blood pressure (!) 97/54, pulse (!) 52, temperature (!) 97.5 ?F (36.4 ?C), temperature source Axillary, resp. rate 12, SpO2 100 %. ?   ?   ? ?Intake/Output Summary (Last 24 hours) at 07/19/2021 2308 ?Last data filed at 07/19/2021 2214 ?Gross per 24 hour  ?Intake 1100 ml  ?Output --  ?Net 1100 ml  ? ?There were no vitals filed for this visit. ? ?Examination: ?General: Adult female, acutely ill, lying in bed, NAD ?HEENT: examined ear canal bilaterally- possible fluid behind LEFT ear drum, however challenging assessment due to increased wax buildup. Externally no redness/swelling noted, MM pink/dry, anicteric, atraumatic, neck supple ?Neuro: non verbal, unable to follow commands, eye opening and tracking to voice, PERRL +3, MAE- generalized weakness ?CV: s1s2 RRR, SB with 1st degree HB on monitor, no r/m/g ?Pulm: Regular, non labored on RA, breath sounds clear/diminished throughout ?GI: soft, rounded, non tender, bs x 4 ?GU: pure wick in place with clear yellow urine ?Skin: no rashes/lesions noted ?Extremities: cool/dry, pulses + 2 R/P, no edema noted ? ?Resolved Hospital Problem list   ? ? ?Assessment & Plan:  ?Hypotension secondary to suspected dehydration in the setting of poor PO intake and acute encephalopathy ?Patient received 2 L IVF with improvement in BP, no leukocytosis or lactic acidosis to suggest septic shock ?- continue LR infusion ?- peripheral levophed PRN to maintain MAP > 65 ? ?Suspected Breakthrough Seizure Activity secondary to suspected medication compliance issue in the setting of acute encephalopathy ?Query subclinical seizure  activity? ?Acute Encephalopathy unclear etiology ?PMHx: seizure disorder, intellectual disability ?CTH negative for acute abnormality. Patient's seizure like activity resolved with 2 mg Ativan x 2 ?- 1 g of Keppra ordered x 1, followed by outpatient dosing 750 mg BID converted to IV ?- Vimpat & Phenobarbital converted to IV dosing ?- Dilantin dose converted to 100 mg IV Q 8 from ER PO dosage ?- unable to order Lamictal IV, discussed with niece bedside. Will reassess patient's mental status in AM, if still unable to take PO medications will discuss possible NGT placement. Niece stated she will talk to her mom (the patient's sister, Bertram Millard) and discuss if that is an intervention they are OK with. ?- EEG ordered for AM to rule out subclinical seizure activity ?- consider MRI if mental status does not improve ?- Ativan IV PRN for seizure activity ?- neurology consult, appreciate input ? ?Query Myxedema Coma? In the setting of bradycardia, hypotension, lethargy, hypothermia ?Recent TSH elevated acutely 8.006 ?- STAT thyroid panel, cortisol ?- solu-cortef x 1 ordered, consider additional dosing and synthroid PRN depending on lab results ?- apply bear hugger PRN ? ?Bilateral Mastoid Effusions ?Recent UTI completed antibiotic course of Cefdinir ?UA: +lrg leuks, pyuria, > 50 WBC ?- rocephin IV 2 g, 5 day course ?- f/u & trend PCT, daily CBC, monitor WBC & fever curve ?- consider ENT consult  ? ?Elevated Alk Phos ?- appears to be baseline in the 200's, previous CT abd/pelvis & [place] have ruled out acute issue. AST,  ALT normal range ?Best Practice (right click and "Reselect all SmartList Selections" daily)  ?Diet/type: NPO ?DVT prophylaxis: LMWH ?GI prophylaxis: PPI ?Lines: N/A ?Foley:  N/A ?Code Status:  DNR ?Last date of multidisciplinary goals of care discussion [07/20/21] ? ?Confirmed DNR/DNI with niece, [name], bedside. EDP confirmed with sister [name] prior to admission. Palliative care follows patient at SNF, will consult to  assist in ongoing treatment plan discussions. ? ?Labs   ?CBC: ?Recent Labs  ?Lab 07/13/21 ?1421 07/14/21 ?1528 07/15/21 ?0630 07/17/21 ?1309 07/19/21 ?1738  ?WBC 4.9 6.3 5.5 4.8 4.0  ?NEUTROABS 2.8 3.8  --   --  1

## 2021-07-19 NOTE — ED Triage Notes (Signed)
Pt via EMS from [facility]. Per facility, pt is here for altered mental status. Per family, pt has been hard to arouse, states that she has not had her antibiotics since Saturday morning, pt is currently being treated for a UTI. Unknown pt baseline. Pt has spontaneous eye opening but non-verbal. Pt was recently in the hospital for seizures. Pt is alert, but not speaking, per previous notes pt is non-verbal at baseline.  ?

## 2021-07-19 NOTE — ED Notes (Signed)
Sam, RN and this RN attempted PIV access. Bill, RN attempted US guided IV access with no success at this time. IV team consult placed at this time. Dr. Derrill Kay made aware.  ?

## 2021-07-19 NOTE — ED Provider Notes (Signed)
? ?[facility] ?Provider Note ? ? ? Event Date/Time  ? First MD Initiated Contact with Patient 07/19/21 1534   ?  (approximate) ? ? ?History  ? ?Altered Mental Status ? ? ?HPI ? ?Mariah Jimenez is a 76 y.o. female  who, per discharge summary dated two days ago had admission for seizure and UTI, who presents to the emergency department today because of concern for altered mental status. The patient is non verbal so cannot give any history. Apparently she was found to be harder to arouse today. The patient had recent admission to the hospital and apparently has not had her past two doses of antibiotics. No report of any seizure like activity today.  ? ?Physical Exam  ? ?Triage Vital Signs: ?ED Triage Vitals [07/19/21 1545]  ?Enc Vitals Group  ?   BP (!) 131/46  ?   Pulse Rate (!) 55  ?   Resp 16  ?   Temp   ?   Temp src   ? ?Most recent vital signs: ?Vitals:  ? 07/19/21 1545  ?BP: (!) 131/46  ?Pulse: (!) 55  ?Resp: 16  ?SpO2: 100%  ? ? ?General: Awake, alert. ?CV:  Good peripheral perfusion. Bradycardia. Regular rhythm. ?Resp:  Normal effort. Lungs clear to auscultation anteriorly, exam limited by body habitus.  ?Abd:  No distention. Non tender.  ? ? ?ED Results / Procedures / Treatments  ? ?Labs ?(all labs ordered are listed, but only abnormal results are displayed) ?Labs Reviewed  ?COMPREHENSIVE METABOLIC PANEL - Abnormal; Notable for the following components:  ?    Result Value  ? Albumin 3.1 (*)   ? Alkaline Phosphatase 217 (*)   ? All other components within normal limits  ?CBC WITH DIFFERENTIAL/PLATELET - Abnormal; Notable for the following components:  ? Hemoglobin 11.3 (*)   ? HCT 35.9 (*)   ? RDW 17.7 (*)   ? Neutro Abs 1.3 (*)   ? All other components within normal limits  ?URINALYSIS, COMPLETE (UACMP) WITH MICROSCOPIC - Abnormal; Notable for the following components:  ? Color, Urine AMBER (*)   ? APPearance CLOUDY (*)   ? Hgb urine dipstick SMALL (*)   ? Ketones, ur 5 (*)   ? Protein,  ur 100 (*)   ? Leukocytes,Ua LARGE (*)   ? RBC / HPF >50 (*)   ? WBC, UA >50 (*)   ? Bacteria, UA MANY (*)   ? All other components within normal limits  ?CULTURE, BLOOD (ROUTINE X 2)  ?CULTURE, BLOOD (ROUTINE X 2)  ?URINE CULTURE  ?LACTIC ACID, PLASMA  ? ? ? ?EKG ? ?INance Pear, attending physician, personally viewed and interpreted this EKG ? ?EKG Time: 1541 ?Rate: 55 ?Rhythm: sinus bradycardia ?Axis: normal ?Intervals: qtc 462 ?QRS: narrow ?ST changes: no st elevation ?Impression: abnormal ekg ? ? ? ?RADIOLOGY ?I independently interpreted and visualized the CT head. My interpretation: no bleed. No large mass. ?Radiology interpretation:  ?IMPRESSION:  ?1. No acute intracranial abnormality.  ?2. Chronic lacunar infarct in the left basal ganglia.  ?3. Bilateral mastoid effusions again noted.  ?   ? ? ?I independently interpreted and visualized the CXR. My interpretation: No pneumonia. ?Radiology interpretation:  ?IMPRESSION:  ?No acute cardiopulmonary disease.  ? ? ? ? ?PROCEDURES: ? ?Critical Care performed: Yes, see critical care procedure note(s) ? ?CRITICAL CARE ?Performed by: Nance Pear ? ? ?Total critical care time: 35 minutes ? ?Critical care time was exclusive of separately billable procedures  and treating other patients. ? ?Critical care was necessary to treat or prevent imminent or life-threatening deterioration. ? ?Critical care was time spent personally by me on the following activities: development of treatment plan with patient and/or surrogate as well as nursing, discussions with consultants, evaluation of patient's response to treatment, examination of patient, obtaining history from patient or surrogate, ordering and performing treatments and interventions, ordering and review of laboratory studies, ordering and review of radiographic studies, pulse oximetry and re-evaluation of patient's condition. ? ?Procedures ? ? ?MEDICATIONS ORDERED IN ED: ?Medications - No data to  display ? ? ?IMPRESSION / MDM / ASSESSMENT AND PLAN / ED COURSE  ?I reviewed the triage vital signs and the nursing notes. ?             ?               ? ?Differential diagnosis includes, but is not limited to, infection, anemia, electrolyte abnormality, dehydration. ? ?Patient presents from living facility because of concerns for altered mental status.  Apparently patient is nonverbal at baseline but family and staff noticed decreased responsiveness.  Patient was recently hospitalized and has not taken the last 2 doses of her antibiotics.  On initial exam here patient was awake and alert.  She was nonverbal.  Patient had difficult access.  Eventually charge nurse was able to get peripheral IV.  I had discussed with the patient's family at bedside about potentially placing central line for access.  Blood work here is without leukocytosis or lactic acidosis.  Urine however continues to look infected.  Patient was written for IV Rocephin.  While here in the emergency department the patient's blood pressure did start dropping.  She was given IV fluid boluses without any significant increase in her blood pressure.  Discussed the drop in blood pressure with the patient's family over the telephone.  I did discuss concern for life-threatening illness.  At this time family did not want to pressors tried.  They did want to try pressors through peripheral IV prior to placing central line.  Discussed with ICU who will plan on admission.   ? ?FINAL CLINICAL IMPRESSION(S) / ED DIAGNOSES  ? ?Final diagnoses:  ?Altered mental status, unspecified altered mental status type  ?Lower urinary tract infectious disease  ?Hypotension, unspecified hypotension type  ? ? ? ?Note:  This document was prepared using Dragon voice recognition software and may include unintentional dictation errors. ? ?  ?Nance Pear, MD ?07/19/21 2333 ? ?

## 2021-07-19 NOTE — ED Notes (Signed)
This RN at bedside. Pt's IV noted to be infiltrated. L arm is swollen. This RN raised pt's arm and placed an ice pack on the pts upper arm. Dr. Archie Balboa MD made aware.  ?

## 2021-07-19 NOTE — ED Notes (Signed)
Dr Dustin Flock aware of decreased blood pressures. ?

## 2021-07-19 NOTE — Progress Notes (Signed)
Dear Doctor: Derrill Kay This patient has been identified as a candidate for PICC for the following reason (s): poor veins/poor circulatory system (CHF, COPD, emphysema, diabetes, steroid use, IV drug abuse, etc.) If you agree, please write an order for the indicated device. For any questions contact the Vascular Access Team at [phone] if no answer, please leave a message.  ? ?Informed patient's RN & Dr. Derrill Kay regarding this matter. ?Thank you for supporting the early vascular access assessment program. ? ?HS Nedra Hai RN ? ?

## 2021-07-19 NOTE — ED Notes (Addendum)
Blood  cultures not obtained due to unsuccessful attempts at obtaining IV access.  Meds are IM. ?

## 2021-07-19 NOTE — ED Notes (Signed)
IV team at bedside 

## 2021-07-20 ENCOUNTER — Inpatient Hospital Stay: Payer: Self-pay

## 2021-07-20 DIAGNOSIS — R401 Stupor: Secondary | ICD-10-CM | POA: Diagnosis not present

## 2021-07-20 DIAGNOSIS — E669 Obesity, unspecified: Secondary | ICD-10-CM | POA: Diagnosis present

## 2021-07-20 DIAGNOSIS — Z66 Do not resuscitate: Secondary | ICD-10-CM | POA: Diagnosis present

## 2021-07-20 DIAGNOSIS — R4182 Altered mental status, unspecified: Secondary | ICD-10-CM | POA: Diagnosis present

## 2021-07-20 DIAGNOSIS — R579 Shock, unspecified: Secondary | ICD-10-CM | POA: Diagnosis present

## 2021-07-20 DIAGNOSIS — Z6837 Body mass index (BMI) 37.0-37.9, adult: Secondary | ICD-10-CM | POA: Diagnosis not present

## 2021-07-20 DIAGNOSIS — H7093 Unspecified mastoiditis, bilateral: Secondary | ICD-10-CM | POA: Diagnosis present

## 2021-07-20 DIAGNOSIS — Z20822 Contact with and (suspected) exposure to covid-19: Secondary | ICD-10-CM | POA: Diagnosis present

## 2021-07-20 DIAGNOSIS — Z7189 Other specified counseling: Secondary | ICD-10-CM

## 2021-07-20 DIAGNOSIS — I959 Hypotension, unspecified: Secondary | ICD-10-CM | POA: Diagnosis not present

## 2021-07-20 DIAGNOSIS — Z818 Family history of other mental and behavioral disorders: Secondary | ICD-10-CM | POA: Diagnosis not present

## 2021-07-20 DIAGNOSIS — N390 Urinary tract infection, site not specified: Secondary | ICD-10-CM

## 2021-07-20 DIAGNOSIS — G40409 Other generalized epilepsy and epileptic syndromes, not intractable, without status epilepticus: Secondary | ICD-10-CM | POA: Diagnosis present

## 2021-07-20 DIAGNOSIS — G40909 Epilepsy, unspecified, not intractable, without status epilepticus: Secondary | ICD-10-CM | POA: Diagnosis not present

## 2021-07-20 DIAGNOSIS — R001 Bradycardia, unspecified: Secondary | ICD-10-CM | POA: Diagnosis present

## 2021-07-20 DIAGNOSIS — F88 Other disorders of psychological development: Secondary | ICD-10-CM | POA: Diagnosis present

## 2021-07-20 DIAGNOSIS — H7090 Unspecified mastoiditis, unspecified ear: Secondary | ICD-10-CM | POA: Diagnosis not present

## 2021-07-20 DIAGNOSIS — F79 Unspecified intellectual disabilities: Secondary | ICD-10-CM | POA: Diagnosis present

## 2021-07-20 DIAGNOSIS — Z515 Encounter for palliative care: Secondary | ICD-10-CM | POA: Diagnosis not present

## 2021-07-20 DIAGNOSIS — G9389 Other specified disorders of brain: Secondary | ICD-10-CM | POA: Diagnosis present

## 2021-07-20 DIAGNOSIS — R569 Unspecified convulsions: Secondary | ICD-10-CM

## 2021-07-20 DIAGNOSIS — G319 Degenerative disease of nervous system, unspecified: Secondary | ICD-10-CM | POA: Diagnosis not present

## 2021-07-20 DIAGNOSIS — I10 Essential (primary) hypertension: Secondary | ICD-10-CM | POA: Diagnosis present

## 2021-07-20 DIAGNOSIS — R748 Abnormal levels of other serum enzymes: Secondary | ICD-10-CM | POA: Diagnosis present

## 2021-07-20 DIAGNOSIS — R946 Abnormal results of thyroid function studies: Secondary | ICD-10-CM | POA: Diagnosis present

## 2021-07-20 DIAGNOSIS — G9340 Encephalopathy, unspecified: Secondary | ICD-10-CM | POA: Diagnosis not present

## 2021-07-20 DIAGNOSIS — E785 Hyperlipidemia, unspecified: Secondary | ICD-10-CM | POA: Diagnosis present

## 2021-07-20 DIAGNOSIS — R0902 Hypoxemia: Secondary | ICD-10-CM | POA: Diagnosis not present

## 2021-07-20 DIAGNOSIS — Z7401 Bed confinement status: Secondary | ICD-10-CM | POA: Diagnosis not present

## 2021-07-20 DIAGNOSIS — Q283 Other malformations of cerebral vessels: Secondary | ICD-10-CM | POA: Diagnosis not present

## 2021-07-20 DIAGNOSIS — R4781 Slurred speech: Secondary | ICD-10-CM | POA: Diagnosis present

## 2021-07-20 DIAGNOSIS — F0390 Unspecified dementia without behavioral disturbance: Secondary | ICD-10-CM | POA: Diagnosis not present

## 2021-07-20 DIAGNOSIS — G9341 Metabolic encephalopathy: Secondary | ICD-10-CM | POA: Diagnosis present

## 2021-07-20 DIAGNOSIS — Z79899 Other long term (current) drug therapy: Secondary | ICD-10-CM | POA: Diagnosis not present

## 2021-07-20 DIAGNOSIS — R630 Anorexia: Secondary | ICD-10-CM | POA: Diagnosis present

## 2021-07-20 DIAGNOSIS — Z8673 Personal history of transient ischemic attack (TIA), and cerebral infarction without residual deficits: Secondary | ICD-10-CM | POA: Diagnosis not present

## 2021-07-20 LAB — CORTISOL: Cortisol, Plasma: 11.3 ug/dL

## 2021-07-20 LAB — GLUCOSE, CAPILLARY
Glucose-Capillary: 214 mg/dL — ABNORMAL HIGH (ref 70–99)
Glucose-Capillary: 98 mg/dL (ref 70–99)
Glucose-Capillary: 198 mg/dL — ABNORMAL HIGH (ref 70–99)
Glucose-Capillary: 176 mg/dL — ABNORMAL HIGH (ref 70–99)
Glucose-Capillary: 122 mg/dL — ABNORMAL HIGH (ref 70–99)
Glucose-Capillary: 108 mg/dL — ABNORMAL HIGH (ref 70–99)
Glucose-Capillary: 116 mg/dL — ABNORMAL HIGH (ref 70–99)

## 2021-07-20 LAB — COMPREHENSIVE METABOLIC PANEL
ALT: 23 U/L (ref 0–44)
AST: 26 U/L (ref 15–41)
Albumin: 2.8 g/dL — ABNORMAL LOW (ref 3.5–5.0)
Alkaline Phosphatase: 202 U/L — ABNORMAL HIGH (ref 38–126)
Anion gap: 5 (ref 5–15)
BUN: 15 mg/dL (ref 8–23)
CO2: 28 mmol/L (ref 22–32)
Calcium: 8.8 mg/dL — ABNORMAL LOW (ref 8.9–10.3)
Chloride: 110 mmol/L (ref 98–111)
Creatinine, Ser: 0.62 mg/dL (ref 0.44–1.00)
GFR, Estimated: >60 mL/min (ref >60)
Glucose, Bld: 105 mg/dL — ABNORMAL HIGH (ref 70–99)
Potassium: 4.6 mmol/L (ref 3.5–5.1)
Sodium: 143 mmol/L (ref 135–145)
Total Bilirubin: 0.3 mg/dL (ref 0.3–1.2)
Total Protein: 7.5 g/dL (ref 6.5–8.1)

## 2021-07-20 LAB — PHENOBARBITAL LEVEL: Phenobarbital: 34.9 ug/mL — ABNORMAL HIGH (ref 15.0–30.0)

## 2021-07-20 LAB — LACTIC ACID, PLASMA
Lactic Acid, Venous: 1.4 mmol/L (ref 0.5–1.9)
Lactic Acid, Venous: 1.1 mmol/L (ref 0.5–1.9)

## 2021-07-20 LAB — CBC
HCT: 33.9 % — ABNORMAL LOW (ref 36.0–46.0)
Hemoglobin: 10.3 g/dL — ABNORMAL LOW (ref 12.0–15.0)
MCH: 27.2 pg (ref 26.0–34.0)
MCHC: 30.4 g/dL (ref 30.0–36.0)
MCV: 89.7 fL (ref 80.0–100.0)
Platelets: 229 10*3/uL (ref 150–400)
RBC: 3.78 MIL/uL — ABNORMAL LOW (ref 3.87–5.11)
RDW: 17.5 % — ABNORMAL HIGH (ref 11.5–15.5)
WBC: 2.7 10*3/uL — ABNORMAL LOW (ref 4.0–10.5)
nRBC: 0.8 % — ABNORMAL HIGH (ref 0.0–0.2)

## 2021-07-20 LAB — BLOOD GAS, VENOUS
Acid-Base Excess: 3.6 mmol/L — ABNORMAL HIGH (ref 0.0–2.0)
Bicarbonate: 32 mmol/L — ABNORMAL HIGH (ref 20.0–28.0)
O2 Saturation: 92.5 %
Patient temperature: 37
pCO2, Ven: 65 mmHg — ABNORMAL HIGH (ref 44–60)
pH, Ven: 7.3 (ref 7.25–7.43)
pO2, Ven: 68 mmHg — ABNORMAL HIGH (ref 32–45)

## 2021-07-20 LAB — MAGNESIUM: Magnesium: 2 mg/dL (ref 1.7–2.4)

## 2021-07-20 LAB — T4, FREE: Free T4: 0.74 ng/dL (ref 0.61–1.12)

## 2021-07-20 LAB — TSH: TSH: 12.772 u[IU]/mL — ABNORMAL HIGH (ref 0.350–4.500)

## 2021-07-20 LAB — PHOSPHORUS: Phosphorus: 2.7 mg/dL (ref 2.5–4.6)

## 2021-07-20 LAB — PROCALCITONIN
Procalcitonin: <0.1 ng/mL
Procalcitonin: <0.1 ng/mL

## 2021-07-20 LAB — PHENYTOIN LEVEL, TOTAL: Phenytoin Lvl: 30.2 ug/mL (ref 10.0–20.0)

## 2021-07-20 LAB — MRSA NEXT GEN BY PCR, NASAL: MRSA by PCR Next Gen: NOT DETECTED

## 2021-07-20 MED ORDER — DOPAMINE-DEXTROSE 3.2-5 MG/ML-% IV SOLN
0.0000 ug/kg/min | INTRAVENOUS | Status: DC
  Filled 2021-07-20: qty 250

## 2021-07-20 MED ORDER — LEVOTHYROXINE SODIUM 100 MCG/5ML IV SOLN: 300.0000 ug | Freq: Once | INTRAVENOUS | Status: DC

## 2021-07-20 MED ORDER — BLISTEX MEDICATED EX OINT
TOPICAL_OINTMENT | CUTANEOUS | Status: DC | PRN
  Administered 2021-07-20: 1 via TOPICAL
  Filled 2021-07-20 (×2): qty 6.3

## 2021-07-20 MED ORDER — LORAZEPAM 2 MG/ML IJ SOLN
2.0000 mg | INTRAMUSCULAR | Status: DC | PRN
  Administered 2021-07-20 – 2021-07-22 (×4): 2 mg via INTRAVENOUS
  Filled 2021-07-20 (×4): qty 1

## 2021-07-20 MED ORDER — SODIUM CHLORIDE 0.9 % IV SOLN
2.0000 g | INTRAVENOUS | Status: DC
  Administered 2021-07-20 – 2021-07-21 (×2): 2 g via INTRAVENOUS
  Filled 2021-07-20 (×2): qty 2

## 2021-07-20 MED ORDER — LEVETIRACETAM IN NACL 1000 MG/100ML IV SOLN
1000.0000 mg | Freq: Once | INTRAVENOUS | Status: AC
  Administered 2021-07-20: 1000 mg via INTRAVENOUS
  Filled 2021-07-20: qty 100

## 2021-07-20 MED ORDER — LORAZEPAM 2 MG/ML IJ SOLN
2.0000 mg | Freq: Once | INTRAMUSCULAR | Status: AC
  Administered 2021-07-20: 2 mg via INTRAVENOUS
  Filled 2021-07-20: qty 1

## 2021-07-20 MED ORDER — POLYETHYLENE GLYCOL 3350 17 G PO PACK: 17.0000 g | PACK | Freq: Every day | ORAL | Status: DC | PRN

## 2021-07-20 MED ORDER — SODIUM CHLORIDE 0.9 % IV SOLN
50.0000 mg | Freq: Two times a day (BID) | INTRAVENOUS | Status: DC
  Administered 2021-07-20 (×2): 50 mg via INTRAVENOUS
  Filled 2021-07-20 (×3): qty 5

## 2021-07-20 MED ORDER — LORAZEPAM 2 MG/ML IJ SOLN
2.0000 mg | Freq: Once | INTRAMUSCULAR | Status: DC
Start: 2021-07-20 — End: 2021-07-20

## 2021-07-20 MED ORDER — SODIUM CHLORIDE 0.9 % IV SOLN
50.0000 mg | Freq: Two times a day (BID) | INTRAVENOUS | Status: DC
  Administered 2021-07-20 – 2021-07-28 (×16): 50 mg via INTRAVENOUS
  Filled 2021-07-20 (×18): qty 5

## 2021-07-20 MED ORDER — BISACODYL 10 MG RE SUPP: 10.0000 mg | Freq: Every day | RECTAL | Status: DC | PRN

## 2021-07-20 MED ORDER — SODIUM CHLORIDE 0.9 % IV SOLN
100.0000 mg | Freq: Three times a day (TID) | INTRAVENOUS | Status: DC
  Administered 2021-07-20: 100 mg via INTRAVENOUS
  Filled 2021-07-20: qty 2

## 2021-07-20 MED ORDER — CHLORHEXIDINE GLUCONATE CLOTH 2 % EX PADS
6.0000 | MEDICATED_PAD | Freq: Every day | CUTANEOUS | Status: DC
  Administered 2021-07-20 – 2021-07-21 (×2): 6 via TOPICAL

## 2021-07-20 MED ORDER — LEVETIRACETAM IN NACL 1000 MG/100ML IV SOLN
1000.0000 mg | Freq: Two times a day (BID) | INTRAVENOUS | Status: DC
  Filled 2021-07-20: qty 100

## 2021-07-20 MED ORDER — DOCUSATE SODIUM 100 MG PO CAPS: 100.0000 mg | ORAL_CAPSULE | Freq: Two times a day (BID) | ORAL | Status: DC | PRN

## 2021-07-20 MED ORDER — SODIUM CHLORIDE 0.9 % IV SOLN
100.0000 mg | Freq: Two times a day (BID) | INTRAVENOUS | Status: DC
  Administered 2021-07-20 – 2021-07-28 (×16): 100 mg via INTRAVENOUS
  Filled 2021-07-20 (×18): qty 2

## 2021-07-20 MED ORDER — ENOXAPARIN SODIUM 60 MG/0.6ML IJ SOSY
0.5000 mg/kg | PREFILLED_SYRINGE | INTRAMUSCULAR | Status: DC
  Administered 2021-07-21 – 2021-07-22 (×2): 57.5 mg via SUBCUTANEOUS
  Filled 2021-07-20 (×3): qty 0.57

## 2021-07-20 MED ORDER — PANTOPRAZOLE SODIUM 40 MG IV SOLR
40.0000 mg | INTRAVENOUS | Status: DC
  Administered 2021-07-20 – 2021-07-22 (×3): 40 mg via INTRAVENOUS
  Filled 2021-07-20 (×3): qty 10

## 2021-07-20 MED ORDER — PHENOBARBITAL SODIUM 65 MG/ML IJ SOLN
65.0000 mg | Freq: Once | INTRAMUSCULAR | Status: AC
  Administered 2021-07-20: 65 mg via INTRAVENOUS
  Filled 2021-07-20: qty 1

## 2021-07-20 MED ORDER — PHENOBARBITAL SODIUM 65 MG/ML IJ SOLN
32.4000 mg | Freq: Three times a day (TID) | INTRAMUSCULAR | Status: DC
  Administered 2021-07-20 – 2021-07-28 (×25): 32.4 mg via INTRAVENOUS
  Filled 2021-07-20 (×19): qty 1
  Filled 2021-07-20: qty 0.5
  Filled 2021-07-20 (×5): qty 1

## 2021-07-20 MED ORDER — LEVETIRACETAM IN NACL 1500 MG/100ML IV SOLN
1500.0000 mg | Freq: Two times a day (BID) | INTRAVENOUS | Status: DC
  Administered 2021-07-20 – 2021-07-28 (×16): 1500 mg via INTRAVENOUS
  Filled 2021-07-20 (×18): qty 100

## 2021-07-20 MED ORDER — LACTATED RINGERS IV BOLUS
500.0000 mL | Freq: Once | INTRAVENOUS | Status: AC
  Administered 2021-07-20: 500 mL via INTRAVENOUS

## 2021-07-20 MED ORDER — DOPAMINE-DEXTROSE 3.2-5 MG/ML-% IV SOLN
INTRAVENOUS | Status: AC
  Administered 2021-07-20: 5 ug/kg/min via INTRAVENOUS
  Filled 2021-07-20: qty 250

## 2021-07-20 MED ORDER — NOREPINEPHRINE 16 MG/250ML-% IV SOLN
0.0000 ug/min | INTRAVENOUS | Status: DC
  Administered 2021-07-20: 3 ug/min via INTRAVENOUS
  Administered 2021-07-22: 5 ug/min via INTRAVENOUS
  Filled 2021-07-20 (×2): qty 250

## 2021-07-20 MED ORDER — SODIUM CHLORIDE 0.9 % IV SOLN
750.0000 mg | Freq: Two times a day (BID) | INTRAVENOUS | Status: DC
  Administered 2021-07-20: 750 mg via INTRAVENOUS
  Filled 2021-07-20 (×2): qty 7.5

## 2021-07-20 MED ORDER — HYDROCORTISONE SOD SUC (PF) 100 MG IJ SOLR
100.0000 mg | Freq: Three times a day (TID) | INTRAMUSCULAR | Status: DC
  Administered 2021-07-20 – 2021-07-22 (×7): 100 mg via INTRAVENOUS
  Filled 2021-07-20 (×7): qty 2

## 2021-07-20 MED ORDER — LORAZEPAM 2 MG/ML IJ SOLN
INTRAMUSCULAR | Status: AC
  Administered 2021-07-20: 2 mg
  Filled 2021-07-20: qty 1

## 2021-07-20 MED ORDER — SODIUM CHLORIDE 0.9 % IV SOLN
300.0000 ug | Freq: Once | INTRAVENOUS | Status: AC
  Administered 2021-07-20: 200 ug via INTRAVENOUS
  Filled 2021-07-20: qty 15

## 2021-07-20 MED ORDER — PHENYTOIN SODIUM 50 MG/ML IJ SOLN: 100.0000 mg | Freq: Three times a day (TID) | INTRAMUSCULAR | Status: DC

## 2021-07-20 MED ORDER — SODIUM CHLORIDE 0.9 % IV SOLN: 100.0000 mg | Freq: Two times a day (BID) | INTRAVENOUS | Status: DC

## 2021-07-20 NOTE — ED Notes (Signed)
Starting at 0015 patient having what appeared to be focal seizure activity involving her face.  Ativan IV 2 mg given.  Activity now stopped. ?

## 2021-07-20 NOTE — Progress Notes (Addendum)
Phenytoin level critical at 30.2, discussed with pharmacist. Will adjust phenytoin dosing: ?- adjusted from TID to BID, next dose at 22:00 ?- f/u level at 20:00 ? ?Addendum: ?04:30: worsening bradycardia and hypotension with hypothermia and lethargy. Thyroid labs still pending, recent TSH 8.006. Discussed with Dr. Vladimir Faster who agreed in Synthroid treatment with worsening clinical presentation while awaiting lab work. Discussed with pharmacist. ?- 300 mcg Synthroid IV ordered ? ?>> TSH > 12, T4 low normal 0.74. Discussed with Dr. Vladimir Faster and pharmacy, dose adjusted to 200 mcg. Free T3 added ? ? ?Cheryll Cockayne Rust-Chester, AGACNP-BC ?Acute Care Nurse Practitioner ?[facility]  ? ?[phone] / [phone] ?Please see Amion for pager details.  ? ?

## 2021-07-20 NOTE — Progress Notes (Signed)
eLink Physician-Brief Progress Note ?Patient Name: Mariah Jimenez ?DOB: [birth year] ?MRN: [id number] ? ? ?Date of Service ? 07/20/2021  ?HPI/Events of Note ? 75/F, with recent admission for fever and UTI, who now presents with AMS. Pt as reported to be lethargic and difficult to arouse, prompting SNF to bring her back to the ED.  ?STAT CT head was performed, which was negative for acute process. No leukocytosis or lactic acidosis noted. UA still shows significant pyruia, and pt was continued on IV ceftriaxone.  ?As she was being monitored in the ED, she was noted to develop hypotension, requiring multiple fluid boluses. Given possible need for pressors, she was admitted to the ICU.  ?eICU Interventions ? Sepsis/septic shock ?- Started on ceftriaxone for UTI. Will follow cultures and deescalate as warranted.  ?- Given initial bolus in the ED ?- Levophed ordered. Plan to titrate to target MAP >65 ?- Plan to start on vasopressin, hydorcortisone if with persistent hypotension, requiring high dose levophed.  ?- Consider abdominal imaging to evaluate urinary tract to look for obstruction/hydronephrosis which may require urologic intervention.  ? ?Acute encephalopathy ?- CT head negative for acute process ?- No seizure activity reported ?   - Will continue keppra, vimpat ?   - maintain seizure precautions ?- No hypoglycemia noted ?- No severe electrolyte abnormalities based on labs.  ?- May be due to sepsis, hypotension from above.  ?- Will continue to monitor closely.  ? ? ? ?  ? ? ? ?  ? ?[place] ?07/20/2021, 2:03 AM ?

## 2021-07-20 NOTE — Consult Note (Signed)
NEUROLOGY CONSULTATION NOTE  ? ?Date of service: July 20, 2021 ?Patient Name: Mariah Jimenez ?MRN:  [id number] ?DOB:  September 16, 1945 ?Reason for consult: Dr. Ottie Jimenez ?Requesting physician: Dr. Ottie Jimenez ?_ _ _   _ __   _ __ _ _  __ __   _ __   __ _ ? ?History of Present Illness  ? ?Neurology consulted on this patient with known hx seizures, global developmental delay, nonverbal at baseline admitted to ICU with acute on chronic encephalopathy and c/f breakthrough seizures in setting of inconsistent AED administration at facility and recurrent UTI.  ? ?Patient is unable to provide hx and there is no family at bedside therefore hx gleaned from chart review. Per H&P written by Mariah Jimenez 4/10:  ? ?"76 yo F presenting to [facility] ED from [facility] via EMS on 07/19/21 with complaints of AMS. Patient's niece is bedside providing patient's history. Per niece, the patient is able to talk at baseline but at times is difficult to understand. She is able to sit up and feed herself as well. She is bed bound but has been assisted with equipment out of bed recently. Per her niece family noticed a change around 3/31-4/2 when the patient became extremely lethargic and non interactive. She was then hospitalized on 07/14/21-07/17/21 with seizure activity at her SNF and subsequently discovered UTI, this resolved with admission and Cefdinir completed on Sat 4/8. ?  ?After her recent discharge, patient remained more lethargic then baseline and it is unclear how reliably she has been getting her PO medications, including seizure medications. Per SNF staff they have been crushing them in applesauce and placing them under her tongue. Per the niece, facility staff has alluded to "trying multiple times to give meds". Visible white residue noted on the patient's bottom lip- possibly from recent medication attempts. EMS was called due to family's concerns of extreme lethargy. No complaints of recent seizure activity  after recent discharge. There was previous documentation that the patient's tongue/lip might be swollen-clarified with niece, Mariah Jimenez, that this is not the case and her tongue/bottom lip are at baseline. While in the room patient began to have what appeared to be a partial localized seizure activity involving rhythmic tremors in her face. Niece, Mariah Jimenez, confirmed that this is how her aunt's seizures present, ending with a state appearing like deep sleep with snoring. Ativan 2 mg x 2 given with cessation of seizure like activity. Bolus of Keppra IV ordered to catch patient up on missed AED.  ?  ?Of note TSH on recent admission elevated at 8.006, no T4 drawn at that time. Patient does not have a history of thyroid disease. 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 STAT Thyroid panel and cortisol level ordered, one dose of solu-cortef ordered as well." ? 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 She did not require intubation and has been maintaining her airway. ? ?Patient has longstanding hx seizures and prior to admission regimen was: ?Diazepam 2.5mg  bid ?Lamotrigine (dose schedule unclear, appears to be written for 150mg  qAM + 125mg  qPM) ?Keppra 750mg  bid ?Phenobarbital 32.4mg  tid ?Dilantin 300mg  qhs ?Vimpat 50mg  bid ? ?She was unable to take po on admission therefore has been receiving the following AEDs since admission: ?Lacosamide 50mg  bid ?Keppra 750mg  bid ?Phenobarb 32.4mg  tid ?Phenytoin 100mg   bid (2/2 supratherapeutic level on admission) ?  ?ROS  ? ?UTA 2/2 encephalopathy ? ?Past History  ? ?I have reviewed the following: ? ?Past Medical History:  ?Diagnosis Date  ? Mental retardation   ? Seizures ([place])   ? ?Past Surgical History:  ?Procedure Laterality Date  ? ESOPHAGOGASTRODUODENOSCOPY (EGD) WITH PROPOFOL  N/A 10/10/2019  ? Procedure: ESOPHAGOGASTRODUODENOSCOPY (EGD) WITH PROPOFOL;  Surgeon: Virgel Manifold, MD;  Location: [facility] ENDOSCOPY;  Service: Endoscopy;  Laterality: N/A;  ? NO PAST SURGERIES    ? TIBIA IM NAIL INSERTION Right 04/09/2016  ? Procedure: INTRAMEDULLARY (IM) NAIL TIBIAL;  Surgeon: Corky Mull, MD;  Location: [facility] ORS;  Service: Orthopedics;  Laterality: Right;  ? ?Family History  ?Family history unknown: Yes  ? ?Social History  ? ?Socioeconomic History  ? Marital status: Single  ?  Spouse name: Not on file  ? Number of children: Not on file  ? Years of education: Not on file  ? Highest education level: Not on file  ?Occupational History  ? Not on file  ?Tobacco Use  ? Smoking status: Never  ? Smokeless tobacco: Never  ?Substance and Sexual Activity  ? Alcohol use: No  ? Drug use: No  ? Sexual activity: Not on file  ?Other Topics Concern  ? Not on file  ?Social History Narrative  ? Not on file  ? ?Social Determinants of Health  ? ?Financial Resource Strain: Not on file  ?Food Insecurity: Not on file  ?Transportation Needs: Not on file  ?Physical Activity: Not on file  ?Stress: Not on file  ?Social Connections: Not on file  ? ?No Known Allergies ? ?Medications  ? ?Medications Prior to Admission  ?Medication Sig Dispense Refill Last Dose  ? cefdinir (OMNICEF) 300 MG capsule Take 1 capsule (300 mg total) by mouth 2 (two) times daily. 8 capsule 0 07/19/2021 at 0831  ? diazepam (VALIUM) 5 MG tablet Take 2.5 mg by mouth 2 (two) times daily.   07/19/2021 at 0831  ? lamoTRIgine (LAMICTAL) 150 MG tablet Take 75 mg by mouth daily. Take 1/2 tablet (75mg ) at bedtime   07/18/2021 at 2244  ? lamoTRIgine (LAMICTAL) 25 MG tablet Take 50 mg by mouth daily. Take (2) tablets once daily.   07/19/2021 at 0831  ? levETIRAcetam (KEPPRA) 750 MG tablet Take 750 mg by mouth 2 (two) times daily.   07/19/2021 at 0831  ? loratadine (CLARITIN) 10 MG tablet Take 10 mg by mouth daily.   07/19/2021 at 0831  ? Multiple Vitamin (MULTIVITAMIN  WITH MINERALS) TABS tablet Take 1 tablet by mouth daily.   07/19/2021 at 0831  ? pantoprazole (PROTONIX) 40 MG tablet Take 1 tablet (40 mg total) by mouth daily.  0 07/19/2021 at 0644  ? PHENobarbital (LUMINAL) 32.4 MG tablet Take 1 tablet (32.4 mg total) by mouth 3 (three) times daily.   07/19/2021 at 0644  ? phenytoin (DILANTIN) 300 MG ER capsule Take 300 mg by mouth at bedtime.   07/18/2021 at 2244  ? VIMPAT 50 MG TABS tablet Take 50 mg by mouth 2 (two) times daily.   07/19/2021 at 0831  ? ipratropium-albuterol (DUONEB) 0.5-2.5 (3) MG/3ML SOLN Take 3 mLs by nebulization every 4 (four) hours as needed. PRN wheezing, SOB   prn at unknown  ? LORazepam (ATIVAN) 1 MG tablet Take 1 mg by mouth daily as needed. Special Instructions: Give 1 mg crushed and placed under her tongue for seize activity, may repeat in 30 minutes if seizures  continue.   prn at unknown  ? LORazepam (ATIVAN) 2 MG/ML injection Inject 1 mg into the muscle as needed. Repeat in 30 minutes as needed for seizure activity   prn at unknown  ? SPS 15 GM/60ML suspension Take 60 mLs by mouth every Wednesday.     ?  ? ? ?Current Facility-Administered Medications:  ?  0.9 %  sodium chloride infusion, , Intravenous, PRN, Nance Pear, MD, Last Rate: 10 mL/hr at 07/20/21 0500, Infusion Verify at 07/20/21 0500 ?  bisacodyl (DULCOLAX) suppository 10 mg, 10 mg, Rectal, Daily PRN, Jimenez, Toribio Harbour L, NP ?  cefTRIAXone (ROCEPHIN) 2 g in sodium chloride 0.9 % 100 mL IVPB, 2 g, Intravenous, Q24H, Jimenez, Britton L, NP, Last Rate: 200 mL/hr at 07/20/21 1141, 2 g at 07/20/21 1141 ?  Chlorhexidine Gluconate Cloth 2 % PADS 6 each, 6 each, Topical, Daily, Bennie Pierini, MD, 6 each at 07/20/21 1145 ?  DOPamine (INTROPIN) 800 mg in dextrose 5 % 250 mL (3.2 mg/mL) infusion, 0-20 mcg/kg/min, Intravenous, Titrated, Jimenez, Huel Cote, NP, Stopped at 07/20/21 1024 ?  enoxaparin (LOVENOX) injection 57.5 mg, 0.5 mg/kg, Subcutaneous, Q24H, Jimenez, Britton L,  NP ?  hydrocortisone sodium succinate (SOLU-CORTEF) 100 MG injection 100 mg, 100 mg, Intravenous, Q8H, Jimenez, Britton L, NP, 100 mg at 07/20/21 1147 ?  lacosamide (VIMPAT) 50 mg in sodium chloride

## 2021-07-20 NOTE — Progress Notes (Signed)
Arrived at bedside to place the PICC. Per MD R femoral line placed for now. Will hold off on PICC placement for now. ?

## 2021-07-20 NOTE — Progress Notes (Signed)
PHARMACY CONSULT NOTE - FOLLOW UP ? ?Pharmacy Consult for Electrolyte Monitoring and Replacement  ? ?Recent Labs: ?Potassium (mmol/L)  ?Date Value  ?07/20/2021 4.6  ? ?Magnesium (mg/dL)  ?Date Value  ?07/20/2021 2.0  ? ?Calcium (mg/dL)  ?Date Value  ?07/20/2021 8.8 (L)  ? ?Albumin (g/dL)  ?Date Value  ?07/20/2021 2.8 (L)  ? ?Phosphorus (mg/dL)  ?Date Value  ?07/20/2021 2.7  ? ?Sodium (mmol/L)  ?Date Value  ?07/20/2021 143  ? ? ? ?Assessment: ?76 year old female presenting with seizures. PMH epilepsy, COPD, intellectual disability. Admitted to ICU with shock 2/2 suspected hypovolemia vs myxedema coma vs sepsis. Per RN, patient is verbal at baseline, bed-bound, and eats/drinks independently. Pharmacy consulted to manage electrolytes. ? ?Patient currently NPO. On LR at 125 ml/hr. ? ?Goal of Therapy:  ?Electrolytes within normal limits ? ?Plan:  ?No replacement indicated ?Follow up labs in AM ? ?Wynelle Cleveland, PharmD ?Pharmacy Resident  ?07/20/2021 ?7:05 AM ? ? ? ?

## 2021-07-20 NOTE — Progress Notes (Signed)
Neurology brief note ? ?Neurology consulted on this patient with known hx seizures, global developmental delay, nonverbal at baseline admitted to ICU with acute on chronic encephalopathy and c/f breakthrough seizures in setting of inconsistent AED administration at facility and recurrent UTI. rEEG performed today did not show any definitive electrographic seizures but did show frequent generalized spike and polyspike waves c/w patient's known hx generalized epilepsy. At times these interictals come in bursts lasting up to 4 seconds consistent with brief potentially ictal rhythmic discharges (BIRDs) with moderate potential to evolve into seizures. I considered increasing her vimpat but did not since she has had bradycardia this admission. Instead will increase keppra to 1500mg  q 12 hrs and continue rest of current AED regimen as is. She will need repeat EEG tomorrow. ? ?Full consult note and formal EEG report to follow. ? ? , MD ?[facility] ?[phone] ? ?If 7pm- 7am, please page neurology on call as listed in AMION. ? ?

## 2021-07-20 NOTE — TOC Initial Note (Signed)
Transition of Care (TOC) - Initial/Assessment Note  ? ? ?Patient Details  ?Name: Mariah Jimenez ?MRN: [id number] ?Date of Birth: 07-01-1945 ? ?Transition of Care (TOC) CM/SW Contact:    ?Allayne Butcher, RN ?Phone Number: ?07/20/2021, 10:17 AM ? ?Clinical Narrative:                 ?Patient is from [facility] [place] long term care resident.  Currently in the ICU, hypotensive.  Decreased level of consciousness. ?TOC will cont to follow. ?Per last report patient sits up in the wheelchair at facility and can feed herself.  She makes sounds but is not really verbal at baseline.  Bedside RN reports that this morning patient has been trying to speak. ? ?Expected Discharge Plan: Skilled Nursing Facility ?Barriers to Discharge: Continued Medical Work up ? ? ?Patient Goals and CMS Choice ?Patient states their goals for this hospitalization and ongoing recovery are:: Return to [facility] at [place] ?CMS Medicare.gov Compare Post Acute Care list provided to:: Patient Represenative (must comment) ?Choice offered to / list presented to : Sibling ? ?Expected Discharge Plan and Services ?Expected Discharge Plan: Skilled Nursing Facility ?  ?Discharge Planning Services: CM Consult ?Post Acute Care Choice: Resumption of Svcs/PTA Provider, Skilled Nursing Facility ?Living arrangements for the past 2 months: Single Family Home ?                ?DME Arranged: N/A ?DME Agency: NA ?  ?  ?  ?HH Arranged: NA ?HH Agency: NA ?  ?  ?  ? ?Prior Living Arrangements/Services ?Living arrangements for the past 2 months: Single Family Home ?Lives with:: Facility Resident ?Patient language and need for interpreter reviewed:: Yes ?Do you feel safe going back to the place where you live?: Yes      ?Need for Family Participation in Patient Care: Yes (Comment) ?Care giver support system in place?: Yes (comment) (sister) ?  ?Criminal Activity/Legal Involvement Pertinent to Current Situation/Hospitalization: No - Comment as needed ? ?Activities of Daily Living ?  ?   ? ?Permission Sought/Granted ?Permission sought to share information with : Case Manager, Magazine features editor, Family Supports ?Permission granted to share information with : Yes, Verbal Permission Granted ? Share Information with NAME: Annamarie Major ? Permission granted to share info w AGENCY: [facility] ? Permission granted to share info w Relationship: sister ? Permission granted to share info w Contact Information: [phone] ? ?Emotional Assessment ?Appearance:: Appears stated age ?Attitude/Demeanor/Rapport: Joanne Gavel to Assess ?Affect (typically observed): Unable to Assess ?  ?Alcohol / Substance Use: Not Applicable ?Psych Involvement: No (comment) ? ?Admission diagnosis:  Lower urinary tract infectious disease [N39.0] ?Altered mental status [R41.82] ?Hypotension, unspecified hypotension type [I95.9] ?Altered mental status, unspecified altered mental status type [R41.82] ?Patient Active Problem List  ? Diagnosis Date Noted  ? Altered mental status 07/20/2021  ? Acute metabolic encephalopathy 07/16/2021  ? Acute pyelonephritis 11/13/2020  ? Acute urinary retention 11/13/2020  ? Acute repetitive seizure (HCC) 11/13/2020  ? Seizure (HCC) 11/13/2020  ? 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 Gastric polyp   ? Class 3 obesity (HCC) 10/07/2019  ? Sinus tachycardia 10/05/2019  ? Pressure injury of skin 10/05/2019  ? Dehydration 10/05/2019  ? Intractable nausea and vomiting 10/04/2019  ? Intellectual disability 10/04/2019  ? Elevated lipase 10/04/2019  ? Seizure disorder (HCC) 08/18/2019  ? UTI (urinary tract infection) 08/18/2019  ? Hypothermia 08/18/2019  ? Acute respiratory failure with hypoxia (HCC) 08/18/2019  ? Palliative care encounter 10/06/2018  ?  Debility 10/06/2018  ? Tibia/fibula fracture 04/09/2016  ? ?PCP:  Keane Police, MD ?Pharmacy:   ?[facility] [place], [place] - [address] [facility] [place]. ?[address] [facility] [place] [address] ?Phone: [phone] Fax:  [phone] ? ? ? ? ?Social Determinants of Health (SDOH) Interventions ?  ? ?Readmission Risk Interventions ?   ? View : No data to display.  ?  ?  ?  ? ? ? ?

## 2021-07-20 NOTE — Consult Note (Addendum)
? ?                                                                                ?Consultation Note ?Date: 07/20/2021  ? ?Patient Name: Mariah EhlersBarbara Codrington  ?DOB: 1945-09-05  MRN: [id number]  Age / Sex: 76 y.o., female  ?PCP: Keane PoliceSlade-Hartman, Venezela, MD ?Referring Physician: Vida RiggerAleskerov, Fuad, MD ? ?Reason for Consultation: Establishing goals of care ? ?HPI/Patient Profile: Mariah EhlersBarbara Dollar is a 76 y.o. female  who, per discharge summary dated two days ago had admission for seizure and UTI, who presents to the emergency department today because of concern for altered mental status. The patient is non verbal so cannot give any history. Apparently she was found to be harder to arouse today. The patient had recent admission to the hospital and apparently has not had her past two doses of antibiotics. No report of any seizure like activity today.  ? ?Clinical Assessment and Goals of Care: ?Notes and labs reviewed. Patient is resting in bed with EEG in process. Called sister who states she is in the waiting room. Stepped out to speak with sister who had her foot propped up as she had a knee surgery a couple weeks ago and has difficulty with mobility.  ? ?She states her sister is autistic and has had seizures since being a little girl. She has partial seizures sister states is easy to miss. She tells me patient has been in long term care for the past 4-5 years. She has been bed bound for the past 2 years since a fracture. She states at baseline, her speech is not understood by anyone but family, and here lately, they have had difficulty understanding her due to slurring. She states over the past few weeks she has had poor appetite. ? ?She confirms DNR/DNI status as well as no feeding tube. She would like to treat the treatable at this time, but voices that she understands her prognosis is not good.  ? ?Discussed continuing conversations.    ?  ? ?SUMMARY OF RECOMMENDATIONS   ?PMT will follow along. ? ? ? ?  ? ?Primary  Diagnoses: ?Present on Admission: ? Altered mental status ? ? ?I have reviewed the medical record, interviewed the patient and family, and examined the patient. The following aspects are pertinent. ? ?Past Medical History:  ?Diagnosis Date  ? Mental retardation   ? Seizures (HCC)   ? ?Social History  ? ?Socioeconomic History  ? Marital status: Single  ?  Spouse name: Not on file  ? Number of children: Not on file  ? Years of education: Not on file  ? Highest education level: Not on file  ?Occupational History  ? Not on file  ?Tobacco Use  ? Smoking status: Never  ? Smokeless tobacco: Never  ?Substance and Sexual Activity  ? Alcohol use: No  ? Drug use: No  ? Sexual activity: Not on file  ?Other Topics Concern  ? Not on file  ?Social History Narrative  ? Not on file  ? ?Social Determinants of Health  ? ?Financial Resource Strain: Not on file  ?Food Insecurity: Not on file  ?Transportation Needs: Not on file  ?Physical Activity: Not  on file  ?Stress: Not on file  ?Social Connections: Not on file  ? ?Family History  ?Family history unknown: Yes  ? ?Scheduled Meds: ? Chlorhexidine Gluconate Cloth  6 each Topical Daily  ? enoxaparin (LOVENOX) injection  0.5 mg/kg Subcutaneous Q24H  ? hydrocortisone sod succinate (SOLU-CORTEF) inj  100 mg Intravenous Q8H  ? pantoprazole (PROTONIX) IV  40 mg Intravenous Q24H  ? PHENObarbital  32.4 mg Intravenous TID  ? ?Continuous Infusions: ? sodium chloride 10 mL/hr at 07/20/21 0500  ? cefTRIAXone (ROCEPHIN)  IV 2 g (07/20/21 1141)  ? DOPamine Stopped (07/20/21 1024)  ? lacosamide (VIMPAT) IV 50 mg (07/20/21 1212)  ? lactated ringers 125 mL/hr at 07/20/21 1315  ? levETIRAcetam 750 mg (07/20/21 1208)  ? norepinephrine (LEVOPHED) Adult infusion 5 mcg/min (07/20/21 1453)  ? phenytoin (DILANTIN) IVPB    ? ?PRN Meds:.sodium chloride, bisacodyl, lip balm, LORazepam ?Medications Prior to Admission:  ?Prior to Admission medications   ?Medication Sig Start Date End Date Taking? Authorizing  Provider  ?cefdinir (OMNICEF) 300 MG capsule Take 1 capsule (300 mg total) by mouth 2 (two) times daily. 07/17/21  Yes Danford, Earl Lites, MD  ?diazepam (VALIUM) 5 MG tablet Take 2.5 mg by mouth 2 (two) times daily.   Yes [provider]  ?lamoTRIgine (LAMICTAL) 150 MG tablet Take 75 mg by mouth daily. Take 1/2 tablet (75mg ) at bedtime   Yes [provider]  ?lamoTRIgine (LAMICTAL) 25 MG tablet Take 50 mg by mouth daily. Take (2) tablets once daily.   Yes [provider]  ?levETIRAcetam (KEPPRA) 750 MG tablet Take 750 mg by mouth 2 (two) times daily.   Yes [provider]  ?loratadine (CLARITIN) 10 MG tablet Take 10 mg by mouth daily.   Yes [provider]  ?Multiple Vitamin (MULTIVITAMIN WITH MINERALS) TABS tablet Take 1 tablet by mouth daily.   Yes [provider]  ?pantoprazole (PROTONIX) 40 MG tablet Take 1 tablet (40 mg total) by mouth daily. 11/25/20  Yes Dahal, 11/27/20, MD  ?PHENobarbital (LUMINAL) 32.4 MG tablet Take 1 tablet (32.4 mg total) by mouth 3 (three) times daily. 11/24/20  Yes Dahal, 11/26/20, MD  ?phenytoin (DILANTIN) 300 MG ER capsule Take 300 mg by mouth at bedtime.   Yes [provider]  ?VIMPAT 50 MG TABS tablet Take 50 mg by mouth 2 (two) times daily. 08/19/20  Yes [provider]  ?ipratropium-albuterol (DUONEB) 0.5-2.5 (3) MG/3ML SOLN Take 3 mLs by nebulization every 4 (four) hours as needed. PRN wheezing, SOB    [provider]  ?LORazepam (ATIVAN) 1 MG tablet Take 1 mg by mouth daily as needed. Special Instructions: Give 1 mg crushed and placed under her tongue for seize activity, may repeat in 30 minutes if seizures continue. 12/04/20   [provider]  ?LORazepam (ATIVAN) 2 MG/ML injection Inject 1 mg into the muscle as needed. Repeat in 30 minutes as needed for seizure activity 08/13/20   [provider]  ?SPS 15 GM/60ML suspension Take 60 mLs by mouth every Wednesday. 06/16/19   [provider]  ? ?No Known Allergies ?Review of Systems  ?Unable to perform ROS ? ?Physical Exam ?Constitutional:   ?   Comments: Eyes closed.   ?Pulmonary:  ?   Effort: Pulmonary effort is normal.  ? ? ?Vital Signs: BP (!) 91/41   Pulse 92   Temp (!) 96.4 ?F (35.8 ?C)   Resp 20   Wt 113.3 kg   SpO2 93%  BMI 37.98 kg/m?  ?Pain Scale: PAINAD ?  ?Pain Score: Asleep ? ? ?SpO2: SpO2: 93 % ?O2 Device:SpO2: 93 % ?O2 Flow Rate: .  ? ?IO: Intake/output summary:  ?Intake/Output Summary (Last 24 hours) at 07/20/2021 1552 ?Last data filed at 07/20/2021 3300 ?Gross per 24 hour  ?Intake 2563.8 ml  ?Output 400 ml  ?Net 2163.8 ml  ? ? ?LBM: Last BM Date :  (unknown) ?Baseline Weight: Weight: 113.3 kg ?Most recent weight: Weight: 113.3 kg     ? ?Signed by: ?Morton Stall, NP ?  ?Please contact Palliative Medicine Team phone at [phone] for questions and concerns.  ?For individual provider: See Amion ? ? ? ? ? ? ? ? ? ? ? ? ? ?

## 2021-07-20 NOTE — Procedures (Signed)
Central Venous Catheter Insertion Procedure Note ?Angeles Zehner ?[id number] ?09-Feb-1946 ? ?Procedure: Insertion of Central Venous Catheter ?Indications: Assessment of intravascular volume, Drug and/or fluid administration, and Frequent blood sampling ? ?Procedure Details ?Consent: Unable to obtain consent because of emergent medical necessity. ?Time Out: Verified patient identification, verified procedure, site/side was marked, verified correct patient position, special equipment/implants available, medications/allergies/relevent history reviewed, required imaging and test results available.  Performed ? ?Maximum sterile technique was used including antiseptics, cap, gloves, gown, hand hygiene, mask, and sheet. ?Skin prep: Chlorhexidine; local anesthetic administered ?A antimicrobial bonded/coated triple lumen catheter was placed in the right femoral vein due to emergent situation using the Seldinger technique. ? ?Evaluation ?Blood flow good ?Complications: No apparent complications ?Patient did tolerate procedure well. ?Chest X-ray ordered to verify placement.  CXR:  Not needed for femoral line . ? ? ?Line secured at the 20 cm mark. ?BIOPATCH applied to the insertion site. ? ?Harlon Ditty, AGACNP-BC ?[facility] ?Prefer epic messenger for cross cover needs ?If after hours, please call E-link ? ? ?Judithe Modest ?07/20/2021, 10:57 AM ? ? ?

## 2021-07-20 NOTE — Progress Notes (Signed)
PHARMACIST - PHYSICIAN COMMUNICATION ? ?CONCERNING:  Enoxaparin (Lovenox) for DVT Prophylaxis  ? ? ?RECOMMENDATION: ?Patient was prescribed enoxaprin 40mg  q24 hours for VTE prophylaxis.  ? ?There were no vitals filed for this visit. ? ?There is no height or weight on file to calculate BMI. ? ?Estimated Creatinine Clearance: 81.3 mL/min (by C-G formula based on SCr of 0.74 mg/dL). ? ? ?Based on [facility] policy patient is candidate for enoxaparin 0.5mg /kg TBW SQ every 24 hours based on BMI being >30. ? ?DESCRIPTION: ?Pharmacy has adjusted enoxaparin dose per [facility] policy. ? ?Patient is now receiving enoxaparin 0.5 mg/kg every 24 hours  ? ?[facility], PharmD, MBA ?07/20/2021 ?12:11 AM ? ?

## 2021-07-20 NOTE — Progress Notes (Signed)
Eeg done 

## 2021-07-21 ENCOUNTER — Inpatient Hospital Stay: Payer: Medicare Other

## 2021-07-21 DIAGNOSIS — Z7189 Other specified counseling: Secondary | ICD-10-CM | POA: Diagnosis not present

## 2021-07-21 DIAGNOSIS — I959 Hypotension, unspecified: Secondary | ICD-10-CM | POA: Diagnosis not present

## 2021-07-21 DIAGNOSIS — G9341 Metabolic encephalopathy: Secondary | ICD-10-CM | POA: Diagnosis not present

## 2021-07-21 DIAGNOSIS — R401 Stupor: Secondary | ICD-10-CM | POA: Diagnosis not present

## 2021-07-21 DIAGNOSIS — R569 Unspecified convulsions: Secondary | ICD-10-CM | POA: Diagnosis not present

## 2021-07-21 DIAGNOSIS — R4182 Altered mental status, unspecified: Secondary | ICD-10-CM | POA: Diagnosis not present

## 2021-07-21 LAB — BASIC METABOLIC PANEL
Anion gap: 7 (ref 5–15)
BUN: 14 mg/dL (ref 8–23)
CO2: 25 mmol/L (ref 22–32)
Calcium: 8.6 mg/dL — ABNORMAL LOW (ref 8.9–10.3)
Chloride: 113 mmol/L — ABNORMAL HIGH (ref 98–111)
Creatinine, Ser: 0.79 mg/dL (ref 0.44–1.00)
GFR, Estimated: >60 mL/min (ref >60)
Glucose, Bld: 125 mg/dL — ABNORMAL HIGH (ref 70–99)
Potassium: 4.8 mmol/L (ref 3.5–5.1)
Sodium: 145 mmol/L (ref 135–145)

## 2021-07-21 LAB — CBC
HCT: 31 % — ABNORMAL LOW (ref 36.0–46.0)
Hemoglobin: 9.6 g/dL — ABNORMAL LOW (ref 12.0–15.0)
MCH: 28 pg (ref 26.0–34.0)
MCHC: 31 g/dL (ref 30.0–36.0)
MCV: 90.4 fL (ref 80.0–100.0)
Platelets: 275 10*3/uL (ref 150–400)
RBC: 3.43 MIL/uL — ABNORMAL LOW (ref 3.87–5.11)
RDW: 17.5 % — ABNORMAL HIGH (ref 11.5–15.5)
WBC: 7.4 10*3/uL (ref 4.0–10.5)
nRBC: 1.9 % — ABNORMAL HIGH (ref 0.0–0.2)

## 2021-07-21 LAB — T3, FREE: T3, Free: 1.5 pg/mL — ABNORMAL LOW (ref 2.0–4.4)

## 2021-07-21 LAB — GLUCOSE, CAPILLARY
Glucose-Capillary: 107 mg/dL — ABNORMAL HIGH (ref 70–99)
Glucose-Capillary: 109 mg/dL — ABNORMAL HIGH (ref 70–99)
Glucose-Capillary: 114 mg/dL — ABNORMAL HIGH (ref 70–99)
Glucose-Capillary: 126 mg/dL — ABNORMAL HIGH (ref 70–99)
Glucose-Capillary: 126 mg/dL — ABNORMAL HIGH (ref 70–99)
Glucose-Capillary: 98 mg/dL (ref 70–99)

## 2021-07-21 LAB — PROCALCITONIN: Procalcitonin: 0.14 ng/mL

## 2021-07-21 LAB — PHOSPHORUS: Phosphorus: 2.7 mg/dL (ref 2.5–4.6)

## 2021-07-21 LAB — LACOSAMIDE: Lacosamide: 0.8 ug/mL — ABNORMAL LOW (ref 5.0–10.0)

## 2021-07-21 LAB — MAGNESIUM: Magnesium: 1.9 mg/dL (ref 1.7–2.4)

## 2021-07-21 LAB — AMMONIA: Ammonia: 18 umol/L (ref 9–35)

## 2021-07-21 MED ORDER — SODIUM CHLORIDE 0.9 % IV SOLN
2.0000 g | INTRAVENOUS | Status: DC
  Administered 2021-07-21 – 2021-07-22 (×4): 2 g via INTRAVENOUS
  Filled 2021-07-21 (×2): qty 2000
  Filled 2021-07-21 (×3): qty 2
  Filled 2021-07-21: qty 2000
  Filled 2021-07-21: qty 2

## 2021-07-21 MED ORDER — GADOBUTROL 1 MMOL/ML IV SOLN
10.0000 mL | Freq: Once | INTRAVENOUS | Status: AC | PRN
  Administered 2021-07-21: 10 mL via INTRAVENOUS

## 2021-07-21 NOTE — Procedures (Signed)
Routine EEG Report ? ?Mariah Jimenez is a 76 y.o. female with a history of seizures who is undergoing an EEG to evaluate for seizures. ? ?Report: This EEG was acquired with electrodes placed according to the International 10-20 electrode system (including Fp1, Fp2, F3, F4, C3, C4, P3, P4, O1, O2, T3, T4, T5, T6, A1, A2, Fz, Cz, Pz). The following electrodes were missing or displaced: none. ? ?The occipital dominant rhythm was 5-7 Hz. This activity is reactive to stimulation. Drowsiness was manifested by background fragmentation; deeper stages of sleep were identified by K complexes and sleep spindles. There was no focal slowing. There were frequent generalized spike and polyspike waves at the beginning of the recording which abated after a few minutes and were rare for the remainder of the recording. There was no definitive electrographic seizures reported. There was no abnormal response to photic stimulation. Hyperventilation was not performed. ? ?Impression and clinical correlation: This EEG was obtained while awake and asleep and is abnormal due: ?- mild diffuse slowing indicative of global cerebral dysfunction ?- generalized spike and polyspike waves c/w patient's known hx generalized epilepsy ? ?There were no definitive seizures seen during this recording. ? ?Bing Neighbors, MD ?[facility] ?[phone] ?

## 2021-07-21 NOTE — Progress Notes (Signed)
? ?NAME:  Mariah Jimenez, MRN:  [id number], DOB:  07/30/1945, LOS: 1 ?ADMISSION DATE:  07/19/2021, CONSULTATION DATE:   ?REFERRING MD:  Dr. Carter Kitten, CHIEF COMPLAINT:  AMS  ? ?History of Present Illness:  ?76 yo F presenting to [facility] ED from [facility] via EMS on 07/19/21 with complaints of AMS. Patient's niece is bedside providing patient's history. Per niece, the patient is able to talk at baseline but at times is difficult to understand. She is able to sit up and feed herself as well. She is bed bound but has been assisted with equipment out of bed recently. Per her niece family noticed a change around 3/31-4/2 when the patient became extremely lethargic and non interactive. She was then hospitalized on 07/14/21-07/17/21 with seizure activity at her SNF and subsequently discovered UTI, this resolved with admission and Cefdinir completed on Sat 4/8. ? ?After her recent discharge, patient remained more lethargic then baseline and it is unclear how reliably she has been getting her PO medications, including seizure medications. Per SNF staff they have been crushing them in applesauce and placing them under her tongue. Per the niece, facility staff has alluded to "trying multiple times to give meds". Visible white residue noted on the patient's bottom lip- possibly from recent medication attempts. EMS was called due to family's concerns of extreme lethargy. No complaints of recent seizure activity after recent discharge. There was previous documentation that the patient's tongue/lip might be swollen-clarified with niece, [name], that this is not the case and her tongue/bottom lip are at baseline. While in the room patient began to have what appeared to be a partial localized seizure activity involving rhythmic tremors in her face. Niece, [name], confirmed that this is how her aunt's seizures present, ending with a state appearing like deep sleep with snoring. Ativan 2 mg x 2 given with cessation of seizure like  activity. Bolus of Keppra IV ordered to catch patient up on missed AED.  ? ? ?CXR 07/19/21: possible atelectasis in bilateral lung bases, otherwise no acute cardiopulmonary process. ?CT head wo contrast: no acute intracranial abnormality. 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 Bilateral mastoid effusions again noted. (Did not note previous reading of lacunar infarct, discussed with Radiologist who looked back on previous [place] from 4/4 and 11/2020 noting a similar presentation in the lacunar area- so not a new finding) ? ?PCCM consulted for admission due to hypotension requiring peripheral vasopressor support. ? ?Pertinent  Medical History  ?Intellectual Disability ?Seizure ?HTN ?HLD ? ?Significant Hospital Events: ?Including procedures, antibiotic start and stop dates in addition to other pertinent events   ?07/20/21: Admit to ICU with hypotension s/t suspected hypovolemia vs myxedema coma on peripheral vasopressor support. ?07/21/21- patient remains critically ill unresponsive to sternal rub. S/P evalauation with neurology and plan for repeat EEG possible transfer for cEEG ?Objective   ?Blood pressure (!) 106/43, pulse 86, temperature 98.8 ?F (37.1 ?C), resp. rate 17, weight 113.1 kg, SpO2 100 %. ?   ?   ? ?Intake/Output Summary (Last 24 hours) at 07/21/2021 0836 ?Last data filed at 07/21/2021 0600 ?Gross per 24 hour  ?Intake 3526.75 ml  ?Output 870 ml  ?Net 2656.75 ml  ? ? ?Filed Weights  ? 07/20/21 0151 07/21/21 0500  ?Weight: 113.3 kg 113.1 kg  ? ? ?Examination: ?General: Adult female, acutely ill, lying in bed, NAD, morbidly obese ?HEENT: MM pink/dry, anicteric, atraumatic, neck supple ?Neuro: non verbal, unable to follow commands, eye opening spont ?PERRL +3, MAE- generalized weakness ?CV:  s1s2 RRR, SB with 1st degree HB on monitor, no r/m/g ?Pulm: Regular, non labored on RA, breath sounds clear/diminished throughout ?GI: soft, rounded, non tender, bs x 4 ?GU: pure wick in place with clear yellow urine , +Fem  TLC ?Skin: no rashes/lesions noted ?Extremities: cool/dry, pulses + 2 R/P, no edema noted ? ?Resolved Hospital Problem list   ? ? ?Assessment & Plan:  ? ?Sepsis due to UTI  ?   Present on admision - UA with many bacteria ?   - required vasopressor support and abx on arrival  ?Patient received 2 L IVF with improvement in BP, no leukocytosis or lactic acidosis to suggest septic shock ?- continue LR infusion ?- peripheral levophed PRN to maintain MAP > 65 ? ?Acute on chronic seizure disorder with possible status epilepticus ?Acute Encephalopathy unclear etiology ?PMHx: seizure disorder, intellectual disability ?CTH negative for acute abnormality ?-neuro on case appreciate refining of therapy ?- Vimpat & Phenobarbital ?- Dilantin dose converted to 100 mg IV Q 8 from ER PO dosage ?- MRI  ?- Ativan IV PRN for seizure activity ? ? ?Possible hypothyroidism ?S/p synthroid ?Recent TSH elevated acutely 8.006 ?- STAT thyroid panel, cortisol ?- solu-cortef x 1 ordered, consider additional dosing and synthroid PRN depending on lab results ?- apply bear hugger PRN ? ?Bilateral Mastoid Effusions ?Recent UTI completed antibiotic course of Cefdinir ?UA: +lrg leuks, pyuria, > 50 WBC ?- rocephin IV 2 g, 5 day course ?- f/u & trend PCT, daily CBC, monitor WBC & fever curve ?- consider ENT consult  ? ?Elevated Alk Phos ?- appears to be baseline in the 200's, previous CT abd/pelvis & [place] have ruled out acute issue. AST, ALT normal range ?Best Practice (right click and "Reselect all SmartList Selections" daily)  ?Diet/type: NPO ?DVT prophylaxis: LMWH ?GI prophylaxis: PPI ?Lines: N/A ?Foley:  N/A ?Code Status:  DNR ?Last date of multidisciplinary goals of care discussion [07/20/21] ? ?Confirmed DNR/DNI with niece, [name], bedside. EDP confirmed with sister [name] prior to admission. Palliative care follows patient at SNF, will consult to assist in ongoing treatment plan discussions. ? ?Labs   ?CBC: ?Recent Labs  ?Lab 07/14/21 ?1528  07/15/21 ?0630 07/17/21 ?1309 07/19/21 ?1738 07/20/21 ?0213 07/21/21 ?0400  ?WBC 6.3 5.5 4.8 4.0 2.7* 7.4  ?NEUTROABS 3.8  --   --  1.3*  --   --   ?HGB 10.6* 10.4* 10.2* 11.3* 10.3* 9.6*  ?HCT 34.0* 32.5* 32.0* 35.9* 33.9* 31.0*  ?MCV 89.9 86.7 89.1 90.0 89.7 90.4  ?PLT 284 265 273 296 229 275  ? ? ? ?Basic Metabolic Panel: ?Recent Labs  ?Lab 07/15/21 ?0630 07/17/21 ?1309 07/19/21 ?1738 07/20/21 ?0213 07/21/21 ?0400 07/21/21 ?0410  ?NA 142 142 143 143 145  --   ?K 5.0 5.1 5.1 4.6 4.8  --   ?CL 111 109 109 110 113*  --   ?CO2 $Rem'22 24 27 28 25  'PoOs$ --   ?GLUCOSE 86 140* 93 105* 125*  --   ?BUN $Rem'16 19 17 15 14  'Xsee$ --   ?CREATININE 0.60 0.95 0.74 0.62 0.79  --   ?CALCIUM 8.5* 8.7* 9.2 8.8* 8.6*  --   ?MG  --   --   --  2.0 1.9  --   ?PHOS  --   --   --  2.7  --  2.7  ? ? ?GFR: ?Estimated Creatinine Clearance: 81.4 mL/min (by C-G formula based on SCr of 0.79 mg/dL). ?Recent Labs  ?Lab 07/17/21 ?1309 07/19/21 ?1738 07/20/21 ?9675  07/20/21 ?8097 07/20/21 ?1058 07/20/21 ?1426 07/21/21 ?0400  ?PROCALCITON  --  <0.10 <0.10  --   --   --  0.14  ?WBC 4.8 4.0  --  2.7*  --   --  7.4  ?LATICACIDVEN  --  0.8  --   --  1.4 1.1  --   ? ? ? ?Liver Function Tests: ?Recent Labs  ?Lab 07/14/21 ?1528 07/17/21 ?1309 07/19/21 ?1738 07/20/21 ?0449  ?AST $Rem'26 28 31 26  'zlaa$ ?ALT $Re'16 17 22 23  'fRD$ ?ALKPHOS 213* 184* 217* 202*  ?BILITOT 0.5 0.4 0.6 0.3  ?PROT 7.9 6.9 8.0 7.5  ?ALBUMIN 2.9* 2.5* 3.1* 2.8*  ? ? ?No results for input(s): LIPASE, AMYLASE in the last 168 hours. ?Recent Labs  ?Lab 07/16/21 ?2230  ?AMMONIA 35  ? ? ? ?ABG ?   ?Component Value Date/Time  ? HCO3 32.0 (H) 07/20/2021 2524  ? O2SAT 92.5 07/20/2021 0212  ? ?  ? ?Coagulation Profile: ?No results for input(s): INR, PROTIME in the last 168 hours. ? ?Cardiac Enzymes: ?No results for input(s): CKTOTAL, CKMB, CKMBINDEX, TROPONINI in the last 168 hours. ? ?HbA1C: ?Hgb A1c MFr Bld  ?Date/Time Value Ref Range Status  ?09/18/2020 02:24 PM 5.4 4.8 - 5.6 % Final  ?  Comment:  ?  (NOTE) ?         Prediabetes: 5.7 - 6.4 ?        Diabetes: >6.4 ?        Glycemic control for adults with diabetes: <7.0 ?  ?04/09/2016 01:33 AM 5.2 4.8 - 5.6 % Final  ?  Comment:  ?  (NOTE) ?        Pre-diabetes: 5.7 - 6.4 ?        Diabetes: >6.4 ?

## 2021-07-21 NOTE — Progress Notes (Addendum)
? ?                                                                                                                                                     ?                                                   ?Daily Progress Note  ? ?Patient Name: Mariah Jimenez       Date: 07/21/2021 ?DOB: 1946-01-27  Age: 76 y.o. MRN#: [id number] ?Attending Physician: Vida Rigger, MD ?Primary Care Physician: Keane Police, MD ?Admit Date: 07/19/2021 ? ?Reason for Consultation/Follow-up: Establishing goals of care ? ?Subjective: ?Notes and labs reviewed. Patient's sister in to bedside. Nursing at bedside with me. Discussed status and updates. Discussed that she is on a pressor. Sister is okay with pressors. Questions answered regarding continuing care vs comfort focused care, and talked about family discussing boundaries on acceptable QOL. They would like to see how she does. Again confirmed DNR/DNI status. No feeding tube of any kind- use IV fluids. I will follow up Thursday on my return.   ? ?Length of Stay: 1 ? ?Current Medications: ?Scheduled Meds:  ? Chlorhexidine Gluconate Cloth  6 each Topical Daily  ? enoxaparin (LOVENOX) injection  0.5 mg/kg Subcutaneous Q24H  ? hydrocortisone sod succinate (SOLU-CORTEF) inj  100 mg Intravenous Q8H  ? pantoprazole (PROTONIX) IV  40 mg Intravenous Q24H  ? PHENObarbital  32.4 mg Intravenous TID  ? ? ?Continuous Infusions: ? sodium chloride 10 mL/hr at 07/21/21 1000  ? cefTRIAXone (ROCEPHIN)  IV 2 g (07/21/21 1025)  ? lacosamide (VIMPAT) IV Stopped (07/21/21 0950)  ? lactated ringers 75 mL/hr at 07/21/21 1000  ? levETIRAcetam Stopped (07/21/21 0802)  ? norepinephrine (LEVOPHED) Adult infusion 3 mcg/min (07/21/21 1000)  ? phenytoin (DILANTIN) IVPB Stopped (07/21/21 7001)  ? ? ?PRN Meds: ?sodium chloride, bisacodyl, lip balm, LORazepam ? ?Physical Exam ?Constitutional:   ?   Comments: Attempts to open eyes.  ?Pulmonary:  ?   Effort: Pulmonary effort is normal.  ?         ? ?Vital Signs:  BP (!) 125/50   Pulse 88   Temp (!) 96.3 ?F (35.7 ?C) (Axillary)   Resp 15   Wt 113.1 kg   SpO2 97%   BMI 37.91 kg/m?  ?SpO2: SpO2: 97 % ?O2 Device: O2 Device: Room Air ?O2 Flow Rate:   ? ?Intake/output summary:  ?Intake/Output Summary (Last 24 hours) at 07/21/2021 1307 ?Last data filed at 07/21/2021 1000 ?Gross per 24 hour  ?Intake 4126.09 ml  ?Output 870 ml  ?Net 3256.09 ml  ? ?LBM: Last BM Date :  (unknown) ?Baseline Weight: Weight: 113.3  kg ?Most recent weight: Weight: 113.1 kg ? ? ?Patient Active Problem List  ? Diagnosis Date Noted  ? Altered mental status 07/20/2021  ? Acute metabolic encephalopathy 07/16/2021  ? Acute pyelonephritis 11/13/2020  ? Acute urinary retention 11/13/2020  ? Acute repetitive seizure (HCC) 11/13/2020  ? Seizure (HCC) 11/13/2020  ? 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 Gastric polyp   ? Class 3 obesity (HCC) 10/07/2019  ? Sinus tachycardia 10/05/2019  ? Pressure injury of skin 10/05/2019  ? Dehydration 10/05/2019  ? Intractable nausea and vomiting 10/04/2019  ? Intellectual disability 10/04/2019  ? Elevated lipase 10/04/2019  ? Seizure disorder (HCC) 08/18/2019  ? UTI (urinary tract infection) 08/18/2019  ? Hypothermia 08/18/2019  ? Acute respiratory failure with hypoxia (HCC) 08/18/2019  ? Palliative care encounter 10/06/2018  ? Debility 10/06/2018  ? Tibia/fibula fracture 04/09/2016  ? ? ?Palliative Care Assessment & Plan  ? ? ? ?Recommendations/Plan: ?DNR/DNI.  ?No feeding tube of any kind- use IV fluids.  ?Family needs time for outcomes.  ?I will follow up Thursday on my return.  ? ? ?Code Status: ? ?  ?Code Status Orders  ?(From admission, onward)  ?  ? ? ?  ? ?  Start     Ordered  ? 07/20/21 0002  Do not attempt resuscitation (DNR)  Continuous       ?Question Answer Comment  ?In the event of cardiac or respiratory ARREST Do not call a ?code blue?   ?In the event of cardiac or respiratory ARREST Do not perform Intubation, CPR, defibrillation or ACLS   ?In the event of  cardiac or respiratory ARREST Use medication by any route, position, wound care, and other measures to relive pain and suffering. May use oxygen, suction and manual treatment of airway obstruction as needed for comfort.   ?Comments CODE STATUS was discussed with patient's wife over the phone and she is a DNR.   ?  ? 07/20/21 0003  ? ?  ?  ? ?  ? ?Code Status History   ? ? Date Active Date Inactive Code Status Order ID Comments User Context  ? 07/14/2021 1825 07/17/2021 2058 DNR [id number]  Lurline Del, MD ED  ? 11/13/2020 1045 11/24/2020 2024 DNR [id number]  Lucile Shutters, MD ED  ? 11/13/2020 0356 11/13/2020 1045 Full Code [id number]  Andris Baumann, MD ED  ? 10/01/2020 1224 10/01/2020 2036 DNR [id number]  Willy Eddy, MD ED  ? 09/18/2020 1515 09/22/2020 0110 DNR [id number]  Gilles Chiquito, MD ED  ? 08/12/2020 1731 08/14/2020 1758 DNR [id number]  Pieter Partridge, MD ED  ? 10/04/2019 2027 10/16/2019 2043 DNR [id number]  Andris Baumann, MD ED  ? 08/18/2019 1656 08/21/2019 1641 Partial Code [id number]  Lorretta Harp, MD ED  ? 04/09/2016 0310 04/15/2016 2116 Full Code [id number]  Arnaldo Natal, MD ED  ? ?  ? ?Care plan was discussed with RN ? ?Thank you for allowing the Palliative Medicine Team to assist in the care of this patient. ? ?ve assessment and plan. ? ?Morton Stall, NP ? ?Please contact Palliative Medicine Team phone at [phone] for questions and concerns.  ? ? ? ? ? ?

## 2021-07-21 NOTE — Progress Notes (Signed)
PHARMACY CONSULT NOTE - FOLLOW UP ? ?Pharmacy Consult for Electrolyte Monitoring and Replacement  ? ?Recent Labs: ?Potassium (mmol/L)  ?Date Value  ?07/21/2021 4.8  ? ?Magnesium (mg/dL)  ?Date Value  ?07/21/2021 1.9  ? ?Calcium (mg/dL)  ?Date Value  ?07/21/2021 8.6 (L)  ? ?Albumin (g/dL)  ?Date Value  ?07/20/2021 2.8 (L)  ? ?Phosphorus (mg/dL)  ?Date Value  ?07/20/2021 2.7  ? ?Sodium (mmol/L)  ?Date Value  ?07/21/2021 145  ? ?Corrected Calcium: 9.56 mg/dl ? ?Assessment: ?76 year old female presenting with seizures. PMH epilepsy, COPD, intellectual disability. Admitted to ICU with shock 2/2 suspected hypovolemia vs myxedema coma vs sepsis. Per RN, patient is verbal at baseline, bed-bound, and eats/drinks independently. Pharmacy consulted to manage electrolytes. ? ?Patient currently NPO. On LR at 75 ml/hr.Rate reduced from 125 ml/hr on 4/10. ? ?Goal of Therapy:  ?Electrolytes within normal limits ? ?Plan:  ?No replacement indicated ?Follow up labs in AM ? ? ?Jaynie Bream, PharmD ?Pharmacy Resident  ?07/21/2021 ?7:03 AM ? ?

## 2021-07-21 NOTE — Procedures (Signed)
Routine EEG Report ? ?Mariah Jimenez is a 76 y.o. female with a history of seizures who is undergoing an EEG to evaluate for seizures. ? ?Report: This EEG was acquired with electrodes placed according to the International 10-20 electrode system (including Fp1, Fp2, F3, F4, C3, C4, P3, P4, O1, O2, T3, T4, T5, T6, A1, A2, Fz, Cz, Pz). The following electrodes were missing or displaced: none. ? ?The occipital dominant rhythm was 5-7 Hz. This activity is reactive to stimulation. Drowsiness was manifested by background fragmentation; deeper stages of sleep were identified by K complexes and sleep spindles. There was no focal slowing. There were frequent generalized spike and polyspike waves. At times these interictals come in bursts lasting up to 4 seconds consistent with brief potentially ictal rhythmic discharges (BIRDs). There was no definitive electrographic seizures reported. There was no abnormal response to photic stimulation. Hyperventilation was not performed. ? ?Impression and clinical correlation: This EEG was obtained while awake and asleep and is abnormal due: ?- mild diffuse slowing indicative of global cerebral dysfunction ?- frequent generalized spike and polyspike waves c/w patient's known hx generalized epilepsy. I do not have a previous recording to compare to. ?- at times these interictals come in bursts lasting up to 4 seconds consistent with brief potentially ictal rhythmic discharges (BIRDs) with moderate potential to evolve into seizures. ? ? ?Bing Neighbors, MD ?[facility] ?[phone] ?

## 2021-07-21 NOTE — Progress Notes (Signed)
Eeg done 

## 2021-07-21 NOTE — Progress Notes (Signed)
During afternoon assessment, patient observed with facial twitching that started in eyes and then spread to lips. Twitching became more pronounced and then patient's breathing started making a high pitched noise. Sister at bedside stated that patient appeared to be having a seizure. Had just finished giving scheduled IV phenobarbital, administered IV ativan dose. Twitching eased up but was still present, vocalization stopped.Vital stable throughout entire event. Notified Dr. Karna Christmas about persistent twitching and evaluated remotely. No new orders, continue to monitor. ?

## 2021-07-21 NOTE — Progress Notes (Addendum)
S: I was contacted by primary team to say that patient was unresponsive. I went to examine her and she was minimally responsive to pain, not following commands, unchanged from my exam from yesterday. 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 Weaning pressor support.  ? ?EEG today and yesterday showed frequent generalized spike and polyspike wave discharges c/w patient's known hx epilepsy but no definitive electrographic seizures. ? ? ?O: ? ?Vitals:  ? 07/21/21 1315 07/21/21 1330  ?BP: (!) 62/32 (!) 73/39  ?Pulse: 79 78  ?Resp: 18 17  ?Temp:  (!) 94.4 ?F (34.7 ?C)  ?SpO2: 96% 96%  ? ?Physical Exam ?Gen: obtunded, briefly flutters eyes in response to vigorous sternal rub ?Resp: slightly increased WOB ?CV: RRR. ?  ?Neuro: ?*MS: obtunded, briefly flutters eyes in response to vigorous sternal rub, does not follow commands ?*Speech: no attempt to speak ?*CN: PERRL, (+) corneals, oculocephalics, gag. Face symmetric at rest. ?*Motor & sensory: no response to noxious stimuli in any extremity ?*Coordination:  UTA ?*Reflexes:  1+ and symmetric throughout without clonus; toes mute bilat ?*Gait: deferred ? ?A/P: Neurology consulted on this patient with known hx seizures, global developmental delay, nonverbal at baseline admitted to ICU with acute on chronic encephalopathy and c/f breakthrough seizures in setting of inconsistent AED administration at facility and recurrent UTI. Hospital stay has been complicated by hypotension and bradycardia, although team is now weaning pressor support. There is some concern about possible myxedema coma. She has had one clinical seizure this admission while she was in ED. EEG today and yesterday showed frequent generalized spike and polyspike wave discharges c/w patient's known hx epilepsy but no definitive electrographic seizures. Based on BIRDs seen on rEEG yesterday I increased her kepra to 1500mg  bid which she has been tolerating well. We still do not have a definitive explanation for  the persistence of this severe encephalopathy so will proceed with MRI brain today. ?  ?Recommendations  ?  ?- Continue current AED regimen: ?Vimpat 50mg  bid ?Increase keppra to 1500mg  bid ?Phenobarb 32.4mg  tid ?Phenytoin 100mg  bid (2/2 supratherapeutic level on admission) ?- She takes a small standing dose of valium bid at home but we are holding this 2/2 precarious mental status - do not want to precipitate need for intubation ?- If she continues to be bradycardic may have to switch vimpat to alternative agent; vimpat lengthens PR interval ?- MRI brain wwo today ?- If she has more clinical seizures, or if she continues to have very active interictals on EEG and persistent encephalopathy, we may need to consider transfer to a facility capable of cEEG if that would be c/w family GOC ?- Seizure precations ? ?I spoke to sister [name] at length by phone regarding test results and next steps in workup. ? ?This patient is critically ill and at significant risk of neurological worsening, death and care requires constant monitoring of vital signs, hemodynamics,respiratory and cardiac monitoring, neurological assessment, discussion with family, other specialists and medical decision making of high complexity. I spent 45 minutes of neurocritical care time  in the care of  this patient. This was time spent independent of any time provided by nurse practitioner or PA. ? ? , MD ?[facility] ?[phone] ? ?If 7pm- 7am, please page neurology on call as listed in AMION. ? ? ?

## 2021-07-22 DIAGNOSIS — R4182 Altered mental status, unspecified: Secondary | ICD-10-CM

## 2021-07-22 DIAGNOSIS — R569 Unspecified convulsions: Secondary | ICD-10-CM | POA: Diagnosis not present

## 2021-07-22 LAB — PHENYTOIN LEVEL, FREE AND TOTAL
Phenytoin, Free: 3.6 ug/mL — ABNORMAL HIGH (ref 1.0–2.0)
Phenytoin, Total: 30.5 ug/mL — ABNORMAL HIGH (ref 10.0–20.0)

## 2021-07-22 LAB — CBC
HCT: 30.7 % — ABNORMAL LOW (ref 36.0–46.0)
Hemoglobin: 9.6 g/dL — ABNORMAL LOW (ref 12.0–15.0)
MCH: 28.2 pg (ref 26.0–34.0)
MCHC: 31.3 g/dL (ref 30.0–36.0)
MCV: 90.3 fL (ref 80.0–100.0)
Platelets: 191 10*3/uL (ref 150–400)
RBC: 3.4 MIL/uL — ABNORMAL LOW (ref 3.87–5.11)
RDW: 17.4 % — ABNORMAL HIGH (ref 11.5–15.5)
WBC: 7.8 10*3/uL (ref 4.0–10.5)
nRBC: 0.5 % — ABNORMAL HIGH (ref 0.0–0.2)

## 2021-07-22 LAB — BASIC METABOLIC PANEL
Anion gap: 7 (ref 5–15)
BUN: 11 mg/dL (ref 8–23)
CO2: 27 mmol/L (ref 22–32)
Calcium: 8.5 mg/dL — ABNORMAL LOW (ref 8.9–10.3)
Chloride: 111 mmol/L (ref 98–111)
Creatinine, Ser: 0.67 mg/dL (ref 0.44–1.00)
GFR, Estimated: >60 mL/min (ref >60)
Glucose, Bld: 122 mg/dL — ABNORMAL HIGH (ref 70–99)
Potassium: 4.3 mmol/L (ref 3.5–5.1)
Sodium: 145 mmol/L (ref 135–145)

## 2021-07-22 LAB — MAGNESIUM: Magnesium: 1.9 mg/dL (ref 1.7–2.4)

## 2021-07-22 LAB — PHOSPHORUS: Phosphorus: 2.6 mg/dL (ref 2.5–4.6)

## 2021-07-22 LAB — URINE CULTURE: Culture: >=100000 — AB

## 2021-07-22 LAB — GLUCOSE, CAPILLARY
Glucose-Capillary: 107 mg/dL — ABNORMAL HIGH (ref 70–99)
Glucose-Capillary: 129 mg/dL — ABNORMAL HIGH (ref 70–99)
Glucose-Capillary: 112 mg/dL — ABNORMAL HIGH (ref 70–99)

## 2021-07-22 MED ORDER — GLYCOPYRROLATE 0.2 MG/ML IJ SOLN
0.2000 mg | INTRAMUSCULAR | Status: DC | PRN
  Administered 2021-07-22 – 2021-07-23 (×3): 0.2 mg via INTRAVENOUS
  Filled 2021-07-22 (×3): qty 1

## 2021-07-22 MED ORDER — HYDROCORTISONE SOD SUC (PF) 100 MG IJ SOLR: 100.0000 mg | Freq: Two times a day (BID) | INTRAMUSCULAR | Status: DC

## 2021-07-22 MED ORDER — CHLORHEXIDINE GLUCONATE 0.12 % MT SOLN
15.0000 mL | Freq: Two times a day (BID) | OROMUCOSAL | Status: DC
  Administered 2021-07-22: 15 mL via OROMUCOSAL
  Filled 2021-07-22: qty 15

## 2021-07-22 MED ORDER — ALBUTEROL SULFATE (2.5 MG/3ML) 0.083% IN NEBU
2.5000 mg | INHALATION_SOLUTION | RESPIRATORY_TRACT | Status: DC | PRN
Start: 2021-07-22 — End: 2021-07-28

## 2021-07-22 MED ORDER — LORAZEPAM 2 MG/ML IJ SOLN: 2.0000 mg | INTRAMUSCULAR | Status: DC | PRN

## 2021-07-22 MED ORDER — ORAL CARE MOUTH RINSE
15.0000 mL | Freq: Two times a day (BID) | OROMUCOSAL | Status: DC
  Administered 2021-07-22 – 2021-07-28 (×13): 15 mL via OROMUCOSAL

## 2021-07-22 MED ORDER — MORPHINE SULFATE (PF) 2 MG/ML IV SOLN
1.0000 mg | INTRAVENOUS | Status: DC | PRN
  Administered 2021-07-25 – 2021-07-27 (×3): 2 mg via INTRAVENOUS
  Filled 2021-07-22 (×3): qty 1

## 2021-07-22 MED ORDER — SODIUM CHLORIDE 0.9 % IV SOLN
2.0000 g | Freq: Four times a day (QID) | INTRAVENOUS | Status: DC
  Administered 2021-07-22: 2 g via INTRAVENOUS
  Filled 2021-07-22: qty 2000
  Filled 2021-07-22: qty 2

## 2021-07-22 NOTE — Progress Notes (Addendum)
[name] sister was made ware of transferred of pt to rm 126. Receiving RN made awre. Will continue to monitor.       ?

## 2021-07-22 NOTE — Plan of Care (Signed)
?  Problem: Education: ?Goal: Expressions of having a comfortable level of knowledge regarding the disease process will increase ?Outcome: Progressing ?  ?Problem: Coping: ?Goal: Level of anxiety will decrease ?Outcome: Progressing ?  ?Problem: Pain Managment: ?Goal: General experience of comfort will improve ?Outcome: Progressing ?  ?Problem: Safety: ?Goal: Ability to remain free from injury will improve ?Outcome: Progressing ?  ?Problem: Pain Management: ?Goal: Satisfaction with pain management regimen will improve ?Outcome: Progressing ?  ?

## 2021-07-22 NOTE — Progress Notes (Signed)
S: Exam unchanged from yesterday. She had multiple episodes overnight concerning for focal seizure. We do not have cEEG capability at [facility] and these were not captured on EEG. ? ?Zada Girt NP and I had a long conversation with family today that we have been unable to identify reversible causes contributing to her progressive encephalopathy and overall neurologic and functional decline. They expressed understanding that further aggressive care will not improve her outcome and wish to proceed with comfort care after a few more family members are able to come say goodbye. ? 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 ? ?O: ? ?Vitals:  ? 07/22/21 1200 07/22/21 1215  ?BP: (!) 114/52 (!) 100/42  ?Pulse: (!) 107 (!) 106  ?Resp: (!) 22 (!) 22  ?Temp:  98.3 ?F (36.8 ?C)  ?SpO2: 97% 97%  ? ?Physical Exam ?Gen: obtunded, briefly flutters eyes in response to vigorous sternal rub ?Resp: slightly increased WOB ?CV: RRR. ?  ?Neuro: ?*MS: obtunded, briefly flutters eyes in response to vigorous sternal rub, does not follow commands ?*Speech: no attempt to speak ?*CN: PERRL, (+) corneals, oculocephalics, gag. Face symmetric at rest. ?*Motor & sensory: no response to noxious stimuli in any extremity ?*Coordination:  UTA ?*Reflexes:  1+ and symmetric throughout without clonus; toes mute bilat ?*Gait: deferred ? ?A/P: Neurology consulted on this patient with known hx seizures, global developmental delay, nonverbal at baseline admitted to ICU with acute on chronic encephalopathy and c/f breakthrough seizures in setting of inconsistent AED administration at facility and recurrent UTI. Hospital stay has been complicated by hypotension and bradycardia, and persistent severe encephalopathy in the absence of any identifiable reversible condition.  ? ?Zada Girt NP and I had a long conversation with family today that we have been unable to identify reversible  causes contributing to her progressive encephalopathy and overall neurologic and functional decline. They expressed understanding that further aggressive care will not improve her outcome and wish to proceed with comfort care after a few more family members are able to come say goodbye. ?  ?Recommendations  ? ?- Family wishes to proceed with comfort care after a few more family members are able to come and say goodbye ?- AEDs are palliative, continue current regimen when patient transitioned to comfort care ?Vimpat 50mg  bid ?Keppra 1500mg  bid ?Phenobarb 32.4mg  tid ?Phenytoin 100mg  bid (2/2 supratherapeutic level on admission) ? ?Neurology to sign off, but please re-engage if additional neurologic concerns arise. ? ?This patient is critically ill and at significant risk of neurological worsening, death and care requires constant monitoring of vital signs, hemodynamics,respiratory and cardiac monitoring, neurological assessment, discussion with family, other specialists and medical decision making of high complexity. I spent 65 minutes of neurocritical care time  in the care of  this patient. This was time spent independent of any time provided by nurse practitioner or PA. ? ? ? , MD ?[facility] ?[phone] ? ?If 7pm- 7am, please page neurology on call as listed in AMION. ? ? ?

## 2021-07-22 NOTE — Progress Notes (Signed)
?  Interdisciplinary Goals of Care Family Meeting ? ? ?Date carried out: 07/22/2021 ? ?Location of the meeting: Conference room ? ?Member's involved: Physician, Nurse Practitioner, and Family Member or next of kin ? ?Durable Power of Attorney or acting medical decision maker: Mariah Jimenez  ? ?Discussion: We discussed goals of care for Mariah Jimenez .  Neurosurgery discussed inability to identify reversible causes contributing to pt encephalopathy and neurological/functional decline despite treatment plan and multiple diagnostic studies. Following discussions pts sister Mariah Jimenez decided to transition pt to full comfort care. ? ?Code status: Full DNR ? ?Disposition: In-patient comfort care ? ?Time spent for the meeting: 20 minutes  ? ? ?Mariah Jimenez, AGNP  ?Pulmonary/Critical Care ?Pager [phone] (please enter 7 digits) ?PCCM Consult Pager [phone] (please enter 7 digits)  ? ? ?

## 2021-07-22 NOTE — Progress Notes (Signed)
?   07/22/21 1345  ?Clinical Encounter Type  ?Visited With Patient and family together  ?Visit Type Initial;Spiritual support;Social support;Critical Care  ?Referral From Nurse  ?Consult/Referral To Chaplain  ?Spiritual Encounters  ?Spiritual Needs Prayer;Emotional  ? ?Mariah Jimenez attended the unit to offer support to the family as they anticipate next steps in care (removal of pressors when family is ready). Chaplain [name] checked-in with [name], RN to understand medical status and current POC. [name] greeted Pt and family who requested prayer. Chaplain B gathered the famiy for prayer and offered her continued support as needed, as well as advising them that a chaplain would be available for support throughout the night ([place], 4/12). ? ?Will continue to f/u or page as needed. ?

## 2021-07-22 NOTE — Progress Notes (Signed)
PHARMACY CONSULT NOTE - FOLLOW UP ? ?Pharmacy Consult for Electrolyte Monitoring and Replacement  ? ?Recent Labs: ?Potassium (mmol/L)  ?Date Value  ?07/22/2021 4.3  ? ?Magnesium (mg/dL)  ?Date Value  ?07/22/2021 1.9  ? ?Calcium (mg/dL)  ?Date Value  ?07/22/2021 8.5 (L)  ? ?Albumin (g/dL)  ?Date Value  ?07/20/2021 2.8 (L)  ? ?Phosphorus (mg/dL)  ?Date Value  ?07/22/2021 2.6  ? ?Sodium (mmol/L)  ?Date Value  ?07/22/2021 145  ? ?Corrected Calcium: 9.56 mg/dl ? ?Assessment: ?76 year old female presenting with seizures. PMH epilepsy, COPD, intellectual disability. Admitted to ICU with shock 2/2 suspected hypovolemia vs myxedema coma vs sepsis. Per RN, patient is verbal at baseline, bed-bound, and eats/drinks independently. Pharmacy consulted to manage electrolytes. ? ?Patient currently NPO. On LR at 75 ml/hr.Rate reduced from 125 ml/hr on 4/10. ? ?Goal of Therapy:  ?Electrolytes within normal limits ? ?Plan:  ?No replacement warranted 4/12. Monitor Magnesium, phosphorus. ?Follow up labs in AM ? ? ?Jaynie Bream, PharmD ?Pharmacy Resident  ?07/22/2021 ?8:13 AM ? ?

## 2021-07-22 NOTE — Progress Notes (Signed)
? ?NAME:  Mariah Jimenez, MRN:  [id number], DOB:  04/20/1945, LOS: 2 ?ADMISSION DATE:  07/19/2021, CONSULTATION DATE:   ?REFERRING MD:  Dr. Carter Kitten, CHIEF COMPLAINT:  AMS  ? ?History of Present Illness:  ?76 yo F presenting to [facility] ED from [facility] via EMS on 07/19/21 with complaints of AMS. Patient's niece is bedside providing patient's history. Per niece, the patient is able to talk at baseline but at times is difficult to understand. She is able to sit up and feed herself as well. She is bed bound but has been assisted with equipment out of bed recently. Per her niece family noticed a change around 3/31-4/2 when the patient became extremely lethargic and non interactive. She was then hospitalized on 07/14/21-07/17/21 with seizure activity at her SNF and subsequently discovered UTI, this resolved with admission and Cefdinir completed on Sat 4/8. ? ?After her recent discharge, patient remained more lethargic then baseline and it is unclear how reliably she has been getting her PO medications, including seizure medications. Per SNF staff they have been crushing them in applesauce and placing them under her tongue. Per the niece, facility staff has alluded to "trying multiple times to give meds". Visible white residue noted on the patient's bottom lip- possibly from recent medication attempts. EMS was called due to family's concerns of extreme lethargy. No complaints of recent seizure activity after recent discharge. There was previous documentation that the patient's tongue/lip might be swollen-clarified with niece, [name], that this is not the case and her tongue/bottom lip are at baseline. While in the room patient began to have what appeared to be a partial localized seizure activity involving rhythmic tremors in her face. Niece, [name], confirmed that this is how her aunt's seizures present, ending with a state appearing like deep sleep with snoring. Ativan 2 mg x 2 given with cessation of seizure like  activity. Bolus of Keppra IV ordered to catch patient up on missed AED.  ? ? ?07/22/21- patient spontaneously opening eyes and making incomprehensible language remains on low dose vasopressor support. Palliative working with family on [place]. Family is asking for no feeding tube , there is plan for comfort care measures today. ? ?Pertinent  Medical History  ?Intellectual Disability ?Seizure ?HTN ?HLD ? ?Significant Hospital Events: ?Including procedures, antibiotic start and stop dates in addition to other pertinent events   ?07/20/21: Admit to ICU with hypotension s/t suspected hypovolemia vs myxedema coma on peripheral vasopressor support. ?07/21/21- patient remains critically ill unresponsive to sternal rub. S/P evalauation with neurology and plan for repeat EEG possible transfer for cEEG ?Objective   ?Blood pressure (!) 132/55, pulse (!) 112, temperature 98.5 ?F (36.9 ?C), temperature source Oral, resp. rate (!) 22, weight 113 kg, SpO2 97 %. ?   ?   ? ?Intake/Output Summary (Last 24 hours) at 07/22/2021 0839 ?Last data filed at 07/22/2021 0500 ?Gross per 24 hour  ?Intake 2561.2 ml  ?Output 700 ml  ?Net 1861.2 ml  ? ? ?Filed Weights  ? 07/20/21 0151 07/21/21 0500 07/22/21 0500  ?Weight: 113.3 kg 113.1 kg 113 kg  ? ? ?Examination: ?General: Adult female, acutely ill, lying in bed, NAD, morbidly obese ?HEENT: MM pink/dry, anicteric, atraumatic, neck supple ?Neuro: non verbal, unable to follow commands, eye opening spont ?PERRL +3, MAE- generalized weakness ?CV: s1s2 RRR, SB with 1st degree HB on monitor, no r/m/g ?Pulm: Regular, non labored on RA, breath sounds clear/diminished throughout ?GI: soft, rounded, non tender, bs x 4 ?GU: pure wick in  place with clear yellow urine , +Fem TLC ?Skin: no rashes/lesions noted ?Extremities: cool/dry, pulses + 2 R/P, no edema noted ? ?Resolved Hospital Problem list   ? ? ?Assessment & Plan:  ? ?Sepsis due to UTI  ?   Present on admision - UA with many bacteria ?   - required  vasopressor support and abx on arrival  ?Patient received 2 L IVF with improvement in BP, no leukocytosis or lactic acidosis to suggest septic shock ?- continue LR infusion ?- peripheral levophed PRN to maintain MAP > 65 ? ?Acute on chronic seizure disorder with possible status epilepticus ?Acute Encephalopathy unclear etiology ?PMHx: seizure disorder, intellectual disability ?CTH negative for acute abnormality ?-neuro on case appreciate refining of therapy ?- Vimpat & Phenobarbital ?- Dilantin dose converted to 100 mg IV Q 8 from ER PO dosage ?- MRI  ?- Ativan IV PRN for seizure activity ? ? ?Possible hypothyroidism ?S/p synthroid ?Recent TSH elevated acutely 8.006 ?- STAT thyroid panel, cortisol ?- solu-cortef x 1 ordered, consider additional dosing and synthroid PRN depending on lab results ?- apply bear hugger PRN ? ?Bilateral Mastoid Effusions ?Recent UTI completed antibiotic course of Cefdinir ?UA: +lrg leuks, pyuria, > 50 WBC ?- rocephin IV 2 g, 5 day course ?- f/u & trend PCT, daily CBC, monitor WBC & fever curve ?- consider ENT consult  ? ?Elevated Alk Phos ?- appears to be baseline in the 200's, previous CT abd/pelvis & [place] have ruled out acute issue. AST, ALT normal range ?Best Practice (right click and "Reselect all SmartList Selections" daily)  ?Diet/type: NPO ?DVT prophylaxis: LMWH ?GI prophylaxis: PPI ?Lines: N/A ?Foley:  N/A ?Code Status:  DNR ?Last date of multidisciplinary goals of care discussion [07/20/21] ? ?Confirmed DNR/DNI with niece, [name], bedside. EDP confirmed with sister [name] prior to admission. Palliative care follows patient at SNF, will consult to assist in ongoing treatment plan discussions. ? ?Labs   ?CBC: ?Recent Labs  ?Lab 07/17/21 ?1309 07/19/21 ?1738 07/20/21 ?0213 07/21/21 ?0400 07/22/21 ?7482  ?WBC 4.8 4.0 2.7* 7.4 7.8  ?NEUTROABS  --  1.3*  --   --   --   ?HGB 10.2* 11.3* 10.3* 9.6* 9.6*  ?HCT 32.0* 35.9* 33.9* 31.0* 30.7*  ?MCV 89.1 90.0 89.7 90.4 90.3  ?PLT 273 296 229 275  191  ? ? ? ?Basic Metabolic Panel: ?Recent Labs  ?Lab 07/17/21 ?1309 07/19/21 ?1738 07/20/21 ?0213 07/21/21 ?0400 07/21/21 ?0410 07/22/21 ?7078  ?NA 142 143 143 145  --  145  ?K 5.1 5.1 4.6 4.8  --  4.3  ?CL 109 109 110 113*  --  111  ?CO2 _0 --  27  ?GLUCOSE 140* 93 105* 125*  --  122*  ?BUN _1 --  11  ?CREATININE 0.95 0.74 0.62 0.79  --  0.67  ?CALCIUM 8.7* 9.2 8.8* 8.6*  --  8.5*  ?MG  --   --  2.0 1.9  --  1.9  ?PHOS  --   --  2.7  --  2.7 2.6  ? ? ?GFR: ?Estimated Creatinine Clearance: 81.3 mL/min (by C-G formula based on SCr of 0.67 mg/dL). ?Recent Labs  ?Lab 07/19/21 ?1738 07/20/21 ?0212 07/20/21 ?6754 07/20/21 ?1058 07/20/21 ?1426 07/21/21 ?0400 07/22/21 ?4920  ?PROCALCITON <0.10 <0.10  --   --   --  0.14  --   ?WBC 4.0  --  2.7*  --   --  7.4 7.8  ?LATICACIDVEN 0.8  --   --  1.4 1.1  --   --   ? ? ? ?Liver Function Tests: ?Recent Labs  ?Lab 07/17/21 ?1309 07/19/21 ?1738 07/20/21 ?0213  ?AST _0 ?ALT _1 ?ALKPHOS 184* 217* 202*  ?BILITOT 0.4 0.6 0.3  ?PROT 6.9 8.0 7.5  ?ALBUMIN 2.5* 3.1* 2.8*  ? ? ?No results for input(s): LIPASE, AMYLASE in the last 168 hours. ?Recent Labs  ?Lab 07/16/21 ?2230 07/21/21 ?1154  ?AMMONIA 35 18  ? ? ? ?ABG ?   ?Component Value Date/Time  ? HCO3 32.0 (H) 07/20/2021 3010  ? O2SAT 92.5 07/20/2021 0212  ? ?  ? ?Coagulation Profile: ?No results for input(s): INR, PROTIME in the last 168 hours. ? ?Cardiac Enzymes: ?No results for input(s): CKTOTAL, CKMB, CKMBINDEX, TROPONINI in the last 168 hours. ? ?HbA1C: ?Hgb A1c MFr Bld  ?Date/Time Value Ref Range Status  ?09/18/2020 02:24 PM 5.4 4.8 - 5.6 % Final  ?  Comment:  ?  (NOTE) ?        Prediabetes: 5.7 - 6.4 ?        Diabetes: >6.4 ?        Glycemic control for adults with diabetes: <7.0 ?  ?04/09/2016 01:33 AM 5.2 4.8 - 5.6 % Final  ?  Comment:  ?  (NOTE) ?        Pre-diabetes: 5.7 - 6.4 ?        Diabetes: >6.4 ?        Glycemic control for adults with diabetes: <7.0 ?  ? ? ?CBG: ?Recent Labs  ?Lab  07/21/21 ?1619 07/21/21 ?1958 07/21/21 ?2345 07/22/21 ?0357 07/22/21 ?0709  ?GLUCAP 98 109* 107* 107* 129*  ? ? ? ?Review of Systems:   ?UTA- patient non verbal at baseline ? ?Past Medical History:  ?She,  has a past medi

## 2021-07-22 NOTE — Progress Notes (Signed)
Patient observed with facial twitching to her eyes and lips. Family stated this is what the patient do during her seizures. PRN Ativan administered as ordered. Neurology aware. ?

## 2021-07-23 DIAGNOSIS — Z515 Encounter for palliative care: Secondary | ICD-10-CM

## 2021-07-23 DIAGNOSIS — Z7189 Other specified counseling: Secondary | ICD-10-CM | POA: Diagnosis not present

## 2021-07-23 DIAGNOSIS — R401 Stupor: Secondary | ICD-10-CM | POA: Diagnosis not present

## 2021-07-23 NOTE — Progress Notes (Addendum)
? ?                                                                                                                                                     ?                                                   ?Daily Progress Note  ? ?Patient Name: Mariah Jimenez       Date: 07/23/2021 ?DOB: Nov 20, 1945  Age: 76 y.o. MRN#: [id number] ?Attending Physician: Ottie Glazier, MD ?Primary Care Physician: Alvester Morin, MD ?Admit Date: 07/19/2021 ? ?Reason for Consultation/Follow-up: Establishing goals of care and Terminal Care ? ?Subjective: ?Notes reviewed. Spoke with neurology. In to see patient, no distress noted. Patient's sister and niece are at bedside. She discusses decision for comfort care and are at peace with this. Discussed care moving forward. The family tells me with the amount of IV antiepileptics she is on, they are worried if those are stopped, "she would have seizure after seizure" and do not want her to suffer. They understand seizures can be treated, but want them prevented. They would like for her to remain here for now and discuss thoughts on moving to hospice facility.  ? ?ADDENDUM: Patient's family state they would like to try to go to hospice facility if patient can be moved. Spoke with neurology who does not recommend transitioning from antiepileptics to a Versed or Ativan infusion as she would experience seizures. Spoke with Artist Pais who states she will try to assist with getting this patient to the hospice facility if at all possible and understands patient will need to stay on all current IV antiepileptic agents. Epic chat sent to team.   ? ?Length of Stay: 3 ? ?Current Medications: ?Scheduled Meds:  ? mouth rinse  15 mL Mouth Rinse q12n4p  ? PHENObarbital  32.4 mg Intravenous TID  ? ? ?Continuous Infusions: ? sodium chloride 10 mL/hr at 07/22/21 1600  ? lacosamide (VIMPAT) IV 50 mg (07/23/21 0921)  ? levETIRAcetam 1,500 mg (07/23/21 0840)  ? phenytoin (DILANTIN) IVPB 100 mg  (07/22/21 2348)  ? ? ?PRN Meds: ?sodium chloride, albuterol, glycopyrrolate, lip balm, LORazepam, morphine injection ? ?Physical Exam ?Constitutional:   ?   Comments: Eyes closed.   ?Pulmonary:  ?   Effort: Pulmonary effort is normal.  ?         ? ?Vital Signs: BP (!) 109/49 (BP Location: Right Arm)   Pulse 100   Temp 98.3 ?F (36.8 ?C) (Axillary)   Resp 16   Ht 5\' 8"  (1.727 m)   Wt 113 kg   SpO2 99%   BMI 37.88 kg/m?  ?SpO2: SpO2: 99 % ?  O2 Device: O2 Device: Room Air ?O2 Flow Rate:   ? ?Intake/output summary:  ?Intake/Output Summary (Last 24 hours) at 07/23/2021 1113 ?Last data filed at 07/23/2021 0815 ?Gross per 24 hour  ?Intake 784.26 ml  ?Output 1300 ml  ?Net -515.74 ml  ? ?LBM: Last BM Date : 07/22/21 ?Baseline Weight: Weight: 113.3 kg ?Most recent weight: Weight: 113 kg ? ? ? ?Patient Active Problem List  ? Diagnosis Date Noted  ? Hypotension   ? Altered mental status 07/20/2021  ? Acute metabolic encephalopathy [id number]  ? Acute pyelonephritis 11/13/2020  ? Acute urinary retention 11/13/2020  ? Acute repetitive seizure ([place]) 11/13/2020  ? Seizure ([place]) 11/13/2020  ? 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 Gastric polyp   ? Class 3 obesity ([place]) 10/07/2019  ? Sinus tachycardia 10/05/2019  ? Pressure injury of skin 10/05/2019  ? Dehydration 10/05/2019  ? Intractable nausea and vomiting 10/04/2019  ? Intellectual disability 10/04/2019  ? Elevated lipase 10/04/2019  ? Seizure disorder ([place]) 08/18/2019  ? Lower urinary tract infectious disease 08/18/2019  ? Hypothermia 08/18/2019  ? Acute respiratory failure with hypoxia ([place]) 08/18/2019  ? Palliative care encounter 10/06/2018  ? Debility 10/06/2018  ? Tibia/fibula fracture 04/09/2016  ? ? ?Palliative Care Assessment & Plan  ? ? ? ?Recommendations/Plan: ?Currently comfort care. Family is worried about going to hospice as patient is on multiple IV medications to try to prevent seizures.  ? ?ADDENDUM: Patient's family state they would like to try to go to  hospice facility if patient can be moved. Spoke with neurology who does not recommend transitioning from antiepileptics to a Versed or Ativan infusion as she would experience seizures. Spoke with Artist Pais who states she will try to assist with getting this patient to the hospice facility if at all possible and understands patient will need to stay on all current IV antiepileptic agents. Epic chat sent to team.   ? ? ?Code Status: ? ?  ?Code Status Orders  ?(From admission, onward)  ?  ? ? ?  ? ?  Start     Ordered  ? 07/22/21 1437  Do not attempt resuscitation (DNR)  Continuous       ?Question Answer Comment  ?In the event of cardiac or respiratory ARREST Do not call a ?code blue?   ?In the event of cardiac or respiratory ARREST Do not perform Intubation, CPR, defibrillation or ACLS   ?In the event of cardiac or respiratory ARREST Use medication by any route, position, wound care, and other measures to relive pain and suffering. May use oxygen, suction and manual treatment of airway obstruction as needed for comfort.   ?Comments CODE STATUS was discussed with patient's wife over the phone and she is a DNR.   ?  ? 07/22/21 1437  ? ?  ?  ? ?  ? ?Code Status History   ? ? Date Active Date Inactive Code Status Order ID Comments User Context  ? 07/20/2021 0003 07/22/2021 1437 DNR [id number]  Rust-Chester, Huel Cote, NP ED  ? 07/14/2021 1825 07/17/2021 2058 DNR [id number]  Clance Boll, MD ED  ? 11/13/2020 1045 11/24/2020 2024 DNR [id number]  Collier Bullock, MD ED  ? 11/13/2020 0356 11/13/2020 1045 Full Code [id number]  Athena Masse, MD ED  ? 10/01/2020 1224 10/01/2020 2036 DNR [id number]  Merlyn Lot, MD ED  ? 09/18/2020 1515 09/22/2020 0110 DNR [id number]  Lucrezia Starch, MD ED  ? 08/12/2020 1731 08/14/2020 1758 DNR [id number]  Bonnell Public  Tublu, MD ED  ? 10/04/2019 2027 10/16/2019 2043 DNR [id number]  Athena Masse, MD ED  ? 08/18/2019 1656 08/21/2019 1641 Partial Code [id number]  Ivor Costa, MD ED  ? 04/09/2016 0310  04/15/2016 2116 Full Code [id number]  Harrie Foreman, MD ED  ? ?  ? ?Advance Directive Documentation   ? ?Flowsheet Row Most Recent Value  ?Type of Advance Directive Out of facility DNR (pink MOST or yellow form)  ?Pre-existing out of facility DNR order (yellow form or pink MOST form) Physician notified to receive inpatient order  ?"MOST" Form in Place? --  ? ?  ? ? ?Thank you for allowing the Palliative Medicine Team to assist in the care of this patient. ? ? ? ?Asencion Gowda, NP ? ?Please contact Palliative Medicine Team phone at [phone] for questions and concerns.  ? ? ? ? ? ?

## 2021-07-23 NOTE — Progress Notes (Addendum)
? ?NAME:  Mariah Jimenez, MRN:  [id number], DOB:  1946/01/29, LOS: 3 ?ADMISSION DATE:  07/19/2021, CONSULTATION DATE:   ?REFERRING MD:  Dr. Carter Kitten, CHIEF COMPLAINT:  AMS  ? ?History of Present Illness:  ?76 yo F presenting to [facility] ED from [facility] SNF via EMS on 07/19/21 with complaints of AMS. Patient's niece is bedside providing patient's history. Per niece, the patient is able to talk at baseline but at times is difficult to understand. She is able to sit up and feed herself as well. She is bed bound but has been assisted with equipment out of bed recently. Per her niece family noticed a change around 3/31-4/2 when the patient became extremely lethargic and non interactive. She was then hospitalized on 07/14/21-07/17/21 with seizure activity at her SNF and subsequently discovered UTI, this resolved with admission and Cefdinir completed on Sat 4/8. ? ?After her recent discharge, patient remained more lethargic then baseline and it is unclear how reliably she has been getting her PO medications, including seizure medications. Per SNF staff they have been crushing them in applesauce and placing them under her tongue. Per the niece, facility staff has alluded to "trying multiple times to give meds". Visible white residue noted on the patient's bottom lip- possibly from recent medication attempts. EMS was called due to family's concerns of extreme lethargy. No complaints of recent seizure activity after recent discharge. There was previous documentation that the patient's tongue/lip might be swollen-clarified with niece, [name], that this is not the case and her tongue/bottom lip are at baseline. While in the room patient began to have what appeared to be a partial localized seizure activity involving rhythmic tremors in her face. Niece, [name], confirmed that this is how her aunt's seizures present, ending with a state appearing like deep sleep with snoring. Ativan 2 mg x 2 given with cessation of seizure like  activity. Bolus of Keppra IV ordered to catch patient up on missed AED.  ? ? ?07/22/21- patient spontaneously opening eyes and making incomprehensible language remains on low dose vasopressor support. Palliative working with family on [place]. Family is asking for no feeding tube , there is plan for comfort care measures today. ? ?07/23/21-  patient is optimized for transfer to [facility].  On comfort care protocol.  ? ?Pertinent  Medical History  ?Intellectual Disability ?Seizure ?HTN ?HLD ? ?Significant Hospital Events: ?Including procedures, antibiotic start and stop dates in addition to other pertinent events   ?07/20/21: Admit to ICU with hypotension s/t suspected hypovolemia vs myxedema coma on peripheral vasopressor support. ?07/21/21- patient remains critically ill unresponsive to sternal rub. S/P evalauation with neurology and plan for repeat EEG possible transfer for cEEG ?Objective   ?Blood pressure (!) 109/49, pulse 100, temperature 98.3 ?F (36.8 ?C), temperature source Axillary, resp. rate 16, height $RemoveBe'5\' 8"'ndwrlnxYO$  (1.727 m), weight 113 kg, SpO2 99 %. ?   ?   ? ?Intake/Output Summary (Last 24 hours) at 07/23/2021 0904 ?Last data filed at 07/23/2021 0815 ?Gross per 24 hour  ?Intake 784.26 ml  ?Output 1300 ml  ?Net -515.74 ml  ? ? ?Filed Weights  ? 07/20/21 0151 07/21/21 0500 07/22/21 0500  ?Weight: 113.3 kg 113.1 kg 113 kg  ? ? ?Examination: ?General: Adult female, chronically ill, lying in bed, NAD, morbidly obese ?HEENT: MM pink/dry, anicteric, atraumatic, neck supple ?Neuro: non verbal, unable to follow commands, eye opening spont ?PERRL +3, MAE- generalized weakness ?CV: s1s2 RRR, SB with 1st degree HB on monitor, no r/m/g ?Pulm: Regular, non  labored on RA, breath sounds clear/diminished throughout ?GI: soft, rounded, non tender, bs x 4 ?GU: pure wick in place with clear yellow urine , +Fem TLC ?Skin: no rashes/lesions noted ?Extremities: cool/dry, pulses + 2 R/P, no edema noted ? ? ?Assessment & Plan:  ? ?Sepsis due to UTI  -PATIENT NOW ON COMFORT CARE MEASURES ?   Present on admision - UA with many bacteria ?   - required vasopressor support and abx on arrival  ?Patient received 2 L IVF with improvement in BP, no leukocytosis or lactic acidosis to suggest septic shock ?- continue LR infusion ?- peripheral levophed PRN to maintain MAP > 65 ? ?Acute on chronic seizure disorder with possible status epilepticus ?Acute Encephalopathy unclear etiology ?PMHx: seizure disorder, intellectual disability ?CTH negative for acute abnormality ?-neuro on case appreciate refining of therapy ?-PATIENT NOW ON COMFORT MEASURES ? ? ?Possible hypothyroidism ?S/p synthroid ?Recent TSH elevated acutely 8.006 ?- PATIENT NOW ON COMFORT MEASURES ? ?Bilateral Mastoid Effusions ?Recent UTI completed antibiotic course of Cefdinir ?-COMFORT MEASURES ? ?Elevated Alk Phos ?- appears to be baseline in the 200's, previous CT abd/pelvis & [place] have ruled out acute issue. AST, ALT normal range ?Best Practice (right click and "Reselect all SmartList Selections" daily)  ?Diet/type: NPO ?DVT prophylaxis: LMWH ?GI prophylaxis: PPI ?Lines: N/A ?Foley:  N/A ?Code Status:  DNR ?Last date of multidisciplinary goals of care discussion [07/20/21] ? ? ? ?Labs   ?CBC: ?Recent Labs  ?Lab 07/17/21 ?1309 07/19/21 ?1738 07/20/21 ?0213 07/21/21 ?0400 07/22/21 ?9407  ?WBC 4.8 4.0 2.7* 7.4 7.8  ?NEUTROABS  --  1.3*  --   --   --   ?HGB 10.2* 11.3* 10.3* 9.6* 9.6*  ?HCT 32.0* 35.9* 33.9* 31.0* 30.7*  ?MCV 89.1 90.0 89.7 90.4 90.3  ?PLT 273 296 229 275 191  ? ? ? ?Basic Metabolic Panel: ?Recent Labs  ?Lab 07/17/21 ?1309 07/19/21 ?1738 07/20/21 ?0213 07/21/21 ?0400 07/21/21 ?0410 07/22/21 ?6808  ?NA 142 143 143 145  --  145  ?K 5.1 5.1 4.6 4.8  --  4.3  ?CL 109 109 110 113*  --  111  ?CO2 $Rem'24 27 28 25  'yWHS$ --  27  ?GLUCOSE 140* 93 105* 125*  --  122*  ?BUN $Rem'19 17 15 14  'nTzA$ --  11  ?CREATININE 0.95 0.74 0.62 0.79  --  0.67  ?CALCIUM 8.7* 9.2 8.8* 8.6*  --  8.5*  ?MG  --   --  2.0 1.9  --  1.9  ?PHOS  --    --  2.7  --  2.7 2.6  ? ? ?GFR: ?Estimated Creatinine Clearance: 81.3 mL/min (by C-G formula based on SCr of 0.67 mg/dL). ?Recent Labs  ?Lab 07/19/21 ?1738 07/20/21 ?0212 07/20/21 ?8110 07/20/21 ?1058 07/20/21 ?1426 07/21/21 ?0400 07/22/21 ?3159  ?PROCALCITON <0.10 <0.10  --   --   --  0.14  --   ?WBC 4.0  --  2.7*  --   --  7.4 7.8  ?LATICACIDVEN 0.8  --   --  1.4 1.1  --   --   ? ? ? ?Liver Function Tests: ?Recent Labs  ?Lab 07/17/21 ?1309 07/19/21 ?1738 07/20/21 ?0213  ?AST $Rem'28 31 26  'qvYV$ ?ALT $Re'17 22 23  'BQw$ ?ALKPHOS 184* 217* 202*  ?BILITOT 0.4 0.6 0.3  ?PROT 6.9 8.0 7.5  ?ALBUMIN 2.5* 3.1* 2.8*  ? ? ?No results for input(s): LIPASE, AMYLASE in the last 168 hours. ?Recent Labs  ?Lab 07/16/21 ?2230 07/21/21 ?1154  ?AMMONIA  35 18  ? ? ? ?ABG ?   ?Component Value Date/Time  ? HCO3 32.0 (H) 07/20/2021 6168  ? O2SAT 92.5 07/20/2021 0212  ? ?  ? ?Coagulation Profile: ?No results for input(s): INR, PROTIME in the last 168 hours. ? ?Cardiac Enzymes: ?No results for input(s): CKTOTAL, CKMB, CKMBINDEX, TROPONINI in the last 168 hours. ? ?HbA1C: ?Hgb A1c MFr Bld  ?Date/Time Value Ref Range Status  ?09/18/2020 02:24 PM 5.4 4.8 - 5.6 % Final  ?  Comment:  ?  (NOTE) ?        Prediabetes: 5.7 - 6.4 ?        Diabetes: >6.4 ?        Glycemic control for adults with diabetes: <7.0 ?  ?04/09/2016 01:33 AM 5.2 4.8 - 5.6 % Final  ?  Comment:  ?  (NOTE) ?        Pre-diabetes: 5.7 - 6.4 ?        Diabetes: >6.4 ?        Glycemic control for adults with diabetes: <7.0 ?  ? ? ?CBG: ?Recent Labs  ?Lab 07/21/21 ?1958 07/21/21 ?2345 07/22/21 ?0357 07/22/21 ?0709 07/22/21 ?1143  ?GLUCAP 109* 107* 107* 129* 112*  ? ? ? ?Review of Systems:   ?UTA- patient non verbal at baseline ? ?Past Medical History:  ?She,  has a past medical history of Mental retardation and Seizures ([place]).  ? ?Surgical History:  ? ?Past Surgical History:  ?Procedure Laterality Date  ? ESOPHAGOGASTRODUODENOSCOPY (EGD) WITH PROPOFOL N/A 10/10/2019  ? Procedure:  ESOPHAGOGASTRODUODENOSCOPY (EGD) WITH PROPOFOL;  Surgeon: Virgel Manifold, MD;  Location: [facility] ENDOSCOPY;  Service: Endoscopy;  Laterality: N/A;  ? NO PAST SURGERIES    ? TIBIA IM NAIL INSERTION Right 04/09/2016  ? Procedu

## 2021-07-23 NOTE — Care Management Important Message (Signed)
Important Message ? ?Patient Details  ?Name: Mariah Jimenez ?MRN: [id number] ?Date of Birth: October 06, 1945 ? ? ?Medicare Important Message Given:  Yes ? ?Patient is on Comfort Care and out of the respect for the patient and family no Important Message from [facility] given. ? ? ?Olegario Messier A Katlyn Muldrew ?07/23/2021, 8:26 AM ?

## 2021-07-23 NOTE — Progress Notes (Signed)
Civil engineer, contracting [facility]) Hospital Liaison Note ?  ?Received request from PMT provider/Crystal G. for family interest in Hospice Home. Visited patient at bedside and spoke with ssister: Annamarie Major: [phone] to confirm interest and explain services. ?  ?Approval for Hospice Home is determined by [facility]. Once [facility] MD has determined Hospice Home eligibility, [facility] will update hospital staff and family. Hospice eligibility pending ?  ?Please do not hesitate to call with any hospice related questions.  ?  ?Thank you for the opportunity to participate in this patient's care. ?  ?Odette Fraction, MSW ?[facility] Hospital Liaison  ?[phone] ?

## 2021-07-23 NOTE — Progress Notes (Signed)
Nutrition Brief Note  Chart reviewed. Pt now transitioning to comfort care.  No further nutrition interventions planned at this time.  Please re-consult as needed.   Laurieanne Galloway W, RD, LDN, CDCES Registered Dietitian II Certified Diabetes Care and Education Specialist Please refer to AMION for RD and/or RD on-call/weekend/after hours pager   

## 2021-07-24 DIAGNOSIS — R401 Stupor: Secondary | ICD-10-CM | POA: Diagnosis not present

## 2021-07-24 DIAGNOSIS — Z515 Encounter for palliative care: Secondary | ICD-10-CM | POA: Diagnosis not present

## 2021-07-24 DIAGNOSIS — Z7189 Other specified counseling: Secondary | ICD-10-CM | POA: Diagnosis not present

## 2021-07-24 NOTE — Progress Notes (Addendum)
?Progress Note ? ? ?Patient: Mariah Jimenez WNU:[id number] DOB: June 07, 1945 DOA: 07/19/2021     4 ?DOS: the patient was seen and examined on 07/24/2021 ?  ?Brief hospital course: ?ICU transfer 07/24/2021. ? ?76 yo F presenting to [facility] ED from [facility] SN[facility] via EMS on 07/19/21 with complaints of AMS. Patient's niece is bedside providing patient's history. Per niece, the patient is able to talk at baseline but at times is difficult to understand. She is able to sit up and feed herself as well. She is bed bound but has been assisted with equipment out of bed recently. Per her niece family noticed a change around 3/31-4/2 when the patient became extremely lethargic and non interactive. She was then hospitalized on 07/14/21-07/17/21 with seizure activity at her SNF and subsequently discovered UTI, this resolved with admission and Cefdinir completed on Sat 4/8. ?  ?After her recent discharge, patient remained more lethargic then baseline and it is unclear how reliably she has been getting her PO medications, including seizure medications. Per SNF staff they have been crushing them in applesauce and placing them under her tongue. Per the niece, facility staff has alluded to "trying multiple times to give meds". Visible white residue noted on the patient's bottom lip- possibly from recent medication attempts. EMS was called due to family's concerns of extreme lethargy. No complaints of recent seizure activity after recent discharge. There was previous documentation that the patient's tongue/lip might be swollen-clarified with niece, [name], that this is not the case and her tongue/bottom lip are at baseline. While in the room patient began to have what appeared to be a partial localized seizure activity involving rhythmic tremors in her face. Niece, [name], confirmed that this is how her aunt's seizures present, ending with a state appearing like deep sleep with snoring. Ativan 2 mg x 2 given with cessation of seizure like  activity. Bolus of Keppra IV ordered to catch patient up on missed AED.  ? ?She was admitted to ICU with hypotension with suspected hypovolemia versus myxedema coma, requiring vasopressor support.  Mental status remained unchanged and she was most of the time unresponsive except to deep sternal rub. ?Patient continued to have multiple episodes concerning for focal seizures.  No continuous EEG monitoring available at [facility].  Neurology is recommending continuation of Vimpat, Keppra, phenobarb and phenytoin given with comfort care. ?Patient remained encephalopathic with no reversible cause found. ? ?Palliative care was consulted and family decided to move to comfort care only. ?She is being evaluated for inpatient hospice care, apparently needs IV antiepileptics per neurology as she started seizing on stopping those medications. ?  ?  ?Addendum.  She is being approved for inpatient hospice, no bed availability today.  Her medications will be available on Monday.  If remains stable then most likely will be going to hospice home on Monday. ? ?Assessment and Plan: ?* Altered mental status ?Patient admitted with altered mental status, responding only to deep sternal rub and not following any commands.  Concern of recurrent focal seizures and she was evaluated by neurology. ?Concern of UTI and she completed a course of antibiotics. ? ?Due to no change of her mental status over time despite treating the UTI. ?No other reversible cause found.  Palliative care was consulted and family decided to proceed with comfort care only. ? ?Family would like her to go to inpatient hospice care, final approval pending as she continued to require antiepileptic medications. ? ?Comfort care measures initiated. ? ? ?  ? ?Subjective:  Patient opens eyes momentarily on deep sternal rub, 1 for a second and then closes eyes again.  Not following any commands. ? ?Physical Exam: ?Vitals:  ? 07/22/21 1415 07/22/21 1945 07/23/21 1647 07/24/21 0822   ?BP: (!) 102/44 (!) 109/49 118/71 126/75  ?Pulse: (!) 103 100 73 75  ?Resp: 18 16 16 16   ?Temp:  98.3 ?F (36.8 ?C)  (!) 97.3 ?F (36.3 ?C)  ?TempSrc:  Axillary    ?SpO2: 97% 99% 100% 99%  ?Weight:      ?Height:      ? ?General.  Lethargic and chronically ill-appearing elderly lady,    in no acute distress. ?Pulmonary.  Lungs clear bilaterally, normal respiratory effort. ?CV.  Regular rate and rhythm, no JVD, rub or murmur. ?Abdomen.  Soft, nontender, nondistended, BS positive. ?CNS.  Just open eyes on deep sternal rub, not following any commands ?Extremities.  No edema, no cyanosis, pulses intact and symmetrical. ?Psychiatry.  Judgment and insight appears impaired ? ?Data Reviewed: ?Prior notes, labs and images reviewed ? ?Family Communication: Niece at bedside ? ?Disposition: ?Status is: Inpatient ?Remains inpatient appropriate because: Waiting for in patient hospice ? ? Planned Discharge Destination:  Inpatient hospice ? ?Time spent: 45 minutes ? ?This record has been created using . Errors have been sought and corrected,but may not always be located. Such creation errors do not reflect on the standard of care. ? ?Author: ?Conservation officer, historic buildings, MD ?07/24/2021 2:53 PM ? ?For on call review [email or web address].  ?

## 2021-07-24 NOTE — Progress Notes (Signed)
? ?                                                                                                                                                     ?                                                   ?Daily Progress Note  ? ?Patient Name: Mariah Jimenez       Date: 07/24/2021 ?DOB: February 22, 1946  Age: 76 y.o. MRN#: [id number] ?Attending Physician: Lorella Nimrod, MD ?Primary Care Physician: Alvester Morin, MD ?Admit Date: 07/19/2021 ? ?Reason for Consultation/Follow-up: Establishing goals of care and Terminal Care ? ?Subjective: Called by hospice liaison and advised they are still working on attempting to get patient to hospice facility if possible. In to see patient and family. Patient is resting with eyes closed; no distress noted. Sister's daughter is at bedside, who has been present for every meeting. Family is hopeful for hospice facility but understand if this is not an option.   ? ? ?Length of Stay: 4 ? ?Current Medications: ?Scheduled Meds:  ? mouth rinse  15 mL Mouth Rinse q12n4p  ? PHENObarbital  32.4 mg Intravenous TID  ? ? ?Continuous Infusions: ? lacosamide (VIMPAT) IV 50 mg (07/24/21 0933)  ? levETIRAcetam 1,500 mg (07/24/21 0855)  ? phenytoin (DILANTIN) IVPB 100 mg (07/24/21 1029)  ? ? ?PRN Meds: ?albuterol, glycopyrrolate, lip balm, LORazepam, morphine injection ? ?Physical Exam ?Constitutional:   ?   Comments: Eyes closed.   ?Pulmonary:  ?   Effort: Pulmonary effort is normal.  ?         ? ?Vital Signs: BP 126/75 (BP Location: Right Arm)   Pulse 75   Temp (!) 97.3 ?F (36.3 ?C)   Resp 16   Ht 5\' 8"  (1.727 m)   Wt 113 kg   SpO2 99%   BMI 37.88 kg/m?  ?SpO2: SpO2: 99 % ?O2 Device: O2 Device: Room Air ?O2 Flow Rate:   ? ?Intake/output summary:  ?Intake/Output Summary (Last 24 hours) at 07/24/2021 1238 ?Last data filed at 07/23/2021 1712 ?Gross per 24 hour  ?Intake --  ?Output 125 ml  ?Net -125 ml  ? ?LBM: Last BM Date :  (only a smear charted on 4/12) ?Baseline Weight: Weight: 113.3 kg ?Most  recent weight: Weight: 113 kg ? ?     ? ?Patient Active Problem List  ? Diagnosis Date Noted  ? Hypotension   ? Altered mental status 07/20/2021  ? Acute metabolic encephalopathy [id number]  ? Acute pyelonephritis 11/13/2020  ? Acute urinary retention 11/13/2020  ? Acute repetitive seizure ([place]) 11/13/2020  ? Seizure ([place]) 11/13/2020  ? 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 Gastric polyp   ? Class 3 obesity ([place]) 10/07/2019  ? Sinus tachycardia 10/05/2019  ? Pressure injury of skin 10/05/2019  ? Dehydration 10/05/2019  ? Intractable nausea and vomiting 10/04/2019  ? Intellectual disability 10/04/2019  ? Elevated lipase 10/04/2019  ? Seizure disorder ([place]) 08/18/2019  ? Lower urinary tract infectious disease 08/18/2019  ? Hypothermia 08/18/2019  ? Acute respiratory failure with hypoxia ([place]) 08/18/2019  ? Palliative care encounter 10/06/2018  ? Debility 10/06/2018  ? Tibia/fibula fracture 04/09/2016  ? ? ?Palliative Care Assessment & Plan  ? ?Recommendations/Plan: ?No changes recommended to symptom management ?Family is aware they are waiting to ensure candidacy to hospice facility. She will be a candidate if they are able to administer all antiepileptic agents currently in place.  ? ?Code Status: ? ?  ?Code Status Orders  ?(From admission, onward)  ?  ? ? ?  ? ?  Start     Ordered  ? 07/22/21 1437  Do not attempt resuscitation (DNR)  Continuous       ?Question Answer Comment  ?In the event of cardiac or respiratory ARREST Do not call a ?code blue?   ?In the event of cardiac or respiratory ARREST Do not perform Intubation, CPR, defibrillation or ACLS   ?In the event of cardiac or respiratory ARREST Use medication by any route, position, wound care, and other measures to relive pain and suffering. May use oxygen, suction and manual treatment of airway obstruction as needed for comfort.   ?Comments CODE STATUS was discussed with patient's wife over the phone and she is a DNR.   ?  ? 07/22/21 1437  ? ?  ?  ? ?   ? ?Code Status History   ? ? Date Active Date Inactive Code Status Order ID Comments User Context  ? 07/20/2021 0003 07/22/2021 1437 DNR [id number]  Rust-Chester, Huel Cote, NP ED  ? 07/14/2021 1825 07/17/2021 2058 DNR [id number]  Clance Boll, MD ED  ? 11/13/2020 1045 11/24/2020 2024 DNR [id number]  Collier Bullock, MD ED  ? 11/13/2020 0356 11/13/2020 1045 Full Code [id number]  Athena Masse, MD ED  ? 10/01/2020 1224 10/01/2020 2036 DNR [id number]  Merlyn Lot, MD ED  ? 09/18/2020 1515 09/22/2020 0110 DNR [id number]  Lucrezia Starch, MD ED  ? 08/12/2020 1731 08/14/2020 1758 DNR [id number]  Vashti Hey, MD ED  ? 10/04/2019 2027 10/16/2019 2043 DNR [id number]  Athena Masse, MD ED  ? 08/18/2019 1656 08/21/2019 1641 Partial Code [id number]  Ivor Costa, MD ED  ? 04/09/2016 0310 04/15/2016 2116 Full Code [id number]  Harrie Foreman, MD ED  ? ?  ? ?Advance Directive Documentation   ? ?Flowsheet Row Most Recent Value  ?Type of Advance Directive Out of facility DNR (pink MOST or yellow form)  ?Pre-existing out of facility DNR order (yellow form or pink MOST form) Physician notified to receive inpatient order  ?"MOST" Form in Place? --  ? ?  ? ? ?Prognosis: ? < 2 weeks ? ? ? ?Care plan was discussed with hospice liaison.  ? ?Thank you for allowing the Palliative Medicine Team to assist in the care of this patient. ? ? ?Asencion Gowda, NP ? ?Please contact Palliative Medicine Team phone at [phone] for questions and concerns.  ? ? ? ? ? ?

## 2021-07-24 NOTE — Assessment & Plan Note (Signed)
Patient admitted with altered mental status, responding only to deep sternal rub and not following any commands.  Concern of recurrent focal seizures and she was evaluated by neurology. ?Concern of UTI and she completed a course of antibiotics. ? ?Due to no change of her mental status over time despite treating the UTI. ?No other reversible cause found.  Palliative care was consulted and family decided to proceed with comfort care only. ? ?Family would like her to go to inpatient hospice care, final approval pending as she continued to require antiepileptic medications. ? ?Comfort care measures initiated. ? ?

## 2021-07-24 NOTE — Hospital Course (Signed)
ICU transfer 07/24/2021. ? ?76 yo F presenting to [facility] ED from [facility] SN[facility] via EMS on 07/19/21 with complaints of AMS. Patient's niece is bedside providing patient's history. Per niece, the patient is able to talk at baseline but at times is difficult to understand. She is able to sit up and feed herself as well. She is bed bound but has been assisted with equipment out of bed recently. Per her niece family noticed a change around 3/31-4/2 when the patient became extremely lethargic and non interactive. She was then hospitalized on 07/14/21-07/17/21 with seizure activity at her SNF and subsequently discovered UTI, this resolved with admission and Cefdinir completed on Sat 4/8. ?  ?After her recent discharge, patient remained more lethargic then baseline and it is unclear how reliably she has been getting her PO medications, including seizure medications. Per SNF staff they have been crushing them in applesauce and placing them under her tongue. Per the niece, facility staff has alluded to "trying multiple times to give meds". Visible white residue noted on the patient's bottom lip- possibly from recent medication attempts. EMS was called due to family's concerns of extreme lethargy. No complaints of recent seizure activity after recent discharge. There was previous documentation that the patient's tongue/lip might be swollen-clarified with niece, [name], that this is not the case and her tongue/bottom lip are at baseline. While in the room patient began to have what appeared to be a partial localized seizure activity involving rhythmic tremors in her face. Niece, [name], confirmed that this is how her aunt's seizures present, ending with a state appearing like deep sleep with snoring. Ativan 2 mg x 2 given with cessation of seizure like activity. Bolus of Keppra IV ordered to catch patient up on missed AED.  ? ?She was admitted to ICU with hypotension with suspected hypovolemia versus myxedema coma, requiring  vasopressor support.  Mental status remained unchanged and she was most of the time unresponsive except to deep sternal rub. ?Patient continued to have multiple episodes concerning for focal seizures.  No continuous EEG monitoring available at [facility].  Neurology is recommending continuation of Vimpat, Keppra, phenobarb and phenytoin given with comfort care. ?Patient remained encephalopathic with no reversible cause found. ? ?Palliative care was consulted and family decided to move to comfort care only. ?She is being evaluated for inpatient hospice care, apparently needs IV antiepileptics per neurology as she started seizing on stopping those medications. ?  ?  ?

## 2021-07-24 NOTE — TOC Progression Note (Signed)
Transition of Care (TOC) - Progression Note  ? ? ?Patient Details  ?Name: Mariah Jimenez ?MRN: [id number] ?Date of Birth: 06/09/1945 ? ?Transition of Care (TOC) CM/SW Contact  ?Caryn Section, RN ?Phone Number: ?07/24/2021, 12:34 PM ? ?Clinical Narrative:   Patient is approved for [facility]ospice House as per Francesco Runner from [place], however there are no beds and team continues to work on seizure medication regimen.  TOC to follow ? ? ? ?Expected Discharge Plan: Skilled Nursing Facility ?Barriers to Discharge: Continued Medical Work up ? ?Expected Discharge Plan and Services ?Expected Discharge Plan: Skilled Nursing Facility ?  ?Discharge Planning Services: CM Consult ?Post Acute Care Choice: Resumption of Svcs/PTA Provider, Skilled Nursing Facility ?Living arrangements for the past 2 months: Single Family Home ?                ?DME Arranged: N/A ?DME Agency: NA ?  ?  ?  ?HH Arranged: NA ?HH Agency: NA ?  ?  ?  ? ? ?Social Determinants of Health (SDOH) Interventions ?  ? ?Readmission Risk Interventions ?   ? View : No data to display.  ?  ?  ?  ? ? ?

## 2021-07-24 NOTE — Progress Notes (Signed)
[facility] Liaison note: ? ?Follow up note on new referral for [facility] hospice home.  ?Hospice home eligibility has been confirmed. ? ?Hospice home does not have a bed to offer today. ?The required IV antiepileptic medications can be continued at the hospice home and can be ready on Monday. ? ?If a bed becomes available over the weekend it will be held for her until the medication can be in place. ? ?Epic chat to attending MD to advise. Writer also spoke with palliative NP Morton Stall and patient's sister Kolette Vey via telephone. ? ?Hospital Liaison will follow through discharge. ?Please call with any hospice related questions. ?Thank you for the opportunity to be involved in the care of this patient and her family. ? ?Dayna Barker BSN, RN, [facility] ?Hospital Liaison ?Civil engineer, contracting ?[phone] ?

## 2021-07-25 DIAGNOSIS — R401 Stupor: Secondary | ICD-10-CM | POA: Diagnosis not present

## 2021-07-25 DIAGNOSIS — N390 Urinary tract infection, site not specified: Secondary | ICD-10-CM | POA: Diagnosis not present

## 2021-07-25 LAB — CULTURE, BLOOD (ROUTINE X 2): Culture: NO GROWTH

## 2021-07-25 NOTE — Progress Notes (Signed)
?PROGRESS NOTE ? ? ? ?Mariah EhlersBarbara Jagoda  ZOX:[id number] DOB: 1945/10/03 DOA: 07/19/2021 ?PCP: Keane PoliceSlade-Hartman, Venezela, MD  ? ?Brief Narrative:  ?76 yo F [facility] ED from [facility] via EMS on 07/19/21 with complaints of AMS. Patient's niece is bedside providing patient's history. Per niece, the patient is able to talk at baseline but at times is difficult to understand. She is able to sit up and feed herself as well. She is bed bound but has been assisted with equipment out of bed recently. Per her niece family noticed a change around 3/31-4/2 when the patient became extremely lethargic and non interactive. She was then hospitalized on 07/14/21-07/17/21 with seizure activity at her SNF and subsequently discovered UTI, this resolved with admission and Cefdinir completed on Sat 4/8. ?  ?After her recent discharge, patient remained more lethargic then baseline and it is unclear how reliably she has been getting her PO medications, including seizure medications. Per SNF staff they have been crushing them in applesauce and placing them under her tongue. Per the niece, facility staff has alluded to "trying multiple times to give meds". Visible white residue noted on the patient's bottom lip- possibly from recent medication attempts. EMS was called due to family's concerns of extreme lethargy. No complaints of recent seizure activity after recent discharge. There was previous documentation that the patient's tongue/lip might be swollen-clarified with niece, Rodney Boozeasha, that this is not the case and her tongue/bottom lip are at baseline. While in the room patient began to have what appeared to be a partial localized seizure activity involving rhythmic tremors in her face. Niece, Rodney Boozeasha, confirmed that this is how her aunt's seizures present, ending with a state appearing like deep sleep with snoring. Ativan 2 mg x 2 given with cessation of seizure like activity. Bolus of Keppra IV ordered to catch patient up on missed  AED.  ?  ?She was admitted to ICU with hypotension with suspected hypovolemia versus myxedema coma, requiring vasopressor support.  Mental status remained unchanged and she was most of the time unresponsive except to deep sternal rub. ?Patient continued to have multiple episodes concerning for focal seizures.  No continuous [facility] available [facility].  Neurology is recommending continuation of Vimpat, Keppra, phenobarb and phenytoin given with comfort care. ?Patient remained encephalopathic with no reversible cause found. ?  ?Palliative care was consulted and family decided to move to comfort care only. ?She is being evaluated for inpatient hospice care, apparently needs IV antiepileptics per neurology as she started seizing on stopping those medications. ?  ?  ?Addendum.  She is being approved for inpatient hospice, no bed availability today.  Her medications will be available on Monday.  If remains stable then most likely will be going to hospice home on Monday. ? ? ?Assessment & Plan: ?  ?Principal Problem: ?  Altered mental status ?Active Problems: ?  Lower urinary tract infectious disease ?  Hypotension ? ? ?* Altered mental status ?Patient admitted with altered mental status, responding only to deep sternal rub and not following any commands.  Concern of recurrent focal seizures and she was evaluated by neurology. ?Concern of UTI and she completed a course of antibiotics. ?  ?Due to no change of her mental status over time despite treating the UTI. ?No other reversible cause found.  Palliative care was consulted and family decided to proceed with comfort care only. ?  ?Family would like her to go to inpatient hospice care, final approval pending as she continued  to require antiepileptic medications. ? ?Anticipate Monday secondary for difficulty arranging for IV medications for seizure  ?(4 meds) ?  ?Comfort care measures initiated 4/14 ? ?DVT prophylaxis: None:Comfort Care  ?Code Status: DNR ? ?  ?Code  Status Orders  ?(From admission, onward)  ?  ? ? ?  ? ?  Start     Ordered  ? 07/22/21 1437  Do not attempt resuscitation (DNR)  Continuous       ?Question Answer Comment  ?In the event of cardiac or respiratory ARREST Do not call a ?code blue?   ?In the event of cardiac or respiratory ARREST Do not perform Intubation, CPR, defibrillation or ACLS   ?In the event of cardiac or respiratory ARREST Use medication by any route, position, wound care, and other measures to relive pain and suffering. May use oxygen, suction and manual treatment of airway obstruction as needed for comfort.   ?Comments CODE STATUS was discussed with patient's wife over the phone and she is a DNR.   ?  ? 07/22/21 1437  ? ?  ?  ? ?  ? ?Code Status History   ? ? Date Active Date Inactive Code Status Order ID Comments User Context  ? 07/20/2021 0003 07/22/2021 1437 DNR [id number]  Rust-Chester, Cecelia Byars, NP ED  ? 07/14/2021 1825 07/17/2021 2058 DNR [id number]  Lurline Del, MD ED  ? 11/13/2020 1045 11/24/2020 2024 DNR [id number]  Lucile Shutters, MD ED  ? 11/13/2020 0356 11/13/2020 1045 Full Code [id number]  Andris Baumann, MD ED  ? 10/01/2020 1224 10/01/2020 2036 DNR [id number]  Willy Eddy, MD ED  ? 09/18/2020 1515 09/22/2020 0110 DNR [id number]  Gilles Chiquito, MD ED  ? 08/12/2020 1731 08/14/2020 1758 DNR [id number]  Pieter Partridge, MD ED  ? 10/04/2019 2027 10/16/2019 2043 DNR [id number]  Andris Baumann, MD ED  ? 08/18/2019 1656 08/21/2019 1641 Partial Code [id number]  Lorretta Harp, MD ED  ? 04/09/2016 0310 04/15/2016 2116 Full Code [id number]  Arnaldo Natal, MD ED  ? ?  ? ?Advance Directive Documentation   ? ?Flowsheet Row Most Recent Value  ?Type of Advance Directive Out of facility DNR (pink MOST or yellow form)  ?Pre-existing out of facility DNR order (yellow form or pink MOST form) Physician notified to receive inpatient order  ?"MOST" Form in Place? --  ? ?  ? ?Family Communication: AT BEDSIDE TODAY, SISTER AND NIECE  ?Disposition Plan:  PLAN  FOR D/C TO HOSPICE Monday-REQUIRING iv ANTI-EPILEPTIC MEDICATIONS  ?Consults called: None ?Admission status: Inpatient ? ? ?Consultants:  ?PALLIATIVE, NEUROLOGY ? ?Procedures:  ?CT HEAD WO CONTRAST ( ) ? ?Result Date: 07/21/2021 ?CLINICAL DATA:  76 year old female with seizure disorder. Acute on chronic encephalopathy. EXAM: CT HEAD WITHOUT CONTRAST TECHNIQUE: Contiguous axial images were obtained from the base of the skull through the vertex without intravenous contrast. RADIATION DOSE REDUCTION: This exam was performed according to the departmental dose-optimization program which includes automated exposure control, adjustment of the mA and/or kV according to patient size and/or use of iterative reconstruction technique. COMPARISON:  07/19/2021 head CT and earlier. FINDINGS: Brain: No midline shift, ventriculomegaly, mass effect, evidence of mass lesion, intracranial hemorrhage or evidence of cortically based acute infarction. Chronic generalized cerebellar atrophy. Stable better preserved cerebral hemisphere volume. Stable gray-white matter differentiation throughout the brain. No areas of acute or chronic ischemia identified. Vascular: Calcified atherosclerosis at the skull base. No suspicious intracranial vascular hyperdensity. Skull: No acute osseous abnormality identified. Chronic thickening of the  calvarium. Sinuses/Orbits: Ongoing opacification of the bilateral middle ears and mastoids. Negative visible nasopharynx. Comparatively mild paranasal sinus mucosal thickening is stable. Other: Visualized orbits and scalp soft tissues are within normal limits. IMPRESSION: 1. Stable non contrast CT appearance of the brain. 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 2. Ongoing bilateral middle ear and mastoid opacification. Mild paranasal sinus inflammation. Electronically Signed   By: Odessa Fleming M.D.   On: 07/21/2021 11:01  ? ?CT Head Wo Contrast ? ?Result Date: 07/19/2021 ?CLINICAL DATA:  Altered  mental status. EXAM: CT HEAD WITHOUT CONTRAST TECHNIQUE: Contiguous axial images were obtained from the base of the skull through the vertex without intravenous contrast. RADIATION DOSE REDUCTION: This exa

## 2021-07-26 DIAGNOSIS — N390 Urinary tract infection, site not specified: Secondary | ICD-10-CM | POA: Diagnosis not present

## 2021-07-26 DIAGNOSIS — R401 Stupor: Secondary | ICD-10-CM | POA: Diagnosis not present

## 2021-07-26 NOTE — Progress Notes (Signed)
?PROGRESS NOTE ? ? ? ?Mariah Jimenez  WOE:[id number] DOB: 22-Sep-1945 DOA: 07/19/2021 ?PCP: Keane Police, MD  ? ?Brief Narrative:  ? ?AWAITING HOSPICE BED MONDAY ? ?76 yo F presenting to [facility] ED from [facility] SNF via EMS on 07/19/21 with complaints of AMS. Patient's niece is bedside providing patient's history. Per niece, the patient is able to talk at baseline but at times is difficult to understand. She is able to sit up and feed herself as well. She is bed bound but has been assisted with equipment out of bed recently. Per her niece family noticed a change around 3/31-4/2 when the patient became extremely lethargic and non interactive. She was then hospitalized on 07/14/21-07/17/21 with seizure activity at her SNF and subsequently discovered UTI, this resolved with admission and Cefdinir completed on Sat 4/8. ?  ?After her recent discharge, patient remained more lethargic then baseline and it is unclear how reliably she has been getting her PO medications, including seizure medications. Per SNF staff they have been crushing them in applesauce and placing them under her tongue. Per the niece, facility staff has alluded to "trying multiple times to give meds". Visible white residue noted on the patient's bottom lip- possibly from recent medication attempts. EMS was called due to family's concerns of extreme lethargy. No complaints of recent seizure activity after recent discharge. There was previous documentation that the patient's tongue/lip might be swollen-clarified with niece, [name], that this is not the case and her tongue/bottom lip are at baseline. While in the room patient began to have what appeared to be a partial localized seizure activity involving rhythmic tremors in her face. Niece, [name], confirmed that this is how her aunt's seizures present, ending with a state appearing like deep sleep with snoring. Ativan 2 mg x 2 given with cessation of seizure like activity. Bolus of Keppra IV  ordered to catch patient up on missed AED.  ?  ?She was admitted to ICU with hypotension with suspected hypovolemia versus myxedema coma, requiring vasopressor support.  Mental status remained unchanged and she was most of the time unresponsive except to deep sternal rub. ?Patient continued to have multiple episodes concerning for focal seizures.  No continuous EEG monitoring available at [facility].  Neurology is recommending continuation of Vimpat, Keppra, phenobarb and phenytoin given with comfort care. ?Patient remained encephalopathic with no reversible cause found. ?  ?Palliative care was consulted and family decided to move to comfort care only. ?She is being evaluated for inpatient hospice care, apparently needs IV antiepileptics per neurology as she started seizing on stopping those medications. ?  ?  ?Addendum.  She is being approved for inpatient hospice, no bed availability today.  Her medications will be available on Monday.  If remains stable then most likely will be going to hospice home on Monday ? ? ?Assessment & Plan: ?  ?Principal Problem: ?  Altered mental status ?Active Problems: ?  Lower urinary tract infectious disease ?  Hypotension ? ? ?Altered mental status with seizure ?Patient admitted with altered mental status, responding only to deep sternal rub and not following any commands.  Concern of recurrent focal seizures and she was evaluated by neurology. ? ?UTI ?Concern of UTI and she completed a course of antibiotics. ?  ?Due to no change of her mental status over time despite treating the UTI. ?No other reversible cause found.  Palliative care was consulted and family decided to proceed with comfort care only. ?  ?Family would like her to go to  inpatient hospice care, final approval pending as she continued to require antiepileptic medications. ?  ?Anticipate Monday secondary for difficulty arranging for IV medications for seizure  ?(4 meds) ?  ?Comfort care measures initiated 4/14 ?  ?DVT  prophylaxis: None:Comfort Care  ?Code Status: DNR ? ?  ?Code Status Orders  ?(From admission, onward)  ?  ? ? ?  ? ?  Start     Ordered  ? 07/22/21 1437  Do not attempt resuscitation (DNR)  Continuous       ?Question Answer Comment  ?In the event of cardiac or respiratory ARREST Do not call a ?code blue?   ?In the event of cardiac or respiratory ARREST Do not perform Intubation, CPR, defibrillation or ACLS   ?In the event of cardiac or respiratory ARREST Use medication by any route, position, wound care, and other measures to relive pain and suffering. May use oxygen, suction and manual treatment of airway obstruction as needed for comfort.   ?Comments CODE STATUS was discussed with patient's wife over the phone and she is a DNR.   ?  ? 07/22/21 1437  ? ?  ?  ? ?  ? ?Code Status History   ? ? Date Active Date Inactive Code Status Order ID Comments User Context  ? 07/20/2021 0003 07/22/2021 1437 DNR [id number]  Rust-Chester, Cecelia ByarsBritton L, NP ED  ? 07/14/2021 1825 07/17/2021 2058 DNR [id number]  Lurline Delhomas, Sara-Maiz A, MD ED  ? 11/13/2020 1045 11/24/2020 2024 DNR [id number]  Lucile ShuttersAgbata, Tochukwu, MD ED  ? 11/13/2020 0356 11/13/2020 1045 Full Code [id number]  Andris Baumannuncan, Hazel V, MD ED  ? 10/01/2020 1224 10/01/2020 2036 DNR [id number]  Willy Eddyobinson, Patrick, MD ED  ? 09/18/2020 1515 09/22/2020 0110 DNR [id number]  Gilles ChiquitoSmith, Zachary P, MD ED  ? 08/12/2020 1731 08/14/2020 1758 DNR [id number]  Pieter Partridgehatterjee, Srobona Tublu, MD ED  ? 10/04/2019 2027 10/16/2019 2043 DNR [id number]  Andris Baumannuncan, Hazel V, MD ED  ? 08/18/2019 1656 08/21/2019 1641 Partial Code [id number]  Lorretta HarpNiu, Xilin, MD ED  ? 04/09/2016 0310 04/15/2016 2116 Full Code [id number]  Arnaldo Nataliamond, Michael S, MD ED  ? ?  ? ?Advance Directive Documentation   ? ?Flowsheet Row Most Recent Value  ?Type of Advance Directive Out of facility DNR (pink MOST or yellow form)  ?Pre-existing out of facility DNR order (yellow form or pink MOST form) Physician notified to receive inpatient order  ?"MOST" Form in Place? --  ? ?  ? ?Family Communication:  Discussed with family bedsdie Saturday, no changes  ?Disposition Plan:  to hospice Monday for end of life care ?Consults called: None ?Admission status: Inpatient ? ? ?Consultants:  ?Palliative care, neurology ? ?Procedures:  ?CT HEAD WO CONTRAST (5MM) ? ?Result Date: 07/21/2021 ?CLINICAL DATA:  76 year old female with seizure disorder. Acute on chronic encephalopathy. EXAM: CT HEAD WITHOUT CONTRAST TECHNIQUE: Contiguous axial images were obtained from the base of the skull through the vertex without intravenous contrast. RADIATION DOSE REDUCTION: This exam was performed according to the departmental dose-optimization program which includes automated exposure control, adjustment of the mA and/or kV according to patient size and/or use of iterative reconstruction technique. COMPARISON:  07/19/2021 head CT and earlier. FINDINGS: Brain: No midline shift, ventriculomegaly, mass effect, evidence of mass lesion, intracranial hemorrhage or evidence of cortically based acute infarction. Chronic generalized cerebellar atrophy. Stable better preserved cerebral hemisphere volume. Stable gray-white matter differentiation throughout the brain. No areas of acute or chronic ischemia identified. Vascular: Calcified atherosclerosis at the skull base. No suspicious intracranial vascular  hyperdensity. Skull: No acute osseous abnormality identified. Chronic thickening of the calvarium. Sinuses/Orbits: Ongoing opacification of the bilateral middle ears and mastoids. Negative visible nasopharynx. Comparatively mild paranasal sinus mucosal thickening is stable. Other: Visualized orbits and scalp soft tissues are within normal limits. IMPRESSION: 1. Stable non contrast CT appearance of the brain. 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 2. Ongoing bilateral middle ear and mastoid opacification. Mild paranasal sinus inflammation. Electronically Signed   By: Odessa Fleming M.D.   On: 07/21/2021 11:01  ? ?CT Head Wo  Contrast ? ?Result Date: 07/19/2021 ?CLINICAL DATA:  Altered mental status. EXAM: CT HEAD WITHOUT CONTRAST TECHNIQUE: Contiguous axial images were obtained from the base of the skull through the vertex without intravenous cont

## 2021-07-27 DIAGNOSIS — I959 Hypotension, unspecified: Secondary | ICD-10-CM | POA: Diagnosis not present

## 2021-07-27 DIAGNOSIS — Z515 Encounter for palliative care: Secondary | ICD-10-CM | POA: Diagnosis not present

## 2021-07-27 DIAGNOSIS — R569 Unspecified convulsions: Secondary | ICD-10-CM | POA: Diagnosis not present

## 2021-07-27 DIAGNOSIS — E669 Obesity, unspecified: Secondary | ICD-10-CM

## 2021-07-27 DIAGNOSIS — G9341 Metabolic encephalopathy: Secondary | ICD-10-CM | POA: Diagnosis not present

## 2021-07-27 NOTE — Assessment & Plan Note (Signed)
Likely secondary from seizure. ?

## 2021-07-27 NOTE — Progress Notes (Signed)
[facility]) Hospital Liaison Note ?  ?Unfortunately, Hospice Home is not able to offer a room today as patients medications did not arrive today as scheduled. If medicatons arrive, patient will transfer to [place] on 4.18  or sooner. [facility] Manager aware hospital liaison will follow up tomorrow or sooner if a room becomes available.  ?  ?Please do not hesitate to call with any hospice related questions.  ?  ?Thank you for the opportunity to participate in this patient's care. ?  ?Daphene Calamity, MSW ?[place] ?[phone] ? ?

## 2021-07-27 NOTE — Assessment & Plan Note (Addendum)
Comfort care measures and as needed medications prescribed.  Patient to be discharge to hospice home today. ?

## 2021-07-27 NOTE — Assessment & Plan Note (Signed)
BMI 37.88 ?

## 2021-07-27 NOTE — Assessment & Plan Note (Signed)
Patient currently on IV Vimpat, IV Keppra, IV phenobarbital and IV phenytoin.  As needed IV Ativan. ?

## 2021-07-27 NOTE — Progress Notes (Signed)
?  Progress Note ? ? ?Patient: Mariah Jimenez UVO:[id number] DOB: 04-04-1946 DOA: 07/19/2021     7 ?DOS: the patient was seen and examined on 07/27/2021 ?  ? ?Assessment and Plan: ?* End of life care ?Comfort care measures and as needed medications prescribed.  Patient awaiting hospice home bed. ? ?Seizure (HCC) ?Patient currently on IV Vimpat, IV Keppra, IV phenobarbital and IV phenytoin.  As needed IV Ativan. ? ?Acute metabolic encephalopathy ?Likely secondary from seizure. ? ?Hypotension ?Last blood pressure 113/54 ? ?Obesity with a BMI of 37.88 ? ? ? ? ? ? ?  ?Subjective: Patient's lower lip is swollen.  Patient seen this morning.  Grimaced a little bit with sternal well but otherwise unresponsive. ? ?Physical Exam: ?Vitals:  ? 07/25/21 1147 07/26/21 0757 07/26/21 2011 07/27/21 0728  ?BP: 129/70 (!) 125/57 115/73 (!) 113/54  ?Pulse: 75 77 69 72  ?Resp: 19 20  18   ?Temp:  98.1 ?F (36.7 ?C)    ?TempSrc:      ?SpO2: 99% 100% 99% 100%  ?Weight:      ?Height:      ? ?Physical Exam ?HENT:  ?   Head: Normocephalic.  ?   Mouth/Throat:  ?   Comments: Lower lip swollen. ?Eyes:  ?   General: Lids are normal.  ?   Conjunctiva/sclera: Conjunctivae normal.  ?Cardiovascular:  ?   Rate and Rhythm: Normal rate and regular rhythm.  ?   Heart sounds: Normal heart sounds, S1 normal and S2 normal.  ?Pulmonary:  ?   Breath sounds: Examination of the right-lower field reveals decreased breath sounds. Examination of the left-lower field reveals decreased breath sounds. Decreased breath sounds present. No wheezing, rhonchi or rales.  ?Abdominal:  ?   Palpations: Abdomen is soft.  ?   Tenderness: There is no abdominal tenderness.  ?Musculoskeletal:  ?   Right lower leg: Swelling present.  ?   Left lower leg: Swelling present.  ?Skin: ?   General: Skin is warm.  ?   Findings: No rash.  ?Neurological:  ?   Mental Status: She is unresponsive.  ?  ?Data Reviewed: ?No further lab draws since 07/22/2021. ? ?Family Communication: Updated patient's  sister on the phone ? ?Disposition: ?Status is: Inpatient ?Remains inpatient appropriate because: Hospice home awaiting IV seizure medications prior to excepting.  Hopefully will receive those medications today and take for transfer to the hospice home tomorrow. ? ?Planned Discharge Destination: Hospice home ? ?Author: ?09/21/2021, MD ?07/27/2021 3:12 PM ? ?For on call review [email or web address]/[email or web address]/2021.  ?

## 2021-07-27 NOTE — Assessment & Plan Note (Addendum)
Last blood pressure 107/51 ?

## 2021-07-27 NOTE — Care Management Important Message (Signed)
Important Message ? ?Patient Details  ?Name: Mariah Jimenez ?MRN: [id number] ?Date of Birth: 04-May-1945 ? ? ?Medicare Important Message Given:  Other (see comment) ? ?On comfort care measures.  Medicare IM withheld at this time out of respect for patient and family. ? ? ?Johnell Comings ?07/27/2021, 10:42 AM ?

## 2021-07-28 DIAGNOSIS — I959 Hypotension, unspecified: Secondary | ICD-10-CM | POA: Diagnosis not present

## 2021-07-28 DIAGNOSIS — G9341 Metabolic encephalopathy: Secondary | ICD-10-CM | POA: Diagnosis not present

## 2021-07-28 DIAGNOSIS — R569 Unspecified convulsions: Secondary | ICD-10-CM | POA: Diagnosis not present

## 2021-07-28 DIAGNOSIS — Z515 Encounter for palliative care: Secondary | ICD-10-CM | POA: Diagnosis not present

## 2021-07-28 MED ORDER — MORPHINE SULFATE (PF) 2 MG/ML IV SOLN
1.0000 mg | INTRAVENOUS | 0 refills | Status: AC | PRN
Start: 2021-07-28 — End: ?

## 2021-07-28 MED ORDER — SODIUM CHLORIDE 0.9 % IV SOLN: 100.0000 mg | Freq: Two times a day (BID) | INTRAVENOUS | Status: AC

## 2021-07-28 MED ORDER — SODIUM CHLORIDE 0.9 % IV SOLN
50.0000 mg | Freq: Two times a day (BID) | INTRAVENOUS | Status: AC
Start: 2021-07-28 — End: ?

## 2021-07-28 MED ORDER — ORAL CARE MOUTH RINSE
15.0000 mL | Freq: Two times a day (BID) | OROMUCOSAL | 0 refills | Status: AC
Start: 2021-07-28 — End: ?

## 2021-07-28 MED ORDER — GLYCOPYRROLATE 0.2 MG/ML IJ SOLN: 0.2000 mg | INTRAMUSCULAR | Status: AC | PRN

## 2021-07-28 MED ORDER — PHENOBARBITAL SODIUM 65 MG/ML IJ SOLN: 32.4000 mg | Freq: Three times a day (TID) | INTRAMUSCULAR | Status: AC

## 2021-07-28 MED ORDER — LORAZEPAM 2 MG/ML IJ SOLN
2.0000 mg | INTRAMUSCULAR | 0 refills | Status: AC | PRN
Start: 2021-07-28 — End: ?

## 2021-07-28 MED ORDER — LEVETIRACETAM IN NACL 1500 MG/100ML IV SOLN: 1500.0000 mg | Freq: Two times a day (BID) | INTRAVENOUS | Status: AC

## 2021-07-28 MED ORDER — BLISTEX MEDICATED EX OINT: 1.0000 "application " | TOPICAL_OINTMENT | CUTANEOUS | Status: AC | PRN

## 2021-07-28 NOTE — Progress Notes (Signed)
PICC line saline locked. Report given to Judeth Cornfield, RN at this time. Patient ready for EMS when they arrived. Sister, [name] notified by this RN.  ?

## 2021-07-28 NOTE — Discharge Summary (Signed)
?Physician Discharge Summary ?  ?Patient: Mariah Jimenez MRN: [id number] DOB: 07/07/1945  ?Admit date:     07/19/2021  ?Discharge date: 07/28/21  ?Discharge Physician: Alford Highland  ? ?PCP: Keane Police, MD  ? ?Recommendations at discharge:  ? ? Follow up hospice home 1 day ? ?Discharge Diagnoses: ?Principal Problem: ?  End of life care ?Active Problems: ?  Seizure (HCC) ?  Acute metabolic encephalopathy ?  Obesity (BMI 30-39.9) ?  Hypotension ? ? ? ?Hospital Course: ?ICU transfer 07/24/2021. ? ?76 yo F presenting to [facility] ED from [facility] via EMS on 07/19/21 with complaints of AMS. Patient's niece is bedside providing patient's history. Per niece, the patient is able to talk at baseline but at times is difficult to understand. She is able to sit up and feed herself as well. She is bed bound but has been assisted with equipment out of bed recently. Per her niece family noticed a change around 3/31-4/2 when the patient became extremely lethargic and non interactive. She was then hospitalized on 07/14/21-07/17/21 with seizure activity at her SNF and subsequently discovered UTI, this resolved with admission and Cefdinir completed on Sat 4/8. ?  ?After her recent discharge, patient remained more lethargic then baseline and it is unclear how reliably she has been getting her PO medications, including seizure medications. Per SNF staff they have been crushing them in applesauce and placing them under her tongue. Per the niece, facility staff has alluded to "trying multiple times to give meds". Visible white residue noted on the patient's bottom lip- possibly from recent medication attempts. EMS was called due to family's concerns of extreme lethargy. No complaints of recent seizure activity after recent discharge. There was previous documentation that the patient's tongue/lip might be swollen-clarified with niece, [name], that this is not the case and her tongue/bottom lip are at baseline. While in the  room patient began to have what appeared to be a partial localized seizure activity involving rhythmic tremors in her face. Niece, [name], confirmed that this is how her aunt's seizures present, ending with a state appearing like deep sleep with snoring. Ativan 2 mg x 2 given with cessation of seizure like activity. Bolus of Keppra IV ordered to catch patient up on missed AED.  ? ?She was admitted to ICU with hypotension with suspected hypovolemia versus myxedema coma, requiring vasopressor support.  Mental status remained unchanged and she was most of the time unresponsive except to deep sternal rub. ?Patient continued to have multiple episodes concerning for focal seizures.  No continuous EEG monitoring available at [facility].  Neurology is recommending continuation of Vimpat, Keppra, phenobarb and phenytoin given with comfort care. ?Patient remained encephalopathic with no reversible cause found. ? ?Palliative care was consulted and family decided to move to comfort care only. ?Patient needs IV antiepileptics per neurology as she started seizing on stopping those medications. ? ?Patient to be transferred to hospice home today 07/28/2021. ?  ?  ? ?Assessment and Plan: ?* End of life care ?Comfort care measures and as needed medications prescribed.  Patient will transfer to hospice home today ?  ?Seizure (HCC) ?Patient currently on IV Vimpat, IV Keppra, IV phenobarbital and IV phenytoin.  As needed IV Ativan. ?  ?Acute metabolic encephalopathy ?Likely secondary from seizure. ?  ?Hypotension ?Last blood pressure 107/51 ?  ?Obesity with a BMI of 37.88 ? ? ?  ? ? ?Consultants: hospice, neurology ?Procedures performed: none  ?Disposition: Hospice care ?Diet recommendation:  ?npo ?DISCHARGE MEDICATION: ?Allergies as  of 07/28/2021   ?No Known Allergies ?  ? ?  ?Medication List  ?  ? ?STOP taking these medications   ? ?cefdinir 300 MG capsule ?Commonly known as: OMNICEF ?  ?diazepam 5 MG tablet ?Commonly known as: VALIUM ?   ?ipratropium-albuterol 0.5-2.5 (3) MG/3ML Soln ?Commonly known as: DUONEB ?  ?lamoTRIgine 150 MG tablet ?Commonly known as: LAMICTAL ?  ?lamoTRIgine 25 MG tablet ?Commonly known as: LAMICTAL ?  ?levETIRAcetam 750 MG tablet ?Commonly known as: KEPPRA ?  ?loratadine 10 MG tablet ?Commonly known as: CLARITIN ?  ?multivitamin with minerals Tabs tablet ?  ?pantoprazole 40 MG tablet ?Commonly known as: PROTONIX ?  ?PHENobarbital 32.4 MG tablet ?Commonly known as: LUMINAL ?  ?phenytoin 300 MG ER capsule ?Commonly known as: DILANTIN ?  ?SPS 15 GM/60ML suspension ?Generic drug: sodium polystyrene ?  ?Vimpat 50 MG Tabs tablet ?Generic drug: lacosamide ?  ? ?  ? ?TAKE these medications   ? ?glycopyrrolate 0.2 MG/ML injection ?Commonly known as: ROBINUL ?Inject 1 mL (0.2 mg total) into the vein every 4 (four) hours as needed (excessive secretions). ?  ?lacosamide 50 mg in sodium chloride 0.9 % 25 mL ?Inject 50 mg into the vein every 12 (twelve) hours. ?  ?levETIRacetam 1500 MG/100ML Soln ?Commonly known as: KEPPRA ?Inject 100 mLs (1,500 mg total) into the vein every 12 (twelve) hours. ?  ?lip balm Oint ?Apply 1 application. topically as needed for lip care. ?  ?LORazepam 2 MG/ML injection ?Commonly known as: ATIVAN ?Inject 1 mL (2 mg total) into the vein every hour as needed for seizure. ?What changed:  ?how much to take ?how to take this ?when to take this ?reasons to take this ?additional instructions ?Another medication with the same name was removed. Continue taking this medication, and follow the directions you see here. ?  ?morphine (PF) 2 MG/ML injection ?Inject 0.5-1 mLs (1-2 mg total) into the vein every hour as needed (or dyspnea). ?  ?mouth rinse Liqd solution ?15 mLs by Mouth Rinse route 2 times daily at 12 noon and 4 pm. ?  ?PHENObarbital 65 MG/ML injection ?Commonly known as: LUMINAL ?Inject 0.5 mLs (32.4 mg total) into the vein 3 (three) times daily. ?  ?phenytoin 100 mg in sodium chloride 0.9 % 100 mL ?Inject  100 mg into the vein 2 (two) times daily. ?  ? ?  ? ? Follow-up Information   ? ? hospice home Follow up in 1 day(s).   ? ?  ?  ? ?  ?  ? ?  ? ?Discharge Exam: ?Filed Weights  ? 07/20/21 0151 07/21/21 0500 07/22/21 0500  ?Weight: 113.3 kg 113.1 kg 113 kg  ? ?Physical Exam ?HENT:  ?   Head: Normocephalic.  ?   Mouth/Throat:  ?   Comments: Lower lip swollen ?Eyes:  ?   General: Lids are normal.  ?   Conjunctiva/sclera: Conjunctivae normal.  ?Cardiovascular:  ?   Rate and Rhythm: Normal rate and regular rhythm.  ?   Heart sounds: Normal heart sounds, S1 normal and S2 normal.  ?Pulmonary:  ?   Breath sounds: Examination of the right-lower field reveals decreased breath sounds. Examination of the left-lower field reveals decreased breath sounds. Decreased breath sounds present. No wheezing, rhonchi or rales.  ?Abdominal:  ?   Palpations: Abdomen is soft.  ?   Tenderness: There is no abdominal tenderness.  ?Musculoskeletal:  ?   Right lower leg: Swelling present.  ?   Left lower leg:  Swelling present.  ?Skin: ?   General: Skin is warm.  ?   Findings: No rash.  ?Neurological:  ?   Mental Status: She is lethargic.  ?   Comments: Patient moaning this am  ?  ? ?Condition at discharge: gaurded ? ?The results of significant diagnostics from this hospitalization (including imaging, microbiology, ancillary and laboratory) are listed below for reference.  ? ?Imaging Studies: ?CT HEAD WO CONTRAST ( ) ? ?Result Date: 07/21/2021 ?CLINICAL DATA:  76 year old female with seizure disorder. Acute on chronic encephalopathy. EXAM: CT HEAD WITHOUT CONTRAST TECHNIQUE: Contiguous axial images were obtained from the base of the skull through the vertex without intravenous contrast. RADIATION DOSE REDUCTION: This exam was performed according to the departmental dose-optimization program which includes automated exposure control, adjustment of the mA and/or kV according to patient size and/or use of iterative reconstruction technique.  COMPARISON:  07/19/2021 head CT and earlier. FINDINGS: Brain: No midline shift, ventriculomegaly, mass effect, evidence of mass lesion, intracranial hemorrhage or evidence of cortically based acute infarction. Chronic

## 2021-07-28 NOTE — TOC Progression Note (Signed)
Transition of Care (TOC) - Progression Note  ? ? ?Patient Details  ?Name: Mariah Jimenez ?MRN: [id number] ?Date of Birth: 1945/09/19 ? ?Transition of Care (TOC) CM/SW Contact  ?Laurena Slimmer, RN ?Phone Number: ?07/28/2021, 2:40 PM ? ?Clinical Narrative:    ?Confirmed hospice bed available for discharge. EMS arranged nurse notified. EMS packet on chart. TOC signing off  ? ? ?Expected Discharge Plan: [place] ?Barriers to Discharge: Continued Medical Work up ? ?Expected Discharge Plan and Services ?Expected Discharge Plan: [place] ?  ?Discharge Planning Services: CM Consult ?Post Acute Care Choice: Resumption of Svcs/PTA Provider, [place] ?Living arrangements for the past 2 months: [place] ?Expected Discharge Date: 07/28/21               ?DME Arranged: N/A ?DME Agency: NA ?  ?  ?  ?HH Arranged: NA ?[place] Agency: NA ?  ?  ?  ? ? ?Social Determinants of Health (SDOH) Interventions ?  ? ?Readmission Risk Interventions ?   ? View : No data to display.  ?  ?  ?  ? ? ?

## 2021-07-28 NOTE — Progress Notes (Signed)
[facility]  ? ?Consent forms have been completed. ? ?EMS notified of patient D/C and transport arranged by TOC/[place]. Attending Physician/Dr. Renae Gloss also notified of transport arrangement.  ?  ?Please send signed DNR form with patient and RN call report to [phone].  ?  ?Odette Fraction, MSW ?[facility] Hospital Liaison ?[phone] ? ?

## 2021-08-10 DEATH — deceased
